# Patient Record
Sex: Female | Born: 1985 | Race: Black or African American | Hispanic: No | Marital: Single
Health system: Midwestern US, Community
[De-identification: ages and names within clinical notes are randomized; demographics above are authoritative.]

## PROBLEM LIST (undated history)

## (undated) ENCOUNTER — Ambulatory Visit: Payer: Self-pay | Source: Home / Self Care

## (undated) DIAGNOSIS — K219 Gastro-esophageal reflux disease without esophagitis: Secondary | ICD-10-CM

## (undated) DIAGNOSIS — D649 Anemia, unspecified: Secondary | ICD-10-CM

## (undated) DIAGNOSIS — O26849 Uterine size-date discrepancy, unspecified trimester: Secondary | ICD-10-CM

## (undated) DIAGNOSIS — R079 Chest pain, unspecified: Secondary | ICD-10-CM

## (undated) DIAGNOSIS — J209 Acute bronchitis, unspecified: Secondary | ICD-10-CM

## (undated) HISTORY — PX: CHOLECYSTECTOMY: SHX55

## (undated) HISTORY — PX: OTHER SURGICAL HISTORY: SHX169

---
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
## 2007-01-13 NOTE — Op Note (Signed)
[facility]                                OPERATION REPORT                        SURGEON:  JAVAID A. Birder Robson, M.D.   [facility] [name], [name]:   MR  [id number]                DATE OF SURGERY:                     01/13/2007   #:   Ashley [name]  [id number]             PT. LOCATION:                        [id number]   #   JAVAID A. Birder Robson, M.D.      DOB: December 08, 1985        AGE:21        SEX:  F   cc:    JAVAID A. Birder Robson, M.D.   PREOPERATIVE DIAGNOSIS:   Term pregnancy.  Previous cesarean section.   POSTOPERATIVE DIAGNOSIS:   Term pregnancy.  Previous cesarean section.  Pelvic adhesions.   OPERATION:   Repeat cesarean section and lysis of adhesions.   SURGEON:   Titus Mould, M.D.   ANESTHESIA:   Spinal.   PROCEDURE AND FINDINGS:  With the patient in supine position under   effective regional anesthesia, having inserted a Foley catheter, she was   prepped and draped in a sterile manner for abdominal procedure.  Through a   transverse suprapubic skin incision, the peritoneal cavity was entered and   patient was noticed to have extensive anterior abdominal wall omental   adhesions which were all carefully freed.  Bladder flap was mobilized and   through a lower uterine segment transverse incision, a live infant was   delivered with forceps in good condition.  The placental membranes were   manually removed, and the uterine cavity was cleaned.  The uterine incision   was closed in 2 layers of running interlocking 0 chromic suture securing,   adequate hemostasis.  After that, the bladder flap was reapproximated.  The   pelvis was cleaned and the abdominal wall incision was closed in layers   using running 0 chromic for the peritoneum and running interlocking 0   Vicryl for the fascia, and subcutaneous hemostasis was secured with   cautery.  Skin edges were approximated with staples.  Blood loss was   estimated at less than 300 cc.  No intraoperative complications were   encountered.  All counts were reported  correct.  A sterile dressing was   applied on the incision and patient was transferred to recovery room for   further observation and treatment with a Foley catheter draining clear   adequate urine.   Electronically Signed By:   Lily Lovings. Birder Robson, M.D. 01/26/2007 09:26   ____________________________   Jerre Simon A. Birder Robson, M.D.   Turner Daniels  D:  01/13/2007  T:  01/13/2007  1:31 P   [id number]

## 2007-01-13 NOTE — Op Note (Signed)
[facility]                                OPERATION REPORT                        SURGEON:  JAVAID A. Birder Robson, M.D.   [facility] [name], ANTHONELLA KLAUSNER:   MR  [id number]                DATE OF SURGERY:                     01/13/2007   #:   Lindley Magnus  [id number]             PT. LOCATION:                        [id number]   #   JAVAID A. Birder Robson, M.D.      DOB: 14-Nov-1985        AGE:21        SEX:  F   cc:    JAVAID A. Birder Robson, M.D.   PREOPERATIVE DIAGNOSIS:   Term pregnancy.  Previous cesarean section.   POSTOPERATIVE DIAGNOSIS:   Term pregnancy.  Previous cesarean section.  Pelvic adhesions.   OPERATION:   Repeat cesarean section and lysis of adhesions.   SURGEON:   Titus Mould, M.D.   ANESTHESIA:   Spinal.   PROCEDURE AND FINDINGS:  With the patient in supine position under   effective regional anesthesia, having inserted a Foley catheter, she was   prepped and draped in a sterile manner for abdominal procedure.  Through a   transverse suprapubic skin incision, the peritoneal cavity was entered and   patient was noticed to have extensive anterior abdominal wall omental   adhesions which were all carefully freed.  Bladder flap was mobilized and   through a lower uterine segment transverse incision, a live infant was   delivered with forceps in good condition.  The placental membranes were   manually removed, and the uterine cavity was cleaned.  The uterine incision   was closed in 2 layers of running interlocking 0 chromic suture securing,   adequate hemostasis.  After that, the bladder flap was reapproximated.  The   pelvis was cleaned and the abdominal wall incision was closed in layers   using running 0 chromic for the peritoneum and running interlocking 0   Vicryl for the fascia, and subcutaneous hemostasis was secured with   cautery.  Skin edges were approximated with staples.  Blood loss was   estimated at less than 300 cc.  No intraoperative complications were    encountered.  All counts were reported correct.  A sterile dressing was   applied on the incision and patient was transferred to recovery room for   further observation and treatment with a Foley catheter draining clear   adequate urine.   Electronically Signed By:   Lily Lovings. Birder Robson, M.D. 01/26/2007 09:26   ____________________________   Jerre Simon A. Birder Robson, M.D.   Turner Daniels  D:  01/13/2007  T:  01/13/2007  1:31 P   [id number]

## 2010-11-25 LAB — PAP SMEAR
PAP Smear, External: NEGATIVE
Pap smear, External: NEGATIVE

## 2010-11-25 LAB — N GONORRHOEAE, DNA PROBE: Gonorrhea, External: NEGATIVE

## 2010-11-25 LAB — CHLAMYDIA DNA PROBE: Chlamydia, External: NEGATIVE

## 2010-11-25 LAB — AMB POC URINE PREGNANCY TEST, VISUAL COLOR COMPARISON: HCG urine, Ql. (POC): NEGATIVE

## 2010-11-25 NOTE — Progress Notes (Signed)
Subjective:   25 y.o. female for annual routine Pap and checkup.  Patient's last menstrual period was 10/21/2010.    Social History: single partner, contraception - none.  Pertinent past medical hstory: no history of HTN, DVT, CAD, DM, liver disease, migraines or smoking.    There is no problem list on file for this patient.    Past Surgical History   Procedure Date   ??? Hx cesarean section 2002, 2004, 2008     x 3   ??? 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 No dyspnea or chest pain on exertion.  No abdominal pain, change in bowel habits, black or bloody stools.  No urinary tract symptoms. GYN ROS: normal menses, no abnormal bleeding, pelvic pain or discharge, no breast pain or new or enlarging lumps on self exam. No neurological complaints.    Objective:   BP 108/61   Pulse 81   Resp 18   Ht 5' (1.524 m)   Wt 178 lb (80.74 kg)   BMI 34.76 kg/m2   LMP 10/21/2010  The patient appears well, alert, oriented x 3, in no distress.  ENT normal.  Neck supple. No adenopathy or thyromegaly. PERLA. Lungs are clear, good air entry, no wheezes, rhonchi or rales. S1 and S2 normal, no murmurs, regular rate and rhythm. Abdomen soft without tenderness, guarding, mass or organomegaly. Extremities show no edema, normal peripheral pulses. Neurological is normal, no focal findings.    BREAST EXAM: breasts appear normal, no suspicious masses, no skin or nipple changes or axillary nodes    PELVIC EXAM: normal external genitalia, vulva, vagina, cervix, uterus and adnexa, VULVA: normal appearing vulva with no masses, tenderness or lesions, VAGINA: normal appearing vagina with normal color and discharge, no lesions, CERVIX: normal appearing cervix without discharge or lesions, no discharge noted, cervical motion tenderness absent, UTERUS: uterus is normal size, shape, consistency and nontender, ADNEXA: normal adnexa in size, nontender and no masses    Assessment/Plan:   well woman  pap smear   counseled on breast self exam, STD prevention, HIV risk factors and prevention and family planning choices  return annually or prn  1. Routine gynecological examination  PAP, IG, RFX HPV ASCUS ([id number])   2. Screening examination for venereal disease  CT, NG, TRICH VAG BY NAA   3. Missed menses  AMB POC URINE PREGNANCY TEST, VISUAL COLOR COMPARISON   The patient desires conception.  Discussed fertile days in the cycle.  Recommend confirm ovulation with ovulation kits with timed intercourse.  F/U 1 year or as needed.

## 2010-11-28 LAB — CT/NG/T.VAGINALIS AMPLIFICATION
C. trachomatis by NAA: NEGATIVE
N. gonorrhoeae by NAA: NEGATIVE
T. vaginalis by NAA: NEGATIVE

## 2010-12-03 LAB — PAP, IG, RFX HPV ASCUS (507301)
.: 0
LABCORP 019018: 0

## 2010-12-11 NOTE — ED Provider Notes (Signed)
MEDICATION ADMINISTRATION SUMMARY              Drug Name: Acetaminophen, Dose Ordered: 975 mg, Route: Oral, Status:         Given, Time: 09:21 12/11/2010, Detailed record available in Medication         Service section.       KNOWN ALLERGIES   NSAIDS       TRIAGE (Sat Dec 11, 2010 08:38 MML1)   PATIENT: NAME: Ashley [name], AGE: 25, GENDER: female, DOB:         Thu 12-05-1985, TIME OF GREET: Sat Dec 11, 2010 08:33, SSN:         [id number], KG WEIGHT: 73.9, HEIGHT: 152cm, MEDICAL RECORD NUMBER:         [phone], ACCOUNT NUMBER: [id number], PCP: Pt Denies,. (Sat Dec 11, 2010         08:38 MML1)   ADMISSION: URGENCY: 3, TRANSPORT: Ambulatory, DEPT: Emergency,         BED: 2ED 35. (Sat Dec 11, 2010 08:38 MML1)   VITAL SIGNS: BP 113/75, Pulse 76, Resp 18, Temp 98.8, (Oral),         Pain 6, O2 Sat 1000, on Room air, Time 12/11/2010 08:34. (08:34         MML1)   COMPLAINT:  [redacted] Weeks Pregnant Crampin. (Sat Dec 11, 2010 08:38         MML1)   PRESENTING COMPLAINT:  [redacted] weeks pregnant with vaginal bldg,         started 2 days ago, cramps getting worse. (Sat Dec 11, 2010 08:38         MML1)   PAIN: Patient complains of pain, On a scale 0-10 patient rates         pain as 6, cramping, Pain is constant. (Sat Dec 11, 2010 08:38         MML1)   LMP: Last menstrual period: 09-27-2010, Estimated conception         10/11/2010, Estimated [name] date 07/04/2011, Estimated fetal age 61         weeks, 5 days, G: 4, P: 3. (Sat Dec 11, 2010 08:38 MML1)   TB SCREENING: TB screen negative for this patient. (Sat Dec 11, 2010 08:38 MML1)   ABUSE SCREENING: Patient denies physical abuse or threats. (Sat         Dec 11, 2010 08:38 MML1)   FALL RISK: Fall risk assessment not applicable to this patient.         (Sat Dec 11, 2010 08:38 MML1)   SUICIDAL IDEATION: Not Applicable. (Sat Dec 11, 2010 08:38         MML1)   ADVANCE DIRECTIVES: Patient does not have advance directives.         (Sat Dec 11, 2010 08:38 MML1)    PROVIDERS: TRIAGE NURSE: Lanny Hurst, RN. (Sat Dec 11, 2010         08:38 MML1)       PRESENTING PROBLEM (Sat Dec 11, 2010 08:38 MML1)      Presenting problems: Vaginal Bleed - Pregnant.       CURRENT MEDICATIONS (08:39 MML1)   Prenatal Multivitamin:  1 tab(s) Oral once a day.   Multivitamin:  1 tab(s) Oral once a day.       MEDICATION SERVICE (09:21 BAF)   Acetaminophen:  Order: Acetaminophen - Dose: 975 mg :  Oral         Ordered by: Dixie Dials, MD         Entered by: Dixie Dials, MD Sat Dec 11, 2010 09:16 ,          Acknowledged by: Clent Demark, RN Sat Dec 11, 2010 09:18         Documented as given by: Clent Demark, RN Sat Dec 11, 2010 09:21          Patient, Medication, Dose, Route and Time verified prior to         administration.          Time given: 0920, Amount given: 975mg , Site: Medication administered         P.O., Correct patient, time, route, dose and medication confirmed         prior to administration, Patient advised of actions and side-effects         prior to administration, Allergies confirmed and medications reviewed         prior to administration, Patient tolerated procedure well, Patient in         position of comfort, Side rails up, Cart in lowest position, Family         at bedside, Call light in reach.       ORDERS   Urine HCG:  Ordered for: Carmela Hurt, MD, Ben         Status: Done by Ronnell Guadalajara RN, [facility] Dec 11, 2010 09:21. (08:42         [facility])   Urine dip (send for lab U/A if positive):  Ordered for:         Carmela Hurt, MD, Romeo Apple         Status: Done by Ronnell Guadalajara RN, [facility] Dec 11, 2010 09:21. (08:42         [facility])   ULT UTERUS OB TRANSVAGINAL:  Ordered for: Carmela Hurt, MD, Ben         Status: Active. (09:16 BAF)   PREGNANCY, QUANTITATIVE:  Ordered for: Carmela Hurt, MD, Ben         Status: Done by System Sat Dec 11, 2010 10:36. (09:16 BAF)   Ultrasound has been ordered:  Ordered for: Carmela Hurt, MD, Romeo Apple          Status: Done by Skip Estimable Sat Dec 11, 2010 09:23. (09:16         BAF)       NURSING ASSESSMENT: GENITOURINARY (09:16 MAK1)   CONSTITUTIONAL: History obtained from patient, Patient arrives         ambulatory, Gait steady, Patient appears comfortable, Patient         cooperative, Patient alert, Oriented to person, place and time, Skin         warm, Skin dry, Skin normal in color, Mucous membranes pink, Mucous         membranes moist, Patient is well-groomed.   PAIN FEMALE: cramping pain, to the suprapubic region, constant,         on a scale 0-10 patient rates pain as 6.   GENITOURINARY FEMALE: Female genitourinary assessment findings         include external genitalia normal, Pregnant, per patient, Gestational         age 13-12 weeks by history, Last menstrual period started on         09/27/2010 09:16, Gravida: 4, Para: 3, Elective abortions: 0,         Spontaneous abortions: 0.   ABDOMEN: Abdomen assessment findings include  abdomen symmetrical,         Abdomen soft, Associated with nausea.   SAFETY: Side rails up, Cart/Stretcher in lowest position, Call         light within reach, Hospital ID band on.       NURSING ASSESSMENT: NURSES NOTE (11:00 MAK1)   TIME ASSESSED:  Patient in no apparent distress, Patient resting         quietly, Patient alert and oriented, breathing regular and unlabored,         skin warm and dry. pt denies needs at this time. will continue to         monitor.       NURSING PROCEDURE: BEDSIDE TESTING (09:19 MCT1)   PATIENT IDENTIFIER: Patient's identity verified by patient         stating name, Patient's identity verified by patient stating birth         date, Patient's identity verified by hospital ID bracelet.   PREGNANCY TEST: Pregnancy test indicated to document pregnancy,         Pregnancy test indicated to document pregnancy status prior to         procedures, Urine pregnancy test, positive, Quality control line         positive.        NURSING PROCEDURE: DISCHARGE NOTE (13:01 MAK1)   TIME:  Patient discharged to, home, ambulates without assistance,         Transported via friend/family driving, Accompanied by family member,         patient, Simple/moderate discharge teaching performed, Patient,         Discharge instructions given to, Above Person(s) verbalized         understanding of discharge instructions and follow-up care.       NURSING PROCEDURE: LAB DRAW (09:42 MCT1)   PATIENT IDENTIFIER: Patient's identity verified by patient         stating name, Patient's identity verified by patient stating birth         date, Patient's identity verified by hospital ID bracelet, Patient's         identity verified by family member, Patient actively involved in         identification process.   LAB DRAW: Lab draw indicated for obtaining specimens for         evaluation, Initial lab draw performed, by venipuncture, from right         antecubital, in one attempt, Labs were drawn at 0942, Lab specimens         labeled in the presence of the patient and sent to lab, Tourniquet         removed from patient after procedure.   FOLLOW-UP: After procedure, dressing applied to site, After         procedure, no swelling at site, After procedure, no active bleeding         from site.   NOTES: Patient tolerated procedure well.   SAFETY: Side rails up, Cart/Stretcher in lowest position, Family         at bedside, Call light within reach, Hospital ID band on.       NURSING PROCEDURE: NURSE NOTES (12:14 LEC1)   NURSES NOTES: Patient in no apparent distress, Patient resting         quietly, Notes: Patient denies needs or concerns. Patient sitting in         bedside chair. Patient's children sitting on bed.  NURSING PROCEDURE: TRANSPORT TO TESTS   PATIENT IDENTIFIER: Patient's identity verified by patient         stating name, Patient's identity verified by patient stating birth         date, Patient's identity verified by hospital ID bracelet. (11:14         MAK1)    TRANSPORT TO TESTS: Transport indicated to facilitate diagnosis,         Patient transported to ultrasound, via cart, Accompanied by x-ray         technician. (11:14 MAK1)   FOLLOW-UP: After procedure, patient returned to emergency         department. (11:39 MAK1)   SAFETY: Side rails up, Cart/Stretcher in lowest position, Family         at bedside, Call light within reach, Hospital ID band on. (11:14         MAK1)       DIAGNOSIS (12:34 BAF)   FINAL: PRIMARY: Threatened miscarriage.       DISPOSITION   PATIENT:  Disposition Type: Discharged, Disposition: Discharged,         Condition: Stable. (12:34 BAF)      Patient left the department. (13:03 MAK1)       INSTRUCTION (12:35 BAF)   DISCHARGE:  ECTOPIC PREGNANCY, SUSPECTED - W/ REPEAT QUANT AND         ULTRASOUND (TUBAL PREGNANCY).   FOLLOWUP:  Val Eagle, , MEDICINE.   SPECIAL:  Please call your ob/gyn on Monday morning. They have         agreed to see you on Monday and will repeat your labwork then. Dr.         Andi Hence and I have spoken and he understands the situation. Return for         increased pain, heavy vaginal bleeding, or other concerns.   Key:     BAF=Fickenscher, MD, Romeo Apple  JOHO=Hubbard, PA-C, Amil Amen  LEC1=Cutchins, RN,     Raliegh Scarlet     MAK1=Knice, RN, Marcelino Duster  MCT1=Todd, ACT III, Casimiro Needle  MML1=Lopez, RN,     [facility]

## 2010-12-11 NOTE — ED Provider Notes (Signed)
[facility]   EMERGENCY DEPARTMENT TREATMENT REPORT   NAME:  Ashley [name]   SEX:   F   ADMIT: 12/11/2010   DOB:   12/23/1985   MR#    [id number]   ROOM:     TIME SEEN: 12 43 PM   ACCT#  [id number]               COMPLAINT:   Vaginal bleeding and abdominal cramping.       HISTORY OF PRESENT ILLNESS:   A 25 year old female who is 10 weeks 5 days by dates.  She states that    yesterday she started having spotting coupled with bilateral abdominal    cramping.  Its pain as a 6 out of 10.  It is intermittent cramping sensation.     She has not taken any medicine for it.  She has had no passage of clots or    heavy bleeding.  She states also that she has had a decrease in her symptoms    of pregnancy.  She called her doctor's office, Dr. Doyne Keel who indicated she    should come to the Emergency Department.       REVIEW OF SYSTEMS:   PULMONARY:  No shortness of breath or cough.   CARDIOVASCULAR:  No chest pain.   GENITOURINARY:  No dysuria.       PAST MEDICAL HISTORY:   None.       SOCIAL HISTORY:   No alcohol, tobacco or drug use.       MEDICATIONS:   Please see [facility].       ALLERGIES:   NSAIDs.       PHYSICAL EXAMINATION:   VITAL SIGNS:  Blood pressure 118/67, pulse 71, respiratory rate 18,    temperature 98.8, O2 sat 100% on room air.   GENERAL APPEARANCE:  Patient appears well developed and well nourished.     Appearance and behavior are age and situation appropriate.   RESPIRATORY:  Clear and equal breath sounds.  No respiratory distress,    tachypnea, or accessory muscle use.     CARDIOVASCULAR:   Heart regular, without murmurs, gallops, rubs, or thrills.         GI:  Her abdomen is soft and nontender throughout:     Pelvic exam:  She has no uterine tenderness, no adnexal tenderness.   SKIN:  Warm and dry.       INITIAL ASSESSMENT:   A patient with a bleeding and pain in early pregnancy.  Differential would    include normal pregnancy with uterine irritability versus threatened     miscarriage, ectopic pregnancy.   This is a new problem for this patient.    Old records were reviewed.  No additional relevant information was obtained.   Bedside ultrasound shows no identifiable fetal pole but a gestational sac.     She was sent for formal ultrasound.  Ultrasound formal shows 1.6 cm    hyperechoic left ovarian mass with possible ectopic pregnancy and 6.2 weeks'    gestational sac without intrauterine fetus.  Beta hCG is 10,345.  Urinalysis    is negative for signs of infection.       MEDICAL DECISION MAKING AND HOSPITAL COURSE:   The patient was given oral Tylenol.  Her pain came down to 1.  I have had    multiple discussions with her.  I spoke to Dr. Lysle Dingwall, on call for her OB/GYN.     He feels that  it is safe for her to go home and commits to seeing the patient    on Monday for repeat beta hCG testing and/or repeat imaging.  I have    confirmed that her blood type is A positive.  I have sent her with a copy of    her labs.  She understand to return immediately for heavy bleeding, increased    pain or other concerns.       DISPOSITION:   Home in stable condition.       DIAGNOSES:   1. Acute abdominal pain.   2. Threatened miscarriage   3. Possible ectopic pregnancy.           ___________________   Christiana Pellant MD   Dictated By: Marland Kitchen        My signature above authenticates this document and my orders, the final    diagnosis (es), discharge prescription (s), and instructions in the [facility]    Pulsecheck record.   CL   D:12/11/2010   T: 12/11/2010 17:03:05   [id number]

## 2010-12-11 NOTE — Telephone Encounter (Signed)
paged 12/11/10 at 12:27 PM "Dr thicker at [facility] er re pt jenette Bubel 10 wks"   "Cramping, no bleeding, 6 week IUP (sac, no fetal pole), QBHCG 10,000, Rh+."    Recommended bed/pelvic rest, call office for appointment; go to nearest hospital for bleeding.

## 2010-12-17 DIAGNOSIS — O34219 Maternal care for unspecified type scar from previous cesarean delivery: Secondary | ICD-10-CM | POA: Insufficient documentation

## 2010-12-20 MED ORDER — FOLIC ACID 1 MG TAB
1 mg | ORAL_TABLET | Freq: Every day | ORAL | Status: DC
Start: 2010-12-20 — End: 2016-08-24

## 2010-12-20 MED ORDER — FERROUS SULFATE 325 MG (65 MG ELEMENTAL IRON) TAB
325 mg (65 mg iron) | ORAL_TABLET | Freq: Every day | ORAL | Status: DC
Start: 2010-12-20 — End: 2015-01-07

## 2010-12-20 MED ORDER — ONDANSETRON 4 MG TAB, RAPID DISSOLVE
4 mg | ORAL_TABLET | Freq: Three times a day (TID) | ORAL | Status: DC | PRN
Start: 2010-12-20 — End: 2016-07-26

## 2010-12-20 NOTE — Progress Notes (Signed)
25 y.o. Patient's last menstrual period was 10/21/2010. G4   P3003      Chief Complaint   Patient presents with   ??? Initial Prenatal Visit    C/S x 3    Denies pain/spotting. +Hyperemesis  Seen at [facility] : Pt reports very early IUP with QBHCG 10,000 on 11/10/10 :Request Records    No past medical history on file.   Past Surgical History   Procedure Date   ??? Hx cesarean section 2002, 2004, 2008     x 3   ??? Hx gastric bypass 10-2009     [facility]      Past Surgical History   Procedure Date   ??? Hx cesarean section 2002, 2004, 2008     x 3   ??? Hx gastric bypass 10-2009     [facility]     No past medical history on file.  Sickle Cell trait.    Consort 25 yo healthy & healthy -no Fhx sickle or or inherited illness  OB History     Grav Para Term Preterm Abortions TAB SAB Ect Mult Living    4 3 3       3       C/S x 3 1st for fetal intolerance of labor & 2 repeat.     Allergies   Allergen Reactions   ??? Aspirin Other (comments)     Stomach ulcer   ??? Ibuprofen Unknown (comments)     Gastric Bypass--advised to avoid     .  Current Outpatient Prescriptions on File Prior to Visit   Medication Sig Dispense Refill   ??? multivitamin (ONE A DAY) tablet Take 1 Tab by mouth daily.         ??? ERGOCALCIFEROL, VITAMIN D2, (VITAMIN D2 PO) Take  by mouth.         ??? cyanocobalamin (B-12 DOTS) 500 mcg tablet Take 500 mcg by mouth daily.            Gardasil Positive first 2 of series  indicated that her mother is alive. She indicated that her father is alive. She indicated that her sister is alive. She indicated that her brother is alive. She indicated that her maternal grandmother is alive. She indicated that her maternal grandfather is deceased. She indicated that her paternal grandmother is deceased. She indicated that her paternal grandfather is deceased.    GYN History   Menarche 8 years X  28 days X 5 days        History     Social History   ??? Marital Status: Single     Spouse Name: N/A     Number of Children: N/A    ??? Years of Education: N/A     Occupational History   ??? Not on file.     Social History Main Topics   ??? Smoking status: Former Smoker     Types: Cigarettes     Quit date: 11/12/2009   ??? Smokeless tobacco: Never Used   ??? Alcohol Use: No   ??? Drug Use: No   ??? Sexually Active: Yes -- Female partner(s)     Birth Control/ Protection: None     Other Topics Concern   ??? Not on file     Social History Narrative   ??? No narrative on file     coitarche 15 X 6 single partner, contraception - none.  Monogamous Yes X 1 year    Negative Herpes Negative GC Positive Chlamydia  Tx'd at 25 yo, Negative T. Vaginalis Negative Syphilis    Fertility Treatment(s) Negative    Prior Pap  Approximate date, Result:normal   Previous Biopsy No  Treatment: NA  BP 110/62   Pulse 66   Resp 18   Ht 5' (1.524 m)   Wt 174 lb (78.926 kg)   BMI 33.98 kg/m2   LMP 08/09/2012Recent exam (3weeks ago) Exam deferred.    1. Supervision of other normal pregnancy  AMB POC URINE PREGNANCY TEST, VISUAL COLOR COMPARISON, HIV 1/O/2 AB WITH CONFIRMATION, HEP B SURFACE AG, CBC WITH AUTOMATED DIFF, RH+ABO+AB SCR, RUBELLA AB, IGG, RPR, HEMOGLOBIN FRACTIONATION, VITAMIN D, 25 HYDROXY, CULTURE, URINE   2. Positive pregnancy test  TOTAL HCG, QT.       Schedule dating OB US  Seen at [facility] : Pt reports very early IUP with QBHCG 10,000 on 11/10/10 :Request Records  See MD 4 weeks  Orders Placed This Encounter   ??? CULTURE, URINE   ??? N GONORRHOEAE DNA PROBE   ??? POC TA OB US < 14 WEEKS   ??? HIV 1/O/2 AB WITH CONFIRMATION   ??? HEP B SURFACE AG   ??? CBC WITH AUTOMATED DIFF   ??? RH+ABO+AB SCR   ??? RUBELLA AB, IGG   ??? RPR   ??? HEMOGLOBIN FRACTIONATION   ??? VITAMIN D, 25 HYDROXY   ??? TOTAL HCG, QT.   ??? CHLAMYDIA DNA PROBE   ??? AMB POC URINE PREGNANCY TEST, VISUAL COLOR COMPARISON   ??? prenatal vit-iron fumarate-fa 28-0.8 mg Tab   ??? ferrous sulfate (IRON, FERROUS SULFATE,) 325 mg (65 mg iron) tablet   ??? folic acid (FOLVITE) 1 mg tablet    ??? ondansetron (ZOFRAN ODT) 4 mg disintegrating tablet   ??? PAP SMEAR                         Entry Date       12/03/2010          Component Results       Diagnosis:     Comment    Comment:     NEGATIVE FOR INTRAEPITHELIAL LESION AND MALIGNANCY.  THIS SPECIMEN WAS RESCREENED AS PART OF OUR QUALITY CONTROL PROGRAM.     Value Range & Units Status Chlamydia by NAA Negative Negative Final Gonococcus by NAA Negative Negative Final TRICH VAG BY NAA Negative Negative Final Performed At: [facility]

## 2010-12-20 NOTE — Progress Notes (Signed)
C/S # 3 Op Report also to be requested from [facility].

## 2010-12-20 NOTE — Patient Instructions (Signed)
Extreme Nausea and Vomiting in Pregnancy: After Your Visit  Your Care Instructions  Nausea and vomiting (often called morning sickness) are common in pregnancy. They are caused by pregnancy hormones and happen most often in the first 3 months. Some women get very sick and are not able to keep down food and fluids. This extreme morning sickness is called hyperemesis gravidarum. It can lead to a dangerous loss of fluids in the body. It also can keep you from gaining weight and getting proper nutrition during your pregnancy.  Your body fluids are put back in balance with water and minerals called electrolytes. Medicine may help if you have severe nausea and vomiting.  Follow-up care is a key part of your treatment and safety. Be sure to make and go to all appointments, and call your doctor if you are having problems. It???s also a good idea to know your test results and keep a list of the medicines you take.  How can you care for yourself at home?  ?? Take your medicines exactly as prescribed. Call your doctor if you think you are having a problem with your medicine.  ?? Drink plenty of fluids to prevent dehydration. Choose water and other caffeine-free clear liquids until you feel better. Try sipping on sports drinks that have salt and sugar in them.  ?? Eat a small snack, such as crackers, before you get out of bed. Wait a few minutes, then get out of bed slowly.  ?? Keep food in your stomach, but not too much at once. An empty stomach can make nausea worse. Eat several small meals every day instead of three large meals.  ?? Eat more protein and less fat.  ?? Get plenty of vitamin B6 by eating whole grains, nuts, seeds, and legumes. You can take vitamin B6 tablets if your doctor says it is okay.  ?? Try to avoid smells and foods that make you feel sick to your stomach.  ?? Get lots of rest.   ?? You may want to try acupressure bands. They put pressure on an acupressure point in the wrist. Some women feel better using the bands.  ?? Ginger may also help you feel better. You can use it in tea, take it as a pill, or use a ginger syrup that you can buy at a health food store.  When should you call for help?  Call 911 anytime you think you may need emergency care. For example, call if:  ?? You passed out (lost consciousness).  Call your doctor now or seek immediate medical care if:  ?? You vomit more than 3 times in a day, especially if you also have a fever or pain.  ?? You are too sick to your stomach to drink any fluids.  ?? You have signs of needing more fluids. You have sunken eyes and a dry mouth, and you pass only a little dark urine.  ?? Your morning sickness gets worse or does not get better with home care.  ?? You are not able to keep down your medicine.  Watch closely for changes in your health, and be sure to contact your doctor if you have any problems.   Where can you learn more?   Go to [employer]  Enter [phone] in the search box to learn more about "Extreme Nausea and Vomiting in Pregnancy: After Your Visit."   ?? 2006-2012 [employer]. Care instructions adapted under license by [employer] (which disclaims liability or warranty for this information).  This care instruction is for use with your licensed healthcare professional. If you have questions about a medical condition or this instruction, always ask your healthcare professional. [employer], Inc[employer] disclaims any warranty or liability for your use of this information.  Content Version: 9.4.94723; Last Revised: December 25, 2009            Nutrition During Pregnancy: After Your Visit  Your Care Instructions   Healthy eating when you are pregnant is important for you and your baby. It can help you feel well and have a successful pregnancy and delivery. During pregnancy your nutrition needs increase. Even if you have excellent eating habits, your doctor may recommend a multivitamin to make sure you get enough iron and folic acid.  Many pregnant women wonder how much weight they should gain. In general, women who were at a healthy weight before they became pregnant should gain between 25 and 35 pounds. Women who were overweight before pregnancy are usually advised to gain 15 to 25 pounds. Women who were underweight before pregnancy are usually advised to gain 28 to 40 pounds. Your doctor will work with you to set a weight goal that is right for you. Gaining a healthy amount of weight helps you have a healthy baby.  Follow-up care is a key part of your treatment and safety. Be sure to make and go to all appointments, and call your doctor if you are having problems. It???s also a good idea to know your test results and keep a list of the medicines you take.  How can you care for yourself at home?  ?? Eat plenty of fruits and vegetables. Include a variety of orange, yellow, and leafy dark-green vegetables every day.  ?? Choose whole-grain bread, cereal, and pasta. Good choices include whole wheat bread, whole wheat pasta, brown rice, and oatmeal.  ?? Get 4 or more servings of milk and milk products each day. Good choices include nonfat or low-fat milk, yogurt, and cheese. If you cannot eat milk products, you can get calcium from calcium-fortified products such as orange juice, soy milk, and tofu. Other non-milk sources of calcium include leafy green vegetables, such as broccoli, kale, mustard greens, turnip greens, bok choy, and brussels sprouts.  ?? If you eat meat, pick lower-fat types. Good choices include lean cuts of meat and chicken or [place] without the skin.   ?? Do not eat shark, swordfish, king mackerel, tilefish, or albacore tuna. They have high levels of mercury, which is dangerous to your baby. You can eat up to 12 ounces a week of fish or shellfish that have low mercury levels. Good choices include shrimp, canned light tuna, wild salmon, pollack, and catfish.  ?? Heat lunch meats (such as [place], ham, or bologna) to 165??F before you eat them. This reduces your risk of getting sick from a kind of bacteria that can be found in lunch meats.  ?? Do not eat unpasteurized soft cheeses, such as brie, feta, fresh mozzarella, and blue cheese. They have a bacteria that could harm your baby.  ?? Limit caffeine. If you drink coffee or tea, have no more than 1 cup a day. Caffeine is also found in colas.  ?? Do not drink any alcohol. No amount of alcohol has been found to be safe during pregnancy.  ?? Do not diet or try to lose weight. For example, do not follow a low-carbohydrate diet. If you are overweight at the start of your pregnancy, your doctor will work with you  to manage your weight gain.  ?? Tell your doctor about all vitamins and supplements you take.  When should you call for help?  Watch closely for changes in your health, and be sure to contact your doctor if you have any problems.   Where can you learn more?   Go to [employer]  Enter Y785 in the search box to learn more about "Nutrition During Pregnancy: After Your Visit."   ?? 2006-2012 [employer]wise, [employer]. Care instructions adapted under license by [employer] (which disclaims liability or warranty for this information). This care instruction is for use with your licensed healthcare professional. If you have questions about a medical condition or this instruction, always ask your healthcare professional. [employer]wise, Inc[employer] disclaims any warranty or liability for your use of this information.  Content Version: 9.4.94723; Last Revised: June 01, 2010

## 2010-12-21 LAB — HEMOGLOBIN FRACTIONATION
HEMOGLOBIN A2: 2.4 % (ref 0.7–3.1)
HEMOGLOBIN F: 0 % (ref 0.0–2.0)
HEMOGLOBIN S: 0 %
HGB A: 97.6 % (ref 94.0–98.0)
HGB SOLUBILITY: NEGATIVE
Hemoglobin A2: 2.4 % (ref 0.7–3.1)
Hemoglobin A: 97.6 % (ref 94.0–98.0)
Hemoglobin C: 0 %
Hemoglobin C: 0 %
Hemoglobin F: 0 % (ref 0.0–2.0)
Hemoglobin S: 0 %
Hgb Solubility: NEGATIVE

## 2010-12-21 LAB — HIV 1/O/2 AB WITH CONFIRMATION
HIV 1/O/2 Abs, QL: NONREACTIVE
HIV 1/O/2 Abs, Qual: NONREACTIVE
HIV 1/O/2 Abs-Index Value: <1 (ref <1.00)
HIV 1/O/2 Abs: <1 (ref <1.00)

## 2010-12-21 LAB — RH+ABO+AB SCR
Antibody Screen: NEGATIVE
Antibody screen: NEGATIVE
Rh (D): POSITIVE
Rh Type: POSITIVE

## 2010-12-21 LAB — RPR
RPR: NONREACTIVE
RPR: NONREACTIVE

## 2010-12-21 LAB — CULTURE, URINE

## 2010-12-21 LAB — BETA HCG, QT
hCG,Beta Subunit,Qnt,Serum: 52961 m[IU]/mL
hCG,Beta Subunit,Qt.: 52961 m[IU]/mL

## 2010-12-21 LAB — CBC WITH AUTOMATED DIFF
ABS. BASOPHILS: 0 10*3/uL (ref 0.0–0.2)
ABS. EOSINOPHILS: 0 10*3/uL (ref 0.0–0.4)
ABS. IMM. GRANS.: 0 10*3/uL (ref 0.0–0.1)
ABS. MONOCYTES: 0.5 10*3/uL (ref 0.1–1.0)
ABS. NEUTROPHILS: 3.5 10*3/uL (ref 1.8–7.8)
Abs Lymphocytes: 2.8 10*3/uL (ref 0.7–4.5)
BASOPHILS: 0 % (ref 0–3)
EOSINOPHILS: 0 % (ref 0–7)
HCT: 36.1 % (ref 34.0–46.6)
HGB: 12 g/dL (ref 11.1–15.9)
IMMATURE GRANULOCYTES: 0 % (ref 0–2)
Lymphocytes: 41 % (ref 14–46)
MCH: 30.2 pg (ref 26.6–33.0)
MCHC: 33.2 g/dL (ref 31.5–35.7)
MCV: 91 fL (ref 79–97)
MONOCYTES: 7 % (ref 4–13)
NEUTROPHILS: 52 % (ref 40–74)
PLATELET: 272 10*3/uL (ref 140–415)
RBC: 3.98 x10E6/uL (ref 3.77–5.28)
RDW: 12.5 % (ref 12.3–15.4)
WBC: 6.8 10*3/uL (ref 4.0–10.5)

## 2010-12-21 LAB — PLEASE NOTE

## 2010-12-21 LAB — VITAMIN D, 25 HYDROXY: VITAMIN D, 25-HYDROXY: 27.6 ng/mL — ABNORMAL LOW (ref 30.0–100.0)

## 2010-12-21 LAB — HEP B SURFACE AG: Hep B surface Ag screen: NEGATIVE

## 2010-12-21 LAB — RUBELLA AB, IGG: Rubella Ab, IgG: 72 IU/mL

## 2010-12-21 NOTE — Progress Notes (Signed)
Quick Note:    OTC Vit D 2-3x/week in addition to PNV/FE.  IUP demonstrated at [facility]: dating [place] pending through our office.  ______

## 2010-12-29 ENCOUNTER — Encounter

## 2012-12-10 NOTE — ED Provider Notes (Signed)
[facility]  EMERGENCY DEPARTMENT TREATMENT REPORT  NAME:  Ashley [name]  SEX:   F  ADMIT: 12/10/2012  DOB:   03-Oct-1985  MR#    [id number]  ROOM:    TIME DICTATED: 11 25 PM  ACCT#  [id number]        DATE AND TIME OF EVALUATION:  Monday, 12/10/2012, 2158.    CHIEF COMPLAINT:  Cough, fever.    HISTORY OF PRESENT ILLNESS:  This 27 year old female who presents with 2 weeks of cough that has been   getting worse.  She has not taken anything for this, has not been seen by any   doctor for the symptoms. She said that she has had a temperature of 102.4 as   well as a headache.  She has also had some bilateral leg swelling. Said she   does have chest tightness and burning in her chest when she coughs, but she   has no pleuritic chest pain.  She does not have a history of asthma.    REVIEW OF SYSTEMS:  CONSTITUTIONAL:  Fever, but no chills.  ENT: No sore throat, runny nose or other URI symptoms.   HEMATOLOGIC AND LYMPHATIC:  No excessive bruising or lymph node swelling.    RESPIRATORY:  Cough, shortness of breath, but no wheezing.  CARDIOVASCULAR:  Chest tightness and burning, but no palpitations.    GASTROINTESTINAL:  No vomiting, diarrhea, or abdominal pain.    MUSCULOSKELETAL:  No joint pain or swelling.   INTEGUMENTARY:  No rashes.   NEUROLOGIC:  Headaches, now resolved.  No sensory or motor deficits.    PAST MEDICAL HISTORY:  None.    SURGICAL HISTORY:  C-section and gastric bypass.    SOCIAL HISTORY:   The patient quit smoking cigarettes less than 10 years ago.  Denies alcohol or   drug abuse.    MEDICATIONS:  Reviewed in Ibex.     ALLERGIES:     REVIEWED IN IBEX.     PHYSICAL EXAMINATION:  GENERAL APPEARANCE:  Well-developed, well-nourished female lying on exam table   in no acute distress.  VITAL SIGNS:  Blood pressure is 123/71, pulse 78, respirations 20, temperature   98.7, O2 saturation 98% on room air.  HEENT:  Eyes:  Conjunctivae clear, lids normal.  Pupils equal, symmetrical,    and normally reactive. Mouth/Throat:  Surfaces of the pharynx, palate, and   tongue are pink, moist, and without lesions.   NECK:  Supple, nontender, symmetrical, no masses or JVD, trachea midline.    Thyroid not enlarged, nodular or tender.   LYMPHATICS:  No cervical or submandibular lymphadenopathy palpated.   RESPIRATORY: Lungs with significant wheezing and rhonchi throughout.  CARDIOVASCULAR:  Heart regular, without murmurs, gallops, rubs, or thrills.   GASTROINTESTINAL:  Abdomen soft, nontender, without complaint of pain to   palpation.  No hepatomegaly or splenomegaly.    MUSCULOSKELETAL:   Nails:  No clubbing or deformities.  Nail beds pink with   prompt capillary refill.  No peripheral edema noted bilaterally.  SKIN:  Warm and dry without rashes.   NEUROLOGIC:  Alert, oriented.  Sensation intact, motor strength equal and   symmetric.     CONTINUATION BY SARAH GREGORY, PA-C:    ASSESSMENT AND MANAGEMENT PLAN:   This 27 year old female without a history of asthma has been coughing for 2   weeks and had some fevers.  The patient sounds very rhonchorous on   auscultation.  Will do a chest x-ray due to the  fever and the cough.  Will   also give nebulizer treatments in the Emergency Department along with 60 mg   p.o. steroids.      DIAGNOSTIC TEST RESULTS:  Chest x-ray read as a possible infiltrate in the right middle lobe by Dr.   Delton See.  Due to the fact this patient has had this cough with fever and a   suspicious x-ray, will start the patient on Z-Pak.  Will send him home with   steroids and albuterol inhaler.      EMERGENCY DEPARTMENT COURSE:   The patient remained stable throughout her stay in the Emergency Department.    DIAGNOSIS:  Pneumonia, bacterial.    DISPOSITION:  The patient is dispositioned home in stable condition with prescription for   the azithromycin as well as Proventil, albuterol inhaler and steroids.  Follow   up with primary care physician.  Return to the ED for any new or worsening    symptoms.  The patient was personally evaluated by myself and Dr. Delton See who   agrees with the above assessment and plan.      ___________________  Gwenyth Allegra MD  Dictated By: Thea Silversmith, PA-C    My signature above authenticates this document and my orders, the final  diagnosis (es), discharge prescription (s), and instructions in the [facility]   Pul[facility]ck record.  Nursing notes have been reviewed by the physician/mid-level provider.    If you have any questions please contact [phone].    JM  D:12/10/2012 23:25:07  T: 12/10/2012 23:55:56  [id number]  Authenticated by Gwenyth Allegra, M.D. On 12/30/2012 02:17:35 AM

## 2012-12-11 LAB — POC HCG,URINE: HCG urine, QL: NEGATIVE

## 2012-12-16 LAB — POC URINE MACROSCOPIC
Bilirubin: NEGATIVE
Blood: NEGATIVE
Glucose: NEGATIVE mg/dl
Ketone: NEGATIVE mg/dl
Leukocyte Esterase: NEGATIVE
Nitrites: NEGATIVE
Protein: NEGATIVE mg/dl
Specific gravity: 1.02 (ref 1.005–1.030)
Urobilinogen: 0.2 EU/dl (ref 0.0–1.0)
pH (UA): 7 (ref 5–9)

## 2012-12-16 LAB — POC HCG,URINE: HCG urine, QL: NEGATIVE

## 2012-12-16 NOTE — ED Provider Notes (Signed)
[facility]  EMERGENCY DEPARTMENT TREATMENT REPORT  NAME:  Ashley [name]  SEX:   F  ADMIT: 12/16/2012  DOB:   May 17, 1985  MR#    [id number]  ROOM:    TIME DICTATED: 02 11 PM  ACCT#  [id number]        CHIEF COMPLAINT:  Cough, weakness.    HISTORY OF PRESENT ILLNESS:  This is a 27 year old female who presents to the Emergency Department with   complaints of cough and wheezing.  The patient was diagnosed with pneumonia on   09/29, seven days ago.  She was given a Z-Pak.  She states that even with the   Z-Pak she is still having cough that is intermittently productive along with   yellow sputum.  She states that she was seen, given a Z-Pak, and given   prednisone with little relief.  She denies any recordable fevers along with   the symptoms.    REVIEW OF SYSTEMS:  CONSTITUTIONAL:  Denies fever or chills.  EYES:  Denies visual complaints.  ENT:  Denies sore throat.  RESPIRATORY:  Cough.  CARDIOVASCULAR:  Denies chest pain.  GASTROINTESTINAL:  Denies nausea, vomiting.  MUSCULOSKELETAL:  Denies joint pain.  INTEGUMENTARY:  Denies rashes.    PAST MEDICAL HISTORY:  Gastric bypass, cesarean section.    SOCIAL HISTORY:  Prior history of tobacco use.    FAMILY HISTORY:  Noncontributory      MEDICATIONS:   Proventil.     ALLERGIES:  ASPIRIN, MOTRIN.    PHYSICAL EXAMINATION:  VITAL SIGNS:  Blood pressure 130/91, pulse 96, respirations 18, temperature   97.7, pain 8, O2 saturation 100% on room air.  GENERAL APPEARANCE:  This is a well-developed, well-nourished female who   appears to be in no acute respiratory distress, speaking in full sentences.   EYES:  Conjunctivae clear, lids normal.  Pupils equal, symmetrical, and   normally reactive.  EARS/NOSE:  Hearing is grossly intact to voice.  Internal and external   examinations of the ears and nose are unremarkable.   MOUTH/THROAT:  Surfaces of the pharynx, palate, and tongue are pink, moist,   and without lesions.     NECK:  Supple, nontender, symmetrical, no masses or JVD, trachea midline,   thyroid not enlarged, nodular, or tender.   LYMPHATICS:  No cervical or submandibular lymphadenopathy palpated.   RESPIRATORY:  The patient has occasional expiratory wheezing.  No respiratory   distress, tachypnea, or accessory muscle use.   CARDIOVASCULAR:   Heart regular, without murmurs, gallops, rubs, or thrills.   CHEST:  Chest symmetrical without masses or tenderness.   GASTROINTESTINAL:  Abdomen is soft and nontender without complaint of pain to   palpation.  No hepatomegaly or splenomegaly.   SKIN:  Warm and dry without rashes.     INITIAL ASSESSMENT AND MANAGEMENT PLAN:  This is a 27 year old who presents with a little wheeze.  We will order a   chest x-ray to repeat and compare against the initial.      RESULTS OF DIAGNOSTIC STUDIES:   Urine is negative for leukocytes, nitrites, and negative for blood.  HCG is   negative.  Chest x-ray is read negative when compared with the prior x-ray as   read by Dr. Elvia Collum.       FINAL DIAGNOSIS:  Pneumonia, improving.    DISPOSITION:  The patient discharged stable to home.  She was given a prescription for   Cheratussin that she is to take.  Follow up  with primary care.  Returning to   the Emergency Department if symptoms persist or worsen.  The patient   verbalized understanding.      The patient was personally evaluated by myself and Dr. Victorino Dike Himmel-Nicky Milhouse   who agrees with the above assessment and plan.      ___________________  Liberty Handy Himmel-Bellami Farrelly DO  Dictated By: Shireen Quan, PA-C    My signature above authenticates this document and my orders, the final  diagnosis (es), discharge prescription (s), and instructions in the [facility]   Pulsecheck record.  Nursing notes have been reviewed by the physician/mid-level provider.    If you have any questions please contact [phone].    CH  D:12/16/2012 14:11:41  T: 12/16/2012 20:44:45  [id number]   Authenticated by Carolin Guernsey, DO On 01/01/2013 11:35:31 PM

## 2013-02-28 NOTE — ED Provider Notes (Signed)
[facility]  EMERGENCY DEPARTMENT TREATMENT REPORT  NAME:  Ashley [name]  SEX:   F  ADMIT: 02/28/2013  DOB:   [birth year]  MR#    [id number]  ROOM:    TIME DICTATED: 09 54 AM  ACCT#  [id number]    cc: Charlyn Minerva MD    PRIMARY CARE PHYSICIAN:  Dr. Chipper Herb.    DATE AND TIME OF EVALUATION:  Thursday 02/28/2013.    CHIEF COMPLAINT:  Coughing.    HISTORY OF PRESENT ILLNESS:  This 27 year old female presents with cough and chest pain with coughing for   the last week or so.  She says she had gone to [place] and had a cough with   upper respiratory symptoms, sinus congestion and a fever of 103. Most of that   has resolved but she says that she is having this coughing that is very   painful and she is not coughing up any sputum.  She feels like she is very   congested in the chest.  At this point, she says she has been trying Thera-Flu   and Mucinex without any relief of symptoms.  She has no past medical problems   and she has taken no medications for her symptoms today.    REVIEW OF SYSTEMS:  CONSTITUTIONAL:  Fever, now resolved. Positive for night sweats.   EYES:  No visual symptoms.  ENT:  Sinus congestion, upper respiratory symptoms improving.    HEMATOLOGIC/LYMPHATICS:  No excessive bruising or lymph node swelling.  RESPIRATORY:  Cough, shortness of breath, wheezing.  CARDIOVASCULAR:  Chest pain but no palpitations.  GASTROINTESTINAL:  Nausea but no vomiting.  MUSCULOSKELETAL:  No joint pain or swelling.   INTEGUMENTARY:  No rashes.   NEUROLOGICAL:  No headaches, sensory or motor symptoms.    PAST MEDICAL HISTORY:   None.     PAST SURGICAL HISTORY:  Gastric bypass.    SOCIAL HISTORY:  The patient smokes cigarettes occasionally.  Denies alcohol or drug abuse.    FAMILY HISTORY:  Negative for coronary artery disease.  Positive for diabetes.    MEDICATIONS:  Reviewed in Ibex.    ALLERGIES:   REVIEWED IN IBEX.     PHYSICAL EXAMINATION:  GENERAL APPEARANCE:  Well-developed, well-nourished female lying on exam table    in no acute distress.  VITAL SIGNS:  Blood pressure is 132/71, pulse 66, respirations 20, temperature   97.9, O2 saturation 96% on room air.  HEENT:  Eyes:  Conjunctivae clear, lids normal. Pupils equal, symmetrical and   normally reactive.   ENT:  Shows buccal mucosa is somewhat dry without lesion.  NECK:  Supple, nontender to palpation. No cervical or submandibular   lymphadenopathy palpated.  RESPIRATORY:  Lungs with scattered wheezes throughout.  No rhonchi or rales.  CARDIOVASCULAR:  Heart regular without murmurs, gallops, rubs or thrills.  GASTROINTESTINAL:  Abdomen soft, nontender, without complaint of pain to   palpation. No hepatomegaly or splenomegaly.  MUSCULOSKELETAL:  Nails:  No clubbing or deformities.  Nailbeds pink  with   prompt capillary refill.  SKIN: Warm and dry without lesions.  NEUROLOGIC: Alert, oriented. Sensation intact, motor strength equal and   symmetric.     CONTINUED BY Ashley GREGORY, PA-C:     ASSESSMENT AND MANAGEMENT PLAN:     This 27 year old female presents with cough for a week with a fever earlier   approximately a week ago of 103.  She does have a history this past fall of   pneumonia.  We will go ahead and do a chest x-ray.  She is wheezing on   examination so will give her a nebulizer treatment.  She does not have a   history of asthma.  She was given Norco for pain as she has an ALLERGY TO   NSAIDS.       DIAGNOSTIC TEST RESULTS:    Chest x-ray was read as normal.      COURSE IN THE EMERGENCY DEPARTMENT:  The patient remained well appearing throughout her stay in the Emergency   Department.  She continued to have wheezing.  She was also given 4 mg p.o.   Zofran for nausea.   We have informed her of all diagnostic test results.    Will put her on Flonase for nasal congestion and will give her an inhaler,   steroids for the continued wheezing and Tussionex for cough.  She needs to   follow up with her primary and return to the ED for any new or worsening   symptom.        DIAGNOSES:  1. Cough.  2. Acute rhinitis.      DISPOSITION:  The patient dispositioned home in stable condition with Flonase, ProAir   inhaler, prednisone and Tussionex.  Follow up with primary and return to the   ED for any new or worsening symptoms.  The patient was personally evaluated by   myself and Dr. Hervey Ard who agrees with the above assessment and plan.      ___________________  Tana Conch MD  Dictated By: Thea Silversmith, PA-C    My signature above authenticates this document and my orders, the final  diagnosis (es), discharge prescription (s), and instructions in the [facility]   Pulsecheck record.  Nursing notes have been reviewed by the physician/mid-level provider.    If you have any questions please contact [phone].    [facility]  D:02/28/2013 09:54:43  T: 02/28/2013 10:57:41  [id number]  Authenticated by Tana Conch, MD On 03/21/2013 05:01:46 PM

## 2013-09-20 NOTE — ED Provider Notes (Signed)
[facility]  EMERGENCY DEPARTMENT TREATMENT REPORT  NAME:  Ashley [name]  SEX:   F  ADMIT: 09/20/2013  DOB:   12/11/1985  MR#    [id number]  ROOM:    TIME DICTATED: 10 27 AM  ACCT#  [id number]        DATE OF SERVICE:  09/20/2013     PRIMARY CARE PHYSICIAN:  None.    CHIEF COMPLAINT:  Left  foot pain and swelling.    HISTORY OF PRESENT ILLNESS:  This is a 28 year old female presenting with a complaint of swelling and pain  to the left foot.  She noticed development approximately 2 days ago.  Denies  any history of injury, trauma or fall.  She states it began as a small area of  redness over the left foot dorsum.  Since then it is progressively worsened  and now the area on area of redness is increasing as well as his pain and  swelling.  She has been taking over-the-counter NSAIDS without significant  relief.  She states it hurts more with contact and with weightbearing.  Denies  any fevers, chills, is here at this time for further evaluation.    REVIEW OF SYSTEMS:  CONSTITUTIONAL:  No fevers or chills.  RESPIRATORY:  No cough.  GASTROINTESTINAL:  No vomiting.  MUSCULOSKELETAL:  As above.  INTEGUMENTARY:  As above.  NEUROLOGICAL:  No paresthesias.    PAST MEDICAL HISTORY:  Gastric bypass.    SOCIAL HISTORY:  Smoker, occasional alcohol consumption.    MEDICATIONS:  None.    ALLERGIES:  REVIEWED IN IBEX.    PHYSICAL EXAMINATION:  VITAL SIGNS:  Blood pressure 108/68, pulse 85, respirations 16, temperature  98.6, O2 saturation 99% on room air, pain rated 10 out of 10.  GENERAL APPEARANCE:  Patient appears well developed and well nourished.  Appearance and behavior are age and situation appropriate.  She is lying  comfortably on a stretcher, nontoxic appearing.  RESPIRATORY:  Lungs are clear to auscultation bilaterally, no wheezing.  CARDIOVASCULAR:  Heart regular rate and rhythm, no murmurs.  MUSCULOSKELETAL:  There is asymmetric swelling noted over the left foot dorsum   as well as some erythema.  The entire dorsum is tender to palpation.  She is  able to move all toes appropriately.  Pedal pulses are intact and equal.  Capillary refill is brisk and intact to all left toes.  SKIN:  There is some mild erythema noted over the left foot dorsum.  There is  no palpable fluctuance or induration.  NEUROLOGIC:  Sensation is intact to light touch to the left foot and all of  toes.    INITIAL ASSESSMENT AND MANAGEMENT PLAN:  A patient presenting with complaints of swelling and redness to the left foot  dorsum nontraumatic in nature.  The patient also had expressed some concern  about some abnormal bruising to the left upper thigh as well.  Today, we will  rule out an occlusive process such a deep venous thrombosis by obtaining a  PVL.  If this is negative, I suspect this is likely some cellulitis.    DIAGNOSTIC STUDIES:  PVL of the left leg is negative for acute DVT.    COURSE IN THE EMERGENCY DEPARTMENT:  The patient remained stable throughout her stay.  Results of diagnostics were  reviewed with the patient.  She had a negative PVL.  Therefore, I suspect this  is likely related to cellulitis for which we will get her started on some  antibiotics and pain medication.  There is no evidence of neurovascular  compromise.  She denies any history of trauma.  I do not believe for any  further imaging is warranted.    CLINICAL IMPRESSION AND DIAGNOSIS:  Cellulitis left foot.    DISPOSITION AND PLAN:  The patient discharged home in stable condition.  Instructed to follow up with  primary care physician and given contact information for Dr. Miles Costain.  Given  prescriptions for Norco and Keflex.  Advised to return at any time for any  worsening or symptoms of concern.      The patient was personally evaluated by myself and Dr. Janett Billow. Henrene Hawking, M.D.  who agrees with the above assessment and plan.      ___________________  Konrad Felix MD  Dictated By: Zachary George. Pernell Dupre, PA-C     My signature above authenticates this document and my orders, the final  diagnosis (es), discharge prescription (s), and instructions in the [facility]  Pulsecheck record.  Nursing notes have been reviewed by the physician/mid-level provider.    If you have any questions please contact [phone].    LB  D:09/20/2013 10:27:39  T: 09/20/2013 22:21:50  [id number]  Electronically Authenticated by:  Janett Billow. Henrene Hawking, M.D. On 09/30/2013 11:55 PM EDT

## 2013-11-11 NOTE — ED Provider Notes (Signed)
[facility]  EMERGENCY DEPARTMENT TREATMENT REPORT  NAME:  Ashley [name]  SEX:   F  ADMIT: 11/10/2013  DOB:   08-02-1985  MR#    [id number]  ROOM:    TIME DICTATED: 02 38 AM  ACCT#  [id number]    cc: Charlyn Minerva MD    CHIEF COMPLAINT:   Bilateral leg pain.      HISTORY OF PRESENT ILLNESS:   This is a 28 year old female who presented to emergency room tonight  complaining of bilateral leg pain extending from both feet up to her hips.  The patient states that the pain was sudden and she described the pain as  aching and constant in both legs, 10 out of 10.  The patient denies any injury  or any trauma to the legs.      REVIEW OF SYSTEMS:     CONSTITUTIONAL: No fever or chills.   ENT:  No sore throat, runny nose or upper respiratory infection symptoms.   RESPIRATORY:  No cough, shortness of breath, or wheezing.   CARDIOVASCULAR:  No chest pain, chest pressure, or palpitations.   GASTROINTESTINAL:  No vomiting, diarrhea, or abdominal pain.   MUSCULOSKELETAL:  Positive for bilateral leg pain.   NEUROLOGIC: No headache. Positive for tingling in the legs but no motor  symptoms.   PSYCHIATRIC:  The patient denies any suicidal or homicidal ideation.     PAST MEDICAL HISTORY:   The patient has history of sickle cell trait.       PAST SURGICAL HISTORY:     Gastric bypass.      SOCIAL HISTORY:   The patient used tobacco and alcohol occasionally.     FAMILY HISTORY:   Positive for sickle cell.      MEDICATIONS:  Current medications Zoloft.        ALLERGIES:   ASPIRIN, MOTRIN.      PHYSICAL EXAMINATION:   VITAL SIGNS:  Blood pressure 112/86, pulse 97, respirations 18, temperature  98.1, pain 10 out of 10.   O2 saturation 100% on room air.   GENERAL APPEARANCE:  This is a 28 year old female  well-nourished,  well-developed in  distress. She is crying, lying on stretcher.    ENT: Conjunctivae are clear.    NECK:  Supple and nontender.      RESPIRATORY:   Clear to auscultation bilaterally, no wheezes or rhonchi.     CARDIOVASCULAR: Regular rate and rhythm, no murmur or gallop.  MUSCULOSKELETAL:  Tender to palpation bilaterally on both lower extremities  from feet to hips.  Full range of motion.   NEUROLOGIC: The patient shows no focal neurologic deficit as she has sensation  in both lower extremities bilaterally.  Posterior tibial pulses are +4 and  also dorsal pedal pulses +4 bilaterally.   The patient is alert and oriented and answers questions appropriately.      INITIAL ASSESSMENT AND MANAGEMENT PLAN:   This is a 28 year old female with a history of sickle cell trait and who has  not had a flare-up since her teen age years.  Presented today with sudden  onset of bilateral leg pain and tingling.  Based on presentation we obtained  some basic labs such as CBC, CMP to rule out acute infection or electrolyte  abnormality.  We also obtained reticulocyte count to rule out any acute anemia  or sickle cell flare-up.  We will medicate the patient for pain.      DIAGNOSTIC INTERPRETATIONS:   A UA was unremarkable.  Urine pregnancy was negative.  CBC and BMP was within  normal limits.  Reticulocyte count was within normal limits.     EMERGENCY DEPARTMENT COURSE:   The patient received 2 vials of 4 mg IV push morphine and some Zofran.  The  patient's pain went down to about a 6 out of 10 and she felt comfortable and  the patient was able to ambulate in the ED with no assist.  At this point we  felt comfortable discharging the patient home.     FINAL DIAGNOSES:   1.  Leg pain.   2.  History of sickle cell trait.      DISPOSITION:   The patient is discharged home in stable condition.  Given a prescription for  Norco for pain.  The patient to follow up with primary care physician for  further evaluation. The patient to return to ER if condition worsens or if new  symptoms develop.  The patient agrees with above plan.  The patient was  personally evaluated by myself and Dr. Wenda Overland who agrees with the above  assessment and plan.       ___________________  Liberty Handy Himmel-Ansar Skoda DO  Dictated By: Marland Kitchen     My signature above authenticates this document and my orders, the final  diagnosis (es), discharge prescription (s), and instructions in the [facility]  Pulsecheck record.  Nursing notes have been reviewed by the physician/mid-level provider.    If you have any questions please contact [phone].    FS  D:11/11/2013 02:38:24  T: 11/11/2013 11:09:52  [id number]  Electronically Authenticated by:  Carolin Guernsey, DO On 11/14/2013 03:12 PM EDT

## 2014-07-07 ENCOUNTER — Inpatient Hospital Stay
Admit: 2014-07-07 | Discharge: 2014-07-08 | Disposition: A | Discharge status: Home / Self Care | Payer: MEDICAID | Attending: Emergency Medicine

## 2014-07-07 ENCOUNTER — Emergency Department: Admit: 2014-07-08 | Payer: MEDICAID | Primary: Family Medicine

## 2014-07-07 DIAGNOSIS — M436 Torticollis: Secondary | ICD-10-CM

## 2014-07-07 NOTE — ED Notes (Signed)
I have reviewed discharge instructions with the patient.  The patient verbalized understanding.Patient armband removed and given to patient to take home.  Patient was informed of the privacy risks if armband lost or stolen

## 2014-07-07 NOTE — ED Notes (Signed)
Pt c/o HA, SOB, right sided CP & nausea x2-3 days.  CP worse with inspiration.  Pt states pain also increases when she turns to the right

## 2014-07-08 LAB — POC URINE MACROSCOPIC
Bilirubin: NEGATIVE
Blood: NEGATIVE
Glucose: NEGATIVE mg/dl
Ketone: NEGATIVE mg/dl
Leukocyte Esterase: NEGATIVE
Nitrites: NEGATIVE
Protein: NEGATIVE mg/dl
Specific gravity: 1.025 (ref 1.005–1.030)
Urobilinogen: 0.2 EU/dl (ref 0.0–1.0)
pH (UA): 6 (ref 5–9)

## 2014-07-08 MED ORDER — DIAZEPAM 5 MG TAB
5 mg | ORAL_TABLET | Freq: Three times a day (TID) | ORAL | Status: DC | PRN
Start: 2014-07-08 — End: 2015-01-07

## 2014-07-08 MED ORDER — DIAZEPAM 5 MG TAB
5 mg | ORAL | Status: AC
Start: 2014-07-08 — End: 2014-07-07
  Administered 2014-07-08: 02:00:00 via ORAL

## 2014-07-08 MED FILL — DIAZEPAM 5 MG TAB: 5 mg | ORAL | Qty: 1

## 2014-07-08 NOTE — ED Provider Notes (Signed)
[facility]  EMERGENCY DEPARTMENT TREATMENT REPORT  NAME:  Ashley [name]  SEX:   F  ADMIT: 07/07/2014  DOB:   1985/07/01  MR#    [id number]  ROOM:  EA54  TIME DICTATED: 09 20 PM  ACCT#  [id number]    cc: Charlyn Minerva MD    TIME OF EVALUATION:  2100    PRIMARY CARE PHYSICIAN:  Dr. Chipper Herb.     CHIEF COMPLAINT:  Headache, neck pain, right sided back pain and chest pain.    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  It has been rather consistent and   got worse this morning.  She has taken some BC Powders for it, but not helping   at this point.  It is an 8 out of 10 in all the areas described.  It hurts   more when she is moving her neck to the right than it is to the left side, and   also when she is twisting her torso to the right side versus the left side.    She tells me she has had similar pain previously on the left side, a number of   years ago, she does not remember how she was treated.  She was seen in the   Emergency Department.  She denies any fever or chills, cough.  No abdominal   pain.  No nausea, vomiting or diarrhea.  No alleviating or exacerbating   factors.  She has not had any trauma.    REVIEW OF SYSTEMS:   CONSTITUTIONAL:  No fever, chills, or weight loss.   CARDIOVASCULAR:  Positive for musculoskeletal pain on the right chest wall.    MUSCULOSKELETAL:  Positive for pain in paracervical, right side trapezius,   right side of her back.  NEUROLOGIC:  No headache.    PAST MEDICAL HISTORY:   The patient has not pertinent past medical history.      SURGICAL HISTORY:  Gastric bypass.    PSYCHIATRIC HISTORY:   No psychiatric history.    FAMILY HISTORY:    No heart disease.    SOCIAL HISTORY:   The patient does not smoke.  Drinks socially.  No illicit drug use.      ALLERGIES:  NSAIDS, due to the gastric bypass.    MEDICATIONS:  No medications.    PHYSICAL EXAMINATION:    VITAL SIGNS:  Her blood pressure 115/64, pulse 57, respirations 16,   temperature 98.5, satting at 100% on room air.  CONSTITUTIONAL:  This is a slightly obese 29 year old female, lying on her   left side.  She does have some difficulty moving up onto her right side due to   the pain that she is having in the above mentioned areas.  She is otherwise   cooperative and pleasant.  HEENT:  Eyes:  Conjunctivae clear, lids normal.  Pupils equal, symmetrical,   and normally reactive.  Mouth/Throat:  Surfaces of the pharynx, palate, and   tongue are pink, moist, and without lesions.   RESPIRATORY:  Clear and equal breath sounds.  No respiratory distress,   tachypnea, or accessory muscle use.   CARDIOVASCULAR:  Heart regular, without murmurs, gallops, rubs, or thrills.    DP pulses 2+ and equal bilaterally.  No peripheral edema or significant   varicosities.   CHEST:  Chest symmetrical without masses or tenderness.   GASTROINTESTINAL:  Abdomen soft, nontender, without complaint of pain to   palpation.  No hepatomegaly or splenomegaly.   MUSCULOSKELETAL:  The patient has some tenderness to palpation in the right   paracervical musculature, as well as the right upper thoracic back.  There is   no organomegaly, mass, ecchymosis, or hematoma.  She has no pain along the   midline of either of those areas.  No crepitus, ecchymosis or any other signs   of trauma.  NEUROLOGIC:  The patient is alert and oriented x3.     CONTINUATION BY JEFFREY PADGETT, PA-C:    IMPRESSION AND PLAN:  This is a 29 year old female who presents for evaluation of paracervical   midthoracic upper back pain radiating around to the chest.  She works as a   Leisure centre managerhome healthcare aide.  I suspect that she overexerted herself.  Pain is   reproducible on palpation and with movement.  We will get an EKG and a chest   x-ray because of her description of chest pain, medicate her with Valium and   reevaluate.    DIAGNOSTIC INTERPRETATIONS:   Dr. Jama FlavorsManolio did not see any acute S-T segment or T-wave abnormalities that are   consistent with acute ischemia or infarction.  Dr. Jama FlavorsManolio also did not see   anything abnormal on her chest x-ray.     EMERGENCY DEPARTMENT COURSE:   The patient was given 5 mg of oral Valium.  Subsequent to that ready for   discharge.    DIAGNOSES:  1. Torticollis.  2. Acute right-sided thoracic back pain.     DISPOSITION:  The patient remained completely stable during her course in the Emergency   Department.  She was discharged home in stable condition.  I wrote her a   prescription for Valium, work note and advised her to follow up with her PCP,   return to the Emergency Department if condition worsens or if any new symptoms   develop.  The patient was agreeable to that plan.  The patient was seen and   evaluated by myself and Dr. Jama FlavorsManolio who agrees with the above assessment and   plan.      ___________________  Imogene Burnichard Tajae Maiolo M.D.  Dictated By: Fransisca KaufmannJeffrey Padgett, PA-C    My signature above authenticates this document and my orders, the final   diagnosis (es), discharge prescription (s), and instructions in the Epic   record.  If you have any questions please contact [phone].    Nursing notes have been reviewed by the physician/ advanced practice   clinician.    ST  D:07/07/2014 21:20:30  T: 07/08/2014 10:55:02  [id number]

## 2014-07-09 LAB — EKG, 12 LEAD, INITIAL
Atrial Rate: 54 {beats}/min
Calculated P Axis: 47 degrees
Calculated R Axis: 58 degrees
Calculated T Axis: 55 degrees
P-R Interval: 156 ms
Q-T Interval: 416 ms
QRS Duration: 88 ms
QTC Calculation (Bezet): 394 ms
Ventricular Rate: 54 {beats}/min

## 2015-01-07 ENCOUNTER — Emergency Department: Admit: 2015-01-07 | Payer: Self-pay | Primary: Family Medicine

## 2015-01-07 ENCOUNTER — Inpatient Hospital Stay
Admit: 2015-01-07 | Discharge: 2015-01-07 | Disposition: A | Discharge status: Home / Self Care | Payer: Self-pay | Attending: Emergency Medicine

## 2015-01-07 DIAGNOSIS — J209 Acute bronchitis, unspecified: Secondary | ICD-10-CM

## 2015-01-07 MED ORDER — ALBUTEROL SULFATE HFA 90 MCG/ACTUATION AEROSOL INHALER
90 mcg/actuation | RESPIRATORY_TRACT | 0 refills | Status: DC | PRN
Start: 2015-01-07 — End: 2016-07-26

## 2015-01-07 MED ORDER — ACETAMINOPHEN 325 MG TABLET
325 mg | ORAL | Status: AC
Start: 2015-01-07 — End: 2015-01-07
  Administered 2015-01-07: 14:00:00 via ORAL

## 2015-01-07 MED ORDER — PREDNISONE 20 MG TAB
20 mg | ORAL_TABLET | Freq: Every day | ORAL | 0 refills | Status: DC
Start: 2015-01-07 — End: 2016-07-26

## 2015-01-07 MED ORDER — PREDNISONE 20 MG TAB
20 mg | ORAL | Status: AC
Start: 2015-01-07 — End: 2015-01-07
  Administered 2015-01-07: 14:00:00 via ORAL

## 2015-01-07 MED ORDER — IPRATROPIUM-ALBUTEROL 2.5 MG-0.5 MG/3 ML NEB SOLUTION
2.5 mg-0.5 mg/3 ml | RESPIRATORY_TRACT | Status: AC
Start: 2015-01-07 — End: 2015-01-07
  Administered 2015-01-07: 14:00:00 via RESPIRATORY_TRACT

## 2015-01-07 MED FILL — PREDNISONE 20 MG TAB: 20 mg | ORAL | Qty: 3

## 2015-01-07 MED FILL — ACETAMINOPHEN 325 MG TABLET: 325 mg | ORAL | Qty: 3

## 2015-01-07 NOTE — ED Provider Notes (Signed)
[facility]  Emergency Department Treatment Report    Patient: Ashley [name] Age: 29 y.o. Sex: female    Date of Birth: 11-Feb-1986 Admit Date: 01/07/2015 PCP: Charlyn Minerva, MD   MRN: [id number]  CSN: [id number]     Room: ER41/ER41 Time Dictated: 11:44 AM      Chief Complaint   Cough  History of Present Illness   29 y.o. female presents complaining of cough for 1 week, initially dry, and now productive in nature with difficulty sleeping due to the cough. Patient states improved 100 she used her boyfriend's nebulizer treatment at home. She is complaining of chest burning bilateral with cough, nonradiating, rates pain as an 8 on a scale of 1-10. She complains of associated body aches, fever reaching 102.4 headache. Patient states some relief with over-the-counter Motrin. Yesterday states she vomited 3 times after coughing,,    Review of Systems   Constitutional:  Fever  ENT: Congestion, sore throat  Respiratory: Cough, wheezing  Cardiovascular: Chest pain  Gastrointestinal: No abdominal pain, vomiting  Genitourinary: No GU symptoms  Musculoskeletal: Positive body aches  Integumentary: No rash  Hematologic: No hemoptysis  Neurological: Positive headache  Denies complaints in all other systems  Past Medical/Surgical History     Past Medical History   Diagnosis Date   ??? Pneumonia      Past Surgical History   Procedure Laterality Date   ??? Hx cesarean section  2002, 2004, 2008     x 3   ??? 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 Hx gastric bypass     ??? Hx gastric bypass     ??? Hx cesarean section       Social History     Social History     Social History   ??? Marital status: LEGALLY SEPARATED     Spouse name: N/A   ??? Number of children: N/A   ??? Years of education: N/A     Social History Main Topics   ??? Smoking status: Former Smoker     Types: Cigarettes     Quit date: 11/12/2009   ??? Smokeless tobacco: None   ??? Alcohol use No   ??? Drug use: No   ??? Sexual activity: Yes     Partners: Male      Birth control/ protection: None     Other Topics Concern   ??? None     Social History Narrative    ** Merged History Encounter **         smokes cigars , a few times a month   Family History     Family History   Problem Relation Age of Onset   ??? Diabetes Mother    ??? Hypertension Father    ??? Heart Disease Maternal Grandmother    ??? Heart Attack Maternal Grandmother    ??? High Cholesterol Maternal Grandmother    ??? Arthritis-osteo Maternal Grandmother    ??? Stroke Paternal Aunt    ??? Kidney Disease Maternal Aunt      Home Medications     Prior to Admission medications    Medication Sig Start Date End Date Taking? Authorizing Provider   predniSONE (DELTASONE) 20 mg tablet Take 2 Tabs by mouth daily (with breakfast). 01/07/15  Yes Dorise Hiss, PA   albuterol (PROVENTIL HFA, VENTOLIN HFA, PROAIR HFA) 90 mcg/actuation inhaler Take 2 Puffs by inhalation every four (4) hours as needed for Wheezing. 01/07/15  Yes Dorise Hiss, PA   prenatal vit-iron fumarate-fa 28-0.8 mg Tab Take  by mouth.      Historical Provider   folic acid (FOLVITE) 1 mg tablet Take 1 Tab by mouth daily. 12/20/10   Adelfa Koh, MD   ondansetron (ZOFRAN ODT) 4 mg disintegrating tablet Take 1 Tab by mouth every eight (8) hours as needed for Nausea. 12/20/10   Adelfa Koh, MD   multivitamin (ONE A DAY) tablet Take 1 Tab by mouth daily.      Historical Provider   ERGOCALCIFEROL, VITAMIN D2, (VITAMIN D2 PO) Take  by mouth.      Historical Provider   cyanocobalamin (B-12 DOTS) 500 mcg tablet Take 500 mcg by mouth daily.      Historical Provider     Allergies     Allergies   Allergen Reactions   ??? Aspirin Other (comments)     Stomach ulcer   ??? Ibuprofen Unknown (comments)     Gastric Bypass--advised to avoid   ??? Nsaids (Non-Steroidal Anti-Inflammatory Drug) Other (comments)     Hx gastric bypass     Physical Exam   ED Triage Vitals   Enc Vitals Group      BP 01/07/15 0924 124/81      Pulse --       Resp Rate 01/07/15 0924 22       Temp 01/07/15 0924 99.2 ??F (37.3 ??C)      Temp src --       O2 Sat (%) 01/07/15 0924 99 %      Weight 01/07/15 0924 174 lb      Height 01/07/15 0924 5'     Constitutional: Patient appears well developed and well nourished. Marland Kitchen Appearance and behavior are age and situation appropriate.  HEENT: Conjunctiva clear.  PERRLA. Mucous membranes moist, non-erythematous. Surface of the pharynx, palate, and tongue are pink, moist and without lesions.  Neck: supple, non tender, symmetrical, no masses or JVD.   Respiratory: Wheezing bilateral, heard greatest anteriorly, no retractions, no tachypnea  Cardiovascular: heart regular rate and rhythm without murmur rubs or gallops.   Calves soft and non-tender. Distal pulses 2+ and equal bilaterally.  No peripheral edema or significant variscosities.    Gastrointestinal:  Abdomen soft, nontender without complaint of pain to palpation  Musculoskeletal: Nail beds pink with prompt capillary refill  Integumentary: warm and dry without rashes or lesions  Neurologic: alert and oriented.  No facial asymmetry or dysarthria. Moving extremities well. No nuchal rigidity        Impression and Management Plan   Patient presents with chest pain history of fever and cough we'll evaluate for pneumonia   Diagnostic Studies   Lab:   No results found for this or any previous visit (from the past 12 hour(s)).    Imaging:    Xr Chest Pa Lat    Result Date: 01/07/2015  History: Cough and fever with shortness of breath PA lateral chest: Compared to 07/07/2014 the heart is normal in size. No airspace disease or effusion. Staples are present left upper quadrant.     IMPRESSION: No acute abnormality.     ED Course   Albuterol Atrovent nebulizer treatment given with improvement of bronchospasm , by mouth Tylenol given   Medical Decision Making   No pneumonia on chest x-ray , do not see any need for any antibody aches   Final Diagnosis       ICD-10-CM ICD-9-CM    1. Acute  bronchitis, unspecified organism J20.9 466.0       Disposition   Discharge with prescription for albuterol inhaler and prednisone , may take Delsym for cough , Motrin for body aches. work note given .    The patient was personally evaluated by myself and Dr. Sherlon Handingomash who agrees with the above assessment and plan      Dorise Hissiana A Louana Fontenot, PA  January 07, 2015    My signature above authenticates this document and my orders, the final ??  diagnosis (es), discharge prescription (s), and instructions in the Epic ??  record.  If you have any questions please contact [phone].  ??  Nursing notes have been reviewed by the physician/ advanced practice ??  Clinician.

## 2015-01-07 NOTE — ED Notes (Signed)
I have reviewed discharge instructions with the patient.  The patient verbalized understanding.

## 2015-01-07 NOTE — ED Notes (Signed)
Jerolyn ShinLeroy, RT notified of this pt

## 2015-01-07 NOTE — ED Notes (Signed)
Very minor ins wheeze after br tx

## 2016-07-26 ENCOUNTER — Inpatient Hospital Stay
Admit: 2016-07-26 | Discharge: 2016-07-26 | Disposition: A | Discharge status: Home / Self Care | Payer: PRIVATE HEALTH INSURANCE | Attending: Emergency Medicine

## 2016-07-26 DIAGNOSIS — S91342A Puncture wound with foreign body, left foot, initial encounter: Secondary | ICD-10-CM

## 2016-07-26 MED ORDER — DIPHTH,PERTUS(ACEL)TETANUS VAC(PF) 2 LF-(5-3-5MCG)-5 LF/0.5 ML IM SUSP
2 Lf-(.5-5-3-5 mcg)-5Lf/0.5 mL | Freq: Once | INTRAMUSCULAR | Status: AC
Start: 2016-07-26 — End: 2016-07-26
  Administered 2016-07-26: 15:00:00 via INTRAMUSCULAR

## 2016-07-26 MED ORDER — CIPROFLOXACIN 500 MG TAB
500 mg | ORAL_TABLET | Freq: Two times a day (BID) | ORAL | 0 refills | Status: AC
Start: 2016-07-26 — End: 2016-07-29

## 2016-07-26 MED ORDER — CIPROFLOXACIN 500 MG TAB
500 mg | ORAL | Status: AC
Start: 2016-07-26 — End: 2016-07-26
  Administered 2016-07-26: 16:00:00 via ORAL

## 2016-07-26 MED ORDER — HYDROCODONE-ACETAMINOPHEN 5 MG-325 MG TAB
5-325 mg | ORAL | Status: AC
Start: 2016-07-26 — End: 2016-07-26
  Administered 2016-07-26: 15:00:00 via ORAL

## 2016-07-26 MED FILL — ADACEL (TDAP ADOLESN/ADULT)(PF)2LF-(2.5-5-3-5MCG)-5 LF/0.5 ML IM SUSP: 2 Lf-(.5-5-3-5 mcg)-5Lf/0.5 mL | INTRAMUSCULAR | Qty: 0.5

## 2016-07-26 MED FILL — HYDROCODONE-ACETAMINOPHEN 5 MG-325 MG TAB: 5-325 mg | ORAL | Qty: 1

## 2016-07-26 MED FILL — CIPROFLOXACIN 500 MG TAB: 500 mg | ORAL | Qty: 1

## 2016-07-26 NOTE — ED Triage Notes (Signed)
Stepped on toothpick

## 2016-07-26 NOTE — ED Provider Notes (Signed)
[facility]  Emergency Department Treatment Report    Patient: Ashley [name] Age: 31 y.o. Sex: female    Date of Birth: [birth year] Admit Date: 07/26/2016 PCP: Da Chipper Herb, MD   MRN: [id number]  CSN: [id number]  Attending: Smitty Cords, MD   Room: 104/EO04 Time Dictated: 10:21 AM APP:  Terressa Koyanagi       Chief Complaint    Foreign body in left foot  History of Present Illness   31 y.o. female was stepping over a toy and her daughter's bedroom this morning and accidentally stepped on a toothpick which punctured her left foot and remained inside the soft tissue.  She tried to pull it out but it was too painful.  She was wearing socks but no shoes.  She has sharp constant pain with any movement of the toes or foot.  Her tetanus is not up-to-date.  Denies any radiating pain.  She has not taken any medications to treat her pain    Review of Systems   Review of Systems   Constitutional: Negative for fever.   Skin: Negative for rash.   Neurological: Positive for tingling. Negative for sensory change and focal weakness.       Past Medical/Surgical History     Past Medical History:   Diagnosis Date   ??? Pneumonia      Past Surgical History:   Procedure Laterality Date   ??? HX CESAREAN SECTION  2002, 2004, 2008    x 3   ??? HX CESAREAN SECTION     ??? HX GASTRIC BYPASS  10-2009    [facility]   ??? HX GASTRIC BYPASS     ??? HX GASTRIC BYPASS         Social History     Social History     Social History   ??? Marital status: SINGLE     Spouse name: N/A   ??? Number of children: N/A   ??? Years of education: N/A     Social History Main Topics   ??? Smoking status: Former Smoker     Types: Cigarettes     Quit date: 11/12/2009   ??? Smokeless tobacco: Never Used   ??? Alcohol use 0.0 - 3.6 oz/week     0 - 3 Glasses of wine, 0 - 3 Shots of liquor per week   ??? Drug use: No   ??? Sexual activity: Yes     Partners: Male     Birth control/ protection: None     Other Topics Concern   ??? None     Social History Narrative     ** Merged History Encounter **            Family History     Family History   Problem Relation Age of Onset   ??? Diabetes Mother    ??? Hypertension Father    ??? Heart Disease Maternal Grandmother    ??? Heart Attack Maternal Grandmother    ??? High Cholesterol Maternal Grandmother    ??? Arthritis-osteo Maternal Grandmother    ??? Stroke Paternal Aunt    ??? Kidney Disease Maternal Aunt        Home Medications     No current facility-administered medications for this encounter.      Current Outpatient Prescriptions   Medication Sig   ??? clonazePAM (KLONOPIN) 1 mg tablet Take 1 mg by mouth daily.   ??? ciprofloxacin HCl (CIPRO) 500 mg tablet Take 1 Tab by mouth  two (2) times a day for 3 days.   ??? folic acid (FOLVITE) 1 mg tablet Take 1 Tab by mouth daily.   ??? multivitamin (ONE A DAY) tablet Take 1 Tab by mouth daily.     ??? ERGOCALCIFEROL, VITAMIN D2, (VITAMIN D2 PO) Take  by mouth.     ??? cyanocobalamin (B-12 DOTS) 500 mcg tablet Take 500 mcg by mouth daily.         Allergies     Allergies   Allergen Reactions   ??? Aspirin Other (comments)     Stomach ulcer   ??? Ibuprofen Unknown (comments)     Gastric Bypass--advised to avoid   ??? Nsaids (Non-Steroidal Anti-Inflammatory Drug) Other (comments)     Hx gastric bypass       Physical Exam   ED Triage Vitals   ED Encounter Vitals Group      BP 07/26/16 0920 115/64      Pulse (Heart Rate) 07/26/16 0920 73      Resp Rate 07/26/16 0920 20      Temp 07/26/16 0920 99 ??F (37.2 ??C)      Temp src --       O2 Sat (%) 07/26/16 0920 100 %      Weight 07/26/16 0918 196 lb      Height 07/26/16 0918 5'     Physical Exam   Constitutional:   Well-developed, well-nourished African-American female who is distressed due to her foot pain   Cardiovascular: Normal rate, regular rhythm and normal heart sounds.    Pulmonary/Chest: Effort normal and breath sounds normal. No respiratory distress.   Skin:   Left foot: Toothpick paled in patient's sole of foot over the medial  portion along the third metatarsal.  It is easily moves but doesn't come out with pressure.  It is quite painful to the patient.  No surrounding erythema, edema or bleeding.  Sensations intact over the toes which patient can wiggle.       Impression and Management Plan   31 year old female presenting with a toothpick in her foot.  It went through her sock and not her shoe.    Foreign Body Removal  Date/Time: 07/26/2016 11:53 AM  Performed by: Adela Lank  Authorized by: Terressa Koyanagi F     Consent:     Consent obtained:  Verbal    Consent given by:  Patient    Risks discussed:  Bleeding, infection, pain and incomplete removal  Location:     Location:  Foot    Foot location:  L sole    Depth:  Subcutaneous  Pre-procedure details:     Imaging:  None    Neurovascular status: intact    Anesthesia (see MAR for exact dosages):     Anesthesia method:  Local infiltration    Local anesthetic:  Lidocaine 1% w/o epi  Procedure type:     Procedure complexity:  Simple  Procedure details:     Dissection of underlying tissues: no      Bloodless field: yes      Removal mechanism:  Hemostat    Foreign bodies recovered:  1    Intact foreign body removal: yes    Post-procedure details:     Neurovascular status: intact      Confirmation:  No additional foreign bodies on visualization    Skin closure:  None    Patient tolerance of procedure:  Tolerated well, no immediate complications        Diagnostic Studies  Lab:   No results found for this or any previous visit (from the past 12 hour(s)).    Imaging:    No results found.        ED Course     ED Course     Medications   diph,Pertuss(Acell),Tet Vac-PF (ADACEL) susp 0.5 mL (0.5 mL IntraMUSCular Given 07/26/16 1031)   HYDROcodone-acetaminophen (NORCO) 5-325 mg per tablet 1 Tab (1 Tab Oral Given 07/26/16 1031)   ciprofloxacin HCl (CIPRO) tablet 500 mg (500 mg Oral Given 07/26/16 1141)         Medical Decision Making    Discharged home in stable condition to follow-up with podiatrist as needed.    Final Diagnosis       ICD-10-CM ICD-9-CM   1. Puncture wound of left foot, initial encounter S91.332A 892.0   2. Foreign body (FB) in soft tissue M79.5 729.6       Disposition   The patient is discharged with verbal and written instructions and referral for ongoing care.  Puncture wound care reviewed with patient.  Prescription for Cipro ??3 days provided for prophylaxis  The patient is aware that they may return at any time for new or worsening symptoms.    Discharge Medication List as of 07/26/2016 11:15 AM      CIPRO 500 mg BID x 3 days    The patient was personally evaluated by myself and reviewed with Dr. Vinnie LangtonErik Kisa who agrees with the above assessment and plan.         Terressa KoyanagiCatherine Rivers Hamrick PA-C  Jul 26, 2016    My signature above authenticates this document and my orders, the final ??  diagnosis (es), discharge prescription (s), and instructions in the Epic ??  record.  If you have any questions please contact [phone].  ??  Nursing notes have been reviewed by the physician/ advanced practice ??  Clinician.

## 2016-07-26 NOTE — ED Notes (Signed)
11:47 AM  07/26/16     Discharge instructions given to Ashley [name] (name) with verbalization of understanding. Patient accompanied by female.  Patient discharged with the following prescription of Cipro. Patient discharged to home (destination).      Roxan Dieselegina D Kaldahl

## 2016-08-24 ENCOUNTER — Emergency Department: Admit: 2016-08-24 | Payer: PRIVATE HEALTH INSURANCE | Primary: Family Medicine

## 2016-08-24 ENCOUNTER — Inpatient Hospital Stay
Admit: 2016-08-24 | Discharge: 2016-08-24 | Disposition: A | Discharge status: Home / Self Care | Payer: PRIVATE HEALTH INSURANCE | Attending: Emergency Medicine

## 2016-08-24 DIAGNOSIS — B349 Viral infection, unspecified: Secondary | ICD-10-CM

## 2016-08-24 LAB — POC URINE MACROSCOPIC
Bilirubin: NEGATIVE
Blood: NEGATIVE
Glucose: NEGATIVE mg/dl
Ketone: NEGATIVE mg/dl
Nitrites: NEGATIVE
Protein: NEGATIVE mg/dl
Specific gravity: 1.01 (ref 1.005–1.030)
Urobilinogen: 0.2 EU/dl (ref 0.0–1.0)
pH (UA): 5.5 (ref 5–9)

## 2016-08-24 LAB — STREP AG SCREEN, GROUP A: STREP A SCREEN: NEGATIVE

## 2016-08-24 LAB — POC HCG,URINE: HCG urine, QL: NEGATIVE

## 2016-08-24 MED ORDER — BENZONATATE 200 MG CAP
200 mg | ORAL_CAPSULE | Freq: Three times a day (TID) | ORAL | 0 refills | Status: DC | PRN
Start: 2016-08-24 — End: 2016-08-31

## 2016-08-24 NOTE — ED Notes (Signed)
2:22 PM  08/24/16     Discharge instructions given to Ashley [name] (name) with verbalization of understanding. Patient was alone.  Patient discharged with the following prescription of Tessalon. Patient discharged to home (destination).      Roxan Dieselegina D Kaldahl

## 2016-08-24 NOTE — ED Provider Notes (Signed)
[facility]  Emergency Department Treatment Report    Patient: Ashley [name] Age: 31 y.o. Sex: female    Date of Birth: [birth year] Admit Date: 08/24/2016 PCP: Da Chipper Herb, MD   MRN: [id number]  CSN: [id number]  Attending: Erling Conte, MD   Room: 112/EO12 Time Dictated: 1:22 PM APP:  Lavone Nian, PA-C     Chief Complaint   Chief Complaint   Patient presents with   ??? Chest Congestion   ??? Back Pain   ??? Sore Throat         History of Present Illness   31 y.o. female who presents emergency Department with complaints of sore throat, congestion, postnasal drip, cough, ear pain, congestion or chest, shortness of breath with coughing, and low back pain that has been ongoing for the past 3 days. She states that she had a fever of 102.6 last night. She has been using Tylenol and Motrin with her last dose of Tylenol around 8:30 this morning. She denies lower extremity edema, hemoptysis, prolonged travel, birth control, hormone present therapy, surgeries, fractures, personal history of blood clots. 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    Review of Systems   Constitutional: +fever, no chills, or weight loss. No weakness.   Eyes: No blurred vision, double vision, or loss of vision.  ENT: + sore throat, +congestion +ear pain.  Respiratory: +cough, +shortness of breath with coughing. No  wheezing.  Cardiovascular: +congestion in chest. No chest pain/pressure or palpitations. No syncope.  Gastrointestinal: No abdominal pain. No nausea, vomiting, diarrhea.   Genitourinary: +urinary frequency. No painful urination, hematuria or urgency. Denies pregnancy.   Musculoskeletal: No joint pain or swelling.  Integumentary: No rashes.  Neurological: + headache, no dizziness. No extremity weakness or paresthesias.     Past Medical/Surgical History     Past Medical History:   Diagnosis Date   ??? Ill-defined condition     bronchitis   ??? Pneumonia      Past Surgical History:    Procedure Laterality Date   ??? HX CESAREAN SECTION  2002, 2004, 2008    x 3   ??? HX CESAREAN SECTION     ??? HX GASTRIC BYPASS  10-2009    [facility]   ??? HX GASTRIC BYPASS     ??? HX GASTRIC BYPASS       Social History     Social History     Social History   ??? Marital status: SINGLE     Spouse name: N/A   ??? Number of children: N/A   ??? Years of education: N/A     Social History Main Topics   ??? Smoking status: Former Smoker     Types: Cigarettes     Quit date: 11/12/2009   ??? Smokeless tobacco: Never Used   ??? Alcohol use 0.0 - 2.4 oz/week     0 - 3 Glasses of wine, 0 - 1 Shots of liquor per week   ??? Drug use: No   ??? Sexual activity: Yes     Partners: Male     Birth control/ protection: None     Other Topics Concern   ??? None     Social History Narrative    ** Merged History Encounter **          Family History     Family History   Problem Relation Age of Onset   ??? Diabetes Mother    ???  Hypertension Father    ??? Heart Disease Maternal Grandmother    ??? Heart Attack Maternal Grandmother    ??? High Cholesterol Maternal Grandmother    ??? Arthritis-osteo Maternal Grandmother    ??? Stroke Paternal Aunt    ??? Kidney Disease Maternal Aunt        Home Medications     Prior to Admission Medications   Prescriptions Last Dose Informant Patient Reported? Taking?   clonazePAM (KLONOPIN) 1 mg tablet   Yes No   Sig: Take 1 mg by mouth daily.   cyanocobalamin (B-12 DOTS) 500 mcg tablet   Yes No   Sig: Take 500 mcg by mouth daily.        Facility-Administered Medications: None     Allergies     Allergies   Allergen Reactions   ??? Aspirin Other (comments)     Stomach ulcer   ??? Ibuprofen Unknown (comments)     Gastric Bypass--advised to avoid   ??? Nsaids (Non-Steroidal Anti-Inflammatory Drug) Other (comments)     Hx gastric bypass       Physical Exam   ED Triage Vitals   Enc Vitals Group      BP 08/24/16 1031 118/66      Pulse (Heart Rate) 08/24/16 1031 100      Resp Rate 08/24/16 1031 18      Temp 08/24/16 1031 99.4 ??F (37.4 ??C)      Temp src --        O2 Sat (%) 08/24/16 1031 100 %      Weight 08/24/16 1028 195 lb      Height 08/24/16 1028 5'      Head Cir --       Peak Flow --       Pain Score --       Pain Loc --       Pain Edu? --       Excl. in GC? --      General: Patient appears well developed and well nourished.   HEENT: Conjunctiva clear. PERRL. EOMs intact. TMs clear without injection. Posterior pharynx mild erythema and edema of the tonsils without exudate. No trismus. No asymmetric edema. No uvular deviation. Mucous membranes moist, non-erythematous.   Neck: Supple, symmetrical. No masses noted. No nuchal rigidity.  Respiratory: Lungs clear to auscultation, nonlabored respirations. No wheezes, rhonchi, or rales.  Cardiovascular: Heart regular rate and rhythm without murmur, rubs or gallops.   Gastrointestinal:  Normoactive bowel sounds. Abdomen soft nondistended. No abdominal tenderness to palpation.  Musculoskeletal: Mild diffuse L4-L5 discomfort to palpation without specific spinal midline tenderness to palpation, no erythema edema or ecchymosis. No step-off deformity. Extremities upper extremity strength and sensation intact bilaterally. No peripheral edema.  Integumentary: Warm and dry without rashes or lesions.  Neurologic: Alert and oriented, moving all extremities.  No facial asymmetry or dysarthria noted.  Impression and Management Plan   Patient appears nontoxic, stable vital signs are present at this time. We will obtain rapid strep to evaluate for possible tonsillitis with strep pharyngitis. We will obtain urine and urine pregnancy test is for urinary frequency and possible UTI. Chest x-ray to evaluate for possible pneumonia.  Diagnostic Studies   Lab:   Recent Results (from the past 12 hour(s))   STREP AG SCREEN, GROUP A    Collection Time: 08/24/16 12:10 PM   Result Value Ref Range    STREP A SCREEN  Negative - No Streptococcus Group A Antigen Was Detected.  Negative - No Streptococcus Group A Antigen Was Detected.    POC URINE MACROSCOPIC    Collection Time: 08/24/16 12:13 PM   Result Value Ref Range    Glucose Negative NEGATIVE,Negative mg/dl    Bilirubin Negative NEGATIVE,Negative      Ketone Negative NEGATIVE,Negative mg/dl    Specific gravity 1.610 1.005 - 1.030      Blood Negative NEGATIVE,Negative      pH (UA) 5.5 5 - 9      Protein Negative NEGATIVE,Negative mg/dl    Urobilinogen 0.2 0.0 - 1.0 EU/dl    Nitrites Negative NEGATIVE,Negative      Leukocyte Esterase Trace (A) NEGATIVE,Negative      Color Yellow      Appearance Clear     POC HCG,URINE    Collection Time: 08/24/16 12:13 PM   Result Value Ref Range    HCG urine, QL negative NEGATIVE,Negative,negative         Imaging:    Xr Chest Pa Lat    Result Date: 08/24/2016  CHEST X-RAY, (frontal and lateral view): INDICATION: chest pain, cough, fever   COMPARISON: 01/07/2015     IMPRESSION: No acute cardiopulmonary finding. Nipple ring noted. Surgical clips present in upper abdomen on lateral view.     ED Course/ Medical Decision Making   Chest x-ray without acute findings and no evidence of pneumonia. Urinalysis without evidence of urinary tract infection. Rapid strep is negative and we'll go to culture. I have recommended the patient continue to rest and plenty of fluids use Tylenol and Tessalon Perles which I prescribed for cough if needed. She is to call her primary care doctor follow-up appointment, she will need to return to the emergency department if trouble breathing, new or worsening symptoms.    Medications - No data to display    Current Discharge Medication List      START taking these medications    Details   benzonatate (TESSALON) 200 mg capsule Take 1 Cap by mouth three (3) times daily as needed for Cough for up to 7 days.  Qty: 30 Cap, Refills: 0             Final Diagnosis       ICD-10-CM ICD-9-CM   1. Viral illness B34.9 079.99       Disposition   Discharge      Discussed with Erling Conte, MD who agrees with the above assessment and plan     Chad, PA-C  August 24, 2016    My signature above authenticates this document and my orders, the final ??  diagnosis (es), discharge prescription (s), and instructions in the Epic ??  record.  If you have any questions please contact [phone].  ??  Nursing notes have been reviewed by the physician/ advanced practice ??  Clinician.

## 2016-08-24 NOTE — ED Triage Notes (Signed)
Pt c/o of sore throat/chest congestions and back pain for 3 days

## 2016-08-26 LAB — THROAT CULTURE

## 2016-08-30 ENCOUNTER — Emergency Department: Admit: 2016-08-31 | Payer: MEDICAID | Primary: Family Medicine

## 2016-08-30 ENCOUNTER — Inpatient Hospital Stay
Admit: 2016-08-30 | Discharge: 2016-08-31 | Disposition: A | Discharge status: Home / Self Care | Payer: MEDICAID | Attending: Emergency Medicine

## 2016-08-30 ENCOUNTER — Observation Stay

## 2016-08-30 DIAGNOSIS — R079 Chest pain, unspecified: Secondary | ICD-10-CM

## 2016-08-30 NOTE — ED Provider Notes (Signed)
[facility]  Emergency Department Treatment Report    Patient: Ashley [name] Age: 31 y.o. Sex: female    Date of Birth: 1985/03/23 Admit Date: 08/30/2016 PCP: Da Roosevelt Locks, MD   MRN: [id number]  CSN: [id number]  Attn: Maylon Cos, MD   Room: ER05/ER05 Time Dictated: 8:20 PM APP: Fuller Song, PA-C       Chief Complaint   Chest pain  History of Present Illness   31 y.o. female presents with a complaint of chest pain that started left-sided today last night it started in her back is worse with movement and position change coughing sneezing.  Describes the pain as aching sharp pain.  She's been coughing for about 2 weeks she feels like she has a lot of chest congestion unable to cough anything up.  She's had no fever that she's been aware of but she has been taking analgesics.  She's had no leg pain or swelling she's not on any hormones.  She states it is sore to press on the left side of her chest.    Review of Systems   Review of Systems   Constitutional:        Question fever no chills   HENT:        No URI symptoms   Eyes: Negative for discharge and redness.   Respiratory: Positive for cough, shortness of breath and wheezing. Negative for hemoptysis and sputum production.         Wheezing over the last few days   Cardiovascular: Positive for chest pain. Negative for palpitations and leg swelling.        Left-sided chest pain that she describes as kind of an aching sharp pain that is worse with movement and position change sneezing and coughing   Gastrointestinal: Negative for abdominal pain, diarrhea, nausea and vomiting.   Musculoskeletal: Negative for neck pain.        Left upper back pain is worse with movement and position change and breathing; no leg pain or swelling   Skin: Negative for rash.   Neurological: Negative for dizziness and headaches.        No paresthesias or weakness   All other systems reviewed and are negative.      Past Medical/Surgical History    LMP was the end of May denies any chance of pregnancy history of irregular menses  Past Medical History:   Diagnosis Date   ??? Ill-defined condition     bronchitis   ??? Pneumonia      Past Surgical History:   Procedure Laterality Date   ??? HX CESAREAN SECTION  2002, 2004, 2008    x 3   ??? HX CESAREAN SECTION     ??? 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 HX GASTRIC BYPASS     ??? HX GASTRIC BYPASS       Social History     Social History     Social History   ??? Marital status: SINGLE     Spouse name: N/A   ??? Number of children: N/A   ??? Years of education: N/A     Social History Main Topics   ??? Smoking status: Former Smoker     Types: Cigarettes     Quit date: 11/12/2009   ??? Smokeless tobacco: Never Used   ??? Alcohol use 0.0 - 2.4 oz/week     0 - 3 Glasses of wine, 0 - 1 Shots of  liquor per week      Comment: occassional   ??? Drug use: No   ??? Sexual activity: Yes     Partners: Male     Birth control/ protection: None     Other Topics Concern   ??? None     Social History Narrative    ** Merged History Encounter **        She quit smoking about 3 years ago no travel    Family History     Family History   Problem Relation Age of Onset   ??? Diabetes Mother    ??? Hypertension Father    ??? Heart Disease Maternal Grandmother    ??? Heart Attack Maternal Grandmother    ??? High Cholesterol Maternal Grandmother    ??? Arthritis-osteo Maternal Grandmother    ??? Stroke Paternal Aunt    ??? Kidney Disease Maternal Aunt      History of blood clots on maternal side of the family  Current Medications     Current Facility-Administered Medications   Medication Dose Route Frequency Provider Last Rate Last Dose   ??? sodium chloride (NS) flush 5-10 mL  5-10 mL IntraVENous Q8H Fuller Song, PA-C       ??? sodium chloride (NS) flush 5-10 mL  5-10 mL IntraVENous PRN Fuller Song, PA-C       ??? aspirin chewable tablet 162 mg  162 mg Oral NOW Fuller Song, PA-C       ??? albuterol-ipratropium (DUO-NEB) 2.5 MG-0.5 MG/3 ML  3 mL Nebulization  NOW Fuller Song, PA-C         Current Outpatient Prescriptions   Medication Sig Dispense Refill   ??? benzonatate (TESSALON) 200 mg capsule Take 1 Cap by mouth three (3) times daily as needed for Cough for up to 7 days. 30 Cap 0   ??? clonazePAM (KLONOPIN) 1 mg tablet Take 1 mg by mouth daily.     ??? cyanocobalamin (B-12 DOTS) 500 mcg tablet Take 500 mcg by mouth daily.           Allergies     Allergies   Allergen Reactions   ??? Aspirin Other (comments)     Stomach ulcer   ??? Ibuprofen Unknown (comments)     Gastric Bypass--advised to avoid   ??? Nsaids (Non-Steroidal Anti-Inflammatory Drug) Other (comments)     Hx gastric bypass       Physical Exam     Visit Vitals   ??? BP 112/70   ??? Pulse 66   ??? Temp 98.6 ??F (37 ??C)   ??? Resp 20   ??? Ht 5' (1.524 m)   ??? Wt 85.7 kg (189 lb)   ??? SpO2 100%   ??? BMI 36.91 kg/m2     Physical Exam   Constitutional: She is oriented to person, place, and time.   Very pleasant female sitting up on the stretcher   HENT:   Mouth/Throat: Oropharynx is clear and moist.   Eyes: Conjunctivae are normal. Pupils are equal, round, and reactive to light. No scleral icterus.   Neck: Normal range of motion. Neck supple.   Cardiovascular: Normal rate, regular rhythm and intact distal pulses.    Pulmonary/Chest: Effort normal and breath sounds normal. She exhibits no tenderness.   Abdominal: Soft. There is no tenderness.   Musculoskeletal:   Extremities warm dry well perfused and nontender   Neurological: She is alert and oriented to person, place, and time.   Skin:  Skin is warm and dry. No rash noted. No erythema.   Psychiatric: Memory, affect and judgment normal.   Vitals reviewed.      Impression and Management Plan   70-year-old female presents for evaluation of chest pain that started today with some back pain as started yesterday pleuritic worse with movement and position change does sound more musculoskeletal.  She has been coughing.  At this time she's been placed on blood pressure, pulse oximetry cardiac  monitors.  Baseline lab work will be obtained including a CBC CMP and lipase given her history of gastric bypass and also complaining of some pain "under her diaphragm" troponin will be obtained chest x-ray and EKG and a d-dimer with the pleuritic component and history of clots in the family.  She will be medicated with some morphine Zofran intravenously    Diagnostic Studies   Lab:   Recent Results (from the past 12 hour(s))   CBC WITH AUTOMATED DIFF    Collection Time: 08/30/16  7:30 PM   Result Value Ref Range    WBC 5.8 4.0 - 11.0 1000/mm3    RBC 3.46 (L) 3.60 - 5.20 M/uL    HGB 9.6 (L) 13.0 - 17.2 gm/dl    HCT 30.3 (L) 37.0 - 50.0 %    MCV 87.6 80.0 - 98.0 fL    MCH 27.7 25.4 - 34.6 pg    MCHC 31.7 30.0 - 36.0 gm/dl    PLATELET 285 140 - 450 1000/mm3    MPV 11.7 (H) 6.0 - 10.0 fL    RDW-SD 56.0 (H) 36.4 - 46.3      NRBC 0 0 - 0      IMMATURE GRANULOCYTES 0.3 0.0 - 3.0 %    NEUTROPHILS 50.9 34 - 64 %    LYMPHOCYTES 38.4 28 - 48 %    MONOCYTES 9.4 1 - 13 %    EOSINOPHILS 0.7 0 - 5 %    BASOPHILS 0.3 0 - 3 %   D DIMER    Collection Time: 08/30/16  7:30 PM   Result Value Ref Range    D DIMER 0.71 (H) 0.01 - 0.50 ug/mL (FEU)   METABOLIC PANEL, COMPREHENSIVE    Collection Time: 08/30/16  7:30 PM   Result Value Ref Range    Sodium 140 136 - 145 mEq/L    Potassium 3.7 3.5 - 5.1 mEq/L    Chloride 111 (H) 98 - 107 mEq/L    CO2 20 (L) 21 - 32 mEq/L    Glucose 78 74 - 106 mg/dl    BUN 6 (L) 7 - 25 mg/dl    Creatinine 0.7 0.6 - 1.3 mg/dl    GFR est AA >60.0      GFR est non-AA >60      Calcium 8.1 (L) 8.5 - 10.1 mg/dl    AST (SGOT) 25 15 - 37 U/L    ALT (SGPT) 18 12 - 78 U/L    Alk. phosphatase 125 (H) 45 - 117 U/L    Bilirubin, total 0.2 0.2 - 1.0 mg/dl    Protein, total 7.1 6.4 - 8.2 gm/dl    Albumin 2.9 (L) 3.4 - 5.0 gm/dl    Anion gap 10 5 - 15 mmol/L   LIPASE    Collection Time: 08/30/16  7:30 PM   Result Value Ref Range    Lipase 69 (L) 73 - 393 U/L   TROPONIN I    Collection Time: 08/30/16  8:45 PM  Result Value Ref Range    Troponin-I <0.015 0.000 - 0.045 ng/ml   POC HCG,URINE    Collection Time: 08/30/16  8:59 PM   Result Value Ref Range    HCG urine, QL negative NEGATIVE,Negative,negative     EKG shows a sinus rhythm with a ventricular rate of 78 a PR 142 and QRS 82 at QT of 382 and QTc of 435 with no acute change to suggest ischemia as read by ED attending    Imaging:    Xr Chest Pa Lat    Result Date: 08/30/2016  Clinical history: Left-sided chest pain EXAMINATION: PA and lateral views of the chest 08/30/2016 Correlation: 08/24/2016 FINDINGS: Trachea and cardiomediastinal silhouette are within normal limits. Lungs are clear.     IMPRESSION: No acute pulmonary process.     Cta Chest W Or W Wo Cont    Result Date: 08/30/2016  Clinical history: Chest pain, elevated d-dimer EXAMINATION: CTA chest with contrast. 3 mm spiral scanning is performed from the lung apices to the upper poles of the kidneys. Coronal, sagittal and 3-D MIP sequences have been obtained. Correlation: Chest radiograph 08/30/2016 FINDINGS: Trachea, right and left mainstem bronchi are patent. Mosaic attenuation of the lungs likely related to air trapping. Wedge-shaped and nodular opacities right lung base likely represent atelectasis, short-term follow-up suggested. Visualized portions of the thyroid gland is unremarkable. Patient is status post gastric bypass surgery. No aneurysm or dissection thoracic aorta. No lymph node enlargement in the axilla, mediastinum or hila. No pulmonary embolism. Visualized portions of the liver, gallbladder, spleen, adrenal glands, pancreas and upper poles of both kidneys are unremarkable.     IMPRESSION: 1. No pulmonary embolism. 2. Wedge-shaped and nodular opacities right lung base likely related to atelectasis, short-term follow-up suggested to confirm resolution. 3. Gastric bypass surgery.     Procedures    Medical Decision Making/ED Course    Findings were discussed.  I have discussed that she will need to follow-up of this area seen on her CT scan.  We will be assigning her to the ED observation unit for trending of her troponins and stress echo in the morning.  She is comfortable with this plan.  We will give her dose of aspirin now.  If her workup is negative from a cardiac standpoint as suspect this is more musculoskeletal.  She is comfortable with this plan.  She has been ordered and DuoNeb treatments for wheezing.  Medications   sodium chloride (NS) flush 5-10 mL (not administered)   sodium chloride (NS) flush 5-10 mL (not administered)   aspirin chewable tablet 162 mg (not administered)   albuterol-ipratropium (DUO-NEB) 2.5 MG-0.5 MG/3 ML (not administered)   sodium chloride 0.9 % bolus infusion 1,000 mL (1,000 mL IntraVENous New Bag 08/30/16 2028)   morphine injection 4 mg (4 mg IntraVENous Given 08/30/16 2029)   ondansetron (ZOFRAN) injection 4 mg (4 mg IntraVENous Given 08/30/16 2030)   sodium chloride (NS) flush 5-10 mL (10 mL IntraVENous Given 08/30/16 2029)   iopamidol (ISOVUE-370) 76 % injection 80 mL (80 mL IntraVENous Given 08/30/16 2123)       Final Diagnosis       ICD-10-CM ICD-9-CM   1. Acute chest pain R07.9 786.50   2. Acute bronchospasm J98.01 519.11       Disposition   Patient assigned to the ED observation unit in stable condition.  Patient was examined and evaluated by myself and Dr. Loni Muse who agrees with assessment and plan  Leatrice Jewels, PA-C  August 30, 2016    My signature above authenticates this document and my orders, the final ??  diagnosis (es), discharge prescription (s), and instructions in the Epic ??  record.  If you have any questions please contact [phone].  Dragon medical dictation software was used for portions of this report. Unintended voice recognition errors may occur.

## 2016-08-30 NOTE — Other (Signed)
TRANSFER - OUT REPORT:    Verbal report given to Corralie RN (name) on Ashley [name]  being transferred to ED OBS (unit) for routine progression of care       Report consisted of patient???s Situation, Background, Assessment and   Recommendations(SBAR).     Information from the following report(s) SBAR was reviewed with the receiving nurse.    Lines:   Peripheral IV 08/30/16 Right Antecubital (Active)   Site Assessment Clean, dry, & intact 08/30/2016  7:38 PM   Phlebitis Assessment 0 08/30/2016  7:38 PM   Infiltration Assessment 0 08/30/2016  7:38 PM   Dressing Status Clean, dry, & intact 08/30/2016  7:38 PM   Dressing Type Transparent 08/30/2016  7:38 PM   Alcohol Cap Used Yes 08/30/2016  7:38 PM        Opportunity for questions and clarification was provided.      Patient transported with:   [employer]

## 2016-08-30 NOTE — ED Triage Notes (Signed)
Patient was just here about a week ago to be evaluated for cough.  States back pain started then now has left sided chest pain constant dull that increases with movement and cough.  Patient states family history of blood clots from unknown reasons.

## 2016-08-30 NOTE — ED Progress Note (Signed)
Cpp; msk if neg; follow-up on CTA findings (wedge area RLL)

## 2016-08-30 NOTE — ED Notes (Signed)
Medicated per MAR.    breathing tx completed

## 2016-08-30 NOTE — ED Notes (Signed)
Repeat Trop being completed at bedside

## 2016-08-30 NOTE — ED Notes (Addendum)
A&O x4 and follows commands appropriately. RR even and nonlabored. Skin warm dry and no abnormal skin color noted.     c/o chest pain when coughing and movement.    States that cough has gotten worse.    Call bell within reach.    NSR on cardiac monitor.    GN:[id number]:bronchitis and PNA

## 2016-08-31 LAB — EKG 12-LEAD
Atrial Rate: 65 {beats}/min
Atrial Rate: 78 {beats}/min
Diagnosis: NORMAL
Diagnosis: NORMAL
P Axis: 46 degrees
P Axis: 52 degrees
P-R Interval: 142 ms
P-R Interval: 156 ms
Q-T Interval: 382 ms
Q-T Interval: 412 ms
QRS Duration: 78 ms
QRS Duration: 82 ms
QTc Calculation (Bazett): 428 ms
QTc Calculation (Bazett): 435 ms
R Axis: 34 degrees
R Axis: 40 degrees
T Axis: 34 degrees
T Axis: 41 degrees
Ventricular Rate: 65 {beats}/min
Ventricular Rate: 78 {beats}/min

## 2016-08-31 LAB — ECHO STRESS
Duke treadmill score: 0
Duke treadmill score: 0
ECG Interp During Ex: NORMAL
ECG Interp. During Exercise: NORMAL
Functional Capacity: NORMAL
Functional capacity: NORMAL
Max Diastolic BP: 74 mmHg
Max Heart Rate: 176 {beats}/min
Max Systolic BP: 135 mmHg
Max. Diastolic BP: 74 mmHg
Max. Heart rate: 176 {beats}/min
Max. Systolic BP: 135 mmHg
Overall BP Response To Exercise: NORMAL
Overall BP response to exercise: NORMAL
Overall HR Response To Exercise: NORMAL
Overall HR response to exercise: NORMAL
Peak EX METS: 1.3 METS
Peak Ex METs: 1.3 METS

## 2016-08-31 LAB — D-DIMER, QUANTITATIVE: D-Dimer, Quant: 0.71 ug/mL (FEU) — ABNORMAL HIGH (ref 0.01–0.50)

## 2016-08-31 LAB — METABOLIC PANEL, COMPREHENSIVE
ALT (SGPT): 18 U/L (ref 12–78)
AST (SGOT): 25 U/L (ref 15–37)
Albumin: 2.9 gm/dl — ABNORMAL LOW (ref 3.4–5.0)
Alk. phosphatase: 125 U/L — ABNORMAL HIGH (ref 45–117)
Anion gap: 10 mmol/L (ref 5–15)
BUN: 6 mg/dl — ABNORMAL LOW (ref 7–25)
Bilirubin, total: 0.2 mg/dl (ref 0.2–1.0)
CO2: 20 mEq/L — ABNORMAL LOW (ref 21–32)
Calcium: 8.1 mg/dl — ABNORMAL LOW (ref 8.5–10.1)
Chloride: 111 mEq/L — ABNORMAL HIGH (ref 98–107)
Creatinine: 0.7 mg/dl (ref 0.6–1.3)
GFR est AA: >60
GFR est non-AA: >60
Glucose: 78 mg/dl (ref 74–106)
Potassium: 3.7 mEq/L (ref 3.5–5.1)
Protein, total: 7.1 gm/dl (ref 6.4–8.2)
Sodium: 140 mEq/L (ref 136–145)

## 2016-08-31 LAB — EKG, 12 LEAD, INITIAL
Atrial Rate: 65 {beats}/min
Atrial Rate: 78 {beats}/min
Calculated P Axis: 46 degrees
Calculated P Axis: 52 degrees
Calculated R Axis: 34 degrees
Calculated R Axis: 40 degrees
Calculated T Axis: 34 degrees
Calculated T Axis: 41 degrees
Diagnosis: NORMAL
Diagnosis: NORMAL
P-R Interval: 142 ms
P-R Interval: 156 ms
Q-T Interval: 382 ms
Q-T Interval: 412 ms
QRS Duration: 78 ms
QRS Duration: 82 ms
QTC Calculation (Bezet): 428 ms
QTC Calculation (Bezet): 435 ms
Ventricular Rate: 65 {beats}/min
Ventricular Rate: 78 {beats}/min

## 2016-08-31 LAB — CBC WITH AUTOMATED DIFF
BASOPHILS: 0.3 % (ref 0–3)
EOSINOPHILS: 0.7 % (ref 0–5)
HCT: 30.3 % — ABNORMAL LOW (ref 37.0–50.0)
HGB: 9.6 gm/dl — ABNORMAL LOW (ref 13.0–17.2)
IMMATURE GRANULOCYTES: 0.3 % (ref 0.0–3.0)
LYMPHOCYTES: 38.4 % (ref 28–48)
MCH: 27.7 pg (ref 25.4–34.6)
MCHC: 31.7 gm/dl (ref 30.0–36.0)
MCV: 87.6 fL (ref 80.0–98.0)
MONOCYTES: 9.4 % (ref 1–13)
MPV: 11.7 fL — ABNORMAL HIGH (ref 6.0–10.0)
NEUTROPHILS: 50.9 % (ref 34–64)
NRBC: 0 (ref 0–0)
PLATELET: 285 10*3/uL (ref 140–450)
RBC: 3.46 M/uL — ABNORMAL LOW (ref 3.60–5.20)
RDW-SD: 56 — ABNORMAL HIGH (ref 36.4–46.3)
WBC: 5.8 10*3/uL (ref 4.0–11.0)

## 2016-08-31 LAB — TROPONIN I
Troponin-I: <0.015 ng/ml (ref 0.000–0.045)
Troponin-I: <0.015 ng/ml (ref 0.000–0.045)

## 2016-08-31 LAB — LIPASE: Lipase: 69 U/L — ABNORMAL LOW (ref 73–393)

## 2016-08-31 LAB — POC TROPONIN: Troponin-I: 0 ng/ml (ref 0.00–0.07)

## 2016-08-31 LAB — POC HCG,URINE: HCG urine, QL: NEGATIVE

## 2016-08-31 LAB — D DIMER: D DIMER: 0.71 ug/mL (FEU) — ABNORMAL HIGH (ref 0.01–0.50)

## 2016-08-31 MED ORDER — IPRATROPIUM-ALBUTEROL 2.5 MG-0.5 MG/3 ML NEB SOLUTION
2.5 mg-0.5 mg/3 ml | RESPIRATORY_TRACT | Status: AC
Start: 2016-08-31 — End: 2016-08-31

## 2016-08-31 MED ORDER — METAXALONE 800 MG TAB
800 mg | ORAL_TABLET | Freq: Four times a day (QID) | ORAL | 0 refills | Status: AC | PRN
Start: 2016-08-31 — End: 2016-09-05

## 2016-08-31 MED ORDER — IPRATROPIUM-ALBUTEROL 2.5 MG-0.5 MG/3 ML NEB SOLUTION
2.5 mg-0.5 mg/3 ml | RESPIRATORY_TRACT | Status: AC
Start: 2016-08-31 — End: 2016-08-31
  Administered 2016-08-31: 13:00:00 via RESPIRATORY_TRACT

## 2016-08-31 MED ORDER — ALBUTEROL SULFATE HFA 90 MCG/ACTUATION AEROSOL INHALER
90 mcg/actuation | RESPIRATORY_TRACT | 0 refills | Status: DC
Start: 2016-08-31 — End: 2017-03-19

## 2016-08-31 MED ORDER — ALBUTEROL SULFATE 0.083 % (0.83 MG/ML) SOLN FOR INHALATION
2.5 mg /3 mL (0.083 %) | RESPIRATORY_TRACT | Status: DC | PRN
Start: 2016-08-31 — End: 2016-08-31
  Administered 2016-08-31: 20:00:00 via RESPIRATORY_TRACT

## 2016-08-31 MED ORDER — SODIUM CHLORIDE 0.9 % IJ SYRG
INTRAMUSCULAR | Status: DC | PRN
Start: 2016-08-31 — End: 2016-08-31

## 2016-08-31 MED ORDER — ACETAMINOPHEN 325 MG TABLET
325 mg | ORAL | Status: AC
Start: 2016-08-31 — End: 2016-08-31
  Administered 2016-08-31: 10:00:00 via ORAL

## 2016-08-31 MED ORDER — IPRATROPIUM-ALBUTEROL 2.5 MG-0.5 MG/3 ML NEB SOLUTION
2.5 mg-0.5 mg/3 ml | RESPIRATORY_TRACT | Status: AC
Start: 2016-08-31 — End: 2016-08-30
  Administered 2016-08-31: 03:00:00 via RESPIRATORY_TRACT

## 2016-08-31 MED ORDER — ONDANSETRON (PF) 4 MG/2 ML INJECTION
4 mg/2 mL | Freq: Once | INTRAMUSCULAR | Status: AC
Start: 2016-08-31 — End: 2016-08-30
  Administered 2016-08-31: 01:00:00 via INTRAVENOUS

## 2016-08-31 MED ORDER — MORPHINE 4 MG/ML SYRINGE
4 mg/mL | INTRAMUSCULAR | Status: AC
Start: 2016-08-31 — End: 2016-08-30

## 2016-08-31 MED ORDER — MORPHINE 4 MG/ML SYRINGE
4 mg/mL | INTRAMUSCULAR | Status: AC
Start: 2016-08-31 — End: 2016-08-30
  Administered 2016-08-31: via INTRAVENOUS

## 2016-08-31 MED ORDER — ACETAMINOPHEN 325 MG TABLET
325 mg | ORAL | Status: AC
Start: 2016-08-31 — End: 2016-08-31
  Administered 2016-08-31: 03:00:00

## 2016-08-31 MED ORDER — HYDROCODONE 10 MG-CHLORPHENIRAMINE 8 MG/5 ML ORAL SUSP EXTEND.REL 12HR
10-8 mg/5 mL | Freq: Two times a day (BID) | ORAL | 0 refills | Status: DC | PRN
Start: 2016-08-31 — End: 2018-01-20

## 2016-08-31 MED ORDER — ASPIRIN 81 MG CHEWABLE TAB
81 mg | ORAL | Status: AC
Start: 2016-08-31 — End: 2016-08-30

## 2016-08-31 MED ORDER — HYDROCODONE 10 MG-CHLORPHENIRAMINE 8 MG/5 ML ORAL SUSP EXTEND.REL 12HR
10-8 mg/5 mL | ORAL | Status: AC
Start: 2016-08-31 — End: 2016-08-31
  Administered 2016-08-31: 05:00:00 via ORAL

## 2016-08-31 MED ORDER — BENZONATATE 100 MG CAP
100 mg | Freq: Three times a day (TID) | ORAL | Status: DC | PRN
Start: 2016-08-31 — End: 2016-08-31

## 2016-08-31 MED ORDER — IPRATROPIUM-ALBUTEROL 2.5 MG-0.5 MG/3 ML NEB SOLUTION
2.5 mg-0.5 mg/3 ml | RESPIRATORY_TRACT | Status: AC
Start: 2016-08-31 — End: 2016-08-31
  Administered 2016-08-31: 10:00:00 via RESPIRATORY_TRACT

## 2016-08-31 MED ORDER — SODIUM CHLORIDE 0.9% BOLUS IV
0.9 % | INTRAVENOUS | Status: AC
Start: 2016-08-31 — End: 2016-08-31
  Administered 2016-08-31: 13:00:00 via INTRAVENOUS

## 2016-08-31 MED ORDER — IOPAMIDOL 76 % IV SOLN
370 mg iodine /mL (76 %) | Freq: Once | INTRAVENOUS | Status: AC
Start: 2016-08-31 — End: 2016-08-30
  Administered 2016-08-31: 01:00:00 via INTRAVENOUS

## 2016-08-31 MED ORDER — SODIUM CHLORIDE 0.9 % IJ SYRG
Freq: Three times a day (TID) | INTRAMUSCULAR | Status: DC
Start: 2016-08-31 — End: 2016-08-31
  Administered 2016-08-31: 03:00:00 via INTRAVENOUS

## 2016-08-31 MED ORDER — CLONIDINE 0.1 MG TAB
0.1 mg | Freq: Once | ORAL | Status: DC | PRN
Start: 2016-08-31 — End: 2016-08-31

## 2016-08-31 MED ORDER — ACETAMINOPHEN 325 MG TABLET
325 mg | Freq: Four times a day (QID) | ORAL | Status: DC | PRN
Start: 2016-08-31 — End: 2016-08-31
  Administered 2016-08-31 (×2): via ORAL

## 2016-08-31 MED ORDER — SODIUM CHLORIDE 0.9% BOLUS IV
0.9 % | INTRAVENOUS | Status: AC
Start: 2016-08-31 — End: 2016-08-31
  Administered 2016-08-31: via INTRAVENOUS

## 2016-08-31 MED ORDER — HYDROCODONE 10 MG-CHLORPHENIRAMINE 8 MG/5 ML ORAL SUSP EXTEND.REL 12HR
10-8 mg/5 mL | ORAL | Status: AC
Start: 2016-08-31 — End: 2016-08-31
  Administered 2016-08-31: 18:00:00 via ORAL

## 2016-08-31 MED ORDER — SODIUM CHLORIDE 0.9 % IJ SYRG
Freq: Once | INTRAMUSCULAR | Status: AC
Start: 2016-08-31 — End: 2016-08-30
  Administered 2016-08-31: via INTRAVENOUS

## 2016-08-31 MED ORDER — ASPIRIN 81 MG CHEWABLE TAB
81 mg | ORAL | Status: AC
Start: 2016-08-31 — End: 2016-08-30
  Administered 2016-08-31: 03:00:00 via ORAL

## 2016-08-31 MED FILL — ASPIRIN 81 MG CHEWABLE TAB: 81 mg | ORAL | Qty: 2

## 2016-08-31 MED FILL — MORPHINE 4 MG/ML SYRINGE: 4 mg/mL | INTRAMUSCULAR | Qty: 1

## 2016-08-31 MED FILL — IPRATROPIUM-ALBUTEROL 2.5 MG-0.5 MG/3 ML NEB SOLUTION: 2.5 mg-0.5 mg/3 ml | RESPIRATORY_TRACT | Qty: 3

## 2016-08-31 MED FILL — ACETAMINOPHEN 325 MG TABLET: 325 mg | ORAL | Qty: 2

## 2016-08-31 MED FILL — SODIUM CHLORIDE 0.9 % IV: INTRAVENOUS | Qty: 1000

## 2016-08-31 MED FILL — HYDROCODONE 10 MG-CHLORPHENIRAMINE 8 MG/5 ML ORAL SUSP EXTEND.REL 12HR: 10-8 mg/5 mL | ORAL | Qty: 5

## 2016-08-31 MED FILL — ALBUTEROL SULFATE 0.083 % (0.83 MG/ML) SOLN FOR INHALATION: 2.5 mg /3 mL (0.083 %) | RESPIRATORY_TRACT | Qty: 1

## 2016-08-31 MED FILL — ONDANSETRON (PF) 4 MG/2 ML INJECTION: 4 mg/2 mL | INTRAMUSCULAR | Qty: 2

## 2016-08-31 MED FILL — ISOVUE-370  76 % INTRAVENOUS SOLUTION: 370 mg iodine /mL (76 %) | INTRAVENOUS | Qty: 80

## 2016-08-31 MED FILL — ACETAMINOPHEN 325 MG TABLET: 325 mg | ORAL | Qty: 3

## 2016-08-31 NOTE — Discharge Summary (Signed)
[facility]  Emergency ObservationDepartment   Chest Pain Discharge Summary    Patient: Ashley [name] Age: 31 y.o. Sex: female    Date of Birth: 04-Jun-1985 Admit Date: 08/30/2016 PCP: Da Chipper Herb, MD   MRN: [id number]  CSN: [id number]     Room: 115/EO15       Discharge Physician:   Dr. Benn Moulder  Date/Time of Observation Admission:  August 30, 2016 10:39 PM  Date/Time of Observation Discharge:  August 31, 2016 3:20 PM    History of Present Illness   31 y.o. female was seen in the ED for evaulation of chest pain.  The initial evaluation was unremarkable and subsequently the patient was assigned to ED Observation for further risk stratification to include repeat cardiac enzymes, and if negative, cardiac stress testing.     ED Observation Course   The patient remained stable.  They had intermittent cough/bronchospasm treated with neb treatments/tussionex PO.  Left sided chest pain was constant, pleuritic.    Discussed atelectasis seen on chest CTA last night- follow-up with PCP.  Discussed nature of chest wall inflammation/bronchitis and pleurisy.  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  Cautioned about drowsiness, possible constipation with Tussionex.    All Cardiac Markers in the last 24 hours:   Lab Results   Component Value Date/Time    TROPT <0.015 08/31/2016 02:45 AM    TROPT 0.00 08/30/2016 11:53 PM    TROPT <0.015 08/30/2016 08:45 PM       They underwent exercise stress echocardiography.  Results of testing were negative for ischemia interpreted by Dr. Constance Haw.  The test was an adequate study with target heart rate achieved.    Echo Results  (Last 48 hours)               08/31/16 1318  ECHO STRESS Final result    Narrative:  ==================== XCELERA REPORT ========================================                                                                         Study ID: [id number]                                                     [facility]                                                     [address] [place], [place] [address]  Stress Echocardiogram Report       Name: Ashley, [name] Date:       08/31/2016 01:18 PM   MRN: [id number]                 Patient Location: ER^ER05^ER05   DOB: 1985-04-17             Age: 87 yrs   Height: 60 in               Weight: 189 lb                        BSA: 1.8 m2   BP: 118/65 mmHg             HR: 67   Gender: Female              Account #: [id number]   Reason For Study: Chest Pain   History: gastric bypass,former smoker   Ordering Physician: Mikal Plane   Performed By: Nancie Neas., RDCS       Interpretation Summary   The Electrocardiographic Interpretation: negative by ECG criteria.   There is normal wall motion at rest. There is appropriate augmentation of   myocardial contractility following exercise.   The OverAll Impression : negative for ischemia .       _____________________________________________________________________________   _       Interpretation Summary       Stress Results              Protocol:  Bruce              Target HR: 160 bpm        Maximum Predicted HR: 188 bpm                           Stress Duration:  7:55 mm:ss *                      Maximum Stress HR:  176 bpm *           Stress Comments   A treadmill exercise test according to Bruce protocol was performed. The   baseline ECG displays normal sinus rhythm. There no ST segment changes during   stress. No arrhythmias were noted during stress. Test was terminated due to   target heart rate was achieved. Patient developed no symptoms. Normal blood   pressure response.       I      WMSI = 1.00     % Normal = 100                                                                   rest                                                                        II      WMSI = 1.00     %  Normal = 100                                                                   peak                                                                                                                               Segments     Size   X - Cannot               2 -                       4 -            1-2       small   Interpret    1 - Normal  Hypokinetic  3 - Akinetic Dyskinetic     3-5   moder   ate   5 -                                                               6-14      large   Aneurysmal                                                       15-16  diffu   se               Left Ventricle   The left ventricular chamber size at rest is normal. The left ventricular   chamber size at peak stress is smaller.       MEASUREMENTS/CALCULATIONS:           Electronically signed byDr Burgess Estelleobert D McCray, MD   08/31/2016 02:51 PM                 Physical Exam     Visit Vitals   ??? BP 111/69 (BP 1 Location: Left arm, BP Patient Position: Supine)   ??? Pulse 80   ??? Temp 98.1 ??F (36.7 ??C)   ??? Resp 18   ??? Ht 5' (1.524 m)   ??? Wt 85.7 kg (189 lb)   ??? SpO2 100%   ??? BMI 36.91 kg/m2       Respiratory: Clear and equal breath sounds.  No respiratory distress.  Cardiovascular: Heart regular without  murmer.     GI:  Abdomen soft non-tender without complaints of pain to palpation.    Clinical Impression/Diagnosis       ICD-10-CM ICD-9-CM   1. Acute chest pain R07.9 786.50   2. Acute bronchospasm J98.01 519.11   3. Pleurisy R09.1 511.0       Disposition and Plan   Patient is discharged home in stable condition. They were advised to follow-up with primary care in one week to have a recheck. Copies of all ED testing and stress tests results were provided to the patient to take with them to their appointment.      Rx Albuterol, Skelaxin, Tussionex  Knows to see PCP for follow-up and follow-up of CT results.    It was discussed with the patient that their cardiac evaluation was unremarkable and does not  indicate that immediate intervention is necessary. The patient was counseled that the testing is not 100% accurate and may generate false negatives.     Patient advised to return to the ED for any new or worsening symptoms.     The patient was personally evaluated by myself and discussed with Dr. Oneida Alarheung who agrees with the above assessment and plan.    Julien GirtErin E Maigan Bittinger, PA-C  August 31, 2016    [facility] CenterDragon medical dictation software was used for portions of this report. Unintended voice recognition errors may occur.

## 2016-08-31 NOTE — ED Notes (Signed)
Warm compress applied to both chest and back for comfort.

## 2016-08-31 NOTE — ED Notes (Addendum)
TRANSFER - IN REPORT:    Verbal report received from [place] (name) on Ashley [name]  being received from ED(unit) for routine progression of care      Report consisted of patient???s Situation, Background, Assessment and   Recommendations(SBAR).     Information from the following report(s) SBAR, Kardex, ED Summary, MAR, Recent Results, Cardiac Rhythm SR and Alarm Parameters  was reviewed with the receiving nurse.    Opportunity for questions and clarification was provided.      Assessment completed upon patient???s arrival to unit and care assumed.

## 2016-08-31 NOTE — ED Notes (Signed)
4:12 PM  08/31/16     Discharge instructions given to Ashley [name] (name) with verbalization of understanding. Patient accompanied by female.  Patient discharged with the following prescriptions of Albuterol inhaler, Metaxalone, Tussionex. Patient discharged to home (destination).      Roxan Dieselegina D Kaldahl

## 2016-08-31 NOTE — Discharge Summary (Signed)
[facility]  Emergency ObservationDepartment   Chest Pain Discharge Summary    Patient: Ashley [name] Age: 31 y.o. Sex: female    Date of Birth: 1985-11-26 Admit Date: 08/30/2016 PCP: Da Chipper Herb, MD   MRN: [id number]  CSN: [id number]     Room: 115/EO15       Discharge Physician:   Dr. Benn Moulder  Date/Time of Observation Admission:  August 30, 2016 10:39 PM  Date/Time of Observation Discharge:  August 31, 2016 3:20 PM    History of Present Illness   31 y.o. female was seen in the ED for evaulation of chest pain.  The initial evaluation was unremarkable and subsequently the patient was assigned to ED Observation for further risk stratification to include repeat cardiac enzymes, and if negative, cardiac stress testing.     ED Observation Course   The patient remained stable.  They had intermittent cough/bronchospasm treated with neb treatments/tussionex PO.  Left sided chest pain was constant, pleuritic.    Discussed atelectasis seen on chest CTA last night- follow-up with PCP.  Discussed nature of chest wall inflammation/bronchitis and pleurisy.  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  Cautioned about drowsiness, possible constipation with Tussionex.    All Cardiac Markers in the last 24 hours:   Lab Results   Component Value Date/Time    TROPT <0.015 08/31/2016 02:45 AM    TROPT 0.00 08/30/2016 11:53 PM    TROPT <0.015 08/30/2016 08:45 PM       They underwent exercise stress echocardiography.  Results of testing were negative for ischemia interpreted by Dr. Constance Haw.  The test was an adequate study with target heart rate achieved.    Echo Results  (Last 48 hours)               08/31/16 1318  ECHO STRESS Final result    Narrative:  ==================== XCELERA REPORT ========================================                                                                         Study ID: [id number]                                                     [facility]                                                     [address] [place], [place] [address]  Stress Echocardiogram Report       Name: Ashley [name] Date:       08/31/2016 01:18 PM   MRN: [id number]                 Patient Location: ER^ER05^ER05   DOB: 27-Oct-1985             Age: 5 yrs   Height: 60 in               Weight: 189 lb                        BSA: 1.8 m2   BP: 118/65 mmHg             HR: 67   Gender: Female              Account #: [id number]   Reason For Study: Chest Pain   History: gastric bypass,former smoker   Ordering Physician: Mikal Plane   Performed By: Nancie Neas., RDCS       Interpretation Summary   The Electrocardiographic Interpretation: negative by ECG criteria.   There is normal wall motion at rest. There is appropriate augmentation of   myocardial contractility following exercise.   The OverAll Impression : negative for ischemia .       _____________________________________________________________________________   _       Interpretation Summary       Stress Results              Protocol:  Bruce              Target HR: 160 bpm        Maximum Predicted HR: 188 bpm                           Stress Duration:  7:55 mm:ss *                      Maximum Stress HR:  176 bpm *           Stress Comments   A treadmill exercise test according to Bruce protocol was performed. The   baseline ECG displays normal sinus rhythm. There no ST segment changes during   stress. No arrhythmias were noted during stress. Test was terminated due to   target heart rate was achieved. Patient developed no symptoms. Normal blood   pressure response.       I      WMSI = 1.00     % Normal = 100                                                                    rest                                                                       II      WMSI = 1.00     %  Normal = 100                                                                   peak                                                                                                                               Segments     Size   X - Cannot               2 -                       4 -            1-2       small   Interpret    1 - Normal  Hypokinetic  3 - Akinetic Dyskinetic     3-5   moder   ate   5 -                                                               6-14      large   Aneurysmal                                                       15-16  diffu   se               Left Ventricle   The left ventricular chamber size at rest is normal. The left ventricular   chamber size at peak stress is smaller.       MEASUREMENTS/CALCULATIONS:           Electronically signed byDr Burgess Estelleobert D McCray, MD   08/31/2016 02:51 PM                 Physical Exam     Visit Vitals   ??? BP 111/69 (BP 1 Location: Left arm, BP Patient Position: Supine)   ??? Pulse 80   ??? Temp 98.1 ??F (36.7 ??C)   ??? Resp 18   ??? Ht 5' (1.524 m)   ??? Wt 85.7 kg (189 lb)   ??? SpO2 100%   ??? BMI 36.91 kg/m2       Respiratory: Clear and equal breath sounds.  No respiratory distress.  Cardiovascular: Heart regular without  murmer.     GI:  Abdomen soft non-tender without complaints of pain to palpation.    Clinical Impression/Diagnosis       ICD-10-CM ICD-9-CM   1. Acute chest pain R07.9 786.50   2. Acute bronchospasm J98.01 519.11   3. Pleurisy R09.1 511.0       Disposition and Plan   Patient is discharged home in stable condition. They were advised to follow-up with primary care in one week to have a recheck. Copies of all ED testing and stress tests results were provided to the patient to take with them to their appointment.      Rx Albuterol, Skelaxin, Tussionex   Knows to see PCP for follow-up and follow-up of CT results.    It was discussed with the patient that their cardiac evaluation was unremarkable and does not indicate that immediate intervention is necessary. The patient was counseled that the testing is not 100% accurate and may generate false negatives.     Patient advised to return to the ED for any new or worsening symptoms.     The patient was personally evaluated by myself and discussed with Dr. Oneida Alar who agrees with the above assessment and plan.    Julien Girt, PA-C  August 31, 2016    [facility] medical dictation software was used for portions of this report. Unintended voice recognition errors may occur.

## 2016-08-31 NOTE — ED Progress Note (Signed)
[facility]  Emergency Department Observation Unit       Observation Chest Pain Progress Note   Patient without issues overnight per nursing staff.  For ESE today.  Cont CPP.  Has some persistent cough- will try neb/tessalon.    Visit Vitals   ??? BP 121/62   ??? Pulse 71   ??? Temp 97.8 ??F (36.6 ??C)   ??? Resp 18   ??? Ht 5' (1.524 m)   ??? Wt 85.7 kg (189 lb)   ??? SpO2 100%   ??? BMI 36.91 kg/m2       Diagnostic Results:   All Cardiac Markers in the last 24 hours:   Lab Results   Component Value Date/Time    TROPT <0.015 08/31/2016 02:45 AM    TROPT 0.00 08/30/2016 11:53 PM    TROPT <0.015 08/30/2016 08:45 PM         Julien GirtErin E Kellyn Mccary, PA-C  August 31, 2016

## 2016-08-31 NOTE — ED Notes (Signed)
Pt is back from stress echo test.  Juice and snacks provided.  Tele back on.  Awaiting test result.

## 2016-08-31 NOTE — ED Notes (Signed)
Ches tightness improving and chest pain is decreasing. Toiletries placed at the bedside.

## 2016-08-31 NOTE — ED Notes (Signed)
Patient c/o of pleuritic chest pain now increasing to a 9/10 and c/o of chest tightness.

## 2016-08-31 NOTE — ED Notes (Signed)
TRANSFER - IN REPORT:    Verbal report received from Coralie(name) on Ashley [name]  being received from ED OBS(unit) for routine progression of care      Report consisted of patient???s Situation, Background, Assessment and   Recommendations(SBAR).     Information from the following report(s) SBAR, Kardex, [facility], Recent Results and Cardiac Rhythm S. Rhythm was reviewed with the receiving nurse.    Opportunity for questions and clarification was provided.      Assessment completed upon patient???s care assumed.

## 2016-08-31 NOTE — ED Notes (Signed)
Verbal shift change report given to Regina (oncoming nurse) by Jeana Kersting (offgoing nurse). Report included the following information SBAR, Kardex, ED Summary, MAR, Recent Results, Cardiac Rhythm SR and Alarm Parameters .

## 2016-08-31 NOTE — ED Notes (Signed)
Pt resting with eyes closed.  No distress noted at this time

## 2017-03-19 ENCOUNTER — Inpatient Hospital Stay
Admit: 2017-03-19 | Discharge: 2017-03-19 | Disposition: A | Discharge status: Home / Self Care | Payer: MEDICAID | Attending: Emergency Medicine

## 2017-03-19 ENCOUNTER — Emergency Department: Admit: 2017-03-19 | Payer: MEDICAID | Primary: Family Medicine

## 2017-03-19 ENCOUNTER — Emergency Department

## 2017-03-19 DIAGNOSIS — J209 Acute bronchitis, unspecified: Secondary | ICD-10-CM

## 2017-03-19 LAB — CBC WITH AUTOMATED DIFF
BASOPHILS: 0.3 % (ref 0–3)
EOSINOPHILS: 0.7 % (ref 0–5)
HCT: 30.5 % — ABNORMAL LOW (ref 37.0–50.0)
HGB: 9.5 gm/dl — ABNORMAL LOW (ref 13.0–17.2)
IMMATURE GRANULOCYTES: 0.2 % (ref 0.0–3.0)
LYMPHOCYTES: 33.6 % (ref 28–48)
MCH: 27.9 pg (ref 25.4–34.6)
MCHC: 31.1 gm/dl (ref 30.0–36.0)
MCV: 89.4 fL (ref 80.0–98.0)
MONOCYTES: 6.7 % (ref 1–13)
MPV: 11.1 fL — ABNORMAL HIGH (ref 6.0–10.0)
NEUTROPHILS: 58.5 % (ref 34–64)
NRBC: 0 (ref 0–0)
PLATELET: 315 10*3/uL (ref 140–450)
RBC: 3.41 M/uL — ABNORMAL LOW (ref 3.60–5.20)
RDW-SD: 60.3 — ABNORMAL HIGH (ref 36.4–46.3)
WBC: 5.8 10*3/uL (ref 4.0–11.0)

## 2017-03-19 LAB — POC URINE MACROSCOPIC
Bilirubin: NEGATIVE
Blood: NEGATIVE
Glucose: NEGATIVE mg/dl
Ketone: NEGATIVE mg/dl
Leukocyte Esterase: NEGATIVE
Nitrites: NEGATIVE
Protein: NEGATIVE mg/dl
Specific gravity: 1.01 (ref 1.005–1.030)
Urobilinogen: 0.2 EU/dl (ref 0.0–1.0)
pH (UA): 7 (ref 5–9)

## 2017-03-19 LAB — METABOLIC PANEL, BASIC
Anion gap: 9 mmol/L (ref 5–15)
BUN: 8 mg/dl (ref 7–25)
CO2: 21 mEq/L (ref 21–32)
Calcium: 8.8 mg/dl (ref 8.5–10.1)
Chloride: 112 mEq/L — ABNORMAL HIGH (ref 98–107)
Creatinine: 0.6 mg/dl (ref 0.6–1.3)
GFR est AA: >60
GFR est non-AA: >60
Glucose: 77 mg/dl (ref 74–106)
Potassium: 3.8 mEq/L (ref 3.5–5.1)
Sodium: 141 mEq/L (ref 136–145)

## 2017-03-19 LAB — POC HCG,URINE: HCG urine, QL: NEGATIVE

## 2017-03-19 LAB — POC TROPONIN: Troponin-I: 0 ng/ml (ref 0.00–0.07)

## 2017-03-19 MED ORDER — SODIUM CHLORIDE 0.9% BOLUS IV
0.9 % | INTRAVENOUS | Status: AC
Start: 2017-03-19 — End: 2017-03-19
  Administered 2017-03-19: 16:00:00 via INTRAVENOUS

## 2017-03-19 MED ORDER — SODIUM CHLORIDE 0.9 % IJ SYRG
Freq: Once | INTRAMUSCULAR | Status: AC
Start: 2017-03-19 — End: 2017-03-19
  Administered 2017-03-19: 16:00:00 via INTRAVENOUS

## 2017-03-19 MED ORDER — PREDNISONE 20 MG TAB
20 mg | ORAL_TABLET | Freq: Every day | ORAL | 0 refills | Status: AC
Start: 2017-03-19 — End: 2017-03-24

## 2017-03-19 MED ORDER — ACETAMINOPHEN 325 MG TABLET
325 mg | ORAL | Status: AC
Start: 2017-03-19 — End: 2017-03-19
  Administered 2017-03-19: 16:00:00 via ORAL

## 2017-03-19 MED ORDER — ALBUTEROL SULFATE 90 MCG/ACTUATION BREATH ACTIVATED POWDER INHALER
90 mcg/actuation | Freq: Four times a day (QID) | RESPIRATORY_TRACT | 0 refills | Status: AC | PRN
Start: 2017-03-19 — End: 2017-03-24

## 2017-03-19 MED ORDER — BENZONATATE 100 MG CAP
100 mg | ORAL_CAPSULE | Freq: Three times a day (TID) | ORAL | 0 refills | Status: AC | PRN
Start: 2017-03-19 — End: 2017-03-26

## 2017-03-19 MED FILL — ACETAMINOPHEN 325 MG TABLET: 325 mg | ORAL | Qty: 3

## 2017-03-19 NOTE — ED Notes (Signed)
1:40 PM  03/19/17     Discharge instructions given to patient (name) with verbalization of understanding. Patient accompanied by friend.  Patient discharged with the following prescriptions tessalon, albuterol, prednisone. Patient discharged to home (destination).      Matt HolmesHaley Kreider

## 2017-03-19 NOTE — ED Provider Notes (Signed)
[facility]  Emergency Department Treatment Report    Patient: Ashley [name] Age: 32 y.o. Sex: female    Date of Birth: 01-22-1986 Admit Date: 03/19/2017 PCP: Chipper Herb Da, MD   MRN: [id number]  CSN: [id number]  Attending: Loa Socks, MD   Room: ER02/ER02 Time Dictated: 1:31 PM APP: Clayborne Dana       Chief Complaint   Chief Complaint   Patient presents with   ??? Chest Pain       History of Present Illness   32 y.o. female with a history of bronchitis, costochondritis who presents to the ED today complaining of a dry cough for 3 weeks, chest pain and tightness for 3 days.  States the chest pain is located in the central and left chest. Also reports feeling dizzy but no loss of consciousness.  No headache.  States she has used Robitussin but tends to give her palpitations.  The chest pain is worse with exertion.  Patient denies any long distance travels, discharges, hemoptysis, unilateral leg swelling, hormones.  Of note, patient states she came in last year with the exact same symptoms.  Per record review had a negative echo stress test and negative CTA.      Review of Systems   Review of Systems   Constitutional: Negative for chills, fever and weight loss.   HENT: Negative for congestion, ear discharge, ear pain, nosebleeds and sore throat.    Eyes: Negative for blurred vision and redness.   Respiratory: Positive for cough. Negative for shortness of breath and wheezing.    Cardiovascular: Positive for chest pain. Negative for orthopnea and leg swelling.        Chest Pain due to cough for 3 weeks.    Gastrointestinal: Negative for abdominal pain, constipation, diarrhea, nausea and vomiting.   Genitourinary: Negative for dysuria, frequency, hematuria and urgency.   Musculoskeletal: Negative for back pain, joint pain and neck pain.   Skin: Negative for rash.   Neurological: Negative for loss of consciousness.   Psychiatric/Behavioral: Negative for substance abuse.        Past Medical/Surgical History     Past Medical History:   Diagnosis Date   ??? Ill-defined condition     bronchitis   ??? Pneumonia      Past Surgical History:   Procedure Laterality Date   ??? HX CESAREAN SECTION  2002, 2004, 2008    x 3   ??? HX CESAREAN SECTION     ??? HX GASTRIC BYPASS  10-2009    [facility]   ??? HX GASTRIC BYPASS     ??? HX GASTRIC BYPASS         Social History     Social History     Socioeconomic History   ??? Marital status: SINGLE     Spouse name: Not on file   ??? Number of children: Not on file   ??? Years of education: Not on file   ??? Highest education level: Not on file   Tobacco Use   ??? Smoking status: Former Smoker     Types: Cigarettes     Last attempt to quit: 11/12/2009     Years since quitting: 7.3   ??? Smokeless tobacco: Never Used   Substance and Sexual Activity   ??? Alcohol use: Yes     Alcohol/week: 0.0 - 2.4 oz     Comment: occassional   ??? Drug use: No   ??? Sexual activity: Yes     Partners:  Male     Birth control/protection: None   Social History Narrative    ** Merged History Encounter **            Family History     Family History   Problem Relation Age of Onset   ??? Diabetes Mother    ??? Hypertension Father    ??? Heart Disease Maternal Grandmother    ??? Heart Attack Maternal Grandmother    ??? High Cholesterol Maternal Grandmother    ??? Arthritis-osteo Maternal Grandmother    ??? Stroke Paternal Aunt    ??? Kidney Disease Maternal Aunt        Current Medications     Prior to Admission Medications   Prescriptions Last Dose Informant Patient Reported? Taking?   albuterol (PROVENTIL HFA, VENTOLIN HFA, PROAIR HFA) 90 mcg/actuation inhaler Not Taking at Unknown time  No No   Sig: Use 1-2 puffs q 6 hours prn cough/wheeze.   chlorpheniramine-HYDROcodone (TUSSIONEX PENNKINETIC ER) 10-8 mg/5 mL suspension Not Taking at Unknown time  No No   Sig: Take 5 mL by mouth every twelve (12) hours as needed for Cough. Max Daily Amount: 10 mL.   clonazePAM (KLONOPIN) 1 mg tablet Not Taking at Unknown time  Yes No    Sig: Take 1 mg by mouth daily.   cyanocobalamin (B-12 DOTS) 500 mcg tablet   Yes No   Sig: Take 500 mcg by mouth daily.        Facility-Administered Medications: None       Allergies     Allergies   Allergen Reactions   ??? Aspirin Other (comments)     Stomach ulcer   ??? Ibuprofen Unknown (comments)     Gastric Bypass--advised to avoid   ??? Nsaids (Non-Steroidal Anti-Inflammatory Drug) Other (comments)     Hx gastric bypass       Physical Exam     ED Triage Vitals [03/19/17 1047]   Enc Vitals Group      BP 125/85      Pulse (Heart Rate) 63      Resp Rate 18      Temp 98.4 ??F (36.9 ??C)      Temp src       O2 Sat (%) 100 %      Weight 215 lb      Height 5'      Head Circumference       Peak Flow       Pain Score       Pain Loc       Pain Edu?       Excl. in GC?      Physical Exam   Constitutional: She is oriented to person, place, and time and well-developed, well-nourished, and in no distress. No distress.   HENT:   Head: Normocephalic and atraumatic.   Eyes: Conjunctivae are normal.   Neck: Normal range of motion. Neck supple.   Cardiovascular: Normal rate, regular rhythm and normal heart sounds. Exam reveals no gallop.   No murmur heard.  Pulmonary/Chest: Effort normal and breath sounds normal. No respiratory distress. She has no wheezes. She has no rales. She exhibits tenderness.   Chest wall is tender to palpation.   Abdominal: Soft. Bowel sounds are normal. She exhibits no distension. There is no tenderness. There is no rebound and no guarding.   Musculoskeletal: Normal range of motion. She exhibits no edema or tenderness.   Neurological: She is alert and oriented to person, place, and  time.   Skin: Skin is warm and dry. She is not diaphoretic.   Psychiatric: Affect and judgment normal.   Nursing note and vitals reviewed.      Impression and Management Plan   Patient is a 32 year old female with a history of acute bronchitis, presenting with cough for 3 weeks and chest pain and tightness that  started 3 days ago. Of note, patient states she came in last year with the exact same symptoms.  Per record review had a negative echo stress test and negative CTA.   Consider: Bronchitis, pleurisy, pneumonia, pneumothorax, PE, ACS, costochondritis.  Do not suspect PE or ACS.  She is PERC negative.    Diagnostic Studies     Results for orders placed or performed during the hospital encounter of 03/19/17   XR CHEST PA LAT    Narrative    Indication: Chest pain, smoking  PA and lateral chest      The heart is normal in size. Lungs are clear except for small opacity in the  posterior base on lateral view not seen on frontal projection. Staples overlie  the upper abdomen. And the bony thorax is intact. No effusions or  other  abnormalities are seen.      Impression    Impression: Question posterior basilar density on lateral view. Follow-up  scheduled non-emergency CT of the chest recommended based on report from prior  study 08/30/2016.     CBC WITH AUTOMATED DIFF   Result Value Ref Range    WBC 5.8 4.0 - 11.0 1000/mm3    RBC 3.41 (L) 3.60 - 5.20 M/uL    HGB 9.5 (L) 13.0 - 17.2 gm/dl    HCT 29.530.5 (L) 62.137.0 - 50.0 %    MCV 89.4 80.0 - 98.0 fL    MCH 27.9 25.4 - 34.6 pg    MCHC 31.1 30.0 - 36.0 gm/dl    PLATELET 308315 657140 - 846450 1000/mm3    MPV 11.1 (H) 6.0 - 10.0 fL    RDW-SD 60.3 (H) 36.4 - 46.3      NRBC 0 0 - 0      IMMATURE GRANULOCYTES 0.2 0.0 - 3.0 %    NEUTROPHILS 58.5 34 - 64 %    LYMPHOCYTES 33.6 28 - 48 %    MONOCYTES 6.7 1 - 13 %    EOSINOPHILS 0.7 0 - 5 %    BASOPHILS 0.3 0 - 3 %   METABOLIC PANEL, BASIC   Result Value Ref Range    Sodium 141 136 - 145 mEq/L    Potassium 3.8 3.5 - 5.1 mEq/L    Chloride 112 (H) 98 - 107 mEq/L    CO2 21 21 - 32 mEq/L    Glucose 77 74 - 106 mg/dl    BUN 8 7 - 25 mg/dl    Creatinine 0.6 0.6 - 1.3 mg/dl    GFR est AA >96.2>60.0      GFR est non-AA >60      Calcium 8.8 8.5 - 10.1 mg/dl    Anion gap 9 5 - 15 mmol/L   POC URINE MACROSCOPIC   Result Value Ref Range     Glucose Negative NEGATIVE,Negative mg/dl    Bilirubin Negative NEGATIVE,Negative      Ketone Negative NEGATIVE,Negative mg/dl    Specific gravity 9.5281.010 1.005 - 1.030      Blood Negative NEGATIVE,Negative      pH (UA) 7.0 5 - 9      Protein Negative  NEGATIVE,Negative mg/dl    Urobilinogen 0.2 0.0 - 1.0 EU/dl    Nitrites Negative NEGATIVE,Negative      Leukocyte Esterase Negative NEGATIVE,Negative      Color Yellow      Appearance Clear     POC HCG,URINE   Result Value Ref Range    HCG urine, QL negative NEGATIVE,Negative,negative     POC TROPONIN-I   Result Value Ref Range    Troponin-I 0.00 0.00 - 0.07 ng/ml         ED Course          ER Medications Given:  Medications   sodium chloride 0.9 % bolus infusion 1,000 mL (0 mL IntraVENous IV Completed 03/19/17 1340)   acetaminophen (TYLENOL) tablet 975 mg (975 mg Oral Given 03/19/17 1129)   sodium chloride (NS) flush 5-10 mL (10 mL IntraVENous Given 03/19/17 1129)       Medical Decision Making   Troponin was negative, Urine pregnancy is negative.  Chest x-ray showed with possible basilar density and recommend nonemergent CT chest.  Patient was informed of findings and she will follow-up with her primary care provider.  Patient was given for prednisone, albuterol, Tessalon Perles at discharge.        Final Diagnosis        ICD-10-CM ICD-9-CM   1. Acute bronchitis, unspecified organism J20.9 466.0       Disposition     Disposition and plan  Patient was discharged home in stable condition with discharge instructions on the same.     Return to the ER if condition worsens or new symptoms develop.   Follow up with primary care as discussed.     The patient was personally evaluated by myself and Loa Socks, MD who agrees with the above assessment and plan.        Dragon medical dictation software was used for portions of this report. Unintended errors may occur.       Lynelle Doctor Virgle Arth, PA-C  March 19, 2017       My signature above authenticates this document and my orders, the final ??  diagnosis (es), discharge prescription (s), and instructions in the Epic ??  record.  If you have any questions please contact [phone].  ??  Nursing notes have been reviewed by the physician/ advanced practice Clinician.

## 2017-03-20 LAB — EKG 12-LEAD
Atrial Rate: 67 {beats}/min
Diagnosis: NORMAL
P Axis: 51 degrees
P-R Interval: 152 ms
Q-T Interval: 400 ms
QRS Duration: 80 ms
QTc Calculation (Bazett): 422 ms
R Axis: 36 degrees
T Axis: 2 degrees
Ventricular Rate: 67 {beats}/min

## 2017-03-20 LAB — EKG, 12 LEAD, INITIAL
Atrial Rate: 67 {beats}/min
Calculated P Axis: 51 degrees
Calculated R Axis: 36 degrees
Calculated T Axis: 2 degrees
Diagnosis: NORMAL
P-R Interval: 152 ms
Q-T Interval: 400 ms
QRS Duration: 80 ms
QTC Calculation (Bezet): 422 ms
Ventricular Rate: 67 {beats}/min

## 2018-01-20 ENCOUNTER — Emergency Department: Admit: 2018-01-20 | Payer: MEDICAID | Primary: Family Medicine

## 2018-01-20 ENCOUNTER — Inpatient Hospital Stay
Admit: 2018-01-20 | Discharge: 2018-01-21 | Disposition: A | Discharge status: Home / Self Care | Payer: MEDICAID | Attending: Emergency Medicine

## 2018-01-20 DIAGNOSIS — S86911A Strain of unspecified muscle(s) and tendon(s) at lower leg level, right leg, initial encounter: Secondary | ICD-10-CM

## 2018-01-20 LAB — POC HCG,URINE
HCG urine, QL: NEGATIVE
Pregnancy Test(Urn): NEGATIVE

## 2018-01-20 MED ORDER — ACETAMINOPHEN 325 MG TABLET
325 mg | ORAL | Status: AC
Start: 2018-01-20 — End: 2018-01-20
  Administered 2018-01-21: via ORAL

## 2018-01-20 NOTE — Progress Notes (Signed)
Patient resting comfortably with eyes closed. Arouses to voice.

## 2018-01-20 NOTE — Progress Notes (Signed)
PVL completed

## 2018-01-20 NOTE — ED Notes (Signed)
9:32 PM  01/20/18     Discharge instructions given to patient (name) with verbalization of understanding. Patient accompanied by friend.  Patient discharged with the following prescriptions see AVS. Patient discharged to home (destination).      Wyvonne Lenz

## 2018-01-20 NOTE — Progress Notes (Signed)
Peripheral Vascular Lab Preliminary : Right Lower Extremity Venous Duplex    1. No evidence of superficial or deep vein thrombosis noted in the right lower extremity and the left common femoral vein.     Final report to follow   Francee Piccolo, RVS

## 2018-01-20 NOTE — ED Provider Notes (Signed)
[facility]  Emergency Department Treatment Report    Patient: Ashley [name] Age: 32 y.o. Sex: female    Date of Birth: 1986/01/23 Admit Date: 01/20/2018 PCP: Chipper Herb Da, MD   MRN: [id number]  CSN: [id number]  Attending:  Konrad Felix, MD   Room: 111/EO11 Time Dictated: 6:19 PM APP:  Thea Silversmith       Chief Complaint   Chief Complaint   Patient presents with   ??? Knee Pain   ??? Hip Pain     History of Present Illness   32 y.o. female presents with 10 out of 10 right knee pain that radiates up the right thigh and the back of the right knee to the hip.  Denies falls or injuries.  [place] Spaniel it started gradually yesterday and she was able to ambulate without difficulty.  Says now the pain is excruciating.  She feels sharp pain shoot across her groin.  Denies fever chills, chest pain or trouble breathing.  Has taken ibuprofen without relief of symptoms.  Says she cannot feel her knee.  Denies back pain, bowel or bladder dysfunction.  Last menstrual period was in September and she suspects that she could be pregnant.    Review of Systems   Constitutional:  No fever or chills  Eyes: No eye redness or drainage.  ENT: No sore throat, runny nose.  Respiratory: No cough, dyspnea or wheezing.  Cardiovascular: No chest pain, pressure, palpitations, tightness or heaviness.  Gastrointestinal: No vomiting, diarrhea or abdominal pain  Genitourinary: No dysuria, frequency, or urgency.  Musculoskeletal: See HPI  Integumentary: No rashes   Neurological: No headaches, focal motor symptoms.  Denies complaints in all other systems    Past Medical/Surgical History     Past Medical History:   Diagnosis Date   ??? Ill-defined condition     bronchitis   ??? Pneumonia      Past Surgical History:   Procedure Laterality Date   ??? HX CESAREAN SECTION  2002, 2004, 2008    x 3   ??? HX CESAREAN SECTION     ??? 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 HX GASTRIC BYPASS     ??? HX GASTRIC BYPASS         Social History     Social History      Socioeconomic History   ??? Marital status: SINGLE     Spouse name: Not on file   ??? Number of children: Not on file   ??? Years of education: Not on file   ??? Highest education level: Not on file   Tobacco Use   ??? Smoking status: Former Smoker     Types: Cigarettes     Last attempt to quit: 11/12/2009     Years since quitting: 8.1   ??? Smokeless tobacco: Never Used   Substance and Sexual Activity   ??? Alcohol use: Yes     Alcohol/week: 0.0 - 4.0 standard drinks     Comment: occassional   ??? Drug use: No   ??? Sexual activity: Yes     Partners: Male     Birth control/protection: None   Social History Narrative    ** Merged History Encounter **            Family History     Family History   Problem Relation Age of Onset   ??? Diabetes Mother    ??? Hypertension Father    ??? Heart Disease  Maternal Grandmother    ??? Heart Attack Maternal Grandmother    ??? High Cholesterol Maternal Grandmother    ??? Arthritis-osteo Maternal Grandmother    ??? Stroke Paternal Aunt    ??? Kidney Disease Maternal Aunt        Home Medications     Prior to Admission Medications   Prescriptions Last Dose Informant Patient Reported? Taking?   cyanocobalamin (B-12 DOTS) 500 mcg tablet   Yes No   Sig: Take 500 mcg by mouth daily.        Facility-Administered Medications: None       Allergies     Allergies   Allergen Reactions   ??? Aspirin Other (comments)     Stomach ulcer   ??? Ibuprofen Unknown (comments)     Gastric Bypass--advised to avoid   ??? Nsaids (Non-Steroidal Anti-Inflammatory Drug) Other (comments)     Hx gastric bypass       Physical Exam     ED Triage Vitals   ED Encounter Vitals Group      BP 01/20/18 1752 132/84      Pulse (Heart Rate) 01/20/18 1752 (!) 111      Resp Rate 01/20/18 1752 16      Temp 01/20/18 1752 98.3 ??F (36.8 ??C)      Temp src --       O2 Sat (%) 01/20/18 1752 98 %      Weight 01/20/18 1751 200 lb      Height 01/20/18 1751 5'     Constitutional: Patient appears well developed and well nourished. Appearance and behavior are age and  situation appropriate.  HEENT: Conjunctiva clear.  PERRLA. Mucous membranes moist, non-erythematous. Surface of the pharynx, palate, and tongue are pink, moist and without lesions.  Neck: Symmetrical, no masses. Normal range of motion.  Respiratory: Lungs clear to auscultation, nonlabored respirations. No tachypnea or accessory muscle use.  Cardiovascular: Heart regular rate and rhythm without murmur rubs or gallops. Calves soft and left calf is nontender to touch but right calf is tender to touch.  No peripheral edema or significant variscosities.  DP pulses are 2+ bilaterally.  Gastrointestinal:  Abdomen soft, nontender without complaint of pain to palpation.  Musculoskeletal: No deformities of the limbs.  Normal range of motion of both shoulders, elbows, hips and left knee.  Patient can bend and extend the right knee that is painful.  There is no effusion over the right knee, no warmth and only tenderness to the patient in the posterior right popliteal fossa and down the right calf.   Integumentary: Warm and dry without rashes or lesions  Neurologic: Alert and moving all limbs spontaneously.  No foot drop bilaterally.  Normal sensation to both feet.    Impression and Management Plan   32 year old female with right knee pain that she says is primarily in the back of the knee and down the calf.  Patient says she has numbness to the right knee though she has normal sensation on my exam.  No back pain.  Do not suspect cauda equina syndrome.  Will x-ray the right knee and get PVL the right lower extremity.  Patient denies chest pain or trouble breathing.  Will check urine pregnancy test.  Patient is not on exogenous hormones and does not have a history of blood clots.  Diagnostic Studies       Lab:   Recent Results (from the past 24 hour(s))   POC HCG,URINE    Collection Time: 01/20/18  6:39 PM   Result Value Ref Range    HCG urine, QL negative NEGATIVE,Negative,negative         My preliminary x-ray interpretation:  Right knee x-ray shows no acute bony abnormality  Imaging:    Xr Knee Rt Min 4 V    Result Date: 01/20/2018  Indication: 80 without injury 4 views right knee show no bony or soft tissue abnormality.     IMPRESSION: Normal study.     ED Course          Patient received the following medications during stay:  Medications   acetaminophen (TYLENOL) tablet 975 mg (975 mg Oral Given 01/20/18 1905)   morphine injection 6 mg (6 mg IntraMUSCular Given 01/20/18 1926)         Most recent vital signs:  Visit Vitals  BP 132/84 (BP 1 Location: Left arm, BP Patient Position: Sitting)   Pulse (!) 111   Temp 98.3 ??F (36.8 ??C)   Resp 16   Ht 5' (1.524 m)   Wt 90.7 kg (200 lb)   SpO2 98%   BMI 39.06 kg/m??       Patient's urine pregnancy test is negative.  Nurse can tells me patient ambulated to and from the bathroom but she was having difficulty as she would not bend her right knee.  He denies that patient had a noticeable foot drop.    1. No evidence of superficial or deep vein thrombosis noted in the right lower extremity and the left common femoral vein.   ??  Final report to follow   Francee Piccolo, RVS    Continuation by Haze Justin, MD:  I, Dr. Haze Justin, have personally seen and examined this patient; I have fully participated in the care of this patient with the advanced practice provider.  I have reviewed and agree with all pertinent clinical information including history, physical exam, labs, radiographic studies and the plan.  I have also reviewed and agree with the medications, allergies and past medical history sections for this patient.  Patient presents with complaints of right leg pain posterior calf tenderness and knee discomfort, x-ray negative acute abnormality PVL negative for DVT, patient placed in a knee immobilizer with crutches no palpable abnormalities on exam, distal neurovascular intact.    Konrad Felix, MD      Medical Decision Making     Patient will be discharged with the following medication  prescriptions:  Current Discharge Medication List      START taking these medications    Details   lidocaine (LIDODERM) 5 % Apply patch to the affected area for 12 hours a day and remove for 12 hours a day.  Qty: 30 Patch, Refills: 0      acetaminophen (TYLENOL) 325 mg tablet Take 2 Tabs by mouth every six (6) hours as needed for Pain.  Qty: 20 Tab, Refills: 0      methocarbamol (ROBAXIN-750) 750 mg tablet Take 1 Tab by mouth four (4) times daily.  Qty: 20 Tab, Refills: 0             X-ray of the right knee shows no acute abnormality.  Do not suspect septic joint.  Do not suspect cauda equina syndrome or sciatica given the lack of back pain and pain she says radiates from the knee upward toward the hip.  Patient moving the right hip without difficulty.  PVL pending.  Care handed over to Dr. Henrene Hawking.    I have discussed with patient  that if PVL is negative that we would place her in a knee immobilizer and have her use crutches.  We will have her follow-up with orthopedics.  Have given patient x-ray results.    Final Diagnosis     1. Acute pain of right knee    2. Strain of calf muscle, right, initial encounter        Disposition   Pending ED course and results    The patient was personally evaluated by myself and discussed with Holiday Mcmenamin, Janett Billow, MD who agrees with the above assessment and plan.      Elray Buba, PA-C  January 20, 2018    My signature above authenticates this document and my orders, the final ??  diagnosis (es), discharge prescription (s), and instructions in the Epic ??  record.  If you have any questions please contact [phone].  ??  Nursing notes have been reviewed by the physician/ advanced practice ??  Clinician.

## 2018-01-20 NOTE — ED Notes (Signed)
Pt states yesterday she started having some mild right knee pain, today pain has been increasing and now goes from knee up into right thigh and hip, difficulty walking and pain with any movement of leg, no known injuries

## 2018-01-20 NOTE — Progress Notes (Signed)
PVL at bedside

## 2018-01-20 NOTE — ED Provider Notes (Addendum)
[facility]  Emergency Department Treatment Report    Patient: KECIA SWOBODA Age: 32 y.o. Sex: female    Date of Birth: Jan 13, 1986 Admit Date: 01/20/2018 PCP: Chipper Herb Da, MD   MRN: [id number]  CSN: [id number]  Attending:  Konrad Felix, MD   Room: 111/EO11 Time Dictated: 6:19 PM APP:  Thea Silversmith       Chief Complaint   Chief Complaint   Patient presents with   ??? Knee Pain   ??? Hip Pain     History of Present Illness   32 y.o. female presents with 10 out of 10 right knee pain that radiates up the right thigh and the back of the right knee to the hip.  Denies falls or injuries.  [place] Spaniel it started gradually yesterday and she was able to ambulate without difficulty.  Says now the pain is excruciating.  She feels sharp pain shoot across her groin.  Denies fever chills, chest pain or trouble breathing.  Has taken ibuprofen without relief of symptoms.  Says she cannot feel her knee.  Denies back pain, bowel or bladder dysfunction.  Last menstrual period was in September and she suspects that she could be pregnant.    Review of Systems   Constitutional:  No fever or chills  Eyes: No eye redness or drainage.  ENT: No sore throat, runny nose.  Respiratory: No cough, dyspnea or wheezing.  Cardiovascular: No chest pain, pressure, palpitations, tightness or heaviness.  Gastrointestinal: No vomiting, diarrhea or abdominal pain  Genitourinary: No dysuria, frequency, or urgency.  Musculoskeletal: See HPI  Integumentary: No rashes   Neurological: No headaches, focal motor symptoms.  Denies complaints in all other systems    Past Medical/Surgical History     Past Medical History:   Diagnosis Date   ??? Ill-defined condition     bronchitis   ??? Pneumonia      Past Surgical History:   Procedure Laterality Date   ??? HX CESAREAN SECTION  2002, 2004, 2008    x 3   ??? HX CESAREAN SECTION     ??? 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 HX GASTRIC BYPASS     ??? HX GASTRIC BYPASS         Social History     Social History      Socioeconomic History   ??? Marital status: SINGLE     Spouse name: Not on file   ??? Number of children: Not on file   ??? Years of education: Not on file   ??? Highest education level: Not on file   Tobacco Use   ??? Smoking status: Former Smoker     Types: Cigarettes     Last attempt to quit: 11/12/2009     Years since quitting: 8.1   ??? Smokeless tobacco: Never Used   Substance and Sexual Activity   ??? Alcohol use: Yes     Alcohol/week: 0.0 - 4.0 standard drinks     Comment: occassional   ??? Drug use: No   ??? Sexual activity: Yes     Partners: Male     Birth control/protection: None   Social History Narrative    ** Merged History Encounter **            Family History     Family History   Problem Relation Age of Onset   ??? Diabetes Mother    ??? Hypertension Father    ??? Heart Disease  Maternal Grandmother    ??? Heart Attack Maternal Grandmother    ??? High Cholesterol Maternal Grandmother    ??? Arthritis-osteo Maternal Grandmother    ??? Stroke Paternal Aunt    ??? Kidney Disease Maternal Aunt        Home Medications     Prior to Admission Medications   Prescriptions Last Dose Informant Patient Reported? Taking?   cyanocobalamin (B-12 DOTS) 500 mcg tablet   Yes No   Sig: Take 500 mcg by mouth daily.        Facility-Administered Medications: None       Allergies     Allergies   Allergen Reactions   ??? Aspirin Other (comments)     Stomach ulcer   ??? Ibuprofen Unknown (comments)     Gastric Bypass--advised to avoid   ??? Nsaids (Non-Steroidal Anti-Inflammatory Drug) Other (comments)     Hx gastric bypass       Physical Exam     ED Triage Vitals   ED Encounter Vitals Group      BP 01/20/18 1752 132/84      Pulse (Heart Rate) 01/20/18 1752 (!) 111      Resp Rate 01/20/18 1752 16      Temp 01/20/18 1752 98.3 ??F (36.8 ??C)      Temp src --       O2 Sat (%) 01/20/18 1752 98 %      Weight 01/20/18 1751 200 lb      Height 01/20/18 1751 5'     Constitutional: Patient appears well developed and well nourished.  Appearance and behavior are age and situation appropriate.  HEENT: Conjunctiva clear.  PERRLA. Mucous membranes moist, non-erythematous. Surface of the pharynx, palate, and tongue are pink, moist and without lesions.  Neck: Symmetrical, no masses. Normal range of motion.  Respiratory: Lungs clear to auscultation, nonlabored respirations. No tachypnea or accessory muscle use.  Cardiovascular: Heart regular rate and rhythm without murmur rubs or gallops. Calves soft and left calf is nontender to touch but right calf is tender to touch.  No peripheral edema or significant variscosities.  DP pulses are 2+ bilaterally.  Gastrointestinal:  Abdomen soft, nontender without complaint of pain to palpation.  Musculoskeletal: No deformities of the limbs.  Normal range of motion of both shoulders, elbows, hips and left knee.  Patient can bend and extend the right knee that is painful.  There is no effusion over the right knee, no warmth and only tenderness to the patient in the posterior right popliteal fossa and down the right calf.   Integumentary: Warm and dry without rashes or lesions  Neurologic: Alert and moving all limbs spontaneously.  No foot drop bilaterally.  Normal sensation to both feet.    Impression and Management Plan   32 year old female with right knee pain that she says is primarily in the back of the knee and down the calf.  Patient says she has numbness to the right knee though she has normal sensation on my exam.  No back pain.  Do not suspect cauda equina syndrome.  Will x-ray the right knee and get PVL the right lower extremity.  Patient denies chest pain or trouble breathing.  Will check urine pregnancy test.  Patient is not on exogenous hormones and does not have a history of blood clots.  Diagnostic Studies       Lab:   Recent Results (from the past 24 hour(s))   POC HCG,URINE    Collection Time: 01/20/18  6:39 PM   Result Value Ref Range    HCG urine, QL negative NEGATIVE,Negative,negative          My preliminary x-ray interpretation: Right knee x-ray shows no acute bony abnormality  Imaging:    Xr Knee Rt Min 4 V    Result Date: 01/20/2018  Indication: 80 without injury 4 views right knee show no bony or soft tissue abnormality.     IMPRESSION: Normal study.     ED Course          Patient received the following medications during stay:  Medications   acetaminophen (TYLENOL) tablet 975 mg (975 mg Oral Given 01/20/18 1905)   morphine injection 6 mg (6 mg IntraMUSCular Given 01/20/18 1926)         Most recent vital signs:  Visit Vitals  BP 132/84 (BP 1 Location: Left arm, BP Patient Position: Sitting)   Pulse (!) 111   Temp 98.3 ??F (36.8 ??C)   Resp 16   Ht 5' (1.524 m)   Wt 90.7 kg (200 lb)   SpO2 98%   BMI 39.06 kg/m??       Patient's urine pregnancy test is negative.  Nurse can tells me patient ambulated to and from the bathroom but she was having difficulty as she would not bend her right knee.  He denies that patient had a noticeable foot drop.    1. No evidence of superficial or deep vein thrombosis noted in the right lower extremity and the left common femoral vein.   ??  Final report to follow   Francee Piccolo, RVS    Continuation by Haze Justin, MD:  I, Dr. Haze Justin, have personally seen and examined this patient; I have fully participated in the care of this patient with the advanced practice provider.  I have reviewed and agree with all pertinent clinical information including history, physical exam, labs, radiographic studies and the plan.  I have also reviewed and agree with the medications, allergies and past medical history sections for this patient.  Patient presents with complaints of right leg pain posterior calf tenderness and knee discomfort, x-ray negative acute abnormality PVL negative for DVT, patient placed in a knee immobilizer with crutches no palpable abnormalities on exam, distal neurovascular intact.    Konrad Felix, MD      Medical Decision Making      Patient will be discharged with the following medication prescriptions:  Current Discharge Medication List      START taking these medications    Details   lidocaine (LIDODERM) 5 % Apply patch to the affected area for 12 hours a day and remove for 12 hours a day.  Qty: 30 Patch, Refills: 0      acetaminophen (TYLENOL) 325 mg tablet Take 2 Tabs by mouth every six (6) hours as needed for Pain.  Qty: 20 Tab, Refills: 0      methocarbamol (ROBAXIN-750) 750 mg tablet Take 1 Tab by mouth four (4) times daily.  Qty: 20 Tab, Refills: 0             X-ray of the right knee shows no acute abnormality.  Do not suspect septic joint.  Do not suspect cauda equina syndrome or sciatica given the lack of back pain and pain she says radiates from the knee upward toward the hip.  Patient moving the right hip without difficulty.  PVL pending.  Care handed over to Dr. Henrene Hawking.    I have discussed with patient  that if PVL is negative that we would place her in a knee immobilizer and have her use crutches.  We will have her follow-up with orthopedics.  Have given patient x-ray results.    Final Diagnosis     1. Acute pain of right knee    2. Strain of calf muscle, right, initial encounter        Disposition   Pending ED course and results    The patient was personally evaluated by myself and discussed with Dakiya Puopolo, Janett Billow, MD who agrees with the above assessment and plan.      Elray Buba, PA-C  January 20, 2018    My signature above authenticates this document and my orders, the final ??  diagnosis (es), discharge prescription (s), and instructions in the Epic ??  record.  If you have any questions please contact [phone].  ??  Nursing notes have been reviewed by the physician/ advanced practice ??  Clinician.

## 2018-01-20 NOTE — Progress Notes (Signed)
Peripheral Vascular Lab Preliminary : Right Lower Extremity Venous Duplex    1. No evidence of superficial or deep vein thrombosis noted in the right lower extremity and the left common femoral vein.     Final report to follow   Shatana Saxton, RVS

## 2018-01-20 NOTE — Progress Notes (Signed)
PVL at bedside

## 2018-01-20 NOTE — ED Triage Notes (Signed)
Pt states yesterday she started having some mild right knee pain, today pain has been increasing and now goes from knee up into right thigh and hip, difficulty walking and pain with any movement of leg, no known injuries

## 2018-01-20 NOTE — ED Notes (Signed)
9:32 PM  01/20/18     Discharge instructions given to patient (name) with verbalization of understanding. Patient accompanied by friend.  Patient discharged with the following prescriptions see AVS. Patient discharged to home (destination).      Evan R Mack

## 2018-01-21 MED ORDER — METHOCARBAMOL 750 MG TAB
750 mg | ORAL_TABLET | Freq: Four times a day (QID) | ORAL | 0 refills | Status: DC
Start: 2018-01-21 — End: 2018-04-04

## 2018-01-21 MED ORDER — MORPHINE 4 MG/ML SYRINGE
4 mg/mL | INTRAMUSCULAR | Status: AC
Start: 2018-01-21 — End: 2018-01-20
  Administered 2018-01-21: via INTRAMUSCULAR

## 2018-01-21 MED ORDER — ACETAMINOPHEN 325 MG TABLET
325 mg | ORAL_TABLET | Freq: Four times a day (QID) | ORAL | 0 refills | Status: AC | PRN
Start: 2018-01-21 — End: ?

## 2018-01-21 MED ORDER — LIDOCAINE 5 % (700 MG/PATCH) ADHESIVE PATCH
5 % | MEDICATED_PATCH | CUTANEOUS | 0 refills | Status: DC
Start: 2018-01-21 — End: 2018-08-31

## 2018-01-21 MED FILL — TYLENOL 325 MG TABLET: 325 mg | ORAL | Qty: 3

## 2018-01-21 MED FILL — MORPHINE 4 MG/ML SYRINGE: 4 mg/mL | INTRAMUSCULAR | Qty: 2

## 2018-03-11 ENCOUNTER — Inpatient Hospital Stay
Admit: 2018-03-11 | Discharge: 2018-03-11 | Disposition: A | Discharge status: Home / Self Care | Payer: MEDICAID | Attending: Emergency Medicine

## 2018-03-11 DIAGNOSIS — A09 Infectious gastroenteritis and colitis, unspecified: Secondary | ICD-10-CM

## 2018-03-11 LAB — CBC WITH AUTO DIFFERENTIAL
Basophils %: 0.1 % (ref 0–3)
Eosinophils %: 0.5 % (ref 0–5)
Hematocrit: 38.6 % (ref 37.0–50.0)
Hemoglobin: 12.4 gm/dl — ABNORMAL LOW (ref 13.0–17.2)
Immature Granulocytes: 0.1 % (ref 0.0–3.0)
Lymphocytes %: 19.9 % — ABNORMAL LOW (ref 28–48)
MCH: 30.6 pg (ref 25.4–34.6)
MCHC: 32.1 gm/dl (ref 30.0–36.0)
MCV: 95.3 fL (ref 80.0–98.0)
MPV: 11 fL — ABNORMAL HIGH (ref 6.0–10.0)
Monocytes %: 7.6 % (ref 1–13)
Neutrophils %: 71.8 % — ABNORMAL HIGH (ref 34–64)
Nucleated RBCs: 0 (ref 0–0)
Platelets: 257 10*3/uL (ref 140–450)
RBC: 4.05 M/uL (ref 3.60–5.20)
RDW-SD: 58.7 — ABNORMAL HIGH (ref 36.4–46.3)
WBC: 7.9 10*3/uL (ref 4.0–11.0)

## 2018-03-11 LAB — POC URINE MACROSCOPIC
Glucose, Ur: NEGATIVE mg/dl
Glucose: NEGATIVE mg/dl
Nitrite, Urine: POSITIVE — AB
Nitrites: POSITIVE — AB
Protein, UA: >=300 mg/dl — AB
Protein: >=300 mg/dl — AB
Specific Gravity, UA: >=1.03 (ref 1.005–1.030)
Specific gravity: >=1.03 (ref 1.005–1.030)
Urobilinogen, UA, POCT: 0.2 EU/dl (ref 0.0–1.0)
Urobilinogen: 0.2 EU/dl (ref 0.0–1.0)
pH (UA): 5.5 (ref 5–9)
pH, UA: 5.5 (ref 5–9)

## 2018-03-11 LAB — POC URINE MICROSCOPIC

## 2018-03-11 LAB — LIPASE
Lipase: 128 U/L (ref 73–393)
Lipase: 128 U/L (ref 73–393)

## 2018-03-11 LAB — COMPREHENSIVE METABOLIC PANEL
ALT: 19 U/L (ref 12–78)
AST: 19 U/L (ref 15–37)
Albumin: 3.4 gm/dl (ref 3.4–5.0)
Alkaline Phosphatase: 108 U/L (ref 45–117)
Anion Gap: 7 mmol/L (ref 5–15)
BUN: 10 mg/dl (ref 7–25)
CO2: 24 mEq/L (ref 21–32)
Calcium: 8.6 mg/dl (ref 8.5–10.1)
Chloride: 109 mEq/L — ABNORMAL HIGH (ref 98–107)
Creatinine: 0.9 mg/dl (ref 0.6–1.3)
EGFR IF NonAfrican American: >60
GFR African American: >60
Glucose: 91 mg/dl (ref 74–106)
Potassium: 3.7 mEq/L (ref 3.5–5.1)
Sodium: 139 mEq/L (ref 136–145)
Total Bilirubin: 0.6 mg/dl (ref 0.2–1.0)
Total Protein: 7.3 gm/dl (ref 6.4–8.2)

## 2018-03-11 LAB — C. DIFFICILE/EPI PCR
C. DIFF TOXIN BY PCR: NEGATIVE
C. diff toxin by PCR: NEGATIVE

## 2018-03-11 LAB — POC HCG,URINE
HCG urine, QL: NEGATIVE
Pregnancy Test(Urn): NEGATIVE

## 2018-03-11 LAB — CBC WITH AUTOMATED DIFF
BASOPHILS: 0.1 % (ref 0–3)
EOSINOPHILS: 0.5 % (ref 0–5)
HCT: 38.6 % (ref 37.0–50.0)
HGB: 12.4 gm/dl — ABNORMAL LOW (ref 13.0–17.2)
IMMATURE GRANULOCYTES: 0.1 % (ref 0.0–3.0)
LYMPHOCYTES: 19.9 % — ABNORMAL LOW (ref 28–48)
MCH: 30.6 pg (ref 25.4–34.6)
MCHC: 32.1 gm/dl (ref 30.0–36.0)
MCV: 95.3 fL (ref 80.0–98.0)
MONOCYTES: 7.6 % (ref 1–13)
MPV: 11 fL — ABNORMAL HIGH (ref 6.0–10.0)
NEUTROPHILS: 71.8 % — ABNORMAL HIGH (ref 34–64)
NRBC: 0 (ref 0–0)
PLATELET: 257 10*3/uL (ref 140–450)
RBC: 4.05 M/uL (ref 3.60–5.20)
RDW-SD: 58.7 — ABNORMAL HIGH (ref 36.4–46.3)
WBC: 7.9 10*3/uL (ref 4.0–11.0)

## 2018-03-11 LAB — METABOLIC PANEL, COMPREHENSIVE
ALT (SGPT): 19 U/L (ref 12–78)
AST (SGOT): 19 U/L (ref 15–37)
Albumin: 3.4 gm/dl (ref 3.4–5.0)
Alk. phosphatase: 108 U/L (ref 45–117)
Anion gap: 7 mmol/L (ref 5–15)
BUN: 10 mg/dl (ref 7–25)
Bilirubin, total: 0.6 mg/dl (ref 0.2–1.0)
CO2: 24 mEq/L (ref 21–32)
Calcium: 8.6 mg/dl (ref 8.5–10.1)
Chloride: 109 mEq/L — ABNORMAL HIGH (ref 98–107)
Creatinine: 0.9 mg/dl (ref 0.6–1.3)
GFR est AA: >60
GFR est non-AA: >60
Glucose: 91 mg/dl (ref 74–106)
Potassium: 3.7 mEq/L (ref 3.5–5.1)
Protein, total: 7.3 gm/dl (ref 6.4–8.2)
Sodium: 139 mEq/L (ref 136–145)

## 2018-03-11 MED ORDER — CIPROFLOXACIN 500 MG TAB
500 mg | ORAL | Status: AC
Start: 2018-03-11 — End: 2018-03-11
  Administered 2018-03-11: 13:00:00 via ORAL

## 2018-03-11 MED ORDER — MORPHINE 4 MG/ML SYRINGE
4 mg/mL | INTRAMUSCULAR | Status: AC
Start: 2018-03-11 — End: 2018-03-11
  Administered 2018-03-11: 13:00:00 via INTRAVENOUS

## 2018-03-11 MED ORDER — LOPERAMIDE 2 MG CAP
2 mg | ORAL | Status: AC
Start: 2018-03-11 — End: 2018-03-11
  Administered 2018-03-11: 11:00:00 via ORAL

## 2018-03-11 MED ORDER — ONDANSETRON 4 MG TAB, RAPID DISSOLVE
4 mg | ORAL_TABLET | Freq: Three times a day (TID) | ORAL | 0 refills | Status: DC | PRN
Start: 2018-03-11 — End: 2018-04-01

## 2018-03-11 MED ORDER — DICYCLOMINE 20 MG TAB
20 mg | ORAL_TABLET | Freq: Four times a day (QID) | ORAL | 0 refills | Status: DC | PRN
Start: 2018-03-11 — End: 2018-04-01

## 2018-03-11 MED ORDER — CIPROFLOXACIN 500 MG TAB
500 mg | ORAL_TABLET | Freq: Two times a day (BID) | ORAL | 0 refills | Status: AC
Start: 2018-03-11 — End: 2018-03-14

## 2018-03-11 MED ORDER — ONDANSETRON (PF) 4 MG/2 ML INJECTION
4 mg/2 mL | Freq: Once | INTRAMUSCULAR | Status: AC
Start: 2018-03-11 — End: 2018-03-11
  Administered 2018-03-11: 11:00:00 via INTRAVENOUS

## 2018-03-11 MED ORDER — SODIUM CHLORIDE 0.9% BOLUS IV
0.9 % | INTRAVENOUS | Status: AC
Start: 2018-03-11 — End: 2018-03-11
  Administered 2018-03-11: 11:00:00 via INTRAVENOUS

## 2018-03-11 MED ORDER — DICYCLOMINE 10 MG CAP
10 mg | ORAL | Status: AC
Start: 2018-03-11 — End: 2018-03-11
  Administered 2018-03-11: 11:00:00 via ORAL

## 2018-03-11 MED ORDER — HYDROCODONE-ACETAMINOPHEN 5 MG-325 MG TAB
5-325 mg | ORAL_TABLET | Freq: Four times a day (QID) | ORAL | 0 refills | Status: AC | PRN
Start: 2018-03-11 — End: 2018-03-14

## 2018-03-11 MED FILL — ONDANSETRON (PF) 4 MG/2 ML INJECTION: 4 mg/2 mL | INTRAMUSCULAR | Qty: 2

## 2018-03-11 MED FILL — LOPERAMIDE 2 MG CAP: 2 mg | ORAL | Qty: 2

## 2018-03-11 MED FILL — CIPROFLOXACIN 500 MG TAB: 500 mg | ORAL | Qty: 1

## 2018-03-11 MED FILL — MORPHINE 4 MG/ML SYRINGE: 4 mg/mL | INTRAMUSCULAR | Qty: 1

## 2018-03-11 MED FILL — DICYCLOMINE 10 MG CAP: 10 mg | ORAL | Qty: 2

## 2018-03-11 NOTE — ED Notes (Signed)
PT STATES LOWER ABDOMINAL PAIN AND DIARRHEA THAT BEGAN THREE DAYS AGO, STATES TODAY INCREASED PAIN AND DIARRHEA.

## 2018-03-11 NOTE — ED Provider Notes (Signed)
[place]  Emergency Department Treatment Report        Patient: Ashley [name] Age: 32 y.o. Sex: female    Date of Birth: [birth year] Admit Date: 03/11/2018 PCP: Roosevelt Locks Da, MD   MRN: [id number]  CSN: [id number]     Room: ER23/ER23 Time Dictated: 6:40 AM            Chief Complaint   Chief Complaint   Patient presents with   ??? Abdominal Pain   ??? Diarrhea       History of Present Illness   This is a 32 y.o. female who presents complaining of diarrhea.  She states it has been ongoing for a week.  Over the last 3 days it has become profuse and watery.  No blood.  No pus.  She has not had fevers or chills.  She has had nausea and vomiting associated with it.  She has crampy abdominal pain that comes and goes.  It is worst just before defecating.  She has had no long travel.  No sick contacts.  No recent antibiotics.  No new pets.  She started to feel weak and lightheaded and dehydrated and finally came in.  It has been unresponsive to Pepto-Bismol    Review of Systems   Review of Systems   Constitutional: Positive for malaise/fatigue. Negative for chills and fever.   HENT: Negative for sore throat.    Eyes: Negative for blurred vision.   Respiratory: Negative for shortness of breath.    Cardiovascular: Negative for chest pain.   Gastrointestinal: Positive for abdominal pain, diarrhea, nausea and vomiting. Negative for blood in stool and melena.   Genitourinary: Positive for frequency. Negative for dysuria and flank pain.   Musculoskeletal: Negative for myalgias.   Skin: Negative for rash.   Neurological: Negative for focal weakness.   Psychiatric/Behavioral: Negative for substance abuse.       Past Medical/Surgical History     Past Medical History:   Diagnosis Date   ??? Ill-defined condition     bronchitis   ??? Pneumonia      Past Surgical History:   Procedure Laterality Date   ??? HX CESAREAN SECTION  2002, 2004, 2008    x 3   ??? HX CESAREAN SECTION     ??? 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 HX  GASTRIC BYPASS     ??? HX GASTRIC BYPASS           Social History     Social History     Socioeconomic History   ??? Marital status: SINGLE     Spouse name: Not on file   ??? Number of children: Not on file   ??? Years of education: Not on file   ??? Highest education level: Not on file   Occupational History   ??? Not on file   Social Needs   ??? Financial resource strain: Not on file   ??? Food insecurity:     Worry: Not on file     Inability: Not on file   ??? Transportation needs:     Medical: Not on file     Non-medical: Not on file   Tobacco Use   ??? Smoking status: Former Smoker     Types: Cigarettes     Last attempt to quit: 11/12/2009     Years since quitting: 8.3   ??? Smokeless tobacco: Never Used   Substance and Sexual Activity   ???  Alcohol use: Yes     Alcohol/week: 0.0 - 4.0 standard drinks     Comment: occassional   ??? Drug use: No   ??? Sexual activity: Yes     Partners: Male     Birth control/protection: None   Lifestyle   ??? Physical activity:     Days per week: Not on file     Minutes per session: Not on file   ??? Stress: Not on file   Relationships   ??? Social connections:     Talks on phone: Not on file     Gets together: Not on file     Attends religious service: Not on file     Active member of club or organization: Not on file     Attends meetings of clubs or organizations: Not on file     Relationship status: Not on file   ??? Intimate partner violence:     Fear of current or ex partner: Not on file     Emotionally abused: Not on file     Physically abused: Not on file     Forced sexual activity: Not on file   Other Topics Concern   ??? Not on file   Social History Narrative    ** Merged History Encounter **              Family History     Family History   Problem Relation Age of Onset   ??? Diabetes Mother    ??? Hypertension Father    ??? Heart Disease Maternal Grandmother    ??? Heart Attack Maternal Grandmother    ??? High Cholesterol Maternal Grandmother    ??? Arthritis-osteo Maternal Grandmother    ??? Stroke Paternal Aunt    ???  Kidney Disease Maternal Aunt          Current Medications     Current Facility-Administered Medications   Medication Dose Route Frequency Provider Last Rate Last Dose   ??? morphine injection 4 mg  4 mg IntraVENous NOW Suzzette Gasparro, Annice Needy, MD       ??? ciprofloxacin HCl (CIPRO) tablet 500 mg  500 mg Oral NOW Lennart Pall, MD         Current Outpatient Medications   Medication Sig Dispense Refill   ??? dicyclomine (BENTYL) 20 mg tablet Take 1 Tab by mouth every six (6) hours as needed (abdominal cramps) for up to 20 doses. 20 Tab 0   ??? ondansetron (ZOFRAN ODT) 4 mg disintegrating tablet Take 1 Tab by mouth every eight (8) hours as needed for Nausea. 20 Tab 0   ??? ciprofloxacin HCl (CIPRO) 500 mg tablet Take 1 Tab by mouth two (2) times a day for 3 days. 5 Tab 0   ??? HYDROcodone-acetaminophen (NORCO) 5-325 mg per tablet Take 1 Tab by mouth every six (6) hours as needed for Pain for up to 3 days. Max Daily Amount: 4 Tabs. 8 Tab 0   ??? lidocaine (LIDODERM) 5 % Apply patch to the affected area for 12 hours a day and remove for 12 hours a day. 30 Patch 0   ??? acetaminophen (TYLENOL) 325 mg tablet Take 2 Tabs by mouth every six (6) hours as needed for Pain. 20 Tab 0   ??? methocarbamol (ROBAXIN-750) 750 mg tablet Take 1 Tab by mouth four (4) times daily. 20 Tab 0   ??? cyanocobalamin (B-12 DOTS) 500 mcg tablet Take 500 mcg by mouth daily.  Allergies     Allergies   Allergen Reactions   ??? Aspirin Other (comments)     Stomach ulcer   ??? Ibuprofen Unknown (comments)     Gastric Bypass--advised to avoid   ??? Nsaids (Non-Steroidal Anti-Inflammatory Drug) Other (comments)     Hx gastric bypass       Physical Exam     Patient Vitals for the past 24 hrs:   Temp Pulse Resp BP SpO2   03/11/18 0439 98.1 ??F (36.7 ??C) (!) 109 22 128/89 100 %     Physical Exam  Vitals signs reviewed.   Constitutional:       General: She is not in acute distress.  HENT:      Head: Normocephalic and atraumatic.      Mouth/Throat:      Comments: Dry  Eyes:       Pupils: Pupils are equal, round, and reactive to light.   Neck:      Musculoskeletal: Normal range of motion and neck supple.   Cardiovascular:      Rate and Rhythm: Normal rate and regular rhythm.      Heart sounds: Normal heart sounds.   Pulmonary:      Effort: Pulmonary effort is normal. No respiratory distress.      Breath sounds: Normal breath sounds.   Abdominal:      General: Bowel sounds are normal.      Palpations: Abdomen is soft.      Tenderness: There is no tenderness.      Comments: Normal active bowel sounds.  No abdominal tenderness throughout the abdomen   Musculoskeletal:         General: No tenderness.   Skin:     General: Skin is warm and dry.      Findings: No rash.   Neurological:      Mental Status: She is alert and oriented to person, place, and time.      Cranial Nerves: No cranial nerve deficit.   Psychiatric:         Mood and Affect: Affect normal.         Judgment: Judgment normal.          Impression and Management Plan   Patient with diarrhea of a week's duration.  With no abdominal tenderness I do not believe she requires imaging.  She does not seem to have risk factors for C. difficile.  She will need hydration and symptom control and reevaluation    Diagnostic Studies   Lab:   Results for orders placed or performed during the hospital encounter of 03/11/18   C. DIFFICILE/EPI PCR   Result Value Ref Range    C. diff toxin by PCR Toxigenic C. difficile NEGATIVE Toxigenic C. difficile NEGATIVE     CBC WITH AUTOMATED DIFF   Result Value Ref Range    WBC 7.9 4.0 - 11.0 1000/mm3    RBC 4.05 3.60 - 5.20 M/uL    HGB 12.4 (L) 13.0 - 17.2 gm/dl    HCT 38.6 37.0 - 50.0 %    MCV 95.3 80.0 - 98.0 fL    MCH 30.6 25.4 - 34.6 pg    MCHC 32.1 30.0 - 36.0 gm/dl    PLATELET 257 140 - 450 1000/mm3    MPV 11.0 (H) 6.0 - 10.0 fL    RDW-SD 58.7 (H) 36.4 - 46.3      NRBC 0 0 - 0      IMMATURE GRANULOCYTES 0.1 0.0 -  3.0 %    NEUTROPHILS 71.8 (H) 34 - 64 %    LYMPHOCYTES 19.9 (L) 28 - 48 %    MONOCYTES 7.6  1 - 13 %    EOSINOPHILS 0.5 0 - 5 %    BASOPHILS 0.1 0 - 3 %   LIPASE   Result Value Ref Range    Lipase 128 73 - 829 U/L   METABOLIC PANEL, COMPREHENSIVE   Result Value Ref Range    Sodium 139 136 - 145 mEq/L    Potassium 3.7 3.5 - 5.1 mEq/L    Chloride 109 (H) 98 - 107 mEq/L    CO2 24 21 - 32 mEq/L    Glucose 91 74 - 106 mg/dl    BUN 10 7 - 25 mg/dl    Creatinine 0.9 0.6 - 1.3 mg/dl    GFR est AA >60.0      GFR est non-AA >60      Calcium 8.6 8.5 - 10.1 mg/dl    AST (SGOT) 19 15 - 37 U/L    ALT (SGPT) 19 12 - 78 U/L    Alk. phosphatase 108 45 - 117 U/L    Bilirubin, total 0.6 0.2 - 1.0 mg/dl    Protein, total 7.3 6.4 - 8.2 gm/dl    Albumin 3.4 3.4 - 5.0 gm/dl    Anion gap 7 5 - 15 mmol/L   POC URINE MACROSCOPIC   Result Value Ref Range    Glucose Negative NEGATIVE,Negative mg/dl    Bilirubin Small (A) NEGATIVE,Negative      Ketone Trace (A) NEGATIVE,Negative mg/dl    Specific gravity >=1.030 1.005 - 1.030      Blood Large (A) NEGATIVE,Negative      pH (UA) 5.5 5 - 9      Protein >=300 (A) NEGATIVE,Negative mg/dl    Urobilinogen 0.2 0.0 - 1.0 EU/dl    Nitrites Positive (A) NEGATIVE,Negative      Leukocyte Esterase Small (A) NEGATIVE,Negative      Color Yellow      Appearance Clear     POC URINE MICROSCOPIC   Result Value Ref Range    Epithelial cells, squamous 10-14 /LPF    WBC 5-9 /HPF    RBC TNTC /HPF    Bacteria 1+ /HPF   POC HCG,URINE   Result Value Ref Range    HCG urine, QL negative NEGATIVE,Negative,negative       Imaging:    No results found.    EKG:         ED Course       She remained stable.  She was hydrated.  Her tachycardia has largely resolved.  Her abdominal pain is resolved.  She had no time had abdominal tenderness.  Given that she has had a week's worth of copious watery diarrhea I believe 3 days of Cipro is reasonable    C. difficile is negative    Prescription for Cipro and Bentyl and Norco and Zofran    Medications   morphine injection 4 mg (has no administration in time range)   ciprofloxacin  HCl (CIPRO) tablet 500 mg (has no administration in time range)   ondansetron (ZOFRAN) injection 4 mg (4 mg IntraVENous Given 03/11/18 0607)   sodium chloride 0.9 % bolus infusion 1,000 mL (1,000 mL IntraVENous New Bag 03/11/18 0607)   dicyclomine (BENTYL) capsule 20 mg (20 mg Oral Given 03/11/18 0607)   loperamide (IMODIUM) capsule 4 mg (4 mg Oral Given 03/11/18 0607)  Medical Decision Making   I have answered all questions.  I have discussed my recommendations for follow-up and reasons to return.  I have discussed limitations and appropriate home care.   Final Diagnosis       ICD-10-CM ICD-9-CM   1. Infectious diarrhea A09 009.2   2. Abdominal cramping R10.9 789.00       Disposition   home    Srihan Brutus A. Dennison Bulla, MD F-ACEP  March 11, 2018    My signature above authenticates this document and my orders, the final ??  diagnosis (es), discharge prescription (s), and instructions in the Epic ??  record.  If you have any questions please contact [phone].  ??  Nursing notes have been reviewed by the physician/ advanced practice ??  Clinician.

## 2018-03-11 NOTE — Other (Cosign Needed)
OTHER    Called patient to follow up. Stool culture came back positive for aeromonas veronii (bacteria) LVM for patient to call back. If patient is still having symptoms (diarrhea) then we may want to change antibiotic. Per PA Thea SilversmithSarah Gregory. We should make sure that the patient has not traveled abroad in the past 30 days.

## 2018-03-11 NOTE — ED Provider Notes (Signed)
[place]  Emergency Department Treatment Report        Patient: Ashley [name] Age: 32 y.o. Sex: female    Date of Birth: 12-02-1985 Admit Date: 03/11/2018 PCP: Roosevelt Locks Da, MD   MRN: [id number]  CSN: [id number]     Room: ER23/ER23 Time Dictated: 6:40 AM            Chief Complaint   Chief Complaint   Patient presents with   ??? Abdominal Pain   ??? Diarrhea       History of Present Illness   This is a 32 y.o. female who presents complaining of diarrhea.  She states it has been ongoing for a week.  Over the last 3 days it has become profuse and watery.  No blood.  No pus.  She has not had fevers or chills.  She has had nausea and vomiting associated with it.  She has crampy abdominal pain that comes and goes.  It is worst just before defecating.  She has had no long travel.  No sick contacts.  No recent antibiotics.  No new pets.  She started to feel weak and lightheaded and dehydrated and finally came in.  It has been unresponsive to Pepto-Bismol    Review of Systems   Review of Systems   Constitutional: Positive for malaise/fatigue. Negative for chills and fever.   HENT: Negative for sore throat.    Eyes: Negative for blurred vision.   Respiratory: Negative for shortness of breath.    Cardiovascular: Negative for chest pain.   Gastrointestinal: Positive for abdominal pain, diarrhea, nausea and vomiting. Negative for blood in stool and melena.   Genitourinary: Positive for frequency. Negative for dysuria and flank pain.   Musculoskeletal: Negative for myalgias.   Skin: Negative for rash.   Neurological: Negative for focal weakness.   Psychiatric/Behavioral: Negative for substance abuse.       Past Medical/Surgical History     Past Medical History:   Diagnosis Date   ??? Ill-defined condition     bronchitis   ??? Pneumonia      Past Surgical History:   Procedure Laterality Date   ??? HX CESAREAN SECTION  2002, 2004, 2008    x 3   ??? HX CESAREAN SECTION     ??? 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 HX GASTRIC BYPASS     ??? HX GASTRIC BYPASS           Social History     Social History     Socioeconomic History   ??? Marital status: SINGLE     Spouse name: Not on file   ??? Number of children: Not on file   ??? Years of education: Not on file   ??? Highest education level: Not on file   Occupational History   ??? Not on file   Social Needs   ??? Financial resource strain: Not on file   ??? Food insecurity:     Worry: Not on file     Inability: Not on file   ??? Transportation needs:     Medical: Not on file     Non-medical: Not on file   Tobacco Use   ??? Smoking status: Former Smoker     Types: Cigarettes     Last attempt to quit: 11/12/2009     Years since quitting: 8.3   ??? Smokeless tobacco: Never Used   Substance and Sexual Activity   ???  Alcohol use: Yes     Alcohol/week: 0.0 - 4.0 standard drinks     Comment: occassional   ??? Drug use: No   ??? Sexual activity: Yes     Partners: Male     Birth control/protection: None   Lifestyle   ??? Physical activity:     Days per week: Not on file     Minutes per session: Not on file   ??? Stress: Not on file   Relationships   ??? Social connections:     Talks on phone: Not on file     Gets together: Not on file     Attends religious service: Not on file     Active member of club or organization: Not on file     Attends meetings of clubs or organizations: Not on file     Relationship status: Not on file   ??? Intimate partner violence:     Fear of current or ex partner: Not on file     Emotionally abused: Not on file     Physically abused: Not on file     Forced sexual activity: Not on file   Other Topics Concern   ??? Not on file   Social History Narrative    ** Merged History Encounter **              Family History     Family History   Problem Relation Age of Onset   ??? Diabetes Mother    ??? Hypertension Father    ??? Heart Disease Maternal Grandmother    ??? Heart Attack Maternal Grandmother    ??? High Cholesterol Maternal Grandmother    ??? Arthritis-osteo Maternal Grandmother    ??? Stroke Paternal Aunt     ??? Kidney Disease Maternal Aunt          Current Medications     Current Facility-Administered Medications   Medication Dose Route Frequency Provider Last Rate Last Dose   ??? morphine injection 4 mg  4 mg IntraVENous NOW Zelma Mazariego, Annice Needy, MD       ??? ciprofloxacin HCl (CIPRO) tablet 500 mg  500 mg Oral NOW Lennart Pall, MD         Current Outpatient Medications   Medication Sig Dispense Refill   ??? dicyclomine (BENTYL) 20 mg tablet Take 1 Tab by mouth every six (6) hours as needed (abdominal cramps) for up to 20 doses. 20 Tab 0   ??? ondansetron (ZOFRAN ODT) 4 mg disintegrating tablet Take 1 Tab by mouth every eight (8) hours as needed for Nausea. 20 Tab 0   ??? ciprofloxacin HCl (CIPRO) 500 mg tablet Take 1 Tab by mouth two (2) times a day for 3 days. 5 Tab 0   ??? HYDROcodone-acetaminophen (NORCO) 5-325 mg per tablet Take 1 Tab by mouth every six (6) hours as needed for Pain for up to 3 days. Max Daily Amount: 4 Tabs. 8 Tab 0   ??? lidocaine (LIDODERM) 5 % Apply patch to the affected area for 12 hours a day and remove for 12 hours a day. 30 Patch 0   ??? acetaminophen (TYLENOL) 325 mg tablet Take 2 Tabs by mouth every six (6) hours as needed for Pain. 20 Tab 0   ??? methocarbamol (ROBAXIN-750) 750 mg tablet Take 1 Tab by mouth four (4) times daily. 20 Tab 0   ??? cyanocobalamin (B-12 DOTS) 500 mcg tablet Take 500 mcg by mouth daily.  Allergies     Allergies   Allergen Reactions   ??? Aspirin Other (comments)     Stomach ulcer   ??? Ibuprofen Unknown (comments)     Gastric Bypass--advised to avoid   ??? Nsaids (Non-Steroidal Anti-Inflammatory Drug) Other (comments)     Hx gastric bypass       Physical Exam     Patient Vitals for the past 24 hrs:   Temp Pulse Resp BP SpO2   03/11/18 0439 98.1 ??F (36.7 ??C) (!) 109 22 128/89 100 %     Physical Exam  Vitals signs reviewed.   Constitutional:       General: She is not in acute distress.  HENT:      Head: Normocephalic and atraumatic.      Mouth/Throat:      Comments: Dry   Eyes:      Pupils: Pupils are equal, round, and reactive to light.   Neck:      Musculoskeletal: Normal range of motion and neck supple.   Cardiovascular:      Rate and Rhythm: Normal rate and regular rhythm.      Heart sounds: Normal heart sounds.   Pulmonary:      Effort: Pulmonary effort is normal. No respiratory distress.      Breath sounds: Normal breath sounds.   Abdominal:      General: Bowel sounds are normal.      Palpations: Abdomen is soft.      Tenderness: There is no tenderness.      Comments: Normal active bowel sounds.  No abdominal tenderness throughout the abdomen   Musculoskeletal:         General: No tenderness.   Skin:     General: Skin is warm and dry.      Findings: No rash.   Neurological:      Mental Status: She is alert and oriented to person, place, and time.      Cranial Nerves: No cranial nerve deficit.   Psychiatric:         Mood and Affect: Affect normal.         Judgment: Judgment normal.          Impression and Management Plan   Patient with diarrhea of a week's duration.  With no abdominal tenderness I do not believe she requires imaging.  She does not seem to have risk factors for C. difficile.  She will need hydration and symptom control and reevaluation    Diagnostic Studies   Lab:   Results for orders placed or performed during the hospital encounter of 03/11/18   C. DIFFICILE/EPI PCR   Result Value Ref Range    C. diff toxin by PCR Toxigenic C. difficile NEGATIVE Toxigenic C. difficile NEGATIVE     CBC WITH AUTOMATED DIFF   Result Value Ref Range    WBC 7.9 4.0 - 11.0 1000/mm3    RBC 4.05 3.60 - 5.20 M/uL    HGB 12.4 (L) 13.0 - 17.2 gm/dl    HCT 38.6 37.0 - 50.0 %    MCV 95.3 80.0 - 98.0 fL    MCH 30.6 25.4 - 34.6 pg    MCHC 32.1 30.0 - 36.0 gm/dl    PLATELET 257 140 - 450 1000/mm3    MPV 11.0 (H) 6.0 - 10.0 fL    RDW-SD 58.7 (H) 36.4 - 46.3      NRBC 0 0 - 0      IMMATURE GRANULOCYTES 0.1 0.0 -  3.0 %    NEUTROPHILS 71.8 (H) 34 - 64 %    LYMPHOCYTES 19.9 (L) 28 - 48 %     MONOCYTES 7.6 1 - 13 %    EOSINOPHILS 0.5 0 - 5 %    BASOPHILS 0.1 0 - 3 %   LIPASE   Result Value Ref Range    Lipase 128 73 - 542 U/L   METABOLIC PANEL, COMPREHENSIVE   Result Value Ref Range    Sodium 139 136 - 145 mEq/L    Potassium 3.7 3.5 - 5.1 mEq/L    Chloride 109 (H) 98 - 107 mEq/L    CO2 24 21 - 32 mEq/L    Glucose 91 74 - 106 mg/dl    BUN 10 7 - 25 mg/dl    Creatinine 0.9 0.6 - 1.3 mg/dl    GFR est AA >60.0      GFR est non-AA >60      Calcium 8.6 8.5 - 10.1 mg/dl    AST (SGOT) 19 15 - 37 U/L    ALT (SGPT) 19 12 - 78 U/L    Alk. phosphatase 108 45 - 117 U/L    Bilirubin, total 0.6 0.2 - 1.0 mg/dl    Protein, total 7.3 6.4 - 8.2 gm/dl    Albumin 3.4 3.4 - 5.0 gm/dl    Anion gap 7 5 - 15 mmol/L   POC URINE MACROSCOPIC   Result Value Ref Range    Glucose Negative NEGATIVE,Negative mg/dl    Bilirubin Small (A) NEGATIVE,Negative      Ketone Trace (A) NEGATIVE,Negative mg/dl    Specific gravity >=1.030 1.005 - 1.030      Blood Large (A) NEGATIVE,Negative      pH (UA) 5.5 5 - 9      Protein >=300 (A) NEGATIVE,Negative mg/dl    Urobilinogen 0.2 0.0 - 1.0 EU/dl    Nitrites Positive (A) NEGATIVE,Negative      Leukocyte Esterase Small (A) NEGATIVE,Negative      Color Yellow      Appearance Clear     POC URINE MICROSCOPIC   Result Value Ref Range    Epithelial cells, squamous 10-14 /LPF    WBC 5-9 /HPF    RBC TNTC /HPF    Bacteria 1+ /HPF   POC HCG,URINE   Result Value Ref Range    HCG urine, QL negative NEGATIVE,Negative,negative       Imaging:    No results found.    EKG:         ED Course       She remained stable.  She was hydrated.  Her tachycardia has largely resolved.  Her abdominal pain is resolved.  She had no time had abdominal tenderness.  Given that she has had a week's worth of copious watery diarrhea I believe 3 days of Cipro is reasonable    C. difficile is negative    Prescription for Cipro and Bentyl and Norco and Zofran    Medications    morphine injection 4 mg (has no administration in time range)   ciprofloxacin HCl (CIPRO) tablet 500 mg (has no administration in time range)   ondansetron (ZOFRAN) injection 4 mg (4 mg IntraVENous Given 03/11/18 0607)   sodium chloride 0.9 % bolus infusion 1,000 mL (1,000 mL IntraVENous New Bag 03/11/18 0607)   dicyclomine (BENTYL) capsule 20 mg (20 mg Oral Given 03/11/18 0607)   loperamide (IMODIUM) capsule 4 mg (4 mg Oral Given 03/11/18 0607)  Medical Decision Making   I have answered all questions.  I have discussed my recommendations for follow-up and reasons to return.  I have discussed limitations and appropriate home care.   Final Diagnosis       ICD-10-CM ICD-9-CM   1. Infectious diarrhea A09 009.2   2. Abdominal cramping R10.9 789.00       Disposition   home    Monroe Toure A. Dennison Bulla, MD F-ACEP  March 11, 2018    My signature above authenticates this document and my orders, the final ??  diagnosis (es), discharge prescription (s), and instructions in the Epic ??  record.  If you have any questions please contact [phone].  ??  Nursing notes have been reviewed by the physician/ advanced practice ??  Clinician.

## 2018-03-11 NOTE — Other (Signed)
9:08 AM  03/11/18     Discharge instructions given to patient (name) with verbalization of understanding. Patient accompanied by self.  Patient discharged with the following prescriptions zofran, cipro, bentyl, norco. Patient discharged to home (destination).      Clover Mealyanya Farinella, RN

## 2018-03-11 NOTE — Other (Cosign Needed)
LAB  We have sent [id number] [phone].  See final stool culture

## 2018-03-11 NOTE — ED Triage Notes (Signed)
PT STATES LOWER ABDOMINAL PAIN AND DIARRHEA THAT BEGAN THREE DAYS AGO, STATES TODAY INCREASED PAIN AND DIARRHEA.

## 2018-03-15 LAB — CULTURE, STOOL

## 2018-03-20 LAB — OVA AND PARASITE SCREEN

## 2018-03-20 LAB — OVA & PARASITES, STOOL

## 2018-03-31 DIAGNOSIS — K858 Other acute pancreatitis without necrosis or infection: Secondary | ICD-10-CM

## 2018-03-31 NOTE — ED Provider Notes (Signed)
EMERGENCY DEPARTMENT HISTORY AND PHYSICAL EXAM    Date: 03/31/2018  Patient Name: Ashley [name]    History of Presenting Illness     Chief Complaint   Patient presents with   ??? Abdominal Pain   ??? Nausea         History Provided By: Patient    Ashley [name] is a 33 y.o. female who presents to the emergency department C/O abdominal pain. Associated sxs include nausea vomiting.  Patient reports 2 days of intermittent upper abdominal pain with nausea.  Patient states the pain has been intensifying and becoming more constant.  Patient states that today it worsened right upper quadrant seem to wrap around to her back with nausea and vomiting.  She states she also has some pain in her right lower quadrant.  Patient history of gastric bypass 9 years ago has never had pain like this before.  Pt denies diarrhea, bloody stools, black stools, and any other sxs or complaints.     PCP: Chipper Herb Da, MD    Current Outpatient Medications   Medication Sig Dispense Refill   ??? ondansetron (ZOFRAN ODT) 4 mg disintegrating tablet Take 1 Tab by mouth every eight (8) hours as needed for Nausea. 20 Tab 0   ??? dicyclomine (BENTYL) 20 mg tablet Take 1 Tab by mouth every six (6) hours as needed (abdominal cramps) for up to 20 doses. 20 Tab 0   ??? oxyCODONE-acetaminophen (PERCOCET) 5-325 mg per tablet Take 1 Tab by mouth every four (4) hours as needed for Pain for up to 7 days. Max Daily Amount: 6 Tabs. 20 Tab 0   ??? cephALEXin (KEFLEX) 500 mg capsule Take 1 Cap by mouth four (4) times daily for 10 days. 40 Cap 0   ??? lidocaine (LIDODERM) 5 % Apply patch to the affected area for 12 hours a day and remove for 12 hours a day. 30 Patch 0   ??? acetaminophen (TYLENOL) 325 mg tablet Take 2 Tabs by mouth every six (6) hours as needed for Pain. 20 Tab 0   ??? methocarbamol (ROBAXIN-750) 750 mg tablet Take 1 Tab by mouth four (4) times daily. 20 Tab 0   ??? cyanocobalamin (B-12 DOTS) 500 mcg tablet Take 500 mcg by mouth daily.           Past History     Past  Medical History:  Past Medical History:   Diagnosis Date   ??? Ill-defined condition     bronchitis   ??? Pneumonia        Past Surgical History:  Past Surgical History:   Procedure Laterality Date   ??? HX CESAREAN SECTION  2002, 2004, 2008    x 3   ??? HX CESAREAN SECTION     ??? HX GASTRIC BYPASS  10-2009    [facility]   ??? HX GASTRIC BYPASS     ??? HX GASTRIC BYPASS         Family History:  Family History   Problem Relation Age of Onset   ??? Diabetes Mother    ??? Hypertension Father    ??? Heart Disease Maternal Grandmother    ??? Heart Attack Maternal Grandmother    ??? High Cholesterol Maternal Grandmother    ??? Arthritis-osteo Maternal Grandmother    ??? Stroke Paternal Aunt    ??? Kidney Disease Maternal Aunt        Social History:  Social History     Tobacco Use   ??? Smoking status:  Former Smoker     Types: Cigarettes     Last attempt to quit: 11/12/2009     Years since quitting: 8.3   ??? Smokeless tobacco: Never Used   Substance Use Topics   ??? Alcohol use: Not Currently     Alcohol/week: 0.0 - 4.0 standard drinks     Comment: occassional   ??? Drug use: No       Allergies:  Allergies   Allergen Reactions   ??? Aspirin Other (comments)     Stomach ulcer   ??? Ibuprofen Unknown (comments)     Gastric Bypass--advised to avoid   ??? Nsaids (Non-Steroidal Anti-Inflammatory Drug) Other (comments)     Hx gastric bypass         Review of Systems   Review of Systems   Gastrointestinal: Positive for abdominal pain, nausea and vomiting. Negative for blood in stool, constipation and diarrhea.   Genitourinary: Negative for dysuria and hematuria.   All other systems reviewed and are negative.      Physical Exam     Vitals:    04/01/18 0300 04/01/18 0430 04/01/18 0500 04/01/18 0548   BP: 116/71 116/64 97/55 112/71   Pulse:       Resp:    18   Temp:       SpO2: 100% 99% 100% 100%   Weight:       Height:         Physical Exam  Vitals signs and nursing note reviewed.   Constitutional:       Appearance: She is well-developed. She is obese. She is not  ill-appearing.   HENT:      Head: Normocephalic and atraumatic.      Mouth/Throat:      Mouth: Mucous membranes are moist.   Eyes:      General: No scleral icterus.     Extraocular Movements: Extraocular movements intact.      Pupils: Pupils are equal, round, and reactive to light.   Cardiovascular:      Rate and Rhythm: Normal rate and regular rhythm.   Pulmonary:      Effort: Pulmonary effort is normal.      Breath sounds: Normal breath sounds.   Abdominal:      General: Bowel sounds are normal.      Palpations: Abdomen is soft.      Tenderness: There is tenderness in the right upper quadrant, right lower quadrant and epigastric area. There is no right CVA tenderness or left CVA tenderness.   Skin:     General: Skin is warm and dry.      Capillary Refill: Capillary refill takes less than 2 seconds.   Neurological:      General: No focal deficit present.      Mental Status: She is alert and oriented to person, place, and time.   Psychiatric:         Mood and Affect: Mood normal.         Behavior: Behavior normal.         Diagnostic Study Results     Labs -     No results found for this or any previous visit (from the past 12 hour(s)).    Radiologic Studies -   CT ABD PELV W CONT   Final Result   IMPRESSION:      Infiltrative change along the pancreatic head-duodenum extending inferiorly.   Suspect pancreatitis. Duodenitis is also a consideration.      Fatty infiltration  of the liver.      There appears to be urinary bladder wall thickening which needs correlation with   urinalysis.              CT Results  (Last 48 hours)               04/01/18 0050  CT ABD PELV W CONT Final result    Impression:  IMPRESSION:       Infiltrative change along the pancreatic head-duodenum extending inferiorly.   Suspect pancreatitis. Duodenitis is also a consideration.       Fatty infiltration of the liver.       There appears to be urinary bladder wall thickening which needs correlation with   urinalysis.               Narrative:   EXAM: CT of the abdomen and pelvis       INDICATION: Pain.       COMPARISON: None.       TECHNIQUE: Axial CT imaging of the abdomen and pelvis was performed with   intravenous contrast. Multiplanar reformats were generated. Dose reduction   techniques used: automated exposure control, adjustment of the mAs and/or kVp   according to patient size, and iterative reconstruction techniques. Digital   imaging and communications in Medicine (DICOM) format image data are available   to nonaffiliated external healthcare facilities or entities on a secure, media   free, reciprocally searchable basis with patient authorization for at least 12   month after this study.   _______________       FINDINGS:       LOWER CHEST: Unremarkable.       LIVER, BILIARY: The liver is of diminished attenuation. No biliary dilation.   Gallbladder is unremarkable.       PANCREAS: There is infiltrative change/fluid along the pancreatic head/duodenum   extending inferiorly.       SPLEEN: Normal.       ADRENALS: Normal.       KIDNEYS: Kidneys are normal in appearance. There does appear to be urinary   bladder wall thickening.       LYMPH NODES: No enlarged lymph nodes.       GASTROINTESTINAL TRACT: There is been a prior gastric bypass procedure. The   appendix is visualized and is within normal limits.        PELVIC ORGANS: Unremarkable.       VASCULATURE: Unremarkable.       BONES: No acute or aggressive osseous abnormalities identified.       OTHER: None.       _______________               CXR Results  (Last 48 hours)    None          Medications given in the ED-  Medications   sodium chloride 0.9 % bolus infusion 1,000 mL (0 mL IntraVENous IV Completed 04/01/18 0457)   ondansetron (ZOFRAN) injection 4 mg (4 mg IntraVENous Given 04/01/18 0054)   pantoprazole (PROTONIX) injection 40 mg (40 mg IntraVENous Given 04/01/18 0053)   iopamidoL (ISOVUE 300) 61 % contrast injection 100 mL (100 mL IntraVENous Given 04/01/18 0042)   cefTRIAXone (ROCEPHIN) 1 g  in sterile water (preservative free) 10 mL IV syringe (1 g IntraVENous Given 04/01/18 0053)   dicyclomine (BENTYL) capsule 20 mg (20 mg Oral Given 04/01/18 0149)   morphine injection 4 mg (4 mg IntraVENous Given 04/01/18 0410)   ondansetron (ZOFRAN)  injection 4 mg (4 mg IntraVENous Given 04/01/18 0409)   sodium chloride 0.9 % bolus infusion 1,000 mL (0 mL IntraVENous IV Completed 04/01/18 0548)   oxyCODONE-acetaminophen (PERCOCET) 5-325 mg per tablet 1 Tab (1 Tab Oral Given 04/01/18 1610)         Medical Decision Making   I am the first provider for this patient.    I reviewed the vital signs, available nursing notes, past medical history, past surgical history, family history and social history.    Vital Signs-Reviewed the patient's vital signs.    Pulse Oximetry Analysis - 100% on RA     Records Reviewed: Nursing Notes    Procedures:  Procedures    ED Course:   11:56 PM   Initial assessment performed. The patients presenting problems have been discussed, and they are in agreement with the care plan formulated and outlined with them.  I have encouraged them to ask questions as they arise throughout their visit.    BEDSIDE SIGN OUT:  12:51 AM  Discussed pt's hx, disposition, and available diagnostic and imaging results with Duane Lope. Edilia Bo, MD at bedside with the patient. Reviewed care plans. Both providers and patient are in agreement with care plan. Synthia Innocent, PA-C is transferring care of the pt to Post D. Edilia Bo, MD at this time.   Written by Hughes Better, ED Scribe, as dictated by Duane Lope Edilia Bo, MD.      PROGRESS NOTE:  5:29 AM  Pt and/or family have been updated on their results.  She understands the diagnosis of acute pancreatitis and that its cause is likely secondary to her bariatric surgery as opposed to alcohol ingestion biliary disease or hyperlipidemia.  We discussed inpatient versus outpatient treatment, she clearly desires to go home however has concerns about pain control.  We were able to  control her pain relatively easily within the emergency department transitioned her to oral meds suggested clear fluids with slow progression to full liquids and bland diet followed by regular diet as tolerated she has prescriptions for both light intensity and stronger intensity pain medications as well as antiemetics and would asked that she return should she suffer any complications after her discharge.     Pt and/or pt's family are aware of the plan of care and are in agreement.  Written by Lina Sar Ala Dach, ED Scribe, as dictated by Duane Lope Edilia Bo, MD.        Diagnosis and Disposition       DISCHARGE NOTE:  5:29 AM  Larrie L Settle's  results have been reviewed with her.  She has been counseled regarding her diagnosis, treatment, and plan.  She verbally conveys understanding and agreement of the signs, symptoms, diagnosis, treatment and prognosis and additionally agrees to follow up as discussed.  She also agrees with the care-plan and conveys that all of her questions have been answered.  I have also provided discharge instructions for her that include: educational information regarding their diagnosis and treatment, and list of reasons why they would want to return to the ED prior to their follow-up appointment, should her condition change. She has been provided with education for proper emergency department utilization.     CLINICAL IMPRESSION:    1. Other acute pancreatitis, unspecified complication status    2. S/P bariatric surgery    3. Acute UTI        PLAN:  1. D/C Home  2.   Discharge Medication List as of 04/01/2018  5:28 AM  START taking these medications    Details   oxyCODONE-acetaminophen (PERCOCET) 5-325 mg per tablet Take 1 Tab by mouth every four (4) hours as needed for Pain for up to 7 days. Max Daily Amount: 6 Tabs., Print, Disp-20 Tab, R-0      cephALEXin (KEFLEX) 500 mg capsule Take 1 Cap by mouth four (4) times daily for 10 days., Print, Disp-40 Cap, R-0         CONTINUE these  medications which have CHANGED    Details   ondansetron (ZOFRAN ODT) 4 mg disintegrating tablet Take 1 Tab by mouth every eight (8) hours as needed for Nausea., Print, Disp-20 Tab, R-0      dicyclomine (BENTYL) 20 mg tablet Take 1 Tab by mouth every six (6) hours as needed (abdominal cramps) for up to 20 doses., Print, Disp-20 Tab, R-0         CONTINUE these medications which have NOT CHANGED    Details   lidocaine (LIDODERM) 5 % Apply patch to the affected area for 12 hours a day and remove for 12 hours a day., Print, Disp-30 Patch, R-0      acetaminophen (TYLENOL) 325 mg tablet Take 2 Tabs by mouth every six (6) hours as needed for Pain., Print, Disp-20 Tab, R-0      methocarbamol (ROBAXIN-750) 750 mg tablet Take 1 Tab by mouth four (4) times daily., Print, Disp-20 Tab, R-0      cyanocobalamin (B-12 DOTS) 500 mcg tablet Take 500 mcg by mouth daily.  Historical Med, 500 mcg           3.   Follow-up Information     Follow up With Specialties Details Why Contact Info    Chipper HerbZhang, Da, MD [facility] Schedule an appointment as soon as possible for a visit for primary care follow up [address]  [place] [id number]  [phone]      [facility] Emergency Medicine  As needed, If symptoms worsen [address]  [place] [id number]  [phone]            Please note that this dictation was completed with Dragon, the computer voice recognition software.  Quite often unanticipated grammatical, syntax, homophones, and other interpretive errors are inadvertently transcribed by the computer software.  Please disregard these errors.  Please excuse any errors that have escaped final proofreading.    Attestation:  This note is prepared in-part by Nehemiah SettleBrooke A. Ford, acting as Neurosurgeoncribe for Pepco HoldingsJeffrey D. Edilia Boickson, MD, after sign out note (bedside transfer of care) above.  ??  Duane LopeJeffrey D. Edilia Boickson, MD: The scribe's documentation has been prepared under my direction and personally reviewed by me in its entirety.  I confirm that the note above accurately reflects all work, treatment, procedures, and medical decision making performed by me.

## 2018-03-31 NOTE — ED Notes (Signed)
Pt arrives c/o right sided abd pain that radiates to back and is tender to tough. This has been going on for x2 days.

## 2018-03-31 NOTE — ED Triage Notes (Signed)
Pt arrives c/o right sided abd pain that radiates to back and is tender to tough. This has been going on for x2 days.

## 2018-03-31 NOTE — ED Provider Notes (Addendum)
EMERGENCY DEPARTMENT HISTORY AND PHYSICAL EXAM    Date: 03/31/2018  Patient Name: Ashley [name]    History of Presenting Illness     Chief Complaint   Patient presents with   ??? Abdominal Pain   ??? Nausea         History Provided By: Patient    Ashley [name] is a 33 y.o. female who presents to the emergency department C/O abdominal pain. Associated sxs include nausea vomiting.  Patient reports 2 days of intermittent upper abdominal pain with nausea.  Patient states the pain has been intensifying and becoming more constant.  Patient states that today it worsened right upper quadrant seem to wrap around to her back with nausea and vomiting.  She states she also has some pain in her right lower quadrant.  Patient history of gastric bypass 9 years ago has never had pain like this before.  Pt denies diarrhea, bloody stools, black stools, and any other sxs or complaints.     PCP: Chipper Herb Da, MD    Current Outpatient Medications   Medication Sig Dispense Refill   ??? ondansetron (ZOFRAN ODT) 4 mg disintegrating tablet Take 1 Tab by mouth every eight (8) hours as needed for Nausea. 20 Tab 0   ??? dicyclomine (BENTYL) 20 mg tablet Take 1 Tab by mouth every six (6) hours as needed (abdominal cramps) for up to 20 doses. 20 Tab 0   ??? oxyCODONE-acetaminophen (PERCOCET) 5-325 mg per tablet Take 1 Tab by mouth every four (4) hours as needed for Pain for up to 7 days. Max Daily Amount: 6 Tabs. 20 Tab 0   ??? cephALEXin (KEFLEX) 500 mg capsule Take 1 Cap by mouth four (4) times daily for 10 days. 40 Cap 0   ??? lidocaine (LIDODERM) 5 % Apply patch to the affected area for 12 hours a day and remove for 12 hours a day. 30 Patch 0   ??? acetaminophen (TYLENOL) 325 mg tablet Take 2 Tabs by mouth every six (6) hours as needed for Pain. 20 Tab 0   ??? methocarbamol (ROBAXIN-750) 750 mg tablet Take 1 Tab by mouth four (4) times daily. 20 Tab 0   ??? cyanocobalamin (B-12 DOTS) 500 mcg tablet Take 500 mcg by mouth daily.           Past History      Past Medical History:  Past Medical History:   Diagnosis Date   ??? Ill-defined condition     bronchitis   ??? Pneumonia        Past Surgical History:  Past Surgical History:   Procedure Laterality Date   ??? HX CESAREAN SECTION  2002, 2004, 2008    x 3   ??? HX CESAREAN SECTION     ??? HX GASTRIC BYPASS  10-2009    [facility]   ??? HX GASTRIC BYPASS     ??? HX GASTRIC BYPASS         Family History:  Family History   Problem Relation Age of Onset   ??? Diabetes Mother    ??? Hypertension Father    ??? Heart Disease Maternal Grandmother    ??? Heart Attack Maternal Grandmother    ??? High Cholesterol Maternal Grandmother    ??? Arthritis-osteo Maternal Grandmother    ??? Stroke Paternal Aunt    ??? Kidney Disease Maternal Aunt        Social History:  Social History     Tobacco Use   ??? Smoking status:  Former Smoker     Types: Cigarettes     Last attempt to quit: 11/12/2009     Years since quitting: 8.3   ??? Smokeless tobacco: Never Used   Substance Use Topics   ??? Alcohol use: Not Currently     Alcohol/week: 0.0 - 4.0 standard drinks     Comment: occassional   ??? Drug use: No       Allergies:  Allergies   Allergen Reactions   ??? Aspirin Other (comments)     Stomach ulcer   ??? Ibuprofen Unknown (comments)     Gastric Bypass--advised to avoid   ??? Nsaids (Non-Steroidal Anti-Inflammatory Drug) Other (comments)     Hx gastric bypass         Review of Systems   Review of Systems   Gastrointestinal: Positive for abdominal pain, nausea and vomiting. Negative for blood in stool, constipation and diarrhea.   Genitourinary: Negative for dysuria and hematuria.   All other systems reviewed and are negative.      Physical Exam     Vitals:    04/01/18 0300 04/01/18 0430 04/01/18 0500 04/01/18 0548   BP: 116/71 116/64 97/55 112/71   Pulse:       Resp:    18   Temp:       SpO2: 100% 99% 100% 100%   Weight:       Height:         Physical Exam  Vitals signs and nursing note reviewed.   Constitutional:       Appearance: She is well-developed. She is obese. She is not  ill-appearing.   HENT:      Head: Normocephalic and atraumatic.      Mouth/Throat:      Mouth: Mucous membranes are moist.   Eyes:      General: No scleral icterus.     Extraocular Movements: Extraocular movements intact.      Pupils: Pupils are equal, round, and reactive to light.   Cardiovascular:      Rate and Rhythm: Normal rate and regular rhythm.   Pulmonary:      Effort: Pulmonary effort is normal.      Breath sounds: Normal breath sounds.   Abdominal:      General: Bowel sounds are normal.      Palpations: Abdomen is soft.      Tenderness: There is tenderness in the right upper quadrant, right lower quadrant and epigastric area. There is no right CVA tenderness or left CVA tenderness.   Skin:     General: Skin is warm and dry.      Capillary Refill: Capillary refill takes less than 2 seconds.   Neurological:      General: No focal deficit present.      Mental Status: She is alert and oriented to person, place, and time.   Psychiatric:         Mood and Affect: Mood normal.         Behavior: Behavior normal.         Diagnostic Study Results     Labs -     No results found for this or any previous visit (from the past 12 hour(s)).    Radiologic Studies -   CT ABD PELV W CONT   Final Result   IMPRESSION:      Infiltrative change along the pancreatic head-duodenum extending inferiorly.   Suspect pancreatitis. Duodenitis is also a consideration.      Fatty infiltration  of the liver.      There appears to be urinary bladder wall thickening which needs correlation with   urinalysis.              CT Results  (Last 48 hours)               04/01/18 0050  CT ABD PELV W CONT Final result    Impression:  IMPRESSION:       Infiltrative change along the pancreatic head-duodenum extending inferiorly.   Suspect pancreatitis. Duodenitis is also a consideration.       Fatty infiltration of the liver.       There appears to be urinary bladder wall thickening which needs correlation with   urinalysis.                Narrative:  EXAM: CT of the abdomen and pelvis       INDICATION: Pain.       COMPARISON: None.       TECHNIQUE: Axial CT imaging of the abdomen and pelvis was performed with   intravenous contrast. Multiplanar reformats were generated. Dose reduction   techniques used: automated exposure control, adjustment of the mAs and/or kVp   according to patient size, and iterative reconstruction techniques. Digital   imaging and communications in Medicine (DICOM) format image data are available   to nonaffiliated external healthcare facilities or entities on a secure, media   free, reciprocally searchable basis with patient authorization for at least 12   month after this study.   _______________       FINDINGS:       LOWER CHEST: Unremarkable.       LIVER, BILIARY: The liver is of diminished attenuation. No biliary dilation.   Gallbladder is unremarkable.       PANCREAS: There is infiltrative change/fluid along the pancreatic head/duodenum   extending inferiorly.       SPLEEN: Normal.       ADRENALS: Normal.       KIDNEYS: Kidneys are normal in appearance. There does appear to be urinary   bladder wall thickening.       LYMPH NODES: No enlarged lymph nodes.       GASTROINTESTINAL TRACT: There is been a prior gastric bypass procedure. The   appendix is visualized and is within normal limits.        PELVIC ORGANS: Unremarkable.       VASCULATURE: Unremarkable.       BONES: No acute or aggressive osseous abnormalities identified.       OTHER: None.       _______________               CXR Results  (Last 48 hours)    None          Medications given in the ED-  Medications   sodium chloride 0.9 % bolus infusion 1,000 mL (0 mL IntraVENous IV Completed 04/01/18 0457)   ondansetron (ZOFRAN) injection 4 mg (4 mg IntraVENous Given 04/01/18 0054)   pantoprazole (PROTONIX) injection 40 mg (40 mg IntraVENous Given 04/01/18 0053)   iopamidoL (ISOVUE 300) 61 % contrast injection 100 mL (100 mL IntraVENous Given 04/01/18 0042)    cefTRIAXone (ROCEPHIN) 1 g in sterile water (preservative free) 10 mL IV syringe (1 g IntraVENous Given 04/01/18 0053)   dicyclomine (BENTYL) capsule 20 mg (20 mg Oral Given 04/01/18 0149)   morphine injection 4 mg (4 mg IntraVENous Given 04/01/18 0410)   ondansetron (ZOFRAN)  injection 4 mg (4 mg IntraVENous Given 04/01/18 0409)   sodium chloride 0.9 % bolus infusion 1,000 mL (0 mL IntraVENous IV Completed 04/01/18 0548)   oxyCODONE-acetaminophen (PERCOCET) 5-325 mg per tablet 1 Tab (1 Tab Oral Given 04/01/18 [id number])         Medical Decision Making   I am the first provider for this patient.    I reviewed the vital signs, available nursing notes, past medical history, past surgical history, family history and social history.    Vital Signs-Reviewed the patient's vital signs.    Pulse Oximetry Analysis - 100% on RA     Records Reviewed: Nursing Notes    Procedures:  Procedures    ED Course:   11:56 PM   Initial assessment performed. The patients presenting problems have been discussed, and they are in agreement with the care plan formulated and outlined with them.  I have encouraged them to ask questions as they arise throughout their visit.    BEDSIDE SIGN OUT:  12:51 AM  Discussed pt's hx, disposition, and available diagnostic and imaging results with Duane LopeJeffrey D. Edilia Boickson, MD at bedside with the patient. Reviewed care plans. Both providers and patient are in agreement with care plan. Synthia InnocentBrannon Cater, PA-C is transferring care of the pt to Pine RidgeJeffrey D. Edilia Boickson, MD at this time.   Written by Hughes BetterBrooke A Ford, ED Scribe, as dictated by Duane LopeJeffrey D. Edilia Boickson, MD.      PROGRESS NOTE:  5:29 AM  Pt and/or family have been updated on their results.  She understands the diagnosis of acute pancreatitis and that its cause is likely secondary to her bariatric surgery as opposed to alcohol ingestion biliary disease or hyperlipidemia.  We discussed inpatient versus outpatient treatment, she  clearly desires to go home however has concerns about pain control.  We were able to control her pain relatively easily within the emergency department transitioned her to oral meds suggested clear fluids with slow progression to full liquids and bland diet followed by regular diet as tolerated she has prescriptions for both light intensity and stronger intensity pain medications as well as antiemetics and would asked that she return should she suffer any complications after her discharge.     Pt and/or pt's family are aware of the plan of care and are in agreement.  Written by Lina SarBrooke A. Ala DachFord, ED Scribe, as dictated by Duane LopeJeffrey D. Edilia Boickson, MD.        Diagnosis and Disposition       DISCHARGE NOTE:  5:29 AM  Ashley [name]'s  results have been reviewed with her.  She has been counseled regarding her diagnosis, treatment, and plan.  She verbally conveys understanding and agreement of the signs, symptoms, diagnosis, treatment and prognosis and additionally agrees to follow up as discussed.  She also agrees with the care-plan and conveys that all of her questions have been answered.  I have also provided discharge instructions for her that include: educational information regarding their diagnosis and treatment, and list of reasons why they would want to return to the ED prior to their follow-up appointment, should her condition change. She has been provided with education for proper emergency department utilization.     CLINICAL IMPRESSION:    1. Other acute pancreatitis, unspecified complication status    2. S/P bariatric surgery    3. Acute UTI        PLAN:  1. D/C Home  2.   Discharge Medication List as of 04/01/2018  5:28 AM  START taking these medications    Details   oxyCODONE-acetaminophen (PERCOCET) 5-325 mg per tablet Take 1 Tab by mouth every four (4) hours as needed for Pain for up to 7 days. Max Daily Amount: 6 Tabs., Print, Disp-20 Tab, R-0       cephALEXin (KEFLEX) 500 mg capsule Take 1 Cap by mouth four (4) times daily for 10 days., Print, Disp-40 Cap, R-0         CONTINUE these medications which have CHANGED    Details   ondansetron (ZOFRAN ODT) 4 mg disintegrating tablet Take 1 Tab by mouth every eight (8) hours as needed for Nausea., Print, Disp-20 Tab, R-0      dicyclomine (BENTYL) 20 mg tablet Take 1 Tab by mouth every six (6) hours as needed (abdominal cramps) for up to 20 doses., Print, Disp-20 Tab, R-0         CONTINUE these medications which have NOT CHANGED    Details   lidocaine (LIDODERM) 5 % Apply patch to the affected area for 12 hours a day and remove for 12 hours a day., Print, Disp-30 Patch, R-0      acetaminophen (TYLENOL) 325 mg tablet Take 2 Tabs by mouth every six (6) hours as needed for Pain., Print, Disp-20 Tab, R-0      methocarbamol (ROBAXIN-750) 750 mg tablet Take 1 Tab by mouth four (4) times daily., Print, Disp-20 Tab, R-0      cyanocobalamin (B-12 DOTS) 500 mcg tablet Take 500 mcg by mouth daily.  Historical Med, 500 mcg           3.   Follow-up Information     Follow up With Specialties Details Why Contact Info    Chipper HerbZhang, Da, MD [facility] Schedule an appointment as soon as possible for a visit for primary care follow up [address]  [place] [id number]  [phone]      [facility] EM[facility] Emergency Medicine  As needed, If symptoms worsen [address]  [place] [id number]  [phone]            Please note that this dictation was completed with Dragon, the computer voice recognition software.  Quite often unanticipated grammatical, syntax, homophones, and other interpretive errors are inadvertently transcribed by the computer software.  Please disregard these errors.  Please excuse any errors that have escaped final proofreading.    Attestation:  This note is prepared in-part by Nehemiah SettleBrooke A. Ford, acting as Neurosurgeoncribe for Pepco HoldingsJeffrey D. Edilia Boickson, MD, after sign out note (bedside transfer of care) above.   ??  Duane LopeJeffrey D. Edilia Boickson, MD: The scribe's documentation has been prepared under my direction and personally reviewed by me in its entirety. I confirm that the note above accurately reflects all work, treatment, procedures, and medical decision making performed by me.

## 2018-04-01 ENCOUNTER — Emergency Department: Admit: 2018-04-01 | Payer: MEDICAID | Primary: Family Medicine

## 2018-04-01 ENCOUNTER — Inpatient Hospital Stay
Admit: 2018-04-01 | Discharge: 2018-04-01 | Disposition: A | Discharge status: Home / Self Care | Payer: MEDICAID | Attending: Emergency Medicine

## 2018-04-01 LAB — URINALYSIS W/ RFLX MICROSCOPIC
Bilirubin, Urine: NEGATIVE
Bilirubin: NEGATIVE
Glucose, Ur: NEGATIVE mg/dL
Glucose: NEGATIVE mg/dL
Ketone: NEGATIVE mg/dL
Ketones, Urine: NEGATIVE mg/dL
Nitrite, Urine: NEGATIVE
Nitrites: NEGATIVE
Protein, UA: 30 mg/dL — AB
Protein: 30 mg/dL — AB
Specific Gravity, UA: 1.026 (ref 1.005–1.030)
Specific gravity: 1.026 (ref 1.005–1.030)
Urobilinogen, UA, POCT: 1 EU/dL (ref 0.2–1.0)
Urobilinogen: 1 EU/dL (ref 0.2–1.0)
pH (UA): 5.5 (ref 5.0–8.0)
pH, UA: 5.5 (ref 5.0–8.0)

## 2018-04-01 LAB — CBC WITH AUTO DIFFERENTIAL
Basophils %: 0 % (ref 0–2)
Basophils Absolute: 0 10*3/uL (ref 0.0–0.1)
Eosinophils %: 1 % (ref 0–5)
Eosinophils Absolute: 0 10*3/uL (ref 0.0–0.4)
Hematocrit: 37.1 % (ref 35.0–45.0)
Hemoglobin: 12.1 g/dL (ref 12.0–16.0)
Lymphocytes %: 20 % — ABNORMAL LOW (ref 21–52)
Lymphocytes Absolute: 1.8 10*3/uL (ref 0.9–3.6)
MCH: 30.8 PG (ref 24.0–34.0)
MCHC: 32.6 g/dL (ref 31.0–37.0)
MCV: 94.4 FL (ref 74.0–97.0)
MPV: 11.1 FL (ref 9.2–11.8)
Monocytes %: 6 % (ref 3–10)
Monocytes Absolute: 0.5 10*3/uL (ref 0.05–1.2)
Neutrophils %: 73 % (ref 40–73)
Neutrophils Absolute: 6.5 10*3/uL (ref 1.8–8.0)
Platelets: 307 10*3/uL (ref 135–420)
RBC: 3.93 M/uL — ABNORMAL LOW (ref 4.20–5.30)
RDW: 17.4 % — ABNORMAL HIGH (ref 11.6–14.5)
WBC: 8.9 10*3/uL (ref 4.6–13.2)

## 2018-04-01 LAB — COMPREHENSIVE METABOLIC PANEL
ALT: 24 U/L (ref 13–56)
AST: 34 U/L (ref 10–38)
Albumin/Globulin Ratio: 0.9 (ref 0.8–1.7)
Albumin: 3.5 g/dL (ref 3.4–5.0)
Alkaline Phosphatase: 118 U/L — ABNORMAL HIGH (ref 45–117)
Anion Gap: 8 mmol/L (ref 3.0–18)
BUN: 9 MG/DL (ref 7.0–18)
Bun/Cre Ratio: 12 (ref 12–20)
CO2: 23 mmol/L (ref 21–32)
Calcium: 8.6 MG/DL (ref 8.5–10.1)
Chloride: 109 mmol/L (ref 100–111)
Creatinine: 0.73 MG/DL (ref 0.6–1.3)
EGFR IF NonAfrican American: >60 mL/min/{1.73_m2} (ref >60)
GFR African American: >60 mL/min/{1.73_m2} (ref >60)
Globulin: 3.8 g/dL (ref 2.0–4.0)
Glucose: 90 mg/dL (ref 74–99)
Potassium: 3.9 mmol/L (ref 3.5–5.5)
Sodium: 140 mmol/L (ref 136–145)
Total Bilirubin: 0.7 MG/DL (ref 0.2–1.0)
Total Protein: 7.3 g/dL (ref 6.4–8.2)

## 2018-04-01 LAB — LIPASE
Lipase: 446 U/L — ABNORMAL HIGH (ref 73–393)
Lipase: 446 U/L — ABNORMAL HIGH (ref 73–393)

## 2018-04-01 LAB — URINE MICROSCOPIC ONLY
RBC, UA: 0 /hpf (ref 0–5)
RBC: 0 /hpf (ref 0–5)

## 2018-04-01 LAB — HCG URINE, QL. - POC
HCG, Pregnancy, Urine, POC: NEGATIVE
Pregnancy test,urine (POC): NEGATIVE

## 2018-04-01 LAB — CBC WITH AUTOMATED DIFF
ABS. BASOPHILS: 0 10*3/uL (ref 0.0–0.1)
ABS. EOSINOPHILS: 0 10*3/uL (ref 0.0–0.4)
ABS. LYMPHOCYTES: 1.8 10*3/uL (ref 0.9–3.6)
ABS. MONOCYTES: 0.5 10*3/uL (ref 0.05–1.2)
ABS. NEUTROPHILS: 6.5 10*3/uL (ref 1.8–8.0)
BASOPHILS: 0 % (ref 0–2)
EOSINOPHILS: 1 % (ref 0–5)
HCT: 37.1 % (ref 35.0–45.0)
HGB: 12.1 g/dL (ref 12.0–16.0)
LYMPHOCYTES: 20 % — ABNORMAL LOW (ref 21–52)
MCH: 30.8 PG (ref 24.0–34.0)
MCHC: 32.6 g/dL (ref 31.0–37.0)
MCV: 94.4 FL (ref 74.0–97.0)
MONOCYTES: 6 % (ref 3–10)
MPV: 11.1 FL (ref 9.2–11.8)
NEUTROPHILS: 73 % (ref 40–73)
PLATELET: 307 10*3/uL (ref 135–420)
RBC: 3.93 M/uL — ABNORMAL LOW (ref 4.20–5.30)
RDW: 17.4 % — ABNORMAL HIGH (ref 11.6–14.5)
WBC: 8.9 10*3/uL (ref 4.6–13.2)

## 2018-04-01 LAB — METABOLIC PANEL, COMPREHENSIVE
A-G Ratio: 0.9 (ref 0.8–1.7)
ALT (SGPT): 24 U/L (ref 13–56)
AST (SGOT): 34 U/L (ref 10–38)
Albumin: 3.5 g/dL (ref 3.4–5.0)
Alk. phosphatase: 118 U/L — ABNORMAL HIGH (ref 45–117)
Anion gap: 8 mmol/L (ref 3.0–18)
BUN/Creatinine ratio: 12 (ref 12–20)
BUN: 9 MG/DL (ref 7.0–18)
Bilirubin, total: 0.7 MG/DL (ref 0.2–1.0)
CO2: 23 mmol/L (ref 21–32)
Calcium: 8.6 MG/DL (ref 8.5–10.1)
Chloride: 109 mmol/L (ref 100–111)
Creatinine: 0.73 MG/DL (ref 0.6–1.3)
GFR est AA: >60 mL/min/{1.73_m2} (ref >60)
GFR est non-AA: >60 mL/min/{1.73_m2} (ref >60)
Globulin: 3.8 g/dL (ref 2.0–4.0)
Glucose: 90 mg/dL (ref 74–99)
Potassium: 3.9 mmol/L (ref 3.5–5.5)
Protein, total: 7.3 g/dL (ref 6.4–8.2)
Sodium: 140 mmol/L (ref 136–145)

## 2018-04-01 MED ORDER — MORPHINE 2 MG/ML INJECTION
2 mg/mL | INTRAMUSCULAR | Status: AC
Start: 2018-04-01 — End: 2018-04-01
  Administered 2018-04-01: 09:00:00 via INTRAVENOUS

## 2018-04-01 MED ORDER — CEFTRIAXONE 1 GRAM SOLUTION FOR INJECTION
1 gram | INTRAMUSCULAR | Status: AC
Start: 2018-04-01 — End: 2018-04-01
  Administered 2018-04-01: 06:00:00 via INTRAVENOUS

## 2018-04-01 MED ORDER — DICYCLOMINE 10 MG CAP
10 mg | ORAL | Status: AC
Start: 2018-04-01 — End: 2018-04-01
  Administered 2018-04-01: 07:00:00 via ORAL

## 2018-04-01 MED ORDER — OXYCODONE-ACETAMINOPHEN 5 MG-325 MG TAB
5-325 mg | ORAL_TABLET | ORAL | 0 refills | Status: DC | PRN
Start: 2018-04-01 — End: 2018-04-12

## 2018-04-01 MED ORDER — SODIUM CHLORIDE 0.9% BOLUS IV
0.9 % | Freq: Once | INTRAVENOUS | Status: AC
Start: 2018-04-01 — End: 2018-04-01
  Administered 2018-04-01: 06:00:00 via INTRAVENOUS

## 2018-04-01 MED ORDER — IOPAMIDOL 61 % IV SOLN
61 % | Freq: Once | INTRAVENOUS | Status: AC
Start: 2018-04-01 — End: 2018-04-01
  Administered 2018-04-01: 06:00:00 via INTRAVENOUS

## 2018-04-01 MED ORDER — OXYCODONE-ACETAMINOPHEN 5 MG-325 MG TAB
5-325 mg | ORAL | Status: AC
Start: 2018-04-01 — End: 2018-04-01
  Administered 2018-04-01: 10:00:00 via ORAL

## 2018-04-01 MED ORDER — ONDANSETRON (PF) 4 MG/2 ML INJECTION
4 mg/2 mL | INTRAMUSCULAR | Status: AC
Start: 2018-04-01 — End: 2018-04-01
  Administered 2018-04-01: 06:00:00 via INTRAVENOUS

## 2018-04-01 MED ORDER — DICYCLOMINE 20 MG TAB
20 mg | ORAL_TABLET | Freq: Four times a day (QID) | ORAL | 0 refills | Status: DC | PRN
Start: 2018-04-01 — End: 2018-08-31

## 2018-04-01 MED ORDER — PANTOPRAZOLE 40 MG IV SOLR
40 mg | INTRAVENOUS | Status: AC
Start: 2018-04-01 — End: 2018-04-01
  Administered 2018-04-01: 06:00:00 via INTRAVENOUS

## 2018-04-01 MED ORDER — ONDANSETRON (PF) 4 MG/2 ML INJECTION
4 mg/2 mL | INTRAMUSCULAR | Status: AC
Start: 2018-04-01 — End: 2018-04-01
  Administered 2018-04-01: 09:00:00 via INTRAVENOUS

## 2018-04-01 MED ORDER — SODIUM CHLORIDE 0.9% BOLUS IV
0.9 % | Freq: Once | INTRAVENOUS | Status: AC
Start: 2018-04-01 — End: 2018-04-01
  Administered 2018-04-01: 10:00:00 via INTRAVENOUS

## 2018-04-01 MED ORDER — ONDANSETRON 4 MG TAB, RAPID DISSOLVE
4 mg | ORAL_TABLET | Freq: Three times a day (TID) | ORAL | 0 refills | Status: DC | PRN
Start: 2018-04-01 — End: 2018-08-31

## 2018-04-01 MED ORDER — CEPHALEXIN 500 MG CAP
500 mg | ORAL_CAPSULE | Freq: Four times a day (QID) | ORAL | 0 refills | Status: DC
Start: 2018-04-01 — End: 2018-04-12

## 2018-04-01 MED FILL — ONDANSETRON (PF) 4 MG/2 ML INJECTION: 4 mg/2 mL | INTRAMUSCULAR | Qty: 2

## 2018-04-01 MED FILL — ISOVUE-300  61 % INTRAVENOUS SOLUTION: 300 mg iodine /mL (61 %) | INTRAVENOUS | Qty: 100

## 2018-04-01 MED FILL — PROTONIX 40 MG INTRAVENOUS SOLUTION: 40 mg | INTRAVENOUS | Qty: 40

## 2018-04-01 MED FILL — SODIUM CHLORIDE 0.9 % IV: INTRAVENOUS | Qty: 1000

## 2018-04-01 MED FILL — DICYCLOMINE 10 MG CAP: 10 mg | ORAL | Qty: 2

## 2018-04-01 MED FILL — OXYCODONE-ACETAMINOPHEN 5 MG-325 MG TAB: 5-325 mg | ORAL | Qty: 1

## 2018-04-01 MED FILL — CEFTRIAXONE 1 GRAM SOLUTION FOR INJECTION: 1 gram | INTRAMUSCULAR | Qty: 1

## 2018-04-01 MED FILL — MORPHINE 2 MG/ML INJECTION: 2 mg/mL | INTRAMUSCULAR | Qty: 2

## 2018-04-01 NOTE — ED Notes (Signed)
Pt tearful, states no relief of pain. Explained there are some limitations in medications we can give her as she is here on her on. Pt states she is walking not driving. Explained that with some medications walking is still potentially hazardous. Dr Edilia Bo came to room to talk with pt.

## 2018-04-01 NOTE — ED Notes (Signed)
Pt given prescriptions with verbal medication information prescribed  Pt given verbal and written d/c instructions. pt ambulated from ED in NAD drank 3 oz of water and tolerated well. States pain 6/10 at present

## 2018-04-01 NOTE — ED Notes (Signed)
Pt tearful, states no relief of pain. Explained there are some limitations in medications we can give her as she is here on her on. Pt states she is walking not driving. Explained that with some medications walking is still potentially hazardous. Dr Dickson came to room to talk with pt.

## 2018-04-01 NOTE — ED Notes (Signed)
Pt given prescriptions with verbal medication information prescribed  Pt given verbal and written d/c instructions. pt ambulated from ED in NAD drank 3 oz of water and tolerated well. States pain 6/10 at present

## 2018-04-01 NOTE — Other (Cosign Needed)
2:47 PM  04/03/2018    On keflex for UTI. Sensitive per C&S. On appropriate tx.     Cassandria Anger, PA-C

## 2018-04-03 ENCOUNTER — Emergency Department: Admit: 2018-04-04 | Payer: MEDICAID | Primary: Family Medicine

## 2018-04-03 ENCOUNTER — Inpatient Hospital Stay
Admit: 2018-04-03 | Discharge: 2018-04-12 | Disposition: A | Discharge status: Home / Self Care | Payer: MEDICAID | Attending: Internal Medicine | Admitting: Internal Medicine

## 2018-04-03 DIAGNOSIS — K265 Chronic or unspecified duodenal ulcer with perforation: Secondary | ICD-10-CM

## 2018-04-03 LAB — CULTURE, URINE
Culture result:: 20000
Culture result:: 30000 — AB
Culture: 20000

## 2018-04-03 NOTE — ED Provider Notes (Signed)
ED Provider Notes by Domingo Dimes, PA at 04/03/18 1941                Author: Domingo Dimes, PA  Service: EMERGENCY  Author Type: Physician Assistant       Filed: 04/04/18 0407  Date of Service: 04/03/18 1941  Status: Attested           Editor: Amethyst Gainer, Kerry Kass, PA (Physician Assistant)  Cosigner: Sharlotte Alamo, MD at 04/05/18 1003          Attestation signed by Sharlotte Alamo, MD at 04/05/18 1003          I, Sharlotte Alamo, MD , have personally seen and examined this patient; I have fully participated in the care of this patient with the advanced practice provider.   I have reviewed and agree with all pertinent clinical information including history, physical exam, studies and the plan.  I have also reviewed and agree with the medications, allergies and past medical history sections for this patient.         Sharlotte Alamo, MD   April 05, 2018                                  [place]   Emergency Department Treatment Report          Patient: Ashley [name]  Age: 33 y.o.  Sex: female          Date of Birth: 21-Mar-1985  Admit Date: 04/03/2018  PCP: Roosevelt Locks Da, MD     MRN: [id number]   CSN: [id number]   Attending: Sharlotte Alamo, MD         Room: ER07/ER07  Time Dictated: 7:41 PM  APP: Domingo Dimes, PA        I hereby certify this patient for admission based upon medical necessity as ??   noted below:      Chief Complaint    Abdominal Pain        History of Present Illness     33 y.o. female  with recent dx of acute pancreatitis presenting with abdominal pain that started 3 days ago.  The pain is described as achy, squeezing, with radiation to her back.  Patient states she went to a different ER where she was found to have pancreatitis 3  days ago.  Patient states she was offered to stay inpatient, however she desired to go home at that time and manage her pain at home.  Patient states she has been taking Keflex as prescribed.  She endorses associated  mucus-like diarrhea with no blood,  and subjective fever.  She also reports she has not been able to keep anything down, without feeling nauseous. She has not been vomiting, however she has had dry heaves. She reports she has taken motrin with no relief of her symptoms. She denies chest  pain, shortness of breath, urinary symptoms, lightheadedness, or any other symptoms.        Review of Systems     Constitutional: Subjective fever. No chills   ENT: No sore throat or runny nose.   Respiratory: No cough or shortness of breath    C[employer]: No chest pain or palpitations   Gastrointestinal: + abdominal pain, nausea   Genitourinary: No dysuria or frequency.   Musculoskeletal: No joint redness or swelling   Integumentary: No rash   Hematologic: No bleeding/bruising complaints   Neurological:  No headache or dizziness.   Denies complaints in all other systems.     Past Medical/Surgical History          Past Medical History:        Diagnosis  Date         ?  Ill-defined condition            bronchitis         ?  Pneumonia            Past Surgical History:         Procedure  Laterality  Date          ?  HX CESAREAN SECTION    2002, 2004, 2008          x 3          ?  HX CESAREAN SECTION         ?  HX GASTRIC BYPASS    [place]          ?  HX GASTRIC BYPASS              ?  HX GASTRIC BYPASS                 Social History          Social History          Socioeconomic History         ?  Marital status:  SINGLE              Spouse name:  Not on file         ?  Number of children:  Not on file     ?  Years of education:  Not on file     ?  Highest education level:  Not on file       Tobacco Use         ?  Smoking status:  Former Smoker              Types:  Cigarettes         Last attempt to quit:  11/12/2009         Years since quitting:  8.3         ?  Smokeless tobacco:  Never Used       Substance and Sexual Activity         ?  Alcohol use:  Not Currently              Alcohol/week:  0.0 - 4.0 standard  drinks             Comment: occassional         ?  Drug use:  No     ?  Sexual activity:  Yes              Partners:  Male         Birth control/protection:  None       Social History Narrative          ** Merged History Encounter **                        Family History          Family History         Problem  Relation  Age of Onset          ?  Diabetes  Mother       ?  Hypertension  Father       ?  Heart Disease  Maternal Grandmother       ?  Heart Attack  Maternal Grandmother       ?  High Cholesterol  Maternal Grandmother       ?  Arthritis-osteo  Maternal Grandmother       ?  Stroke  Paternal Aunt            ?  Kidney Disease  Maternal Aunt               Current Medications          Prior to Admission Medications     Prescriptions  Last Dose  Informant  Patient Reported?  Taking?      acetaminophen (TYLENOL) 325 mg tablet      No  No      Sig: Take 2 Tabs by mouth every six (6) hours as needed for Pain.      cephALEXin (KEFLEX) 500 mg capsule      No  No      Sig: Take 1 Cap by mouth four (4) times daily for 10 days.      cyanocobalamin (B-12 DOTS) 500 mcg tablet      Yes  No      Sig: Take 500 mcg by mouth daily.        dicyclomine (BENTYL) 20 mg tablet      No  No      Sig: Take 1 Tab by mouth every six (6) hours as needed (abdominal cramps) for up to 20 doses.      lidocaine (LIDODERM) 5 %      No  No      Sig: Apply patch to the affected area for 12 hours a day and remove for 12 hours a day.      methocarbamol (ROBAXIN-750) 750 mg tablet      No  No      Sig: Take 1 Tab by mouth four (4) times daily.      ondansetron (ZOFRAN ODT) 4 mg disintegrating tablet      No  No      Sig: Take 1 Tab by mouth every eight (8) hours as needed for Nausea.      oxyCODONE-acetaminophen (PERCOCET) 5-325 mg per tablet      No  No      Sig: Take 1 Tab by mouth every four (4) hours as needed for Pain for up to 7 days. Max Daily Amount: 6 Tabs.               Facility-Administered Medications: None             Allergies           Allergies        Allergen  Reactions         ?  Aspirin  Other (comments)             Stomach ulcer         ?  Ibuprofen  Unknown (comments)             Gastric Bypass--advised to avoid         ?  Nsaids (Non-Steroidal Anti-Inflammatory Drug)  Other (comments)             Hx gastric bypass  Physical Exam        Visit Vitals      BP  109/64 (BP 1 Location: Right arm, BP Patient Position: Supine)     Pulse  91     Temp  98.6 ??F (37 ??C)     Resp  18     Ht  5' (1.524 m)     Wt  88.9 kg (196 lb)     LMP  04/01/2018 (Exact Date)     SpO2  100%        BMI  38.28 kg/m??        Constitutional: Patient appears well developed and well nourished.  Patient sitting in exam bed, tearful in pain. Appearance and behavior are  age and situation appropriate.   HEENT: Conjunctiva clear.  PERRLA. Mucous membranes moist, non-erythematous. Surface of the pharynx, palate, and tongue are pink, moist and  without lesions.   Neck: supple, non tender, symmetrical, no masses or JVD.    Respiratory: lungs clear to auscultation, nonlabored respirations. No tachypnea or accessory muscle use.   Cardiovascular: heart regular rate and rhythm without murmur rubs or gallops.      Gastrointestinal: Abdomen is soft, non-distended. RUQ tenderness. No rebound, guarding, or peritonitis. No hepatomegaly or splenomegaly. No  abdominal masses appreciated by inspection or palpation. Bilaterally no CVA tenderness.   Musculoskeletal: Calves soft and non-tender. No peripheral edema or significant variscosities.   Pulses:  Distal pulses 2+ and equal bilaterally.    Integumentary: warm and dry without rash   Neurologic: alert and oriented, no focal weakness .         Impression and Management Plan     33 y.o. female  presenting with right upper quadrant abdominal pain.  I have reviewed her records, she was recently diagnosed with pancreatitis. Patient is still moderately tender or exam. Will obtain belly labs, provide pain control, reassess.  Will  consider repeating  CT abdomen, to r/o worsening  The patient presents with abdominal pain.  Will assess for comorbid complications that may be contributing to symptoms.  Appropriate diagnostic studies will be obtained and symptomatic treatment given.         Diagnostic Studies     Lab:      Recent Results (from the past 12 hour(s))     CBC WITH AUTOMATED DIFF          Collection Time: 04/03/18  6:50 PM         Result  Value  Ref Range            WBC  10.3  4.0 - 11.0 1000/mm3       RBC  3.41 (L)  3.60 - 5.20 M/uL       HGB  10.8 (L)  13.0 - 17.2 gm/dl       HCT  32.9 (L)  37.0 - 50.0 %       MCV  96.5  80.0 - 98.0 fL       MCH  31.7  25.4 - 34.6 pg       MCHC  32.8  30.0 - 36.0 gm/dl       PLATELET  275  140 - 450 1000/mm3       MPV  11.7 (H)  6.0 - 10.0 fL       RDW-SD  58.4 (H)  36.4 - 46.3         NRBC  0  0 - 0  IMMATURE GRANULOCYTES  0.5  0.0 - 3.0 %       NEUTROPHILS  79.3 (H)  34 - 64 %       LYMPHOCYTES  12.4 (L)  28 - 48 %       MONOCYTES  6.9  1 - 13 %       EOSINOPHILS  0.7  0 - 5 %       BASOPHILS  0.2  0 - 3 %       LIPASE          Collection Time: 04/03/18  6:50 PM         Result  Value  Ref Range            Lipase  43 (L)  73 - 393 U/L       METABOLIC PANEL, COMPREHENSIVE          Collection Time: 04/03/18  6:50 PM         Result  Value  Ref Range            Sodium  138  136 - 145 mEq/L       Potassium  3.6  3.5 - 5.1 mEq/L       Chloride  107  98 - 107 mEq/L       CO2  26  21 - 32 mEq/L       Glucose  88  74 - 106 mg/dl       BUN  4 (L)  7 - 25 mg/dl       Creatinine  0.6  0.6 - 1.3 mg/dl       GFR est AA  >60.0          GFR est non-AA  >60          Calcium  8.9  8.5 - 10.1 mg/dl       AST (SGOT)  18  15 - 37 U/L       ALT (SGPT)  15  12 - 78 U/L       Alk. phosphatase  129 (H)  45 - 117 U/L       Bilirubin, total  0.5  0.2 - 1.0 mg/dl       Protein, total  7.4  6.4 - 8.2 gm/dl       Albumin  2.8 (L)  3.4 - 5.0 gm/dl       Anion gap  5  5 - 15 mmol/L       POC URINE MACROSCOPIC           Collection Time: 04/03/18  6:59 PM         Result  Value  Ref Range            Glucose  Negative  NEGATIVE,Negative mg/dl       Bilirubin  Negative  NEGATIVE,Negative         Ketone  Trace (A)  NEGATIVE,Negative mg/dl       Specific gravity  1.020  1.005 - 1.030         Blood  Trace-lysed (A)  NEGATIVE,Negative         pH (UA)  6.0  5 - 9         Protein  Negative  NEGATIVE,Negative mg/dl       Urobilinogen  0.2  0.0 - 1.0 EU/dl       Nitrites  Negative  NEGATIVE,Negative  Leukocyte Esterase  Negative  NEGATIVE,Negative         Color  Yellow          Appearance  Clear          POC HCG,URINE          Collection Time: 04/03/18  7:02 PM         Result  Value  Ref Range            HCG urine, QL  negative  NEGATIVE,Negative,negative          Imaging:     Ct Abd Pelv W Cont      Result Date: 04/03/2018   CT of the abdomen and pelvis INDICATION:  RUQ pain.    COMPARISON: No relevant studies. TECHNIQUE: CT of the abdomen and pelvis was performed with nonionic intravenous contrast with coronal and sagittal reformatted images. DICOM format image data is available  to non-affiliated external healthcare facilities or entities on a secure, media free, reciprocally searchable basis with patient authorization for 12 months following the date of the study. FINDINGS: LOWER THORAX: There are bilateral lower lobe infiltrates,  left greater than right. Additional areas of subsegmental atelectasis. ABDOMEN: There is no free air or free fluid. There is a slightly ill-defined 2.8 cm hypoenhancing lesion within the left medial hepatic lobe adjacent to the falciform ligament for  which MRI of the abdomen with and without contrast is suggested. There is fatty infiltration of the liver. There are no radiopaque gallstones. The adrenal glands are unremarkable. There is no hydronephrosis or nephrolithiasis. GI TRACT: Postsurgical changes  related to gastric bypass. There is thickening of the horizontal portion of the duodenum with  surrounding free fluid and inflammatory change. This is inferior to the uncinate process of the pancreas and inflammation likely secondary to duodenal inflammation.  Lipase normal on chart review. No definite free air. No dilated loops of bowel.     PELVIS: There is no focal bladder wall thickening. The uterus is present. Negative for aortic dissection. OSSEOUS STRUCTURES: Acutely intact.       IMPRESSION: 1.  Thickening of the horizontal portion of duodenum with surrounding inflammatory changes. Findings likely reflect duodenitis. Superimposed ulcer not excluded. While inflammation is near the pancreas, lipase is normal in chart review. 2.   Fatty infiltration of the liver. 2.8 cm indeterminate lesion within the left medial hepatic lobe. MRI with and without contrast recommended. 3.  Bibasilar infiltrates/atelectasis.            ED Course/MDM     Patient remained clinically stable throughout the Emergency Department visit.  Vitals were unremarkable and the patient's condition required no further ED intervention.  Patient  labs generally unremarkable.  Her lipase is actually decreased from before at 43.  She has no evidence of UTI.  Her white count is not increased, she does have stable anemia.   Patient CT abdomen are remarkable for thickening of horizontal portion of  duodenum with inflammatory changes.  Patient also has fatty infiltration of liver 2.8 cm and bibasilar atelectasis, per radiologist reading.   Her pain has been moderately controlled during her stay in the ER. I have discussed with patient her lab results, and turned that she would be admitted to the hospital.  Patient is agreeable with assessment and plan and has no further questions.        Medications       cyanocobalamin (VITAMIN B12) tablet 500 mcg (has no administration in time range)     .  PHARMACY TO SUBSTITUTE PER PROTOCOL (Reordered from: dicyclomine (BENTYL) 20 mg tablet) (has no administration in time range)     acetaminophen (TYLENOL)  tablet 650 mg (has no administration in time range)     sodium chloride (NS) flush 5-10 mL ( IntraVENous Canceled Entry 04/04/18 0600)     sodium chloride (NS) flush 5-10 mL (has no administration in time range)     naloxone [facility]) injection 0.1 mg (has no administration in time range)       morphine injection 2 mg (2 mg IntraVENous Given 04/04/18 0151)       0.9% sodium chloride infusion (100 mL/hr IntraVENous New Bag 04/04/18 0151)     ondansetron (ZOFRAN) injection 4 mg (has no administration in time range)     pantoprazole (PROTONIX) 40 mg in 0.9% sodium chloride 10 mL injection (40 mg IntraVENous Given 04/04/18 0309)     morphine injection 4 mg (4 mg IntraVENous Given 04/03/18 2002)     sodium chloride 0.9 % bolus infusion 1,000 mL (0 mL IntraVENous IV Completed 04/03/18 2335)     ondansetron (ZOFRAN) injection 4 mg (4 mg IntraVENous Given 04/03/18 2000)     iopamidoL (ISOVUE 300) 61 % contrast injection 85 mL (85 mL IntraVENous Given 04/03/18 2206)       pantoprazole (PROTONIX) 40 mg in 0.9% sodium chloride 10 mL injection (40 mg IntraVENous Given 04/03/18 2324)             ED Course as of Apr 04 398       Tue Apr 03, 2018        2320  Patient CT abdomen remarkable for suspected duodenitis, but superimposed ulcer not ruled out, per radiologist reading     [DB]       Wed Apr 04, 2018        0109  Consult placed to Dr. Charisse March who has kindly agreed to admit the patient to his service.      [DB]              ED Course User Index   [DB] Keyanni Whittinghill N, PA                Final Diagnosis                 ICD-10-CM  ICD-9-CM          1.  RUQ abdominal pain  R10.11  789.01          2.  Gastritis and duodenitis  K29.90  535.50             Disposition     Admission.      Christorpher Hisaw, PA-C   April 04, 2018         The patient was personally evaluated by myself and Dr. Sharlotte Alamo, MD  who agrees with the above assessment and plan.      My signature above authenticates this document and my orders, the final      diagnosis (es), discharge prescription (s), and instructions in the Epic     record.   If you have any questions please contact [phone].       Nursing notes have been reviewed by the physician/ advanced practice     Clinician.

## 2018-04-03 NOTE — ED Notes (Signed)
Dx with pancreatitis a few days ago at another hosp. Sent home on antibiotics and pain medications but pain is becoming worse

## 2018-04-03 NOTE — ED Triage Notes (Signed)
Dx with pancreatitis a few days ago at another hosp. Sent home on antibiotics and pain medications but pain is becoming worse

## 2018-04-03 NOTE — ED Provider Notes (Signed)
[facility]  Emergency Department Treatment Report    Patient: Ashley [name] Age: 33 y.o. Sex: female    Date of Birth: 07/22/1985 Admit Date: 04/03/2018 PCP: Roosevelt Locks Da, MD   MRN: [id number]  CSN: [id number]  Attending: Sharlotte Alamo, MD   Room: ER07/ER07 Time Dictated: 7:41 PM APP: Domingo Dimes, PA     I hereby certify this patient for admission based upon medical necessity as ??  noted below:    Chief Complaint   Abdominal Pain    History of Present Illness   33 y.o. female with recent dx of acute pancreatitis presenting with abdominal pain that started 3 days ago.  The pain is described as achy, squeezing, with radiation to her back.  Patient states she went to a different ER where she was found to have pancreatitis 3 days ago.  Patient states she was offered to stay inpatient, however she desired to go home at that time and manage her pain at home.  Patient states she has been taking Keflex as prescribed.  She endorses associated mucus-like diarrhea with no blood, and subjective fever.  She also reports she has not been able to keep anything down, without feeling nauseous. She has not been vomiting, however she has had dry heaves. She reports she has taken motrin with no relief of her symptoms. She denies chest pain, shortness of breath, urinary symptoms, lightheadedness, or any other symptoms.    Review of Systems   Constitutional: Subjective fever. No chills  ENT: No sore throat or runny nose.  Respiratory: No cough or shortness of breath   Cardoivascular: No chest pain or palpitations  Gastrointestinal: + abdominal pain, nausea  Genitourinary: No dysuria or frequency.  Musculoskeletal: No joint redness or swelling  Integumentary: No rash  Hematologic: No bleeding/bruising complaints  Neurological: No headache or dizziness.  Denies complaints in all other systems.  Past Medical/Surgical History     Past Medical History:   Diagnosis Date   ??? Ill-defined condition     bronchitis    ??? Pneumonia      Past Surgical History:   Procedure Laterality Date   ??? HX CESAREAN SECTION  2002, 2004, 2008    x 3   ??? HX CESAREAN SECTION     ??? 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 HX GASTRIC BYPASS     ??? HX GASTRIC BYPASS         Social History     Social History     Socioeconomic History   ??? Marital status: SINGLE     Spouse name: Not on file   ??? Number of children: Not on file   ??? Years of education: Not on file   ??? Highest education level: Not on file   Tobacco Use   ??? Smoking status: Former Smoker     Types: Cigarettes     Last attempt to quit: 11/12/2009     Years since quitting: 8.3   ??? Smokeless tobacco: Never Used   Substance and Sexual Activity   ??? Alcohol use: Not Currently     Alcohol/week: 0.0 - 4.0 standard drinks     Comment: occassional   ??? Drug use: No   ??? Sexual activity: Yes     Partners: Male     Birth control/protection: None   Social History Narrative    ** Merged History Encounter **  Family History     Family History   Problem Relation Age of Onset   ??? Diabetes Mother    ??? Hypertension Father    ??? Heart Disease Maternal Grandmother    ??? Heart Attack Maternal Grandmother    ??? High Cholesterol Maternal Grandmother    ??? Arthritis-osteo Maternal Grandmother    ??? Stroke Paternal Aunt    ??? Kidney Disease Maternal Aunt        Current Medications     Prior to Admission Medications   Prescriptions Last Dose Informant Patient Reported? Taking?   acetaminophen (TYLENOL) 325 mg tablet   No No   Sig: Take 2 Tabs by mouth every six (6) hours as needed for Pain.   cephALEXin (KEFLEX) 500 mg capsule   No No   Sig: Take 1 Cap by mouth four (4) times daily for 10 days.   cyanocobalamin (B-12 DOTS) 500 mcg tablet   Yes No   Sig: Take 500 mcg by mouth daily.     dicyclomine (BENTYL) 20 mg tablet   No No   Sig: Take 1 Tab by mouth every six (6) hours as needed (abdominal cramps) for up to 20 doses.   lidocaine (LIDODERM) 5 %   No No    Sig: Apply patch to the affected area for 12 hours a day and remove for 12 hours a day.   methocarbamol (ROBAXIN-750) 750 mg tablet   No No   Sig: Take 1 Tab by mouth four (4) times daily.   ondansetron (ZOFRAN ODT) 4 mg disintegrating tablet   No No   Sig: Take 1 Tab by mouth every eight (8) hours as needed for Nausea.   oxyCODONE-acetaminophen (PERCOCET) 5-325 mg per tablet   No No   Sig: Take 1 Tab by mouth every four (4) hours as needed for Pain for up to 7 days. Max Daily Amount: 6 Tabs.      Facility-Administered Medications: None       Allergies     Allergies   Allergen Reactions   ??? Aspirin Other (comments)     Stomach ulcer   ??? Ibuprofen Unknown (comments)     Gastric Bypass--advised to avoid   ??? Nsaids (Non-Steroidal Anti-Inflammatory Drug) Other (comments)     Hx gastric bypass       Physical Exam     Visit Vitals  BP 109/64 (BP 1 Location: Right arm, BP Patient Position: Supine)   Pulse 91   Temp 98.6 ??F (37 ??C)   Resp 18   Ht 5' (1.524 m)   Wt 88.9 kg (196 lb)   LMP 04/01/2018 (Exact Date)   SpO2 100%   BMI 38.28 kg/m??     Constitutional: Patient appears well developed and well nourished.  Patient sitting in exam bed, tearful in pain. Appearance and behavior are age and situation appropriate.  HEENT: Conjunctiva clear.  PERRLA. Mucous membranes moist, non-erythematous. Surface of the pharynx, palate, and tongue are pink, moist and without lesions.  Neck: supple, non tender, symmetrical, no masses or JVD.   Respiratory: lungs clear to auscultation, nonlabored respirations. No tachypnea or accessory muscle use.  Cardiovascular: heart regular rate and rhythm without murmur rubs or gallops.     Gastrointestinal: Abdomen is soft, non-distended. RUQ tenderness. No rebound, guarding, or peritonitis. No hepatomegaly or splenomegaly. No abdominal masses appreciated by inspection or palpation. Bilaterally no CVA tenderness.  Musculoskeletal: Calves soft and non-tender. No peripheral edema or  significant variscosities.  Pulses:  Distal pulses 2+ and equal bilaterally.   Integumentary: warm and dry without rash  Neurologic: alert and oriented, no focal weakness .     Impression and Management Plan   33 y.o. female presenting with right upper quadrant abdominal pain.  I have reviewed her records, she was recently diagnosed with pancreatitis. Patient is still moderately tender or exam. Will obtain belly labs, provide pain control, reassess.  Will consider repeating CT abdomen, to r/o worsening  The patient presents with abdominal pain.  Will assess for comorbid complications that may be contributing to symptoms.  Appropriate diagnostic studies will be obtained and symptomatic treatment given.     Diagnostic Studies   Lab:   Recent Results (from the past 12 hour(s))   CBC WITH AUTOMATED DIFF    Collection Time: 04/03/18  6:50 PM   Result Value Ref Range    WBC 10.3 4.0 - 11.0 1000/mm3    RBC 3.41 (L) 3.60 - 5.20 M/uL    HGB 10.8 (L) 13.0 - 17.2 gm/dl    HCT 32.9 (L) 37.0 - 50.0 %    MCV 96.5 80.0 - 98.0 fL    MCH 31.7 25.4 - 34.6 pg    MCHC 32.8 30.0 - 36.0 gm/dl    PLATELET 275 140 - 450 1000/mm3    MPV 11.7 (H) 6.0 - 10.0 fL    RDW-SD 58.4 (H) 36.4 - 46.3      NRBC 0 0 - 0      IMMATURE GRANULOCYTES 0.5 0.0 - 3.0 %    NEUTROPHILS 79.3 (H) 34 - 64 %    LYMPHOCYTES 12.4 (L) 28 - 48 %    MONOCYTES 6.9 1 - 13 %    EOSINOPHILS 0.7 0 - 5 %    BASOPHILS 0.2 0 - 3 %   LIPASE    Collection Time: 04/03/18  6:50 PM   Result Value Ref Range    Lipase 43 (L) 73 - 474 U/L   METABOLIC PANEL, COMPREHENSIVE    Collection Time: 04/03/18  6:50 PM   Result Value Ref Range    Sodium 138 136 - 145 mEq/L    Potassium 3.6 3.5 - 5.1 mEq/L    Chloride 107 98 - 107 mEq/L    CO2 26 21 - 32 mEq/L    Glucose 88 74 - 106 mg/dl    BUN 4 (L) 7 - 25 mg/dl    Creatinine 0.6 0.6 - 1.3 mg/dl    GFR est AA >60.0      GFR est non-AA >60      Calcium 8.9 8.5 - 10.1 mg/dl    AST (SGOT) 18 15 - 37 U/L    ALT (SGPT) 15 12 - 78 U/L     Alk. phosphatase 129 (H) 45 - 117 U/L    Bilirubin, total 0.5 0.2 - 1.0 mg/dl    Protein, total 7.4 6.4 - 8.2 gm/dl    Albumin 2.8 (L) 3.4 - 5.0 gm/dl    Anion gap 5 5 - 15 mmol/L   POC URINE MACROSCOPIC    Collection Time: 04/03/18  6:59 PM   Result Value Ref Range    Glucose Negative NEGATIVE,Negative mg/dl    Bilirubin Negative NEGATIVE,Negative      Ketone Trace (A) NEGATIVE,Negative mg/dl    Specific gravity 1.020 1.005 - 1.030      Blood Trace-lysed (A) NEGATIVE,Negative      pH (UA) 6.0 5 - 9      Protein  Negative NEGATIVE,Negative mg/dl    Urobilinogen 0.2 0.0 - 1.0 EU/dl    Nitrites Negative NEGATIVE,Negative      Leukocyte Esterase Negative NEGATIVE,Negative      Color Yellow      Appearance Clear     POC HCG,URINE    Collection Time: 04/03/18  7:02 PM   Result Value Ref Range    HCG urine, QL negative NEGATIVE,Negative,negative       Imaging:    Ct Abd Pelv W Cont    Result Date: 04/03/2018  CT of the abdomen and pelvis INDICATION:  RUQ pain.    COMPARISON: No relevant studies. TECHNIQUE: CT of the abdomen and pelvis was performed with nonionic intravenous contrast with coronal and sagittal reformatted images. DICOM format image data is available to non-affiliated external healthcare facilities or entities on a secure, media free, reciprocally searchable basis with patient authorization for 12 months following the date of the study. FINDINGS: LOWER THORAX: There are bilateral lower lobe infiltrates, left greater than right. Additional areas of subsegmental atelectasis. ABDOMEN: There is no free air or free fluid. There is a slightly ill-defined 2.8 cm hypoenhancing lesion within the left medial hepatic lobe adjacent to the falciform ligament for which MRI of the abdomen with and without contrast is suggested. There is fatty infiltration of the liver. There are no radiopaque gallstones. The adrenal glands are unremarkable. There is no hydronephrosis or nephrolithiasis. GI  TRACT: Postsurgical changes related to gastric bypass. There is thickening of the horizontal portion of the duodenum with surrounding free fluid and inflammatory change. This is inferior to the uncinate process of the pancreas and inflammation likely secondary to duodenal inflammation. Lipase normal on chart review. No definite free air. No dilated loops of bowel.     PELVIS: There is no focal bladder wall thickening. The uterus is present. Negative for aortic dissection. OSSEOUS STRUCTURES: Acutely intact.     IMPRESSION: 1.  Thickening of the horizontal portion of duodenum with surrounding inflammatory changes. Findings likely reflect duodenitis. Superimposed ulcer not excluded. While inflammation is near the pancreas, lipase is normal in chart review. 2.  Fatty infiltration of the liver. 2.8 cm indeterminate lesion within the left medial hepatic lobe. MRI with and without contrast recommended. 3.  Bibasilar infiltrates/atelectasis.       ED Course/MDM   Patient remained clinically stable throughout the Emergency Department visit.  Vitals were unremarkable and the patient's condition required no further ED intervention.  Patient labs generally unremarkable.  Her lipase is actually decreased from before at 43.  She has no evidence of UTI.  Her white count is not increased, she does have stable anemia.   Patient CT abdomen are remarkable for thickening of horizontal portion of duodenum with inflammatory changes.  Patient also has fatty infiltration of liver 2.8 cm and bibasilar atelectasis, per radiologist reading.  Her pain has been moderately controlled during her stay in the ER. I have discussed with patient her lab results, and turned that she would be admitted to the hospital.  Patient is agreeable with assessment and plan and has no further questions.    Medications   cyanocobalamin (VITAMIN B12) tablet 500 mcg (has no administration in time range)    .PHARMACY TO SUBSTITUTE PER PROTOCOL (Reordered from: dicyclomine (BENTYL) 20 mg tablet) (has no administration in time range)   acetaminophen (TYLENOL) tablet 650 mg (has no administration in time range)   sodium chloride (NS) flush 5-10 mL ( IntraVENous Canceled Entry 04/04/18 0600)  sodium chloride (NS) flush 5-10 mL (has no administration in time range)   naloxone [facility]) injection 0.1 mg (has no administration in time range)   morphine injection 2 mg (2 mg IntraVENous Given 04/04/18 0151)   0.9% sodium chloride infusion (100 mL/hr IntraVENous New Bag 04/04/18 0151)   ondansetron (ZOFRAN) injection 4 mg (has no administration in time range)   pantoprazole (PROTONIX) 40 mg in 0.9% sodium chloride 10 mL injection (40 mg IntraVENous Given 04/04/18 0309)   morphine injection 4 mg (4 mg IntraVENous Given 04/03/18 2002)   sodium chloride 0.9 % bolus infusion 1,000 mL (0 mL IntraVENous IV Completed 04/03/18 2335)   ondansetron (ZOFRAN) injection 4 mg (4 mg IntraVENous Given 04/03/18 2000)   iopamidoL (ISOVUE 300) 61 % contrast injection 85 mL (85 mL IntraVENous Given 04/03/18 2206)   pantoprazole (PROTONIX) 40 mg in 0.9% sodium chloride 10 mL injection (40 mg IntraVENous Given 04/03/18 2324)       ED Course as of Apr 04 398   Tue Apr 03, 2018   2320 Patient CT abdomen remarkable for suspected duodenitis, but superimposed ulcer not ruled out, per radiologist reading    [DB]   Wed Apr 04, 2018   0109 Consult placed to Dr. Charisse March who has kindly agreed to admit the patient to his service.     [DB]      ED Course User Index  [DB] Menashe Kafer N, PA         Final Diagnosis       ICD-10-CM ICD-9-CM   1. RUQ abdominal pain R10.11 789.01   2. Gastritis and duodenitis K29.90 535.50       Disposition   Admission.    Fabion Gatson, PA-C  April 04, 2018      The patient was personally evaluated by myself and Dr. Sharlotte Alamo, MD  who agrees with the above assessment and plan.     My signature above authenticates this document and my orders, the final    diagnosis (es), discharge prescription (s), and instructions in the Epic    record.  If you have any questions please contact [phone].     Nursing notes have been reviewed by the physician/ advanced practice    Clinician.

## 2018-04-04 LAB — CBC WITH AUTO DIFFERENTIAL
Basophils %: 0.2 % (ref 0–3)
Eosinophils %: 0.7 % (ref 0–5)
Hematocrit: 32.9 % — ABNORMAL LOW (ref 37.0–50.0)
Hemoglobin: 10.8 gm/dl — ABNORMAL LOW (ref 13.0–17.2)
Immature Granulocytes: 0.5 % (ref 0.0–3.0)
Lymphocytes %: 12.4 % — ABNORMAL LOW (ref 28–48)
MCH: 31.7 pg (ref 25.4–34.6)
MCHC: 32.8 gm/dl (ref 30.0–36.0)
MCV: 96.5 fL (ref 80.0–98.0)
MPV: 11.7 fL — ABNORMAL HIGH (ref 6.0–10.0)
Monocytes %: 6.9 % (ref 1–13)
Neutrophils %: 79.3 % — ABNORMAL HIGH (ref 34–64)
Nucleated RBCs: 0 (ref 0–0)
Platelets: 275 10*3/uL (ref 140–450)
RBC: 3.41 M/uL — ABNORMAL LOW (ref 3.60–5.20)
RDW-SD: 58.4 — ABNORMAL HIGH (ref 36.4–46.3)
WBC: 10.3 10*3/uL (ref 4.0–11.0)

## 2018-04-04 LAB — COMPREHENSIVE METABOLIC PANEL
ALT: 15 U/L (ref 12–78)
AST: 18 U/L (ref 15–37)
Albumin: 2.8 gm/dl — ABNORMAL LOW (ref 3.4–5.0)
Alkaline Phosphatase: 129 U/L — ABNORMAL HIGH (ref 45–117)
Anion Gap: 5 mmol/L (ref 5–15)
BUN: 4 mg/dl — ABNORMAL LOW (ref 7–25)
CO2: 26 mEq/L (ref 21–32)
Calcium: 8.9 mg/dl (ref 8.5–10.1)
Chloride: 107 mEq/L (ref 98–107)
Creatinine: 0.6 mg/dl (ref 0.6–1.3)
EGFR IF NonAfrican American: >60
GFR African American: >60
Glucose: 88 mg/dl (ref 74–106)
Potassium: 3.6 mEq/L (ref 3.5–5.1)
Sodium: 138 mEq/L (ref 136–145)
Total Bilirubin: 0.5 mg/dl (ref 0.2–1.0)
Total Protein: 7.4 gm/dl (ref 6.4–8.2)

## 2018-04-04 LAB — POC URINE MACROSCOPIC
Bilirubin, Urine: NEGATIVE
Bilirubin: NEGATIVE
Glucose, Ur: NEGATIVE mg/dl
Glucose: NEGATIVE mg/dl
Leukocyte Esterase, Urine: NEGATIVE
Leukocyte Esterase: NEGATIVE
Nitrite, Urine: NEGATIVE
Nitrites: NEGATIVE
Protein, UA: NEGATIVE mg/dl
Protein: NEGATIVE mg/dl
Specific Gravity, UA: 1.02 (ref 1.005–1.030)
Specific gravity: 1.02 (ref 1.005–1.030)
Urobilinogen, UA, POCT: 0.2 EU/dl (ref 0.0–1.0)
Urobilinogen: 0.2 EU/dl (ref 0.0–1.0)
pH (UA): 6 (ref 5–9)
pH, UA: 6 (ref 5–9)

## 2018-04-04 LAB — LIPASE
Lipase: 43 U/L — ABNORMAL LOW (ref 73–393)
Lipase: 43 U/L — ABNORMAL LOW (ref 73–393)

## 2018-04-04 LAB — POC HCG,URINE
HCG urine, QL: NEGATIVE
Pregnancy Test(Urn): NEGATIVE

## 2018-04-04 LAB — CBC WITH AUTOMATED DIFF
BASOPHILS: 0.2 % (ref 0–3)
EOSINOPHILS: 0.7 % (ref 0–5)
HCT: 32.9 % — ABNORMAL LOW (ref 37.0–50.0)
HGB: 10.8 gm/dl — ABNORMAL LOW (ref 13.0–17.2)
IMMATURE GRANULOCYTES: 0.5 % (ref 0.0–3.0)
LYMPHOCYTES: 12.4 % — ABNORMAL LOW (ref 28–48)
MCH: 31.7 pg (ref 25.4–34.6)
MCHC: 32.8 gm/dl (ref 30.0–36.0)
MCV: 96.5 fL (ref 80.0–98.0)
MONOCYTES: 6.9 % (ref 1–13)
MPV: 11.7 fL — ABNORMAL HIGH (ref 6.0–10.0)
NEUTROPHILS: 79.3 % — ABNORMAL HIGH (ref 34–64)
NRBC: 0 (ref 0–0)
PLATELET: 275 10*3/uL (ref 140–450)
RBC: 3.41 M/uL — ABNORMAL LOW (ref 3.60–5.20)
RDW-SD: 58.4 — ABNORMAL HIGH (ref 36.4–46.3)
WBC: 10.3 10*3/uL (ref 4.0–11.0)

## 2018-04-04 LAB — METABOLIC PANEL, COMPREHENSIVE
ALT (SGPT): 15 U/L (ref 12–78)
AST (SGOT): 18 U/L (ref 15–37)
Albumin: 2.8 gm/dl — ABNORMAL LOW (ref 3.4–5.0)
Alk. phosphatase: 129 U/L — ABNORMAL HIGH (ref 45–117)
Anion gap: 5 mmol/L (ref 5–15)
BUN: 4 mg/dl — ABNORMAL LOW (ref 7–25)
Bilirubin, total: 0.5 mg/dl (ref 0.2–1.0)
CO2: 26 mEq/L (ref 21–32)
Calcium: 8.9 mg/dl (ref 8.5–10.1)
Chloride: 107 mEq/L (ref 98–107)
Creatinine: 0.6 mg/dl (ref 0.6–1.3)
GFR est AA: >60
GFR est non-AA: >60
Glucose: 88 mg/dl (ref 74–106)
Potassium: 3.6 mEq/L (ref 3.5–5.1)
Protein, total: 7.4 gm/dl (ref 6.4–8.2)
Sodium: 138 mEq/L (ref 136–145)

## 2018-04-04 MED ORDER — DICYCLOMINE 10 MG CAP
10 mg | Freq: Four times a day (QID) | ORAL | Status: DC | PRN
Start: 2018-04-04 — End: 2018-04-12
  Administered 2018-04-07 (×2): via ORAL

## 2018-04-04 MED ORDER — SODIUM CHLORIDE 0.9 % INJECTION
40 mg | INTRAMUSCULAR | Status: AC
Start: 2018-04-04 — End: 2018-04-03
  Administered 2018-04-04: 04:00:00 via INTRAVENOUS

## 2018-04-04 MED ORDER — NALOXONE 0.4 MG/ML INJECTION
0.4 mg/mL | INTRAMUSCULAR | Status: DC | PRN
Start: 2018-04-04 — End: 2018-04-12

## 2018-04-04 MED ORDER — SODIUM CHLORIDE 0.9 % IJ SYRG
Freq: Three times a day (TID) | INTRAMUSCULAR | Status: DC
Start: 2018-04-04 — End: 2018-04-12
  Administered 2018-04-04 – 2018-04-12 (×32): via INTRAVENOUS

## 2018-04-04 MED ORDER — CIPROFLOXACIN 500 MG TAB
500 mg | ORAL_TABLET | Freq: Two times a day (BID) | ORAL | 0 refills | Status: DC
Start: 2018-04-04 — End: 2018-04-11

## 2018-04-04 MED ORDER — ONDANSETRON (PF) 4 MG/2 ML INJECTION
4 mg/2 mL | INTRAMUSCULAR | Status: AC
Start: 2018-04-04 — End: 2018-04-03
  Administered 2018-04-04: 01:00:00 via INTRAVENOUS

## 2018-04-04 MED ORDER — MORPHINE 4 MG/ML SYRINGE
4 mg/mL | INTRAMUSCULAR | Status: AC
Start: 2018-04-04 — End: 2018-04-03
  Administered 2018-04-04: 01:00:00 via INTRAVENOUS

## 2018-04-04 MED ORDER — SODIUM CHLORIDE 0.9 % IJ SYRG
INTRAMUSCULAR | Status: DC | PRN
Start: 2018-04-04 — End: 2018-04-12
  Administered 2018-04-09: 16:00:00 via INTRAVENOUS

## 2018-04-04 MED ORDER — OMEPRAZOLE 40 MG CAP, DELAYED RELEASE
40 mg | ORAL_CAPSULE | Freq: Every day | ORAL | 0 refills | Status: DC
Start: 2018-04-04 — End: 2018-04-11

## 2018-04-04 MED ORDER — SODIUM CHLORIDE 0.9% BOLUS IV
0.9 % | INTRAVENOUS | Status: AC
Start: 2018-04-04 — End: 2018-04-03
  Administered 2018-04-04: 01:00:00 via INTRAVENOUS

## 2018-04-04 MED ORDER — MORPHINE 2 MG/ML INJECTION
2 mg/mL | INTRAMUSCULAR | Status: DC | PRN
Start: 2018-04-04 — End: 2018-04-04
  Administered 2018-04-04 – 2018-04-05 (×4): via INTRAVENOUS

## 2018-04-04 MED ORDER — ONDANSETRON (PF) 4 MG/2 ML INJECTION
4 mg/2 mL | Freq: Four times a day (QID) | INTRAMUSCULAR | Status: DC | PRN
Start: 2018-04-04 — End: 2018-04-12
  Administered 2018-04-04 – 2018-04-08 (×10): via INTRAVENOUS

## 2018-04-04 MED ORDER — SODIUM CHLORIDE 0.9 % IV
INTRAVENOUS | Status: AC
Start: 2018-04-04 — End: 2018-04-04
  Administered 2018-04-04: 07:00:00 via INTRAVENOUS

## 2018-04-04 MED ORDER — PANTOPRAZOLE 40 MG IV SOLR
40 mg | Freq: Two times a day (BID) | INTRAVENOUS | Status: DC
Start: 2018-04-04 — End: 2018-04-06
  Administered 2018-04-04 – 2018-04-06 (×6): via INTRAVENOUS

## 2018-04-04 MED ORDER — IOPAMIDOL 61 % IV SOLN
300 mg iodine /mL (61 %) | Freq: Once | INTRAVENOUS | Status: AC
Start: 2018-04-04 — End: 2018-04-03
  Administered 2018-04-04: 03:00:00 via INTRAVENOUS

## 2018-04-04 MED ORDER — ACETAMINOPHEN 325 MG TABLET
325 mg | Freq: Four times a day (QID) | ORAL | Status: DC | PRN
Start: 2018-04-04 — End: 2018-04-12

## 2018-04-04 MED ORDER — METRONIDAZOLE 500 MG TAB
500 mg | ORAL_TABLET | Freq: Three times a day (TID) | ORAL | 0 refills | Status: DC
Start: 2018-04-04 — End: 2018-04-11

## 2018-04-04 MED ORDER — CYANOCOBALAMIN 500 MCG TAB
500 mcg | Freq: Every day | ORAL | Status: DC
Start: 2018-04-04 — End: 2018-04-06
  Administered 2018-04-04 – 2018-04-05 (×2): via ORAL

## 2018-04-04 MED FILL — ISOVUE-300  61 % INTRAVENOUS SOLUTION: 300 mg iodine /mL (61 %) | INTRAVENOUS | Qty: 85

## 2018-04-04 MED FILL — VITAMIN B-12 500 MCG TABLET: 500 mcg | ORAL | Qty: 1

## 2018-04-04 MED FILL — MORPHINE 4 MG/ML SYRINGE: 4 mg/mL | INTRAMUSCULAR | Qty: 1

## 2018-04-04 MED FILL — MORPHINE 2 MG/ML INJECTION: 2 mg/mL | INTRAMUSCULAR | Qty: 1

## 2018-04-04 MED FILL — ONDANSETRON (PF) 4 MG/2 ML INJECTION: 4 mg/2 mL | INTRAMUSCULAR | Qty: 2

## 2018-04-04 MED FILL — PANTOPRAZOLE 40 MG IV SOLR: 40 mg | INTRAVENOUS | Qty: 40

## 2018-04-04 NOTE — Progress Notes (Signed)
INTERNAL MEDICINE PROGRESS NOTE  Patient: Ashley [name]   Date of Birth: [birth year]   MRN: [id number]        Assessment / The Hospital course:     Principle Problems:  RUQ , Epigastric pain radiating to the back, intermittent   Gastritis and duodenitis   2.8 cm indeterminate lesion within the left medial hepatic lobe  - presented with RUQ , Epigastric pain radiating to the back, intermittent , associated with N/V , lipase normal  - she diagnosed with pancreatitis few days ago, Lipase normal, pancrease normal on CT   - on admission afebrile, vitals stable, blood workup is unremarkable, CT abd showed Duodenitis   - pain control, PPI , GI consult    Code Status: Full code   DVT prophylaxis: pharmacologic and mechanical  Disposition : home     Today Plan:   Pain improved with IV pain medication that given in the ED   GI consult   PPI       Subjective:   Patient currently denies fever or chills , pain is better     Medical Decision Making   Chart, Images and Lab data reviewed, necessary medical Orders placed   Discussed with nursing staff and Case Manager. .     Vitals:    04/03/18 2325 04/04/18 0602 04/04/18 0801 04/04/18 0858   BP: 109/64 104/56 105/63    Pulse: 91      Resp: 18      Temp: 98.6 ??F (37 ??C)   97.8 ??F (36.6 ??C)   SpO2: 100% 99% 98%    Weight:       Height:         Temp (24hrs), Avg:98.5 ??F (36.9 ??C), Min:97.8 ??F (36.6 ??C), Max:99 ??F (37.2 ??C)    No intake or output data in the 24 hours ending 04/04/18 1152    Physical Exam:   General Appearance:   Appears in no acute distress.,   Skin:   Skin warm & dry, No rash, No jaundice,   Lymph:  There is no lymphadenopathy,   HEENT:   PERRLA, EOMI, Moist oral mucous membranes, conjunctiva clear,   Neck:   Supple, Without masses,   Lungs:   Clear, No wheezes., No rales., Normal respiratory effort,   Heart:   Regular rate and rhythm, No gallop,   Abdomen:   Soft , Non-distended, Normal bowel sounds and Non-tender,   Extremities:   No edema of legs, Normal pedal  and radial pulses,   Neuro:   alert, oriented, affect appropriate, speech fluent, cranial nerves intact, no focal neurological deficits and moves all extremities well    Current medications:     Current Facility-Administered Medications   Medication Dose Route Frequency   ??? cyanocobalamin (VITAMIN B12) tablet 500 mcg  500 mcg Oral DAILY   ??? dicyclomine (BENTYL) capsule 10 mg  10 mg Oral Q6H PRN   ??? acetaminophen (TYLENOL) tablet 650 mg  650 mg Oral Q6H PRN   ??? sodium chloride (NS) flush 5-10 mL  5-10 mL IntraVENous Q8H   ??? sodium chloride (NS) flush 5-10 mL  5-10 mL IntraVENous PRN   ??? naloxone (NARCAN) injection 0.1 mg  0.1 mg IntraVENous PRN   ??? morphine injection 2 mg  2 mg IntraVENous Q4H PRN   ??? 0.9% sodium chloride infusion  100 mL/hr IntraVENous CONTINUOUS   ??? ondansetron (ZOFRAN) injection 4 mg  4 mg IntraVENous Q6H PRN   ??? pantoprazole (PROTONIX) 40 mg  in 0.9% sodium chloride 10 mL injection  40 mg IntraVENous Q12H     Current Outpatient Medications   Medication Sig   ??? metroNIDAZOLE (FLAGYL) 500 mg tablet Take 1 Tab by mouth three (3) times daily for 10 days.   ??? ciprofloxacin HCl (CIPRO) 500 mg tablet Take 1 Tab by mouth two (2) times a day for 10 days.   ??? omeprazole (PRILOSEC) 40 mg capsule Take 1 Cap by mouth daily for 21 days.   ??? ondansetron (ZOFRAN ODT) 4 mg disintegrating tablet Take 1 Tab by mouth every eight (8) hours as needed for Nausea.   ??? dicyclomine (BENTYL) 20 mg tablet Take 1 Tab by mouth every six (6) hours as needed (abdominal cramps) for up to 20 doses.   ??? oxyCODONE-acetaminophen (PERCOCET) 5-325 mg per tablet Take 1 Tab by mouth every four (4) hours as needed for Pain for up to 7 days. Max Daily Amount: 6 Tabs.   ??? cephALEXin (KEFLEX) 500 mg capsule Take 1 Cap by mouth four (4) times daily for 10 days.   ??? lidocaine (LIDODERM) 5 % Apply patch to the affected area for 12 hours a day and remove for 12 hours a day.   ??? acetaminophen (TYLENOL) 325 mg tablet Take 2 Tabs by mouth every six  (6) hours as needed for Pain.          Laboratory and Radiology Data :     Recent Results (from the past 24 hour(s))   CBC WITH AUTOMATED DIFF    Collection Time: 04/03/18  6:50 PM   Result Value Ref Range    WBC 10.3 4.0 - 11.0 1000/mm3    RBC 3.41 (L) 3.60 - 5.20 M/uL    HGB 10.8 (L) 13.0 - 17.2 gm/dl    HCT 32.9 (L) 37.0 - 50.0 %    MCV 96.5 80.0 - 98.0 fL    MCH 31.7 25.4 - 34.6 pg    MCHC 32.8 30.0 - 36.0 gm/dl    PLATELET 275 140 - 450 1000/mm3    MPV 11.7 (H) 6.0 - 10.0 fL    RDW-SD 58.4 (H) 36.4 - 46.3      NRBC 0 0 - 0      IMMATURE GRANULOCYTES 0.5 0.0 - 3.0 %    NEUTROPHILS 79.3 (H) 34 - 64 %    LYMPHOCYTES 12.4 (L) 28 - 48 %    MONOCYTES 6.9 1 - 13 %    EOSINOPHILS 0.7 0 - 5 %    BASOPHILS 0.2 0 - 3 %   LIPASE    Collection Time: 04/03/18  6:50 PM   Result Value Ref Range    Lipase 43 (L) 73 - 324 U/L   METABOLIC PANEL, COMPREHENSIVE    Collection Time: 04/03/18  6:50 PM   Result Value Ref Range    Sodium 138 136 - 145 mEq/L    Potassium 3.6 3.5 - 5.1 mEq/L    Chloride 107 98 - 107 mEq/L    CO2 26 21 - 32 mEq/L    Glucose 88 74 - 106 mg/dl    BUN 4 (L) 7 - 25 mg/dl    Creatinine 0.6 0.6 - 1.3 mg/dl    GFR est AA >60.0      GFR est non-AA >60      Calcium 8.9 8.5 - 10.1 mg/dl    AST (SGOT) 18 15 - 37 U/L    ALT (SGPT) 15 12 - 78 U/L  Alk. phosphatase 129 (H) 45 - 117 U/L    Bilirubin, total 0.5 0.2 - 1.0 mg/dl    Protein, total 7.4 6.4 - 8.2 gm/dl    Albumin 2.8 (L) 3.4 - 5.0 gm/dl    Anion gap 5 5 - 15 mmol/L   POC URINE MACROSCOPIC    Collection Time: 04/03/18  6:59 PM   Result Value Ref Range    Glucose Negative NEGATIVE,Negative mg/dl    Bilirubin Negative NEGATIVE,Negative      Ketone Trace (A) NEGATIVE,Negative mg/dl    Specific gravity 1.020 1.005 - 1.030      Blood Trace-lysed (A) NEGATIVE,Negative      pH (UA) 6.0 5 - 9      Protein Negative NEGATIVE,Negative mg/dl    Urobilinogen 0.2 0.0 - 1.0 EU/dl    Nitrites Negative NEGATIVE,Negative      Leukocyte Esterase Negative NEGATIVE,Negative       Color Yellow      Appearance Clear     POC HCG,URINE    Collection Time: 04/03/18  7:02 PM   Result Value Ref Range    HCG urine, QL negative NEGATIVE,Negative,negative         XR Results:  Results from Hospital Encounter encounter on 01/20/18   XR KNEE RT MIN 4 V    Narrative Indication: 79 without injury    4 views right knee show no bony or soft tissue abnormality.      Impression IMPRESSION: Normal study.         CT Results:  Results from [facility] encounter on 04/03/18   CT ABD PELV W CONT    Narrative CT of the abdomen and pelvis     INDICATION:  RUQ pain.      COMPARISON: No relevant studies.    TECHNIQUE: CT of the abdomen and pelvis was performed with nonionic intravenous  contrast with coronal and sagittal reformatted images.    DICOM format image data is available to non-affiliated external healthcare  facilities or entities on a secure, media free, reciprocally searchable basis  with patient authorization for 12 months following the date of the study.    FINDINGS:    LOWER THORAX: There are bilateral lower lobe infiltrates, left greater than  right. Additional areas of subsegmental atelectasis.    ABDOMEN: There is no free air or free fluid. There is a slightly ill-defined 2.8  cm hypoenhancing lesion within the left medial hepatic lobe adjacent to the  falciform ligament for which MRI of the abdomen with and without contrast is  suggested. There is fatty infiltration of the liver. There are no radiopaque  gallstones. The adrenal glands are unremarkable. There is no hydronephrosis or  nephrolithiasis.    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 There is thickening of  the horizontal portion of the duodenum with surrounding free fluid and  inflammatory change. This is inferior to the uncinate process of the pancreas  and inflammation likely secondary to duodenal inflammation. Lipase normal on  chart review. No definite free air. No dilated loops of bowel.         PELVIS: There is  no focal bladder wall thickening. The uterus is present.  Negative for aortic dissection.    OSSEOUS STRUCTURES: Acutely intact.      Impression IMPRESSION:    1.  Thickening of the horizontal portion of duodenum with surrounding  inflammatory changes. Findings likely reflect duodenitis. Superimposed ulcer not  excluded. While inflammation is near the pancreas, lipase  is normal in chart  review.  2.  Fatty infiltration of the liver. 2.8 cm indeterminate lesion within the left  medial hepatic lobe. MRI with and without contrast recommended.  3.  Bibasilar infiltrates/atelectasis.         MRI Results:  No results found for this or any previous visit.    Nuclear Medicine Results:  No results found for this or any previous visit.    [place] Results:  No results found for this or any previous visit.    IR Results:  No results found for this or any previous visit.    VAS/US Results:  Results from Hospital Encounter encounter on 01/20/18   DUPLEX LOWER EXT VENOUS RIGHT    Narrative                                                               Study ID:   [id number]                                                 [facility]                                            [address] [place],                                          [place]  Lower Extremity Venous Report    Name: JACKELYNN, HOSIE Date: 01/20/2018 08:00 PM  MRN: [id number]                      Patient Location: [id number]  DOB: 1985/05/28                  Age: 1 yrs  Gender: Female                   Account #: [id number]  Reason For Study: Right leg pain  Ordering Physician: Johnella Moloney    Performed By: Lowella Dell,  RVS    Interpretation Summary  No evidence of superficial or deep vein thrombosis noted in the right lower  extremity.  No evidence of deep venous thrombosis in the contralateral/left common   femoral  vein.  _____________________________________________________________________________  __      QUALITY/PROCEDURE  Limited unilateral venous duplex performed of the right leg. Quality of the  study is good. M79.604.    HISTORY/SYMPTOMS  Right leg pain.    RIGHT LEG  The common femoral, femoral, popliteal, posterior tibial and peroneal veins  were examined with duplex ultrasound. The deep veins were patent and  compressible with no evidence of intraluminal thrombus. Spontaneous, phasic  venous flow with normal augmentation noted throughout the right leg.    RIGHT SAPHENOUS VEINS  Spectral Doppler exam demonstrates normal venous flow in the right great  saphenous vein. Compression of the right great saphenous vein is complete.  Filling defects in the right great saphenous vein are absent.      LEFT LEG  Spectral Doppler exam demonstrates normal venous flow in the left common  femoral vein. Compression of the left common femoral vein is complete.   Filling  defects in the left common femoral vein are absent.      Electronically signed byDR Wenda Low, MD   01/21/2018 01:10 AM       Admission HPI:           Christy Sartorius M.D.  [facility]  Page [phone]

## 2018-04-04 NOTE — Progress Notes (Signed)
Received care of patient, alert and oriented x 4. Vitals stable. Complaining of abdominal pain/back pain 10/10 rate, given med per prn order. Will continue to monitor.

## 2018-04-04 NOTE — Progress Notes (Signed)
Verbalizes pain 10/10, abdomen and back, unrelieved with morphine. Dr. Shela Commons called, orders received.

## 2018-04-04 NOTE — Progress Notes (Signed)
Pt c/o nausea prn zofran given will continue to monitor

## 2018-04-04 NOTE — Progress Notes (Signed)
 [facility]: Medication History    Medication History Completed Prior to Order Reconciliation?  YES  If no and discrepancies were noted please contact attending physician or pharmacist to follow-up: N/A - Medication history was obtained prior to reconciliation.    Information obtained from (list all that apply, 2 sources preferred): Patient and Other: [employer]   If a history was not reviewed directly with patient/caregiver please comment with the reason why: N/A     Antibiotic use in the last 90 days (3 months): YES      Missing Medication Identified  NO  Number of medications: -  Indicate action taken: Updated Medication List N/A       Wrong Medication Identified YES   Number of medications: 2  Indicate action taken: Updated Medication List VITAMIN B12 and ROBAXIN       Wrong Dose/Interval/Route Identified NO              Number of medications: -   Indicate action taken: Updated Medication List N/A         Is patient currently taking warfarin:  No        Medication Compliance Issues and/or Medication Concerns: N/A      Allergies: Aspirin; Ibuprofen; and Nsaids (non-steroidal anti-inflammatory drug)    Prior to Admission Medications:    Prior to Admission Medications   Prescriptions Last Dose Informant Patient Reported? Taking?   acetaminophen (TYLENOL) 325 mg tablet   No Yes   Sig: Take 2 Tabs by mouth every six (6) hours as needed for Pain.   cephALEXin (KEFLEX) 500 mg capsule   No Yes   Sig: Take 1 Cap by mouth four (4) times daily for 10 days.   dicyclomine (BENTYL) 20 mg tablet   No Yes   Sig: Take 1 Tab by mouth every six (6) hours as needed (abdominal cramps) for up to 20 doses.   lidocaine (LIDODERM) 5 %   No Yes   Sig: Apply patch to the affected area for 12 hours a day and remove for 12 hours a day.   ondansetron (ZOFRAN ODT) 4 mg disintegrating tablet   No Yes   Sig: Take 1 Tab by mouth every eight (8) hours as needed for Nausea.   oxyCODONE-acetaminophen (PERCOCET) 5-325 mg per tablet    No Yes   Sig: Take 1 Tab by mouth every four (4) hours as needed for Pain for up to 7 days. Max Daily Amount: 6 Tabs.      Facility-Administered Medications: None         Alexis N. Lang, CPHT   Contact: [phone]

## 2018-04-04 NOTE — Progress Notes (Signed)
Bedside report received from [facility]

## 2018-04-04 NOTE — Progress Notes (Signed)
Pt AOx4 c/o pain. Morphine given. Pt resting comfotbaly. No complaints at this time.

## 2018-04-04 NOTE — H&P (Signed)
Medicine History and Physical    Patient: Ashley [name] Age: 33 y.o. Sex: female    Date of Birth: 03-12-1986 Admit Date: 04/03/2018 PCP: Roosevelt Locks Da, MD   MRN: [id number]  CSN: [id number]         Assessment   RUQ abdominal pain [R10.11]  Gastritis and duodenitis [K29.90  Liver lesion, 2.8 cm indeterminate lesion within the left  medial hepatic lobe    Plan   IVF   Pain management   Zofran   Protonix   Consider GI evaluation if no improvement   Diet Cardiac   DVT PPX CLD  ACP: CODE STATUS FULL CODE       Chief Complaint:  Chief Complaint   Patient presents with   ??? Abdominal Pain       HPI:   Ashley [name] is a 33 y.o. year old female who presents with abdominal pain, RUQ, nausea and vomiting.   Patient has hx of gastric bypass. She took motrin earlier without relive. Denies blood in stool, no diarrhea. CT shows duodenitis and cannot r/o ulcer. Lipase normal today, she was dx with pancreatitis about two days ago. She continues to have significant abdominal pain.       CT ABD PELV W CONT (Final result)   Result time 04/03/18 22:37:27   Final result by Iline Oven, MD (04/03/18 22:37:27)                Impression:    IMPRESSION:    1. ??Thickening of the horizontal portion of duodenum with surrounding  inflammatory changes. Findings likely reflect duodenitis. Superimposed ulcer not  excluded. While inflammation is near the pancreas, lipase is normal in chart  review.  2. ??Fatty infiltration of the liver. 2.8 cm indeterminate lesion within the left  medial hepatic lobe. MRI with and without contrast recommended.  3. ??Bibasilar infiltrates/atelectasis.                  Review of Systems - 12 Point ROS -ve except what is noted in the HPI.     Past Medical History:  Past Medical History:   Diagnosis Date   ??? Ill-defined condition     bronchitis   ??? Pneumonia        Past Surgical History:  Past Surgical History:   Procedure Laterality Date   ??? HX CESAREAN SECTION  2002, 2004, 2008    x 3   ??? HX CESAREAN SECTION     ??? HX  GASTRIC BYPASS  10-2009    [facility]   ??? HX GASTRIC BYPASS     ??? HX GASTRIC BYPASS         Family History:  Family History   Problem Relation Age of Onset   ??? Diabetes Mother    ??? Hypertension Father    ??? Heart Disease Maternal Grandmother    ??? Heart Attack Maternal Grandmother    ??? High Cholesterol Maternal Grandmother    ??? Arthritis-osteo Maternal Grandmother    ??? Stroke Paternal Aunt    ??? Kidney Disease Maternal Aunt        Social History:  Social History     Socioeconomic History   ??? Marital status: SINGLE     Spouse name: Not on file   ??? Number of children: Not on file   ??? Years of education: Not on file   ??? Highest education level: Not on file   Tobacco Use   ??? Smoking status: Former  Smoker     Types: Cigarettes     Last attempt to quit: 11/12/2009     Years since quitting: 8.3   ??? Smokeless tobacco: Never Used   Substance and Sexual Activity   ??? Alcohol use: Not Currently     Alcohol/week: 0.0 - 4.0 standard drinks     Comment: occassional   ??? Drug use: No   ??? Sexual activity: Yes     Partners: Male     Birth control/protection: None   Social History Narrative    ** Merged History Encounter **            Home Medications:  Prior to Admission medications    Medication Sig Start Date End Date Taking? Authorizing Provider   metroNIDAZOLE (FLAGYL) 500 mg tablet Take 1 Tab by mouth three (3) times daily for 10 days. 04/03/18 04/13/18 Yes Batichon, Deniece N, PA   ciprofloxacin HCl (CIPRO) 500 mg tablet Take 1 Tab by mouth two (2) times a day for 10 days. 04/03/18 04/13/18 Yes Batichon, Deniece N, PA   omeprazole (PRILOSEC) 40 mg capsule Take 1 Cap by mouth daily for 21 days. 04/03/18 04/24/18 Yes Batichon, Deniece N, PA   ondansetron (ZOFRAN ODT) 4 mg disintegrating tablet Take 1 Tab by mouth every eight (8) hours as needed for Nausea. 04/01/18   Luvenia Redden, MD   dicyclomine (BENTYL) 20 mg tablet Take 1 Tab by mouth every six (6) hours as needed (abdominal cramps) for up to 20 doses. 04/01/18   Luvenia Redden,  MD   oxyCODONE-acetaminophen (PERCOCET) 5-325 mg per tablet Take 1 Tab by mouth every four (4) hours as needed for Pain for up to 7 days. Max Daily Amount: 6 Tabs. 04/01/18 04/08/18  Luvenia Redden, MD   cephALEXin (KEFLEX) 500 mg capsule Take 1 Cap by mouth four (4) times daily for 10 days. 04/01/18 04/11/18  Luvenia Redden, MD   lidocaine (LIDODERM) 5 % Apply patch to the affected area for 12 hours a day and remove for 12 hours a day. 01/20/18   Demorest Loffler, PA-C   acetaminophen (TYLENOL) 325 mg tablet Take 2 Tabs by mouth every six (6) hours as needed for Pain. 01/20/18   Helbert Loffler, PA-C   methocarbamol (ROBAXIN-750) 750 mg tablet Take 1 Tab by mouth four (4) times daily. 01/20/18   Seabrook Loffler, PA-C   cyanocobalamin (B-12 DOTS) 500 mcg tablet Take 500 mcg by mouth daily.      Provider, Historical       Allergies:  Allergies   Allergen Reactions   ??? Aspirin Other (comments)     Stomach ulcer   ??? Ibuprofen Unknown (comments)     Gastric Bypass--advised to avoid   ??? Nsaids (Non-Steroidal Anti-Inflammatory Drug) Other (comments)     Hx gastric bypass         Physical Exam:     Visit Vitals  BP 109/64 (BP 1 Location: Right arm, BP Patient Position: Supine)   Pulse 91   Temp 98.6 ??F (37 ??C)   Resp 18   Ht 5' (1.524 m)   Wt 88.9 kg (196 lb)   SpO2 100%   BMI 38.28 kg/m??       Physical Exam:  General appearance: alert, cooperative, no distress, appears stated age  Head: Normocephalic, without obvious abnormality, atraumatic  Neck: supple, trachea midline  Lungs: clear to auscultation bilaterally  Heart: regular rate and rhythm, S1, S2 normal, no murmur,  click, rub or gallop  Abdomen: soft, mild epigastric RUQ tenderness to moderate palpation. Bowel sounds normal. No masses,  no organomegaly  Extremities: extremities normal, atraumatic, no cyanosis or edema  Skin: dry skin, dry MM  Neurologic: Grossly normal    Intake and Output:  Current Shift:  No intake/output data recorded.  Last three shifts:  No  intake/output data recorded.    Lab/Data Reviewed:  Lab:   Recent Results (from the past 12 hour(s))   CBC WITH AUTOMATED DIFF    Collection Time: 04/03/18  6:50 PM   Result Value Ref Range    WBC 10.3 4.0 - 11.0 1000/mm3    RBC 3.41 (L) 3.60 - 5.20 M/uL    HGB 10.8 (L) 13.0 - 17.2 gm/dl    HCT 32.9 (L) 37.0 - 50.0 %    MCV 96.5 80.0 - 98.0 fL    MCH 31.7 25.4 - 34.6 pg    MCHC 32.8 30.0 - 36.0 gm/dl    PLATELET 275 140 - 450 1000/mm3    MPV 11.7 (H) 6.0 - 10.0 fL    RDW-SD 58.4 (H) 36.4 - 46.3      NRBC 0 0 - 0      IMMATURE GRANULOCYTES 0.5 0.0 - 3.0 %    NEUTROPHILS 79.3 (H) 34 - 64 %    LYMPHOCYTES 12.4 (L) 28 - 48 %    MONOCYTES 6.9 1 - 13 %    EOSINOPHILS 0.7 0 - 5 %    BASOPHILS 0.2 0 - 3 %   LIPASE    Collection Time: 04/03/18  6:50 PM   Result Value Ref Range    Lipase 43 (L) 73 - 563 U/L   METABOLIC PANEL, COMPREHENSIVE    Collection Time: 04/03/18  6:50 PM   Result Value Ref Range    Sodium 138 136 - 145 mEq/L    Potassium 3.6 3.5 - 5.1 mEq/L    Chloride 107 98 - 107 mEq/L    CO2 26 21 - 32 mEq/L    Glucose 88 74 - 106 mg/dl    BUN 4 (L) 7 - 25 mg/dl    Creatinine 0.6 0.6 - 1.3 mg/dl    GFR est AA >60.0      GFR est non-AA >60      Calcium 8.9 8.5 - 10.1 mg/dl    AST (SGOT) 18 15 - 37 U/L    ALT (SGPT) 15 12 - 78 U/L    Alk. phosphatase 129 (H) 45 - 117 U/L    Bilirubin, total 0.5 0.2 - 1.0 mg/dl    Protein, total 7.4 6.4 - 8.2 gm/dl    Albumin 2.8 (L) 3.4 - 5.0 gm/dl    Anion gap 5 5 - 15 mmol/L   POC URINE MACROSCOPIC    Collection Time: 04/03/18  6:59 PM   Result Value Ref Range    Glucose Negative NEGATIVE,Negative mg/dl    Bilirubin Negative NEGATIVE,Negative      Ketone Trace (A) NEGATIVE,Negative mg/dl    Specific gravity 1.020 1.005 - 1.030      Blood Trace-lysed (A) NEGATIVE,Negative      pH (UA) 6.0 5 - 9      Protein Negative NEGATIVE,Negative mg/dl    Urobilinogen 0.2 0.0 - 1.0 EU/dl    Nitrites Negative NEGATIVE,Negative      Leukocyte Esterase Negative NEGATIVE,Negative      Color Yellow       Appearance Clear     POC  HCG,URINE    Collection Time: 04/03/18  7:02 PM   Result Value Ref Range    HCG urine, QL negative NEGATIVE,Negative,negative         Imaging:    Ct Abd Pelv W Cont    Result Date: 04/03/2018  CT of the abdomen and pelvis INDICATION:  RUQ pain.    COMPARISON: No relevant studies. TECHNIQUE: CT of the abdomen and pelvis was performed with nonionic intravenous contrast with coronal and sagittal reformatted images. DICOM format image data is available to non-affiliated external healthcare facilities or entities on a secure, media free, reciprocally searchable basis with patient authorization for 12 months following the date of the study. FINDINGS: LOWER THORAX: There are bilateral lower lobe infiltrates, left greater than right. Additional areas of subsegmental atelectasis. ABDOMEN: There is no free air or free fluid. There is a slightly ill-defined 2.8 cm hypoenhancing lesion within the left medial hepatic lobe adjacent to the falciform ligament for which MRI of the abdomen with and without contrast is suggested. There is fatty infiltration of the liver. There are no radiopaque gallstones. The adrenal glands are unremarkable. There is no hydronephrosis or nephrolithiasis. 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 There is thickening of the horizontal portion of the duodenum with surrounding free fluid and inflammatory change. This is inferior to the uncinate process of the pancreas and inflammation likely secondary to duodenal inflammation. Lipase normal on chart review. No definite free air. No dilated loops of bowel.     PELVIS: There is no focal bladder wall thickening. The uterus is present. Negative for aortic dissection. OSSEOUS STRUCTURES: Acutely intact.     IMPRESSION: 1.  Thickening of the horizontal portion of duodenum with surrounding inflammatory changes. Findings likely reflect duodenitis. Superimposed ulcer not excluded. While inflammation is near the pancreas,  lipase is normal in chart review. 2.  Fatty infiltration of the liver. 2.8 cm indeterminate lesion within the left medial hepatic lobe. MRI with and without contrast recommended. 3.  Bibasilar infiltrates/atelectasis.         Rosalene Billings, MD  April 04, 2018

## 2018-04-04 NOTE — H&P (Signed)
Medicine History and Physical    Patient: Ashley [name] Age: 33 y.o. Sex: female    Date of Birth: 04-Mar-1986 Admit Date: 04/03/2018 PCP: Roosevelt Locks Da, MD   MRN: [id number]  CSN: [id number]         Assessment   RUQ abdominal pain [R10.11]  Gastritis and duodenitis [K29.90  Liver lesion, 2.8 cm indeterminate lesion within the left  medial hepatic lobe    Plan   IVF   Pain management   Zofran   Protonix   Consider GI evaluation if no improvement   Diet Cardiac   DVT PPX CLD  ACP: CODE STATUS FULL CODE       Chief Complaint:  Chief Complaint   Patient presents with   ??? Abdominal Pain       HPI:   Ashley [name] is a 33 y.o. year old female who presents with abdominal pain, RUQ, nausea and vomiting.   Patient has hx of gastric bypass. She took motrin earlier without relive. Denies blood in stool, no diarrhea. CT shows duodenitis and cannot r/o ulcer. Lipase normal today, she was dx with pancreatitis about two days ago. She continues to have significant abdominal pain.       CT ABD PELV W CONT (Final result)   Result time 04/03/18 22:37:27   Final result by Iline Oven, MD (04/03/18 22:37:27)                Impression:    IMPRESSION:    1. ??Thickening of the horizontal portion of duodenum with surrounding  inflammatory changes. Findings likely reflect duodenitis. Superimposed ulcer not  excluded. While inflammation is near the pancreas, lipase is normal in chart  review.  2. ??Fatty infiltration of the liver. 2.8 cm indeterminate lesion within the left  medial hepatic lobe. MRI with and without contrast recommended.  3. ??Bibasilar infiltrates/atelectasis.                  Review of Systems - 12 Point ROS -ve except what is noted in the HPI.     Past Medical History:  Past Medical History:   Diagnosis Date   ??? Ill-defined condition     bronchitis   ??? Pneumonia        Past Surgical History:  Past Surgical History:   Procedure Laterality Date   ??? HX CESAREAN SECTION  2002, 2004, 2008    x 3   ??? HX CESAREAN SECTION      ??? HX GASTRIC BYPASS  10-2009    [facility]   ??? HX GASTRIC BYPASS     ??? HX GASTRIC BYPASS         Family History:  Family History   Problem Relation Age of Onset   ??? Diabetes Mother    ??? Hypertension Father    ??? Heart Disease Maternal Grandmother    ??? Heart Attack Maternal Grandmother    ??? High Cholesterol Maternal Grandmother    ??? Arthritis-osteo Maternal Grandmother    ??? Stroke Paternal Aunt    ??? Kidney Disease Maternal Aunt        Social History:  Social History     Socioeconomic History   ??? Marital status: SINGLE     Spouse name: Not on file   ??? Number of children: Not on file   ??? Years of education: Not on file   ??? Highest education level: Not on file   Tobacco Use   ??? Smoking status: Former  Smoker     Types: Cigarettes     Last attempt to quit: 11/12/2009     Years since quitting: 8.3   ??? Smokeless tobacco: Never Used   Substance and Sexual Activity   ??? Alcohol use: Not Currently     Alcohol/week: 0.0 - 4.0 standard drinks     Comment: occassional   ??? Drug use: No   ??? Sexual activity: Yes     Partners: Male     Birth control/protection: None   Social History Narrative    ** Merged History Encounter **            Home Medications:  Prior to Admission medications    Medication Sig Start Date End Date Taking? Authorizing Provider   metroNIDAZOLE (FLAGYL) 500 mg tablet Take 1 Tab by mouth three (3) times daily for 10 days. 04/03/18 04/13/18 Yes Batichon, Deniece N, PA   ciprofloxacin HCl (CIPRO) 500 mg tablet Take 1 Tab by mouth two (2) times a day for 10 days. 04/03/18 04/13/18 Yes Batichon, Deniece N, PA   omeprazole (PRILOSEC) 40 mg capsule Take 1 Cap by mouth daily for 21 days. 04/03/18 04/24/18 Yes Batichon, Deniece N, PA   ondansetron (ZOFRAN ODT) 4 mg disintegrating tablet Take 1 Tab by mouth every eight (8) hours as needed for Nausea. 04/01/18   Luvenia Redden, MD   dicyclomine (BENTYL) 20 mg tablet Take 1 Tab by mouth every six (6) hours as needed (abdominal cramps) for up to 20 doses. 04/01/18   Luvenia Redden, MD   oxyCODONE-acetaminophen (PERCOCET) 5-325 mg per tablet Take 1 Tab by mouth every four (4) hours as needed for Pain for up to 7 days. Max Daily Amount: 6 Tabs. 04/01/18 04/08/18  Luvenia Redden, MD   cephALEXin (KEFLEX) 500 mg capsule Take 1 Cap by mouth four (4) times daily for 10 days. 04/01/18 04/11/18  Luvenia Redden, MD   lidocaine (LIDODERM) 5 % Apply patch to the affected area for 12 hours a day and remove for 12 hours a day. 01/20/18   Effinger Loffler, PA-C   acetaminophen (TYLENOL) 325 mg tablet Take 2 Tabs by mouth every six (6) hours as needed for Pain. 01/20/18   Lieder Loffler, PA-C   methocarbamol (ROBAXIN-750) 750 mg tablet Take 1 Tab by mouth four (4) times daily. 01/20/18   Pizzini Loffler, PA-C   cyanocobalamin (B-12 DOTS) 500 mcg tablet Take 500 mcg by mouth daily.      Provider, Historical       Allergies:  Allergies   Allergen Reactions   ??? Aspirin Other (comments)     Stomach ulcer   ??? Ibuprofen Unknown (comments)     Gastric Bypass--advised to avoid   ??? Nsaids (Non-Steroidal Anti-Inflammatory Drug) Other (comments)     Hx gastric bypass         Physical Exam:     Visit Vitals  BP 109/64 (BP 1 Location: Right arm, BP Patient Position: Supine)   Pulse 91   Temp 98.6 ??F (37 ??C)   Resp 18   Ht 5' (1.524 m)   Wt 88.9 kg (196 lb)   SpO2 100%   BMI 38.28 kg/m??       Physical Exam:  General appearance: alert, cooperative, no distress, appears stated age  Head: Normocephalic, without obvious abnormality, atraumatic  Neck: supple, trachea midline  Lungs: clear to auscultation bilaterally  Heart: regular rate and rhythm, S1, S2 normal, no murmur,  click, rub or gallop  Abdomen: soft, mild epigastric RUQ tenderness to moderate palpation. Bowel sounds normal. No masses,  no organomegaly  Extremities: extremities normal, atraumatic, no cyanosis or edema  Skin: dry skin, dry MM  Neurologic: Grossly normal    Intake and Output:  Current Shift:  No intake/output data recorded.   Last three shifts:  No intake/output data recorded.    Lab/Data Reviewed:  Lab:   Recent Results (from the past 12 hour(s))   CBC WITH AUTOMATED DIFF    Collection Time: 04/03/18  6:50 PM   Result Value Ref Range    WBC 10.3 4.0 - 11.0 1000/mm3    RBC 3.41 (L) 3.60 - 5.20 M/uL    HGB 10.8 (L) 13.0 - 17.2 gm/dl    HCT 32.9 (L) 37.0 - 50.0 %    MCV 96.5 80.0 - 98.0 fL    MCH 31.7 25.4 - 34.6 pg    MCHC 32.8 30.0 - 36.0 gm/dl    PLATELET 275 140 - 450 1000/mm3    MPV 11.7 (H) 6.0 - 10.0 fL    RDW-SD 58.4 (H) 36.4 - 46.3      NRBC 0 0 - 0      IMMATURE GRANULOCYTES 0.5 0.0 - 3.0 %    NEUTROPHILS 79.3 (H) 34 - 64 %    LYMPHOCYTES 12.4 (L) 28 - 48 %    MONOCYTES 6.9 1 - 13 %    EOSINOPHILS 0.7 0 - 5 %    BASOPHILS 0.2 0 - 3 %   LIPASE    Collection Time: 04/03/18  6:50 PM   Result Value Ref Range    Lipase 43 (L) 73 - 967 U/L   METABOLIC PANEL, COMPREHENSIVE    Collection Time: 04/03/18  6:50 PM   Result Value Ref Range    Sodium 138 136 - 145 mEq/L    Potassium 3.6 3.5 - 5.1 mEq/L    Chloride 107 98 - 107 mEq/L    CO2 26 21 - 32 mEq/L    Glucose 88 74 - 106 mg/dl    BUN 4 (L) 7 - 25 mg/dl    Creatinine 0.6 0.6 - 1.3 mg/dl    GFR est AA >60.0      GFR est non-AA >60      Calcium 8.9 8.5 - 10.1 mg/dl    AST (SGOT) 18 15 - 37 U/L    ALT (SGPT) 15 12 - 78 U/L    Alk. phosphatase 129 (H) 45 - 117 U/L    Bilirubin, total 0.5 0.2 - 1.0 mg/dl    Protein, total 7.4 6.4 - 8.2 gm/dl    Albumin 2.8 (L) 3.4 - 5.0 gm/dl    Anion gap 5 5 - 15 mmol/L   POC URINE MACROSCOPIC    Collection Time: 04/03/18  6:59 PM   Result Value Ref Range    Glucose Negative NEGATIVE,Negative mg/dl    Bilirubin Negative NEGATIVE,Negative      Ketone Trace (A) NEGATIVE,Negative mg/dl    Specific gravity 1.020 1.005 - 1.030      Blood Trace-lysed (A) NEGATIVE,Negative      pH (UA) 6.0 5 - 9      Protein Negative NEGATIVE,Negative mg/dl    Urobilinogen 0.2 0.0 - 1.0 EU/dl    Nitrites Negative NEGATIVE,Negative       Leukocyte Esterase Negative NEGATIVE,Negative      Color Yellow      Appearance Clear     POC  HCG,URINE    Collection Time: 04/03/18  7:02 PM   Result Value Ref Range    HCG urine, QL negative NEGATIVE,Negative,negative         Imaging:    Ct Abd Pelv W Cont    Result Date: 04/03/2018  CT of the abdomen and pelvis INDICATION:  RUQ pain.    COMPARISON: No relevant studies. TECHNIQUE: CT of the abdomen and pelvis was performed with nonionic intravenous contrast with coronal and sagittal reformatted images. DICOM format image data is available to non-affiliated external healthcare facilities or entities on a secure, media free, reciprocally searchable basis with patient authorization for 12 months following the date of the study. FINDINGS: LOWER THORAX: There are bilateral lower lobe infiltrates, left greater than right. Additional areas of subsegmental atelectasis. ABDOMEN: There is no free air or free fluid. There is a slightly ill-defined 2.8 cm hypoenhancing lesion within the left medial hepatic lobe adjacent to the falciform ligament for which MRI of the abdomen with and without contrast is suggested. There is fatty infiltration of the liver. There are no radiopaque gallstones. The adrenal glands are unremarkable. There is no hydronephrosis or nephrolithiasis. 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 There is thickening of the horizontal portion of the duodenum with surrounding free fluid and inflammatory change. This is inferior to the uncinate process of the pancreas and inflammation likely secondary to duodenal inflammation. Lipase normal on chart review. No definite free air. No dilated loops of bowel.     PELVIS: There is no focal bladder wall thickening. The uterus is present. Negative for aortic dissection. OSSEOUS STRUCTURES: Acutely intact.     IMPRESSION: 1.  Thickening of the horizontal portion of duodenum with surrounding inflammatory changes. Findings likely reflect duodenitis.  Superimposed ulcer not excluded. While inflammation is near the pancreas, lipase is normal in chart review. 2.  Fatty infiltration of the liver. 2.8 cm indeterminate lesion within the left medial hepatic lobe. MRI with and without contrast recommended. 3.  Bibasilar infiltrates/atelectasis.         Rosalene Billings, MD  April 04, 2018

## 2018-04-04 NOTE — Progress Notes (Signed)
INTERNAL MEDICINE PROGRESS NOTE  Patient: Ashley [name]   Date of Birth: 1985-06-04   MRN: [id number]        Assessment / The Hospital course:     Principle Problems:  RUQ , Epigastric pain radiating to the back, intermittent   Gastritis and duodenitis   2.8 cm indeterminate lesion within the left medial hepatic lobe  - presented with RUQ , Epigastric pain radiating to the back, intermittent , associated with N/V , lipase normal  - she diagnosed with pancreatitis few days ago, Lipase normal, pancrease normal on CT   - on admission afebrile, vitals stable, blood workup is unremarkable, CT abd showed Duodenitis   - pain control, PPI , GI consult    Code Status: Full code   DVT prophylaxis: pharmacologic and mechanical  Disposition : home     Today Plan:   Pain improved with IV pain medication that given in the ED   GI consult   PPI       Subjective:   Patient currently denies fever or chills , pain is better     Medical Decision Making   Chart, Images and Lab data reviewed, necessary medical Orders placed   Discussed with nursing staff and Case Manager. .     Vitals:    04/03/18 2325 04/04/18 0602 04/04/18 0801 04/04/18 0858   BP: 109/64 104/56 105/63    Pulse: 91      Resp: 18      Temp: 98.6 ??F (37 ??C)   97.8 ??F (36.6 ??C)   SpO2: 100% 99% 98%    Weight:       Height:         Temp (24hrs), Avg:98.5 ??F (36.9 ??C), Min:97.8 ??F (36.6 ??C), Max:99 ??F (37.2 ??C)    No intake or output data in the 24 hours ending 04/04/18 1152    Physical Exam:   General Appearance:   Appears in no acute distress.,   Skin:   Skin warm & dry, No rash, No jaundice,   Lymph:  There is no lymphadenopathy,   HEENT:   PERRLA, EOMI, Moist oral mucous membranes, conjunctiva clear,   Neck:   Supple, Without masses,   Lungs:   Clear, No wheezes., No rales., Normal respiratory effort,   Heart:   Regular rate and rhythm, No gallop,   Abdomen:   Soft , Non-distended, Normal bowel sounds and Non-tender,    Extremities:   No edema of legs, Normal pedal and radial pulses,   Neuro:   alert, oriented, affect appropriate, speech fluent, cranial nerves intact, no focal neurological deficits and moves all extremities well    Current medications:     Current Facility-Administered Medications   Medication Dose Route Frequency   ??? cyanocobalamin (VITAMIN B12) tablet 500 mcg  500 mcg Oral DAILY   ??? dicyclomine (BENTYL) capsule 10 mg  10 mg Oral Q6H PRN   ??? acetaminophen (TYLENOL) tablet 650 mg  650 mg Oral Q6H PRN   ??? sodium chloride (NS) flush 5-10 mL  5-10 mL IntraVENous Q8H   ??? sodium chloride (NS) flush 5-10 mL  5-10 mL IntraVENous PRN   ??? naloxone (NARCAN) injection 0.1 mg  0.1 mg IntraVENous PRN   ??? morphine injection 2 mg  2 mg IntraVENous Q4H PRN   ??? 0.9% sodium chloride infusion  100 mL/hr IntraVENous CONTINUOUS   ??? ondansetron (ZOFRAN) injection 4 mg  4 mg IntraVENous Q6H PRN   ??? pantoprazole (PROTONIX) 40 mg  in 0.9% sodium chloride 10 mL injection  40 mg IntraVENous Q12H     Current Outpatient Medications   Medication Sig   ??? metroNIDAZOLE (FLAGYL) 500 mg tablet Take 1 Tab by mouth three (3) times daily for 10 days.   ??? ciprofloxacin HCl (CIPRO) 500 mg tablet Take 1 Tab by mouth two (2) times a day for 10 days.   ??? omeprazole (PRILOSEC) 40 mg capsule Take 1 Cap by mouth daily for 21 days.   ??? ondansetron (ZOFRAN ODT) 4 mg disintegrating tablet Take 1 Tab by mouth every eight (8) hours as needed for Nausea.   ??? dicyclomine (BENTYL) 20 mg tablet Take 1 Tab by mouth every six (6) hours as needed (abdominal cramps) for up to 20 doses.   ??? oxyCODONE-acetaminophen (PERCOCET) 5-325 mg per tablet Take 1 Tab by mouth every four (4) hours as needed for Pain for up to 7 days. Max Daily Amount: 6 Tabs.   ??? cephALEXin (KEFLEX) 500 mg capsule Take 1 Cap by mouth four (4) times daily for 10 days.   ??? lidocaine (LIDODERM) 5 % Apply patch to the affected area for 12 hours a day and remove for 12 hours a day.    ??? acetaminophen (TYLENOL) 325 mg tablet Take 2 Tabs by mouth every six (6) hours as needed for Pain.          Laboratory and Radiology Data :     Recent Results (from the past 24 hour(s))   CBC WITH AUTOMATED DIFF    Collection Time: 04/03/18  6:50 PM   Result Value Ref Range    WBC 10.3 4.0 - 11.0 1000/mm3    RBC 3.41 (L) 3.60 - 5.20 M/uL    HGB 10.8 (L) 13.0 - 17.2 gm/dl    HCT 32.9 (L) 37.0 - 50.0 %    MCV 96.5 80.0 - 98.0 fL    MCH 31.7 25.4 - 34.6 pg    MCHC 32.8 30.0 - 36.0 gm/dl    PLATELET 275 140 - 450 1000/mm3    MPV 11.7 (H) 6.0 - 10.0 fL    RDW-SD 58.4 (H) 36.4 - 46.3      NRBC 0 0 - 0      IMMATURE GRANULOCYTES 0.5 0.0 - 3.0 %    NEUTROPHILS 79.3 (H) 34 - 64 %    LYMPHOCYTES 12.4 (L) 28 - 48 %    MONOCYTES 6.9 1 - 13 %    EOSINOPHILS 0.7 0 - 5 %    BASOPHILS 0.2 0 - 3 %   LIPASE    Collection Time: 04/03/18  6:50 PM   Result Value Ref Range    Lipase 43 (L) 73 - 401 U/L   METABOLIC PANEL, COMPREHENSIVE    Collection Time: 04/03/18  6:50 PM   Result Value Ref Range    Sodium 138 136 - 145 mEq/L    Potassium 3.6 3.5 - 5.1 mEq/L    Chloride 107 98 - 107 mEq/L    CO2 26 21 - 32 mEq/L    Glucose 88 74 - 106 mg/dl    BUN 4 (L) 7 - 25 mg/dl    Creatinine 0.6 0.6 - 1.3 mg/dl    GFR est AA >60.0      GFR est non-AA >60      Calcium 8.9 8.5 - 10.1 mg/dl    AST (SGOT) 18 15 - 37 U/L    ALT (SGPT) 15 12 - 78 U/L  Alk. phosphatase 129 (H) 45 - 117 U/L    Bilirubin, total 0.5 0.2 - 1.0 mg/dl    Protein, total 7.4 6.4 - 8.2 gm/dl    Albumin 2.8 (L) 3.4 - 5.0 gm/dl    Anion gap 5 5 - 15 mmol/L   POC URINE MACROSCOPIC    Collection Time: 04/03/18  6:59 PM   Result Value Ref Range    Glucose Negative NEGATIVE,Negative mg/dl    Bilirubin Negative NEGATIVE,Negative      Ketone Trace (A) NEGATIVE,Negative mg/dl    Specific gravity 1.020 1.005 - 1.030      Blood Trace-lysed (A) NEGATIVE,Negative      pH (UA) 6.0 5 - 9      Protein Negative NEGATIVE,Negative mg/dl    Urobilinogen 0.2 0.0 - 1.0 EU/dl     Nitrites Negative NEGATIVE,Negative      Leukocyte Esterase Negative NEGATIVE,Negative      Color Yellow      Appearance Clear     POC HCG,URINE    Collection Time: 04/03/18  7:02 PM   Result Value Ref Range    HCG urine, QL negative NEGATIVE,Negative,negative         XR Results:  Results from Hospital Encounter encounter on 01/20/18   XR KNEE RT MIN 4 V    Narrative Indication: 79 without injury    4 views right knee show no bony or soft tissue abnormality.      Impression IMPRESSION: Normal study.         CT Results:  Results from [place] encounter on 04/03/18   CT ABD PELV W CONT    Narrative CT of the abdomen and pelvis     INDICATION:  RUQ pain.      COMPARISON: No relevant studies.    TECHNIQUE: CT of the abdomen and pelvis was performed with nonionic intravenous  contrast with coronal and sagittal reformatted images.    DICOM format image data is available to non-affiliated external healthcare  facilities or entities on a secure, media free, reciprocally searchable basis  with patient authorization for 12 months following the date of the study.    FINDINGS:    LOWER THORAX: There are bilateral lower lobe infiltrates, left greater than  right. Additional areas of subsegmental atelectasis.    ABDOMEN: There is no free air or free fluid. There is a slightly ill-defined 2.8  cm hypoenhancing lesion within the left medial hepatic lobe adjacent to the  falciform ligament for which MRI of the abdomen with and without contrast is  suggested. There is fatty infiltration of the liver. There are no radiopaque  gallstones. The adrenal glands are unremarkable. There is no hydronephrosis or  nephrolithiasis.    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 There is thickening of  the horizontal portion of the duodenum with surrounding free fluid and  inflammatory change. This is inferior to the uncinate process of the pancreas   and inflammation likely secondary to duodenal inflammation. Lipase normal on  chart review. No definite free air. No dilated loops of bowel.         PELVIS: There is no focal bladder wall thickening. The uterus is present.  Negative for aortic dissection.    OSSEOUS STRUCTURES: Acutely intact.      Impression IMPRESSION:    1.  Thickening of the horizontal portion of duodenum with surrounding  inflammatory changes. Findings likely reflect duodenitis. Superimposed ulcer not  excluded. While inflammation is near the pancreas, lipase  is normal in chart  review.  2.  Fatty infiltration of the liver. 2.8 cm indeterminate lesion within the left  medial hepatic lobe. MRI with and without contrast recommended.  3.  Bibasilar infiltrates/atelectasis.         MRI Results:  No results found for this or any previous visit.    Nuclear Medicine Results:  No results found for this or any previous visit.    [place] Results:  No results found for this or any previous visit.    IR Results:  No results found for this or any previous visit.    VAS/US Results:  Results from Hospital Encounter encounter on 01/20/18   DUPLEX LOWER EXT VENOUS RIGHT    Narrative                                                               Study ID:   [id number]                                                 [facility]                                            [address] [place],                                          [place]  Lower Extremity Venous Report    Name: DELLAMAE, ROSAMILIA Date: 01/20/2018 08:00 PM  MRN: [id number]                      Patient Location: [id number]  DOB: 02-14-1986                  Age: 40 yrs   Gender: Female                   Account #: [id number]  Reason For Study: Right leg pain  Ordering Physician: Johnella Moloney    Performed By: Lowella Dell, RVS    Interpretation Summary  No evidence of superficial or deep vein thrombosis noted in the right lower  extremity.  No evidence of deep venous thrombosis in the contralateral/left common   femoral  vein.  _____________________________________________________________________________  __      QUALITY/PROCEDURE  Limited unilateral venous duplex performed of the right leg. Quality of the  study is good. M79.604.    HISTORY/SYMPTOMS  Right leg pain.    RIGHT LEG  The common femoral, femoral, popliteal, posterior tibial and peroneal veins  were examined with duplex ultrasound. The deep veins were patent and  compressible with no evidence of intraluminal thrombus. Spontaneous, phasic  venous flow with normal augmentation noted throughout the right leg.    RIGHT SAPHENOUS VEINS  Spectral Doppler exam demonstrates normal venous flow in the right great  saphenous vein. Compression of the right great saphenous vein is complete.  Filling defects in the right great saphenous vein are absent.      LEFT LEG  Spectral Doppler exam demonstrates normal venous flow in the left common  femoral vein. Compression of the left common femoral vein is complete.   Filling  defects in the left common femoral vein are absent.      Electronically signed byDR Wenda Low, MD   01/21/2018 01:10 AM       Admission HPI:           Christy Sartorius M.D.  [facility]  Page [phone]

## 2018-04-04 NOTE — Progress Notes (Signed)
Received care of patient, alert and oriented x 4. Vitals stable. Complaining of abdominal pain/back pain 10/10 rate, given med per prn order. Will continue to monitor.

## 2018-04-04 NOTE — Progress Notes (Signed)
Bedside report received from Danai

## 2018-04-04 NOTE — Progress Notes (Signed)
[facility]: Medication History    Medication History Completed Prior to Order Reconciliation?  YES  If "no" and discrepancies were noted please contact attending physician or pharmacist to follow-up: N/A - Medication history was obtained prior to reconciliation.    Information obtained from (list all that apply, 2 sources preferred): Patient and Other: [employer]   If a history was not reviewed directly with patient/caregiver please comment with the reason why: N/A     Antibiotic use in the last 90 days (3 months): YES      Missing Medication Identified  NO  Number of medications: -  Indicate action taken: Updated Medication List N/A       Wrong Medication Identified YES   Number of medications: 2  Indicate action taken: Updated Medication List VITAMIN B12 and ROBAXIN       Wrong Dose/Interval/Route Identified NO              Number of medications: -   Indicate action taken: Updated Medication List N/A         Is patient currently taking warfarin:  No        Medication Compliance Issues and/or Medication Concerns: N/A      Allergies: Aspirin; Ibuprofen; and Nsaids (non-steroidal anti-inflammatory drug)    Prior to Admission Medications:    Prior to Admission Medications   Prescriptions Last Dose Informant Patient Reported? Taking?   acetaminophen (TYLENOL) 325 mg tablet   No Yes   Sig: Take 2 Tabs by mouth every six (6) hours as needed for Pain.   cephALEXin (KEFLEX) 500 mg capsule   No Yes   Sig: Take 1 Cap by mouth four (4) times daily for 10 days.   dicyclomine (BENTYL) 20 mg tablet   No Yes   Sig: Take 1 Tab by mouth every six (6) hours as needed (abdominal cramps) for up to 20 doses.   lidocaine (LIDODERM) 5 %   No Yes   Sig: Apply patch to the affected area for 12 hours a day and remove for 12 hours a day.   ondansetron (ZOFRAN ODT) 4 mg disintegrating tablet   No Yes   Sig: Take 1 Tab by mouth every eight (8) hours as needed for Nausea.    oxyCODONE-acetaminophen (PERCOCET) 5-325 mg per tablet   No Yes   Sig: Take 1 Tab by mouth every four (4) hours as needed for Pain for up to 7 days. Max Daily Amount: 6 Tabs.      Facility-Administered Medications: None         Alexis N. Roxan Hockey, CPHT   Contact: [phone]

## 2018-04-04 NOTE — Progress Notes (Signed)
Pt c/o nausea prn zofran given will continue to monitor

## 2018-04-04 NOTE — Progress Notes (Signed)
Verbalizes pain 10/10, abdomen and back, unrelieved with morphine. Dr. J called, orders received.

## 2018-04-04 NOTE — Other (Signed)
MD is in rm with pt.

## 2018-04-04 NOTE — Progress Notes (Signed)
Pt AOx4 c/o pain. Morphine given. Pt resting comfotbaly. No complaints at this time.

## 2018-04-04 NOTE — Other (Signed)
TRANSFER - OUT REPORT:    Verbal report given to Danai (name) on Rhylan L Palmiter  being transferred to Bed 7 (unit) for routine progression of care       Report consisted of patient???s Situation, Background, Assessment and   Recommendations(SBAR).     Information from the following report(s) SBAR was reviewed with the receiving nurse.    Lines:       Opportunity for questions and clarification was provided.      Patient transported with:   [employer]

## 2018-04-05 LAB — CBC WITH AUTO DIFFERENTIAL
Basophils %: 0.2 % (ref 0–3)
Eosinophils %: 0.7 % (ref 0–5)
Hematocrit: 29 % — ABNORMAL LOW (ref 37.0–50.0)
Hemoglobin: 9.5 gm/dl — ABNORMAL LOW (ref 13.0–17.2)
Immature Granulocytes: 0.5 % (ref 0.0–3.0)
Lymphocytes %: 16.9 % — ABNORMAL LOW (ref 28–48)
MCH: 31.1 pg (ref 25.4–34.6)
MCHC: 32.8 gm/dl (ref 30.0–36.0)
MCV: 95.1 fL (ref 80.0–98.0)
MPV: 11 fL — ABNORMAL HIGH (ref 6.0–10.0)
Monocytes %: 11.3 % (ref 1–13)
Neutrophils %: 70.4 % — ABNORMAL HIGH (ref 34–64)
Nucleated RBCs: 0 (ref 0–0)
Platelets: 274 10*3/uL (ref 140–450)
RBC: 3.05 M/uL — ABNORMAL LOW (ref 3.60–5.20)
RDW-SD: 56.4 — ABNORMAL HIGH (ref 36.4–46.3)
WBC: 8.3 10*3/uL (ref 4.0–11.0)

## 2018-04-05 LAB — COMPREHENSIVE METABOLIC PANEL
ALT: 12 U/L (ref 12–78)
AST: 16 U/L (ref 15–37)
Albumin: 2.3 gm/dl — ABNORMAL LOW (ref 3.4–5.0)
Alkaline Phosphatase: 114 U/L (ref 45–117)
Anion Gap: 6 mmol/L (ref 5–15)
BUN: 3 mg/dl — ABNORMAL LOW (ref 7–25)
CO2: 23 mEq/L (ref 21–32)
Calcium: 8.4 mg/dl — ABNORMAL LOW (ref 8.5–10.1)
Chloride: 107 mEq/L (ref 98–107)
Creatinine: 0.5 mg/dl — ABNORMAL LOW (ref 0.6–1.3)
EGFR IF NonAfrican American: >60
GFR African American: >60
Glucose: 78 mg/dl (ref 74–106)
Potassium: 3.6 mEq/L (ref 3.5–5.1)
Sodium: 136 mEq/L (ref 136–145)
Total Bilirubin: 0.4 mg/dl (ref 0.2–1.0)
Total Protein: 6.5 gm/dl (ref 6.4–8.2)

## 2018-04-05 LAB — POCT GLUCOSE: POC Glucose: 99 mg/dL (ref 65–105)

## 2018-04-05 LAB — FOLATE
Folate: 14.8 ng/ml (ref 3.1–17.5)
Folate: 14.8 ng/ml (ref 3.1–17.5)

## 2018-04-05 LAB — FERRITIN
Ferritin: 209.5 ng/ml (ref 8.0–252.0)
Ferritin: 209.5 ng/ml (ref 8.0–252.0)

## 2018-04-05 LAB — IRON
Iron: 17 ug/dL — ABNORMAL LOW (ref 50–170)
Iron: 17 ug/dL — ABNORMAL LOW (ref 50–170)

## 2018-04-05 LAB — VITAMIN B12
VITAMIN B12: 1725 pg/ml — ABNORMAL HIGH (ref 193–986)
Vitamin B12: 1725 pg/ml — ABNORMAL HIGH (ref 193–986)

## 2018-04-05 LAB — TIBC
TIBC: 184 ug/dL — ABNORMAL LOW (ref 250–450)
TIBC: 184 ug/dL — ABNORMAL LOW (ref 250–450)

## 2018-04-05 LAB — METABOLIC PANEL, COMPREHENSIVE
ALT (SGPT): 12 U/L (ref 12–78)
AST (SGOT): 16 U/L (ref 15–37)
Albumin: 2.3 gm/dl — ABNORMAL LOW (ref 3.4–5.0)
Alk. phosphatase: 114 U/L (ref 45–117)
Anion gap: 6 mmol/L (ref 5–15)
BUN: 3 mg/dl — ABNORMAL LOW (ref 7–25)
Bilirubin, total: 0.4 mg/dl (ref 0.2–1.0)
CO2: 23 mEq/L (ref 21–32)
Calcium: 8.4 mg/dl — ABNORMAL LOW (ref 8.5–10.1)
Chloride: 107 mEq/L (ref 98–107)
Creatinine: 0.5 mg/dl — ABNORMAL LOW (ref 0.6–1.3)
GFR est AA: >60
GFR est non-AA: >60
Glucose: 78 mg/dl (ref 74–106)
Potassium: 3.6 mEq/L (ref 3.5–5.1)
Protein, total: 6.5 gm/dl (ref 6.4–8.2)
Sodium: 136 mEq/L (ref 136–145)

## 2018-04-05 LAB — CBC WITH AUTOMATED DIFF
BASOPHILS: 0.2 % (ref 0–3)
EOSINOPHILS: 0.7 % (ref 0–5)
HCT: 29 % — ABNORMAL LOW (ref 37.0–50.0)
HGB: 9.5 gm/dl — ABNORMAL LOW (ref 13.0–17.2)
IMMATURE GRANULOCYTES: 0.5 % (ref 0.0–3.0)
LYMPHOCYTES: 16.9 % — ABNORMAL LOW (ref 28–48)
MCH: 31.1 pg (ref 25.4–34.6)
MCHC: 32.8 gm/dl (ref 30.0–36.0)
MCV: 95.1 fL (ref 80.0–98.0)
MONOCYTES: 11.3 % (ref 1–13)
MPV: 11 fL — ABNORMAL HIGH (ref 6.0–10.0)
NEUTROPHILS: 70.4 % — ABNORMAL HIGH (ref 34–64)
NRBC: 0 (ref 0–0)
PLATELET: 274 10*3/uL (ref 140–450)
RBC: 3.05 M/uL — ABNORMAL LOW (ref 3.60–5.20)
RDW-SD: 56.4 — ABNORMAL HIGH (ref 36.4–46.3)
WBC: 8.3 10*3/uL (ref 4.0–11.0)

## 2018-04-05 LAB — GLUCOSE, POC: Glucose (POC): 99 mg/dL (ref 65–105)

## 2018-04-05 MED ORDER — LACTATED RINGERS IV
INTRAVENOUS | Status: DC
Start: 2018-04-05 — End: 2018-04-08
  Administered 2018-04-05 – 2018-04-08 (×4): via INTRAVENOUS

## 2018-04-05 MED ORDER — HYDROMORPHONE 1 MG/ML INJECTION SOLUTION
1 mg/mL | INTRAMUSCULAR | Status: DC | PRN
Start: 2018-04-05 — End: 2018-04-12
  Administered 2018-04-05 – 2018-04-12 (×33): via INTRAVENOUS

## 2018-04-05 MED FILL — HYDROMORPHONE 1 MG/ML INJECTION SOLUTION: 1 mg/mL | INTRAMUSCULAR | Qty: 1

## 2018-04-05 MED FILL — LACTATED RINGERS IV: INTRAVENOUS | Qty: 1000

## 2018-04-05 MED FILL — MORPHINE 2 MG/ML INJECTION: 2 mg/mL | INTRAMUSCULAR | Qty: 1

## 2018-04-05 MED FILL — PANTOPRAZOLE 40 MG IV SOLR: 40 mg | INTRAVENOUS | Qty: 40

## 2018-04-05 MED FILL — ONDANSETRON (PF) 4 MG/2 ML INJECTION: 4 mg/2 mL | INTRAMUSCULAR | Qty: 2

## 2018-04-05 MED FILL — VITAMIN B-12 500 MCG TABLET: 500 mcg | ORAL | Qty: 1

## 2018-04-05 NOTE — Progress Notes (Signed)
Problem: Falls - Risk of  Goal: *Absence of Falls  Description  Document Ashley [name] Fall Risk and appropriate interventions in the flowsheet.  Note: Fall Risk Interventions:  Mobility Interventions: Patient to call before getting OOB         Medication Interventions: Patient to call before getting OOB, Teach patient to arise slowly  Tigr video also played for the patient.

## 2018-04-05 NOTE — Progress Notes (Signed)
 Problem: Falls - Risk of  Goal: *Absence of Falls  Description: Document Ashley [name] Fall Risk and appropriate interventions in the flowsheet.  Outcome: Progressing Towards Goal  Note: Fall Risk Interventions:  Mobility Interventions: Assess mobility with egress test         Medication Interventions: Assess postural VS orthostatic hypotension

## 2018-04-05 NOTE — Progress Notes (Signed)
Bedside and Verbal shift change report given to Patrice [place] (RN) (oncoming nurse) by [place] (RN) (offgoing nurse). Report included the following information SBAR, Kardex, Intake/Output and MAR.

## 2018-04-05 NOTE — Consults (Signed)
Consults  by Myrla Halstedixon, Angeni Chaudhuri D, NP at 04/05/18 1458                Author: Jefm Pettyixon, Adelayde Minney D, NP  Service: Gastroenterology  Author Type: Nurse Practitioner       Filed: 04/05/18 1634  Date of Service: 04/05/18 1458  Status: Attested Addendum          Editor: Jefm Pettyixon, Alazia Crocket D, NP (Nurse Practitioner)       Related Notes: Original Note by Jefm Pettyixon, Abenezer Odonell D, NP (Nurse Practitioner) filed at 04/05/18  1619          Cosigner: Robby Sermoniongco, Felix P, MD at 04/05/18 1814          Attestation signed by Robby Sermoniongco, Felix P, MD at 04/05/18 1814          I have seen and examined the patient.  I have reviewed the mid-level documentation, agree with the documentation, medical decision making and treatment plan  as outlined by the mid-level provider.  She had RUQ and epigastric tenderness and guarding on examination but no rebound.  She was no jaundiced.  Suspect acute idiopathic pancreatitis causing reactive duodenal wall thickening or duodenitis versus a penetrating  ulcer.  I will attempt and EGD and enteroscopic evaluation of her JJ anastomosis and duodenal limb tomorrow.  MRI and MRCP to assess the liver lesion seen on CT and to check for CBD stones.                                            Gastroenterology Consult          Patient: Ashley [name]  MRN: [id number]   SSN: [id number]          Date of Birth: 17-Feb-1986   Age: 33 y.o.   Sex: female           Assessment:     --Epigastric abdominal pain DDX anastomotic ulcer/penetrating ulcer/PUD, erosive esophagitis, reflux esophagitis, gastritis, duodenitis, pancreatitis,  cannot fully exclude neoplasm    -CT reveals postsurgical changes related to gastric bypass.  There is thickening of the horizontal portion of the duodenum with surrounding free fluid and inflammatory change.  There is inferior to the use in a process of the pancreas and inflammation.  No definite free air. No dilated loops of bowel. No radiopaque gallstones.   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   --Ill-defined 2.8 cm hypoenhancing lesion within the left medial hepatic lobe adjacent to the falciform ligament   --Fatty infiltration of the liver CT   --Family hx of Ulcerative Colitis-Mother   --Family hx Colon Cancer-Maternal Grandfather   --Bilateral lower lobe infiltrates on CT Defer to primary     Plan:     --Monitor CBC, BMP, periodic LFTs and lipase   --Will order iron studies, B12 and folate   --Continue Protonix 40 mg twice daily   --Will order MRI/MRCP to further evaluate pancreas, liver lesion and biliary ductal system   --On call for tentative EGD attempt enteroscopy of the JJ anastomosis and native duodenum 04/06/2018   --Start LR at 75 mL's per hour for now.  Monitor abdominal exam   --Pending above results patient may require bariatric surgical evaluation   --Avoid tobacco use-smoke cessation     Subjective:         Ashley [name]  is a 33 y.o. female with a history of Roux-en-Y gastric bypass  who is being seen for worsening epigastric abdominal pain.  The patient was admitted 04/04/2018 following recent outside hospital admission for probable pancreatitis and duodenitis.  CT findings as noted  above.  She reports intermittent fevers as high as 102 just prior to hospitalization.  She denies chills.  She denies shortness of breath, chest pain dysphagia or odynophagia.  She reports intermittent dry heaves with vomiting of small volumes of bilious  liquid. She denies hematemesis. She reports a decreased appetite but denies weight loss.  Her normal bowel pattern has changed and alternates between soft to loose mucousy stools with her last BM day before yesterday.  She denies hematochezia or melena.  Patient reports occasional EtOH use with her last alcoholic beverage around the Christmas holidays she states. She reports history of smoking but states that she quit approximately 2 months ago. She denies NSAID or anticoagulant use.  Patient reports  prior history of  EGD and [place] which she states was unremarkable.  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  She states she has had a total of 4 C-sections but otherwise denies other abdominal surgeries.   She denies recent travel or sick contacts.  She reports a family history of ulcerative colitis her mom and her maternal grandfather with colon cancer. she denies any other complaints at this time.         Hospital Problems   Date Reviewed:  12/17/2010                         Codes  Class  Noted  POA              Gastritis  ICD-10-CM: K29.70   ICD-9-CM: 535.50    04/04/2018  Unknown                         Past Medical History:        Diagnosis  Date         ?  Ill-defined condition            bronchitis         ?  Pneumonia            Past Surgical History:         Procedure  Laterality  Date          ?  HX CESAREAN SECTION    2002, 2004, 2008          x 3          ?  HX CESAREAN SECTION         ?  [facility]    [facility]          [facility]          ?  HX GASTRIC BYPASS              ?  HX GASTRIC BYPASS               Family History         Problem  Relation  Age of Onset          ?  Diabetes  Mother       ?  Hypertension  Father       ?  Heart Disease  Maternal Grandmother       ?  Heart Attack  Maternal Grandmother       ?  High Cholesterol  Maternal Grandmother       ?  Arthritis-osteo  Maternal Grandmother       ?  Stroke  Paternal Aunt            ?  Kidney Disease  Maternal Aunt            Social History          Tobacco Use         ?  Smoking status:  Former Smoker              Types:  Cigarettes         Last attempt to quit:  11/12/2009         Years since quitting:  8.4         ?  Smokeless tobacco:  Never Used       Substance Use Topics         ?  Alcohol use:  Not Currently              Alcohol/week:  0.0 - 4.0 standard drinks             Comment: occassional           Current Facility-Administered Medications             Medication  Dose  Route  Frequency   Provider  Last Rate  Last Dose              ?  cyanocobalamin (VITAMIN B12) tablet 500 mcg   500 mcg  Oral  DAILY  Irving Burton, MD     500 mcg at 04/05/18 0841     ?  dicyclomine (BENTYL) capsule 10 mg   10 mg  Oral  Q6H PRN  Irving Burton, MD           ?  acetaminophen (TYLENOL) tablet 650 mg   650 mg  Oral  Q6H PRN  Jasarevic, Muhamed, MD           ?  sodium chloride (NS) flush 5-10 mL   5-10 mL  IntraVENous  Q8H  Jasarevic, Muhamed, MD     10 mL at 04/05/18 1420     ?  sodium chloride (NS) flush 5-10 mL   5-10 mL  IntraVENous  PRN  Irving Burton, MD           ?  naloxone (NARCAN) injection 0.1 mg   0.1 mg  IntraVENous  PRN  Irving Burton, MD           ?  ondansetron (ZOFRAN) injection 4 mg   4 mg  IntraVENous  Q6H PRN  Irving Burton, MD     4 mg at 04/05/18 0841     ?  pantoprazole (PROTONIX) 40 mg in 0.9% sodium chloride 10 mL injection   40 mg  IntraVENous  Q12H  Irving Burton, MD     40 mg at 04/05/18 0841              ?  HYDROmorphone (DILAUDID) injection 0.5 mg   0.5 mg  IntraVENous  Q4H PRN  Irving Burton, MD     0.5 mg at 04/05/18 1152           Allergies        Allergen  Reactions         ?  Aspirin  Other (comments)  Stomach ulcer         ?  Ibuprofen  Unknown (comments)             Gastric Bypass--advised to avoid         ?  Nsaids (Non-Steroidal Anti-Inflammatory Drug)  Other (comments)             Hx gastric bypass        Review of Systems:   A 12 point review of systems were obtained with pertinent positives noted in above HPI, otherwise negative         Objective:        Patient Vitals for the past 24 hrs:            Temp  Pulse  Resp  BP  SpO2            04/05/18 1139  99.2 ??F (37.3 ??C)  91  18  121/67  99 %            04/05/18 0759  99.4 ??F (37.4 ??C)  91  18  116/72  94 %     04/05/18 0659  98.7 ??F (37.1 ??C)  96  18  111/74  97 %     04/05/18 0516  98.8 ??F (37.1 ??C)  93  16  131/75  99 %     04/05/18 0504  --  --  --  124/76  99 %     04/05/18 0403   --  --  --  (!) 116/98  --     04/05/18 0303  --  --  --  121/78  98 %     04/05/18 0103  --  --  --  (!) 118/92  98 %     04/05/18 0018  98.9 ??F (37.2 ??C)  90  16  123/74  100 %     04/05/18 0001  --  --  --  106/64  96 %     04/04/18 2302  --  --  --  99/50  99 %     04/04/18 2048  --  --  --  120/80  99 %     04/04/18 2011  99.6 ??F (37.6 ??C)  91  18  117/68  100 %     04/04/18 1902  --  --  --  103/63  99 %            04/04/18 1834  99.6 ??F (37.6 ??C)  90  18  128/58  99 %        Physical Exam:   GENERAL: alert, pleasant, cooperative, no distress, appears  stated age   EYE:  No scleral icterus noted    LUNG: clear to auscultation bilaterally   HEART: S1, S2    ABDOMEN: soft, gastric and right upper quadrant tenderness appreciated on palpation, no guarding, b owel sounds +, No palpable  masses, hernias or organomegaly appreciated    EXTREMITIES: no edema , Pulses 2+    SKIN: no rash or abnormalities   NEUROLOGIC: AOx3      Diagnostic Imaging:   CT of the abdomen and pelvis 04/03/18   ??   INDICATION:  RUQ pain.       COMPARISON: No relevant studies.   ??   TECHNIQUE: CT of the abdomen and pelvis was performed with nonionic intravenous   contrast with coronal and sagittal reformatted images.   ??   DICOM format image data is available to non-affiliated  external healthcare   facilities or entities on a secure, media free, reciprocally searchable basis   with patient authorization for 12 months following the date of the study.   ??   FINDINGS:   ??   LOWER THORAX: There are bilateral lower lobe infiltrates, left greater than   right. Additional areas of subsegmental atelectasis.   ??   ABDOMEN: There is no free air or free fluid. There is a slightly ill-defined 2.8   cm hypoenhancing lesion within the left medial hepatic lobe adjacent to the   falciform ligament for which MRI of the abdomen with and without contrast is   suggested. There is fatty infiltration of the liver. There are no radiopaque   gallstones. The adrenal  glands are unremarkable. There is no hydronephrosis or   nephrolithiasis.   ??   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 There is thickening of   the horizontal portion of the duodenum with surrounding free fluid and   inflammatory change. This is inferior to the uncinate process of the pancreas   and inflammation likely secondary to duodenal inflammation. Lipase normal on   chart review. No definite free air. No dilated loops of bowel.        ??   PELVIS: There is no focal bladder wall thickening. The uterus is present.   Negative for aortic dissection.   ??   OSSEOUS STRUCTURES: Acutely intact.   ??   IMPRESSION:   1.  Thickening of the horizontal portion of duodenum with surrounding   inflammatory changes. Findings likely reflect duodenitis. Superimposed ulcer not   excluded. While inflammation is near the pancreas, lipase is normal in chart   review.   2.  Fatty infiltration of the liver. 2.8 cm indeterminate lesion within the left   medial hepatic lobe. MRI with and without contrast recommended.   3.  Bibasilar infiltrates/atelectasis.   ??   Laboratory Results:         Labs:  Results:        Chemistry  Recent Labs         04/05/18   0352  04/03/18   1850      GLU  78  88      NA  136  138      K  3.6  3.6      CL  107  107      CO2  23  26      BUN  3*  4*      CREA  0.5*  0.6      CA  8.4*  8.9      AGAP  6  5      AP  114  129*      TP  6.5  7.4      ALB  2.3*  2.8*          Estimated Creatinine Clearance: 113.5 mL/min (A) (by C-G formula based on SCr of 0.5 mg/dL (L)).     CBC w/Diff  Recent Labs         04/05/18   0352  04/03/18   1850      WBC  8.3  10.3      RBC  3.05*  3.41*      HGB  9.5*  10.8*      HCT  29.0*  32.9*      PLT  274  275      GRANS  70.4*  79.3*      LYMPH  16.9*  12.4*      EOS  0.7  0.7              Cardiac Enzymes  No results for input(s): CPK, CKND1, MYO in the last 72 hours.      No lab exists for component: CKRMB, TROIP     Coagulation  No results for input(s): PTP,  INR, APTT, INREXT in the last 72 hours.         Hepatitis Panel  Lab Results      Component  Value  Date/Time        Hep B surface Ag screen  Negative  12/20/2010 12:00 AM            Recent Labs         04/05/18   0352  04/03/18   1850      ALT  12  15      SGOT  16  18      TBILI  0.4  0.5      AP  114  129*      ALB  2.3*  2.8*      TP  6.5  7.4                       Amylase Lipase          Liver Enzymes  Recent Labs         04/05/18   0352      TP  6.5      ALB  2.3*      AP  114      SGOT  16      ALT  12                 Thyroid Studies  No results for input(s): T4, T3U, TSH, TSHEXT in the last 72 hours.      No lab exists for component: T3RU          Pathology  pathology           Signed By:  Myrla Halsted MS, APRN, FNP-C       April 05, 2018           Office: [phone]

## 2018-04-05 NOTE — Progress Notes (Signed)
INTERNAL MEDICINE PROGRESS NOTE  Patient: Ashley [name]   Date of Birth: May 06, 1985   MRN: [id number]        Assessment / The Hospital course:     Principle Problems:  RUQ , Epigastric pain radiating to the back, intermittent   Gastritis and duodenitis   2.8 cm indeterminate lesion within the left medial hepatic lobe  - presented with RUQ , Epigastric pain radiating to the back, intermittent , associated with N/V , lipase normal  - she diagnosed with pancreatitis few days ago, Lipase normal here was 446 on 1/19, pancrease normal on CT   - on admission afebrile, vitals stable, blood workup is unremarkable, CT abd showed Duodenitis   - pain control, PPI , GI consult  ??  Code Status: Full code   DVT prophylaxis: pharmacologic and mechanical  Disposition : home   ??    Today Plan:   GI to see  Continue with PPI  And low fat diet       Subjective:   Patient currently denies new complain, no N/V     Medical Decision Making   Chart, Images and Lab data reviewed, necessary medical Orders placed   Discussed with nursing staff and Case Manager. .     Vitals:    04/05/18 6629 04/05/18 0516 04/05/18 0659 04/05/18 0759   BP: 124/76 131/75 111/74 116/72   Pulse:  93 96 91   Resp:  _0 Temp:  98.8 ??F (37.1 ??C) 98.7 ??F (37.1 ??C) 99.4 ??F (37.4 ??C)   SpO2: 99% 99% 97% 94%   Weight:       Height:         Temp (24hrs), Avg:99.2 ??F (37.3 ??C), Min:98.7 ??F (37.1 ??C), Max:99.6 ??F (37.6 ??C)    No intake or output data in the 24 hours ending 04/05/18 0903    Physical Exam:   General Appearance:   Appears in no acute distress.,   Skin:   Skin warm & dry, No rash, No jaundice,   Lymph:  There is no lymphadenopathy,   HEENT:   PERRLA, EOMI, Moist oral mucous membranes, conjunctiva clear,   Neck:   Supple, Without masses,   Lungs:   Clear, No wheezes., No rales., Normal respiratory effort,   Heart:   Regular rate and rhythm, No gallop,   Abdomen:   Soft , Non-distended, Normal bowel sounds and Non-tender,   Extremities:   No edema of  legs, Normal pedal and radial pulses,   Neuro:   alert, oriented, affect appropriate, speech fluent, cranial nerves intact, no focal neurological deficits and moves all extremities well    Current medications:     Current Facility-Administered Medications   Medication Dose Route Frequency   ??? cyanocobalamin (VITAMIN B12) tablet 500 mcg  500 mcg Oral DAILY   ??? dicyclomine (BENTYL) capsule 10 mg  10 mg Oral Q6H PRN   ??? acetaminophen (TYLENOL) tablet 650 mg  650 mg Oral Q6H PRN   ??? sodium chloride (NS) flush 5-10 mL  5-10 mL IntraVENous Q8H   ??? sodium chloride (NS) flush 5-10 mL  5-10 mL IntraVENous PRN   ??? naloxone (NARCAN) injection 0.1 mg  0.1 mg IntraVENous PRN   ??? ondansetron (ZOFRAN) injection 4 mg  4 mg IntraVENous Q6H PRN   ??? pantoprazole (PROTONIX) 40 mg in 0.9% sodium chloride 10 mL injection  40 mg IntraVENous Q12H   ??? HYDROmorphone (DILAUDID) injection 0.5 mg  0.5 mg IntraVENous  Q4H PRN          Laboratory and Radiology Data :     Recent Results (from the past 24 hour(s))   CBC WITH AUTOMATED DIFF    Collection Time: 04/05/18  3:52 AM   Result Value Ref Range    WBC 8.3 4.0 - 11.0 1000/mm3    RBC 3.05 (L) 3.60 - 5.20 M/uL    HGB 9.5 (L) 13.0 - 17.2 gm/dl    HCT 29.0 (L) 37.0 - 50.0 %    MCV 95.1 80.0 - 98.0 fL    MCH 31.1 25.4 - 34.6 pg    MCHC 32.8 30.0 - 36.0 gm/dl    PLATELET 274 140 - 450 1000/mm3    MPV 11.0 (H) 6.0 - 10.0 fL    RDW-SD 56.4 (H) 36.4 - 46.3      NRBC 0 0 - 0      IMMATURE GRANULOCYTES 0.5 0.0 - 3.0 %    NEUTROPHILS 70.4 (H) 34 - 64 %    LYMPHOCYTES 16.9 (L) 28 - 48 %    MONOCYTES 11.3 1 - 13 %    EOSINOPHILS 0.7 0 - 5 %    BASOPHILS 0.2 0 - 3 %   METABOLIC PANEL, COMPREHENSIVE    Collection Time: 04/05/18  3:52 AM   Result Value Ref Range    Sodium 136 136 - 145 mEq/L    Potassium 3.6 3.5 - 5.1 mEq/L    Chloride 107 98 - 107 mEq/L    CO2 23 21 - 32 mEq/L    Glucose 78 74 - 106 mg/dl    BUN 3 (L) 7 - 25 mg/dl    Creatinine 0.5 (L) 0.6 - 1.3 mg/dl    GFR est AA >60.0      GFR est non-AA  >60      Calcium 8.4 (L) 8.5 - 10.1 mg/dl    AST (SGOT) 16 15 - 37 U/L    ALT (SGPT) 12 12 - 78 U/L    Alk. phosphatase 114 45 - 117 U/L    Bilirubin, total 0.4 0.2 - 1.0 mg/dl    Protein, total 6.5 6.4 - 8.2 gm/dl    Albumin 2.3 (L) 3.4 - 5.0 gm/dl    Anion gap 6 5 - 15 mmol/L   GLUCOSE, POC    Collection Time: 04/05/18  7:01 AM   Result Value Ref Range    Glucose (POC) 99 65 - 105 mg/dL       XR Results:  Results from Hospital Encounter encounter on 01/20/18   XR KNEE RT MIN 4 V    Narrative Indication: 79 without injury    4 views right knee show no bony or soft tissue abnormality.      Impression IMPRESSION: Normal study.         CT Results:  Results from [place] encounter on 04/03/18   CT ABD PELV W CONT    Narrative CT of the abdomen and pelvis     INDICATION:  RUQ pain.      COMPARISON: No relevant studies.    TECHNIQUE: CT of the abdomen and pelvis was performed with nonionic intravenous  contrast with coronal and sagittal reformatted images.    DICOM format image data is available to non-affiliated external healthcare  facilities or entities on a secure, media free, reciprocally searchable basis  with patient authorization for 12 months following the date of the study.    FINDINGS:    LOWER  THORAX: There are bilateral lower lobe infiltrates, left greater than  right. Additional areas of subsegmental atelectasis.    ABDOMEN: There is no free air or free fluid. There is a slightly ill-defined 2.8  cm hypoenhancing lesion within the left medial hepatic lobe adjacent to the  falciform ligament for which MRI of the abdomen with and without contrast is  suggested. There is fatty infiltration of the liver. There are no radiopaque  gallstones. The adrenal glands are unremarkable. There is no hydronephrosis or  nephrolithiasis.    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 There is thickening of  the horizontal portion of the duodenum with surrounding free fluid and  inflammatory change.  This is inferior to the uncinate process of the pancreas  and inflammation likely secondary to duodenal inflammation. Lipase normal on  chart review. No definite free air. No dilated loops of bowel.         PELVIS: There is no focal bladder wall thickening. The uterus is present.  Negative for aortic dissection.    OSSEOUS STRUCTURES: Acutely intact.      Impression IMPRESSION:    1.  Thickening of the horizontal portion of duodenum with surrounding  inflammatory changes. Findings likely reflect duodenitis. Superimposed ulcer not  excluded. While inflammation is near the pancreas, lipase is normal in chart  review.  2.  Fatty infiltration of the liver. 2.8 cm indeterminate lesion within the left  medial hepatic lobe. MRI with and without contrast recommended.  3.  Bibasilar infiltrates/atelectasis.         MRI Results:  No results found for this or any previous visit.    Nuclear Medicine Results:  No results found for this or any previous visit.    [place] Results:  No results found for this or any previous visit.    IR Results:  No results found for this or any previous visit.    VAS/US Results:  Results from Hospital Encounter encounter on 01/20/18   DUPLEX LOWER EXT VENOUS RIGHT    Narrative                                                               Study ID:   [id number]                                                 [facility]                                            [id number] [place],                                          [place]                                   Lower Extremity Venous Report    Name: HAVANAH, NELMS Date: 01/20/2018 08:00 PM  MRN: [id number]                      Patient Location:  IFO^277^[id number]^NOMV  DOB: 14-Feb-1986                  Age: 29 yrs  Gender: Female                   Account #: [id number]  Reason For Study: Right leg pain  Ordering Physician: Johnella Moloney    Performed By: Lowella Dell, RVS    Interpretation Summary  No evidence of superficial or deep vein thrombosis noted in the right lower  extremity.  No evidence of deep venous thrombosis in the contralateral/left common   femoral  vein.  _____________________________________________________________________________  __      QUALITY/PROCEDURE  Limited unilateral venous duplex performed of the right leg. Quality of the  study is good. M79.604.    HISTORY/SYMPTOMS  Right leg pain.    RIGHT LEG  The common femoral, femoral, popliteal, posterior tibial and peroneal veins  were examined with duplex ultrasound. The deep veins were patent and  compressible with no evidence of intraluminal thrombus. Spontaneous, phasic  venous flow with normal augmentation noted throughout the right leg.    RIGHT SAPHENOUS VEINS  Spectral Doppler exam demonstrates normal venous flow in the right great  saphenous vein. Compression of the right great saphenous vein is complete.  Filling defects in the right great saphenous vein are absent.      LEFT LEG  Spectral Doppler exam demonstrates normal venous flow in the left common  femoral vein. Compression of the left common femoral vein is complete.   Filling  defects in the left common femoral vein are absent.      Electronically signed byDR Wenda Low, MD   01/21/2018 01:10 AM       Admission HPI:           Christy Sartorius M.D.  [facility]  Page [phone]

## 2018-04-05 NOTE — Progress Notes (Signed)
TRANSFER - IN REPORT:    Verbal report received from [place] W (RN)(name) on [employer]  being received from ED(unit) for routine progression of care      Report consisted of patient's Situation, Background, Assessment and   Recommendations(SBAR).     Information from the following report(s) SBAR, Kardex, Intake/Output, MAR and Recent Results was reviewed with the receiving nurse.    Opportunity for questions and clarification was provided.      Assessment completed upon patient's arrival to unit and care assumed.

## 2018-04-05 NOTE — Progress Notes (Signed)
 Patient admitted on 04/03/2018 from home with   Chief Complaint   Patient presents with   . Abdominal Pain          The patient is being treated for    PMH:   Past Medical History:   Diagnosis Date   . Ill-defined condition     bronchitis   . Pneumonia         Treatment Team: Treatment Team: Attending Provider: [place] , Toya HERO, MD; Consulting Provider: Lillis Arlington, MD; Hospitalist: [place] , Toya HERO, MD; Primary Nurse: Philis Fang, RN      The patient has been admitted to the hospital 0 times in the past 12 months.    Previous 4 Admission Dates Admission and Discharge Diagnosis Interventions Barriers Disposition                                 Patient and Family/Caregivers Goals of Care: home    Caregivers Participating in Plan of Care/Discharge Plan with the patient: no    Tentative dc plan: home    Anticipated DME needs for discharge: none    Does the patient have appropriate clothing available to be worn at discharge?  yes      The patient and care participants are willing to travel n area for discharge facility.  The patient and plan of care participants have been provided with a list of all available Rehab Facilities or Home Health agencies as applicable. CM will follow up with a list of facilities or agencies that are offer acceptance.    CM has disclosed any financial interest that [facility] may have with any facility or agency.    Anticipated Discharge Date: 04/09/18    The plan of care and discharge plan has been discussed with [place] , Toya HERO, MD and all other appropriate providers and adjusted per interdisciplinary team recommendations and in discussion with the patient and the patient designated Care Plan Participants.    Barriers to Healthcare Success/ Readmission Risk Factors: n    Consults:  Palliative Care Consult Recommended: n  Transitional Care Clinic Referral: n  Transitional Nurse Navigator Referral: n  Oncology Navigator Referral: n  SW consulted: n  [facility] Consulted: n  Outside Hospital/Community Resources Referrals and Collaboration: n    Food/Nutrition Needs:   n                   Dietician Consulted: n    RRAT Score: Low Risk            4       Total Score        4 Pt. Coverage (Medicare=5 , Medicaid, or Self-Pay=4)        Criteria that do not apply:    Has Seen PCP in Last 6 Months (Yes=3, No=0)    Married. Living with Significant Other. Assisted Living. LTAC. SNF. or   Rehab    Patient Length of Stay (>5 days = 3)    IP Visits Last 12 Months (1-3=4, 4=9, >4=11)    Charlson Comorbidity Score (Age + Comorbid Conditions)           PCP: Laurita, Da, MD . How do you get to your doctor appointments?    Specialists:     Dialysis Unit: n    [facility] Are there any medications that you have trouble paying for? Any difficulty getting your medications?  DME available at Home:none    Home O2 L Flow:  n            Home O2 Provider: n    Home Environment and Prior Level of Function: Lives at [address]  [place] [address] @HOMEPHONE @. Lives with sister.  Multistory or 1 story.  Steps into home.  Responsibilities at home include n    Prior to admission open services: none    Home Health Agency-  Personal Care Agency-    Extended Emergency Contact Information  Primary Emergency Contact: Harrell,Derrick   [place]  Home Phone: [phone]  Mobile Phone: [phone]  Relation: Boyfriend     Transportation: sister will transport home    Therapy Recommendations:    OT = n    PT = n    SLP =  n     RT Home O2 Evaluation =  n    Wound Care =  n    Case Management Assessment    ABUSE/NEGLECT SCREENING   Physical Abuse/Neglect: Denies   Sexual Abuse: Denies   Sexual Abuse: Denies   Other Abuse/Issues: Denies          PRIMARY DECISION MAKER                                   CARE MANAGEMENT INTERVENTIONS   Readmission Interview Completed: Not Applicable   PCP Verified by CM: Yes           Mode of Transport at Discharge: Other (see  comment)(sister)                   MyChart Signup: No   Discharge Durable Medical Equipment: No   Physical Therapy Consult: No   Occupational Therapy Consult: No   Speech Therapy Consult: No   Current Support Network: Lives with Caregiver(kids)       History Provided By: Patient   Patient Orientation: Alert and Oriented   Cognition: Alert   Support System Response: Concerned   Previous Living Arrangement: Lives Alone Independent   Home Accessibility: Steps(2)   Prior Functional Level: Independent in ADLs/IADLs   Current Functional Level: Independent in ADLs/IADLs       Can patient return to prior living arrangement: Yes   Ability to make needs known:: Good   Family able to assist with home care needs:: Yes               Types of Needs Identified: Disease Management Education, Treatment Education       Confirm Follow Up Transport: Family(sister)   Conservation officer, nature and Arrange: Yes              DISCHARGE LOCATION   Discharge Placement: Home

## 2018-04-05 NOTE — Progress Notes (Signed)
INTERNAL MEDICINE PROGRESS NOTE  Patient: Ashley [name]   Date of Birth: Jul 02, 1985   MRN: [id number]        Assessment / The Hospital course:     Principle Problems:  RUQ , Epigastric pain radiating to the back, intermittent   Gastritis and duodenitis   2.8 cm indeterminate lesion within the left medial hepatic lobe  - presented with RUQ , Epigastric pain radiating to the back, intermittent , associated with N/V , lipase normal  - she diagnosed with pancreatitis few days ago, Lipase normal here was 446 on 1/19, pancrease normal on CT   - on admission afebrile, vitals stable, blood workup is unremarkable, CT abd showed Duodenitis   - pain control, PPI , GI consult  ??  Code Status: Full code   DVT prophylaxis: pharmacologic and mechanical  Disposition : home   ??    Today Plan:   GI to see  Continue with PPI  And low fat diet       Subjective:   Patient currently denies new complain, no N/V     Medical Decision Making   Chart, Images and Lab data reviewed, necessary medical Orders placed   Discussed with nursing staff and Case Manager. .     Vitals:    04/05/18 3614 04/05/18 0516 04/05/18 0659 04/05/18 0759   BP: 124/76 131/75 111/74 116/72   Pulse:  93 96 91   Resp:  _0 Temp:  98.8 ??F (37.1 ??C) 98.7 ??F (37.1 ??C) 99.4 ??F (37.4 ??C)   SpO2: 99% 99% 97% 94%   Weight:       Height:         Temp (24hrs), Avg:99.2 ??F (37.3 ??C), Min:98.7 ??F (37.1 ??C), Max:99.6 ??F (37.6 ??C)    No intake or output data in the 24 hours ending 04/05/18 0903    Physical Exam:   General Appearance:   Appears in no acute distress.,   Skin:   Skin warm & dry, No rash, No jaundice,   Lymph:  There is no lymphadenopathy,   HEENT:   PERRLA, EOMI, Moist oral mucous membranes, conjunctiva clear,   Neck:   Supple, Without masses,   Lungs:   Clear, No wheezes., No rales., Normal respiratory effort,   Heart:   Regular rate and rhythm, No gallop,   Abdomen:   Soft , Non-distended, Normal bowel sounds and Non-tender,    Extremities:   No edema of legs, Normal pedal and radial pulses,   Neuro:   alert, oriented, affect appropriate, speech fluent, cranial nerves intact, no focal neurological deficits and moves all extremities well    Current medications:     Current Facility-Administered Medications   Medication Dose Route Frequency   ??? cyanocobalamin (VITAMIN B12) tablet 500 mcg  500 mcg Oral DAILY   ??? dicyclomine (BENTYL) capsule 10 mg  10 mg Oral Q6H PRN   ??? acetaminophen (TYLENOL) tablet 650 mg  650 mg Oral Q6H PRN   ??? sodium chloride (NS) flush 5-10 mL  5-10 mL IntraVENous Q8H   ??? sodium chloride (NS) flush 5-10 mL  5-10 mL IntraVENous PRN   ??? naloxone (NARCAN) injection 0.1 mg  0.1 mg IntraVENous PRN   ??? ondansetron (ZOFRAN) injection 4 mg  4 mg IntraVENous Q6H PRN   ??? pantoprazole (PROTONIX) 40 mg in 0.9% sodium chloride 10 mL injection  40 mg IntraVENous Q12H   ??? HYDROmorphone (DILAUDID) injection 0.5 mg  0.5 mg IntraVENous  Q4H PRN          Laboratory and Radiology Data :     Recent Results (from the past 24 hour(s))   CBC WITH AUTOMATED DIFF    Collection Time: 04/05/18  3:52 AM   Result Value Ref Range    WBC 8.3 4.0 - 11.0 1000/mm3    RBC 3.05 (L) 3.60 - 5.20 M/uL    HGB 9.5 (L) 13.0 - 17.2 gm/dl    HCT 29.0 (L) 37.0 - 50.0 %    MCV 95.1 80.0 - 98.0 fL    MCH 31.1 25.4 - 34.6 pg    MCHC 32.8 30.0 - 36.0 gm/dl    PLATELET 274 140 - 450 1000/mm3    MPV 11.0 (H) 6.0 - 10.0 fL    RDW-SD 56.4 (H) 36.4 - 46.3      NRBC 0 0 - 0      IMMATURE GRANULOCYTES 0.5 0.0 - 3.0 %    NEUTROPHILS 70.4 (H) 34 - 64 %    LYMPHOCYTES 16.9 (L) 28 - 48 %    MONOCYTES 11.3 1 - 13 %    EOSINOPHILS 0.7 0 - 5 %    BASOPHILS 0.2 0 - 3 %   METABOLIC PANEL, COMPREHENSIVE    Collection Time: 04/05/18  3:52 AM   Result Value Ref Range    Sodium 136 136 - 145 mEq/L    Potassium 3.6 3.5 - 5.1 mEq/L    Chloride 107 98 - 107 mEq/L    CO2 23 21 - 32 mEq/L    Glucose 78 74 - 106 mg/dl    BUN 3 (L) 7 - 25 mg/dl    Creatinine 0.5 (L) 0.6 - 1.3 mg/dl     GFR est AA >60.0      GFR est non-AA >60      Calcium 8.4 (L) 8.5 - 10.1 mg/dl    AST (SGOT) 16 15 - 37 U/L    ALT (SGPT) 12 12 - 78 U/L    Alk. phosphatase 114 45 - 117 U/L    Bilirubin, total 0.4 0.2 - 1.0 mg/dl    Protein, total 6.5 6.4 - 8.2 gm/dl    Albumin 2.3 (L) 3.4 - 5.0 gm/dl    Anion gap 6 5 - 15 mmol/L   GLUCOSE, POC    Collection Time: 04/05/18  7:01 AM   Result Value Ref Range    Glucose (POC) 99 65 - 105 mg/dL       XR Results:  Results from Hospital Encounter encounter on 01/20/18   XR KNEE RT MIN 4 V    Narrative Indication: 79 without injury    4 views right knee show no bony or soft tissue abnormality.      Impression IMPRESSION: Normal study.         CT Results:  Results from [facility] encounter on 04/03/18   CT ABD PELV W CONT    Narrative CT of the abdomen and pelvis     INDICATION:  RUQ pain.      COMPARISON: No relevant studies.    TECHNIQUE: CT of the abdomen and pelvis was performed with nonionic intravenous  contrast with coronal and sagittal reformatted images.    DICOM format image data is available to non-affiliated external healthcare  facilities or entities on a secure, media free, reciprocally searchable basis  with patient authorization for 12 months following the date of the study.    FINDINGS:    LOWER  THORAX: There are bilateral lower lobe infiltrates, left greater than  right. Additional areas of subsegmental atelectasis.    ABDOMEN: There is no free air or free fluid. There is a slightly ill-defined 2.8  cm hypoenhancing lesion within the left medial hepatic lobe adjacent to the  falciform ligament for which MRI of the abdomen with and without contrast is  suggested. There is fatty infiltration of the liver. There are no radiopaque  gallstones. The adrenal glands are unremarkable. There is no hydronephrosis or  nephrolithiasis.    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 There is thickening of   the horizontal portion of the duodenum with surrounding free fluid and  inflammatory change. This is inferior to the uncinate process of the pancreas  and inflammation likely secondary to duodenal inflammation. Lipase normal on  chart review. No definite free air. No dilated loops of bowel.         PELVIS: There is no focal bladder wall thickening. The uterus is present.  Negative for aortic dissection.    OSSEOUS STRUCTURES: Acutely intact.      Impression IMPRESSION:    1.  Thickening of the horizontal portion of duodenum with surrounding  inflammatory changes. Findings likely reflect duodenitis. Superimposed ulcer not  excluded. While inflammation is near the pancreas, lipase is normal in chart  review.  2.  Fatty infiltration of the liver. 2.8 cm indeterminate lesion within the left  medial hepatic lobe. MRI with and without contrast recommended.  3.  Bibasilar infiltrates/atelectasis.         MRI Results:  No results found for this or any previous visit.    Nuclear Medicine Results:  No results found for this or any previous visit.    [place] Results:  No results found for this or any previous visit.    IR Results:  No results found for this or any previous visit.    VAS/US Results:  Results from Hospital Encounter encounter on 01/20/18   DUPLEX LOWER EXT VENOUS RIGHT    Narrative                                                               Study ID:   [id number]                                                 [facility]                                            [address] [place],                                          [place]                                   Lower Extremity Venous Report     Name: DHALIA, ZINGARO Date: 01/20/2018 08:00 PM  MRN: [id number]                      Patient Location: [id number]  DOB: Jan 08, 1986                  Age: 53 yrs  Gender: Female                   Account #: [id number]  Reason For Study: Right leg pain  Ordering Physician: Johnella Moloney    Performed By: Lowella Dell, RVS    Interpretation Summary  No evidence of superficial or deep vein thrombosis noted in the right lower  extremity.  No evidence of deep venous thrombosis in the contralateral/left common   femoral  vein.  _____________________________________________________________________________  __      QUALITY/PROCEDURE  Limited unilateral venous duplex performed of the right leg. Quality of the  study is good. M79.604.    HISTORY/SYMPTOMS  Right leg pain.    RIGHT LEG  The common femoral, femoral, popliteal, posterior tibial and peroneal veins  were examined with duplex ultrasound. The deep veins were patent and  compressible with no evidence of intraluminal thrombus. Spontaneous, phasic  venous flow with normal augmentation noted throughout the right leg.    RIGHT SAPHENOUS VEINS  Spectral Doppler exam demonstrates normal venous flow in the right great  saphenous vein. Compression of the right great saphenous vein is complete.  Filling defects in the right great saphenous vein are absent.      LEFT LEG  Spectral Doppler exam demonstrates normal venous flow in the left common  femoral vein. Compression of the left common femoral vein is complete.   Filling  defects in the left common femoral vein are absent.      Electronically signed byDR Wenda Low, MD   01/21/2018 01:10 AM       Admission HPI:           Christy Sartorius M.D.  [facility]  Page [phone]

## 2018-04-05 NOTE — Progress Notes (Signed)
Problem: Falls - Risk of  Goal: *Absence of Falls  Description  Document Schmid Fall Risk and appropriate interventions in the flowsheet.  Note: Fall Risk Interventions:  Mobility Interventions: Patient to call before getting OOB         Medication Interventions: Patient to call before getting OOB, Teach patient to arise slowly  Tigr video also played for the patient.

## 2018-04-05 NOTE — Other (Signed)
TRANSFER - OUT REPORT:    Verbal report given to [facility] RN(name) on Tally Due  being transferred to 5 west(unit) for routine progression of care       Report consisted of patient???s Situation, Background, Assessment and   Recommendations(SBAR).     Information from the following report(s) SBAR and Kardex was reviewed with the receiving nurse.    Lines:   Peripheral IV 04/05/18 Left Antecubital (Active)        Opportunity for questions and clarification was provided.      Patient transported with:   [employer]

## 2018-04-05 NOTE — Progress Notes (Signed)
Problem: Falls - Risk of  Goal: *Absence of Falls  Description  Document Schmid Fall Risk and appropriate interventions in the flowsheet.  Outcome: Progressing Towards Goal  Note: Fall Risk Interventions:  Mobility Interventions: Assess mobility with egress test         Medication Interventions: Assess postural VS orthostatic hypotension

## 2018-04-05 NOTE — Consults (Addendum)
Gastroenterology Consult    Patient: Ashley [name] MRN: [id number]  SSN: [id number]    Date of Birth: December 25, 1985  Age: 33 y.o.  Sex: female      Assessment:   --Epigastric abdominal pain DDX anastomotic ulcer/penetrating ulcer/PUD, erosive esophagitis, reflux esophagitis, gastritis, duodenitis, pancreatitis, cannot fully exclude neoplasm   -CT reveals postsurgical changes related to gastric bypass.  There is thickening of the horizontal portion of the duodenum with surrounding free fluid and inflammatory change.  There is inferior to the use in a process of the pancreas and inflammation. No definite free air. No dilated loops of bowel. No radiopaque gallstones.  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  --Ill-defined 2.8 cm hypoenhancing lesion within the left medial hepatic lobe adjacent to the falciform ligament  --Fatty infiltration of the liver CT  --Family hx of Ulcerative Colitis-Mother  --Family hx Colon Cancer-Maternal Grandfather  --Bilateral lower lobe infiltrates on CT Defer to primary  Plan:   --Monitor CBC, BMP, periodic LFTs and lipase  --Will order iron studies, B12 and folate  --Continue Protonix 40 mg twice daily  --Will order MRI/MRCP to further evaluate pancreas, liver lesion and biliary ductal system  --On call for tentative EGD attempt enteroscopy of the JJ anastomosis and native duodenum 04/06/2018  --Start LR at 75 mL's per hour for now.  Monitor abdominal exam  --Pending above results patient may require bariatric surgical evaluation  --Avoid tobacco use???smoke cessation  Subjective:      Ashley [name] is a 33 y.o. female with a history of Roux-en-Y gastric bypass who is being seen for worsening epigastric abdominal pain.  The patient was admitted 04/04/2018 following recent outside hospital admission for probable pancreatitis and duodenitis.  CT findings as noted above.  She reports intermittent fevers as high as 102 just prior to  hospitalization.  She denies chills.  She denies shortness of breath, chest pain dysphagia or odynophagia.  She reports intermittent dry heaves with vomiting of small volumes of bilious liquid. She denies hematemesis. She reports a decreased appetite but denies weight loss.  Her normal bowel pattern has changed and alternates between soft to loose mucousy stools with her last BM day before yesterday.  She denies hematochezia or melena. Patient reports occasional EtOH use with her last alcoholic beverage around the Christmas holidays she states. She reports history of smoking but states that she quit approximately 2 months ago. She denies NSAID or anticoagulant use.  Patient reports prior history of EGD and colon[place] in [place] in [place] which she states was unremarkable.  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  She states she has had a total of 4 C-sections but otherwise denies other abdominal surgeries.  She denies recent travel or sick contacts.  She reports a family history of ulcerative colitis her mom and her maternal grandfather with colon cancer. she denies any other complaints at this time.    Hospital Problems  Date Reviewed: 12/17/2010          Codes Class Noted POA    Gastritis ICD-10-CM: K29.70  ICD-9-CM: 535.50  04/04/2018 Unknown            Past Medical History:   Diagnosis Date   ??? Ill-defined condition     bronchitis   ??? Pneumonia      Past Surgical History:   Procedure Laterality Date   ??? HX CESAREAN SECTION  2002, 2004, 2008    x 3   ???  HX CESAREAN SECTION     ??? HX GASTRIC BYPASS  10-2009    [facility]   ??? HX GASTRIC BYPASS     ??? HX GASTRIC BYPASS        Family History   Problem Relation Age of Onset   ??? Diabetes Mother    ??? Hypertension Father    ??? Heart Disease Maternal Grandmother    ??? Heart Attack Maternal Grandmother    ??? High Cholesterol Maternal Grandmother    ??? Arthritis-osteo Maternal Grandmother    ??? Stroke Paternal Aunt    ??? Kidney Disease Maternal Aunt       Social History     Tobacco Use   ??? Smoking status: Former Smoker     Types: Cigarettes     Last attempt to quit: 11/12/2009     Years since quitting: 8.4   ??? Smokeless tobacco: Never Used   Substance Use Topics   ??? Alcohol use: Not Currently     Alcohol/week: 0.0 - 4.0 standard drinks     Comment: occassional      Current Facility-Administered Medications   Medication Dose Route Frequency Provider Last Rate Last Dose   ??? cyanocobalamin (VITAMIN B12) tablet 500 mcg  500 mcg Oral DAILY Irving Burton, MD   500 mcg at 04/05/18 0841   ??? dicyclomine (BENTYL) capsule 10 mg  10 mg Oral Q6H PRN Irving Burton, MD       ??? acetaminophen (TYLENOL) tablet 650 mg  650 mg Oral Q6H PRN Jasarevic, Muhamed, MD       ??? sodium chloride (NS) flush 5-10 mL  5-10 mL IntraVENous Q8H Jasarevic, Muhamed, MD   10 mL at 04/05/18 1420   ??? sodium chloride (NS) flush 5-10 mL  5-10 mL IntraVENous PRN Irving Burton, MD       ??? naloxone (NARCAN) injection 0.1 mg  0.1 mg IntraVENous PRN Irving Burton, MD       ??? ondansetron (ZOFRAN) injection 4 mg  4 mg IntraVENous Q6H PRN Irving Burton, MD   4 mg at 04/05/18 0841   ??? pantoprazole (PROTONIX) 40 mg in 0.9% sodium chloride 10 mL injection  40 mg IntraVENous Q12H Jasarevic, Muhamed, MD   40 mg at 04/05/18 0841   ??? HYDROmorphone (DILAUDID) injection 0.5 mg  0.5 mg IntraVENous Q4H PRN Irving Burton, MD   0.5 mg at 04/05/18 1152      Allergies   Allergen Reactions   ??? Aspirin Other (comments)     Stomach ulcer   ??? Ibuprofen Unknown (comments)     Gastric Bypass--advised to avoid   ??? Nsaids (Non-Steroidal Anti-Inflammatory Drug) Other (comments)     Hx gastric bypass     Review of Systems:  A 12 point review of systems were obtained with pertinent positives noted in above HPI, otherwise negative     Objective:     Patient Vitals for the past 24 hrs:   Temp Pulse Resp BP SpO2   04/05/18 1139 99.2 ??F (37.3 ??C) 91 18 121/67 99 %   04/05/18 0759 99.4 ??F (37.4 ??C) 91 18 116/72 94 %    04/05/18 0659 98.7 ??F (37.1 ??C) 96 18 111/74 97 %   04/05/18 0516 98.8 ??F (37.1 ??C) 93 16 131/75 99 %   04/05/18 0504 ??? ??? ??? 124/76 99 %   04/05/18 0403 ??? ??? ??? (!) 116/98 ???   04/05/18 0303 ??? ??? ??? 121/78 98 %   04/05/18 0103 ??? ??? ??? (!)  118/92 98 %   04/05/18 0018 98.9 ??F (37.2 ??C) 90 16 123/74 100 %   04/05/18 0001 ??? ??? ??? 106/64 96 %   04/04/18 2302 ??? ??? ??? 99/50 99 %   04/04/18 2048 ??? ??? ??? 120/80 99 %   04/04/18 2011 99.6 ??F (37.6 ??C) 91 18 117/68 100 %   04/04/18 1902 ??? ??? ??? 103/63 99 %   04/04/18 1834 99.6 ??F (37.6 ??C) 90 18 128/58 99 %     Physical Exam:  GENERAL: alert, pleasant, cooperative, no distress, appears stated age  EYE:  No scleral icterus noted   LUNG: clear to auscultation bilaterally  HEART: S1, S2   ABDOMEN: soft, gastric and right upper quadrant tenderness appreciated on palpation, no guarding, bowel sounds +, No palpable masses, hernias or organomegaly appreciated   EXTREMITIES: no edema, Pulses 2+   SKIN: no rash or abnormalities  NEUROLOGIC: AOx3    Diagnostic Imaging:  CT of the abdomen and pelvis 04/03/18  ??  INDICATION:  RUQ pain.      COMPARISON: No relevant studies.  ??  TECHNIQUE: CT of the abdomen and pelvis was performed with nonionic intravenous  contrast with coronal and sagittal reformatted images.  ??  DICOM format image data is available to non-affiliated external healthcare  facilities or entities on a secure, media free, reciprocally searchable basis  with patient authorization for 12 months following the date of the study.  ??  FINDINGS:  ??  LOWER THORAX: There are bilateral lower lobe infiltrates, left greater than  right. Additional areas of subsegmental atelectasis.  ??  ABDOMEN: There is no free air or free fluid. There is a slightly ill-defined 2.8  cm hypoenhancing lesion within the left medial hepatic lobe adjacent to the  falciform ligament for which MRI of the abdomen with and without contrast is  suggested. There is fatty infiltration of the liver. There are no radiopaque   gallstones. The adrenal glands are unremarkable. There is no hydronephrosis or  nephrolithiasis.  ??  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 There is thickening of  the horizontal portion of the duodenum with surrounding free fluid and  inflammatory change. This is inferior to the uncinate process of the pancreas  and inflammation likely secondary to duodenal inflammation. Lipase normal on  chart review. No definite free air. No dilated loops of bowel.       ??  PELVIS: There is no focal bladder wall thickening. The uterus is present.  Negative for aortic dissection.  ??  OSSEOUS STRUCTURES: Acutely intact.  ??  IMPRESSION:  1.  Thickening of the horizontal portion of duodenum with surrounding  inflammatory changes. Findings likely reflect duodenitis. Superimposed ulcer not  excluded. While inflammation is near the pancreas, lipase is normal in chart  review.  2.  Fatty infiltration of the liver. 2.8 cm indeterminate lesion within the left  medial hepatic lobe. MRI with and without contrast recommended.  3.  Bibasilar infiltrates/atelectasis.  ??  Laboratory Results:    Labs: Results:   Chemistry Recent Labs     04/05/18  0352 04/03/18  1850   GLU 78 88   NA 136 138   K 3.6 3.6   CL 107 107   CO2 23 26   BUN 3* 4*   CREA 0.5* 0.6   CA 8.4* 8.9   AGAP 6 5   AP 114 129*   TP 6.5 7.4   ALB 2.3* 2.8*  Estimated Creatinine Clearance: 113.5 mL/min (A) (by C-G formula based on SCr of 0.5 mg/dL (L)).   CBC w/Diff Recent Labs     04/05/18  0352 04/03/18  1850   WBC 8.3 10.3   RBC 3.05* 3.41*   HGB 9.5* 10.8*   HCT 29.0* 32.9*   PLT 274 275   GRANS 70.4* 79.3*   LYMPH 16.9* 12.4*   EOS 0.7 0.7      Cardiac Enzymes No results for input(s): CPK, CKND1, MYO in the last 72 hours.    No lab exists for component: CKRMB, TROIP   Coagulation No results for input(s): PTP, INR, APTT, INREXT in the last 72 hours.    Hepatitis Panel Lab Results   Component Value Date/Time     Hep B surface Ag screen Negative 12/20/2010 12:00 AM     Recent Labs     04/05/18  0352 04/03/18  1850   ALT 12 15   SGOT 16 18   TBILI 0.4 0.5   AP 114 129*   ALB 2.3* 2.8*   TP 6.5 7.4         Amylase Lipase    Liver Enzymes Recent Labs     04/05/18  0352   TP 6.5   ALB 2.3*   AP 114   SGOT 16   ALT 12      Thyroid Studies No results for input(s): T4, T3U, TSH, TSHEXT in the last 72 hours.    No lab exists for component: T3RU     Pathology pathology     Signed By: Myrla Halsted MS, APRN, FNP-C    April 05, 2018     Office: [phone]

## 2018-04-05 NOTE — Progress Notes (Signed)
Patient admitted on 04/03/2018 from home with   Chief Complaint   Patient presents with   ??? Abdominal Pain          The patient is being treated for    PMH:   Past Medical History:   Diagnosis Date   ??? Ill-defined condition     bronchitis   ??? Pneumonia         Treatment Team: Treatment Team: Attending Provider: Shanon Rosser, MD; Consulting Provider: Irving Burton, MD; Hospitalist: Shanon Rosser, MD; Primary Nurse: Suszanne Finch, RN      The patient has been admitted to the hospital 0 times in the past 12 months.    Previous 4 Admission Dates Admission and Discharge Diagnosis Interventions Barriers Disposition                                 Patient and Family/Caregivers Goals of Care: home    Caregivers Participating in Plan of Care/Discharge Plan with the patient: no    Tentative dc plan: home    Anticipated DME needs for discharge: none    Does the patient have appropriate clothing available to be worn at discharge?  yes      The patient and care participants are willing to travel n area for discharge facility.  The patient and plan of care participants have been provided with a list of all available Rehab Facilities or Home Health agencies as applicable. CM will follow up with a list of facilities or agencies that are offer acceptance.    CM has disclosed any financial interest that [facility] may have with any facility or agency.    Anticipated Discharge Date: 04/09/18    The plan of care and discharge plan has been discussed with [place], Jeanann Lewandowsky, MD and all other appropriate providers and adjusted per interdisciplinary team recommendations and in discussion with the patient and the patient designated Care Plan Participants.    Barriers to Healthcare Success/ Readmission Risk Factors: n    Consults:  Palliative Care Consult Recommended: n  Transitional Care Clinic Referral: n  Transitional Nurse Navigator Referral: n  Oncology Navigator Referral: n  SW consulted: n   [facility] Consulted: n  Outside Hospital/Community Resources Referrals and Collaboration: n    Food/Nutrition Needs:   n                   Dietician Consulted: n    RRAT Score: Low Risk            4       Total Score        4 Pt. Coverage (Medicare=5 , Medicaid, or Self-Pay=4)        Criteria that do not apply:    Has Seen PCP in Last 6 Months (Yes=3, No=0)    Married. Living with Significant Other. Assisted Living. LTAC. SNF. or   Rehab    Patient Length of Stay (>5 days = 3)    IP Visits Last 12 Months (1-3=4, 4=9, >4=11)    Charlson Comorbidity Score (Age + Comorbid Conditions)           PCP: Chipper Herb, Da, MD . How do you get to your doctor appointments?    Specialists:     Dialysis Unit: n    [facility] Are there any medications that you have trouble paying for? Any difficulty getting your medications?  DME available at Home:none    Home O2 L Flow:  n            Home O2 Provider: n    Home Environment and Prior Level of Function: Lives at [address]  [place] [address] @HOMEPHONE @. Lives with sister.  Multistory or 1 story.  Steps into home.  Responsibilities at home include n    Prior to admission open services: none    Home Health Agency-  Personal Care Agency-    Extended Emergency Contact Information  Primary Emergency Contact: [facility] [place]  Home Phone: [phone]  Mobile Phone: [phone]  Relation: Boyfriend     Transportation: sister will transport home    Therapy Recommendations:    OT = n    PT = n    SLP =  n     RT Home O2 Evaluation =  n    Wound Care =  n    Case Management Assessment    ABUSE/NEGLECT SCREENING   Physical Abuse/Neglect: Denies   Sexual Abuse: Denies   Sexual Abuse: Denies   Other Abuse/Issues: Denies          PRIMARY DECISION MAKER                                   CARE MANAGEMENT INTERVENTIONS   Readmission Interview Completed: Not Applicable   PCP Verified by CM: Yes            Mode of Transport at Discharge: Other (see comment)(sister)                   MyChart Signup: No   Discharge Durable Medical Equipment: No   Physical Therapy Consult: No   Occupational Therapy Consult: No   Speech Therapy Consult: No   Current Support Network: Lives with Caregiver(kids)       History Provided By: Patient   Patient Orientation: Alert and Oriented   Cognition: Alert   Support System Response: Concerned   Previous Living Arrangement: Lives Alone Independent   Home Accessibility: Steps(2)   Prior Functional Level: Independent in ADLs/IADLs   Current Functional Level: Independent in ADLs/IADLs       Can patient return to prior living arrangement: Yes   Ability to make needs known:: Good   Family able to assist with home care needs:: Yes               Types of Needs Identified: Disease Management Education, Treatment Education       Confirm Follow Up Transport: Family(sister)   Conservation officer, nature and Arrange: Yes              DISCHARGE LOCATION   Discharge Placement: Home

## 2018-04-05 NOTE — Progress Notes (Signed)
TRANSFER - IN REPORT:    Verbal report received from Nancy W (RN)(name) on Emiah L Meggison  being received from ED(unit) for routine progression of care      Report consisted of patient???s Situation, Background, Assessment and   Recommendations(SBAR).     Information from the following report(s) SBAR, Kardex, Intake/Output, MAR and Recent Results was reviewed with the receiving nurse.    Opportunity for questions and clarification was provided.      Assessment completed upon patient???s arrival to unit and care assumed.

## 2018-04-05 NOTE — Other (Signed)
Bedside shift change report given to Tim RN (oncoming nurse) by Nancy RN (offgoing nurse). Report included the following information SBAR and Kardex.

## 2018-04-05 NOTE — Progress Notes (Signed)
Bedside and Verbal shift change report given to Lilian K (RN) (oncoming nurse) by Alisha H (RN) (offgoing nurse). Report included the following information SBAR, Kardex, Intake/Output and MAR.

## 2018-04-06 ENCOUNTER — Observation Stay: Admit: 2018-04-07 | Payer: MEDICAID | Primary: Family Medicine

## 2018-04-06 LAB — HEPATIC FUNCTION PANEL
ALT (SGPT): 14 U/L (ref 12–78)
ALT: 14 U/L (ref 12–78)
AST (SGOT): 23 U/L (ref 15–37)
AST: 23 U/L (ref 15–37)
Albumin: 2.2 gm/dl — ABNORMAL LOW (ref 3.4–5.0)
Albumin: 2.2 gm/dl — ABNORMAL LOW (ref 3.4–5.0)
Alk. phosphatase: 129 U/L — ABNORMAL HIGH (ref 45–117)
Alkaline Phosphatase: 129 U/L — ABNORMAL HIGH (ref 45–117)
Bilirubin, Direct: 0.2 mg/dl (ref 0.0–0.2)
Bilirubin, direct: 0.2 mg/dl (ref 0.0–0.2)
Bilirubin, total: 0.4 mg/dl (ref 0.2–1.0)
Protein, total: 6.5 gm/dl (ref 6.4–8.2)
Total Bilirubin: 0.4 mg/dl (ref 0.2–1.0)
Total Protein: 6.5 gm/dl (ref 6.4–8.2)

## 2018-04-06 LAB — CBC WITH AUTO DIFFERENTIAL
Basophils %: 0.3 % (ref 0–3)
Eosinophils %: 1 % (ref 0–5)
Hematocrit: 28.8 % — ABNORMAL LOW (ref 37.0–50.0)
Hemoglobin: 9.3 gm/dl — ABNORMAL LOW (ref 13.0–17.2)
Immature Granulocytes: 0.5 % (ref 0.0–3.0)
Lymphocytes %: 24.8 % — ABNORMAL LOW (ref 28–48)
MCH: 31 pg (ref 25.4–34.6)
MCHC: 32.3 gm/dl (ref 30.0–36.0)
MCV: 96 fL (ref 80.0–98.0)
MPV: 11.2 fL — ABNORMAL HIGH (ref 6.0–10.0)
Monocytes %: 13.3 % — ABNORMAL HIGH (ref 1–13)
Neutrophils %: 60.1 % (ref 34–64)
Nucleated RBCs: 0 (ref 0–0)
Platelets: 284 10*3/uL (ref 140–450)
RBC: 3 M/uL — ABNORMAL LOW (ref 3.60–5.20)
RDW-SD: 57.1 — ABNORMAL HIGH (ref 36.4–46.3)
WBC: 6.2 10*3/uL (ref 4.0–11.0)

## 2018-04-06 LAB — BASIC METABOLIC PANEL
Anion Gap: 7 mmol/L (ref 5–15)
BUN: 3 mg/dl — ABNORMAL LOW (ref 7–25)
CO2: 26 mEq/L (ref 21–32)
Calcium: 8.7 mg/dl (ref 8.5–10.1)
Chloride: 105 mEq/L (ref 98–107)
Creatinine: 0.6 mg/dl (ref 0.6–1.3)
EGFR IF NonAfrican American: >60
GFR African American: >60
Glucose: 78 mg/dl (ref 74–106)
Potassium: 3.5 mEq/L (ref 3.5–5.1)
Sodium: 138 mEq/L (ref 136–145)

## 2018-04-06 LAB — LIPASE
Lipase: 67 U/L — ABNORMAL LOW (ref 73–393)
Lipase: 67 U/L — ABNORMAL LOW (ref 73–393)

## 2018-04-06 LAB — METABOLIC PANEL, BASIC
Anion gap: 7 mmol/L (ref 5–15)
BUN: 3 mg/dl — ABNORMAL LOW (ref 7–25)
CO2: 26 mEq/L (ref 21–32)
Calcium: 8.7 mg/dl (ref 8.5–10.1)
Chloride: 105 mEq/L (ref 98–107)
Creatinine: 0.6 mg/dl (ref 0.6–1.3)
GFR est AA: >60
GFR est non-AA: >60
Glucose: 78 mg/dl (ref 74–106)
Potassium: 3.5 mEq/L (ref 3.5–5.1)
Sodium: 138 mEq/L (ref 136–145)

## 2018-04-06 LAB — CBC WITH AUTOMATED DIFF
BASOPHILS: 0.3 % (ref 0–3)
EOSINOPHILS: 1 % (ref 0–5)
HCT: 28.8 % — ABNORMAL LOW (ref 37.0–50.0)
HGB: 9.3 gm/dl — ABNORMAL LOW (ref 13.0–17.2)
IMMATURE GRANULOCYTES: 0.5 % (ref 0.0–3.0)
LYMPHOCYTES: 24.8 % — ABNORMAL LOW (ref 28–48)
MCH: 31 pg (ref 25.4–34.6)
MCHC: 32.3 gm/dl (ref 30.0–36.0)
MCV: 96 fL (ref 80.0–98.0)
MONOCYTES: 13.3 % — ABNORMAL HIGH (ref 1–13)
MPV: 11.2 fL — ABNORMAL HIGH (ref 6.0–10.0)
NEUTROPHILS: 60.1 % (ref 34–64)
NRBC: 0 (ref 0–0)
PLATELET: 284 10*3/uL (ref 140–450)
RBC: 3 M/uL — ABNORMAL LOW (ref 3.60–5.20)
RDW-SD: 57.1 — ABNORMAL HIGH (ref 36.4–46.3)
WBC: 6.2 10*3/uL (ref 4.0–11.0)

## 2018-04-06 MED ORDER — FERROUS SULFATE 325 MG (65 MG ELEMENTAL IRON) TAB
325 mg (65 mg iron) | Freq: Every day | ORAL | Status: DC
Start: 2018-04-06 — End: 2018-04-12
  Administered 2018-04-07 – 2018-04-12 (×6): via ORAL

## 2018-04-06 MED ORDER — PROPOFOL 10 MG/ML IV EMUL
10 mg/mL | INTRAVENOUS | Status: DC | PRN
Start: 2018-04-06 — End: 2018-04-06
  Administered 2018-04-06 (×3): via INTRAVENOUS

## 2018-04-06 MED ORDER — PROPOFOL 10 MG/ML IV EMUL
10 mg/mL | INTRAVENOUS | Status: DC | PRN
Start: 2018-04-06 — End: 2018-04-06
  Administered 2018-04-06: 21:00:00 via INTRAVENOUS

## 2018-04-06 MED ORDER — LIDOCAINE 4 % TOPICAL SOLN
4 % (0 mg/mL) | Status: DC | PRN
Start: 2018-04-06 — End: 2018-04-06
  Administered 2018-04-06: 21:00:00 via NASAL

## 2018-04-06 MED ORDER — SODIUM CHLORIDE 0.9 % IV
INTRAVENOUS | Status: DC | PRN
Start: 2018-04-06 — End: 2018-04-06
  Administered 2018-04-06: 21:00:00 via INTRAVENOUS

## 2018-04-06 MED ORDER — LIDOCAINE (PF) 10 MG/ML (1 %) IJ SOLN
10 mg/mL (1 %) | INTRAMUSCULAR | Status: DC | PRN
Start: 2018-04-06 — End: 2018-04-06
  Administered 2018-04-06: 21:00:00 via INTRAVENOUS

## 2018-04-06 MED FILL — HYDROMORPHONE 1 MG/ML INJECTION SOLUTION: 1 mg/mL | INTRAMUSCULAR | Qty: 1

## 2018-04-06 MED FILL — BD POSIFLUSH NORMAL SALINE 0.9 % INJECTION SYRINGE: INTRAMUSCULAR | Qty: 10

## 2018-04-06 MED FILL — ONDANSETRON (PF) 4 MG/2 ML INJECTION: 4 mg/2 mL | INTRAMUSCULAR | Qty: 2

## 2018-04-06 MED FILL — PANTOPRAZOLE 40 MG IV SOLR: 40 mg | INTRAVENOUS | Qty: 40

## 2018-04-06 NOTE — Progress Notes (Signed)
INTERNAL MEDICINE PROGRESS NOTE  Patient: Ashley [name]   Date of Birth: 11-19-1985   MRN: [id number]        Assessment / The Hospital course:     Principle Problems:  RUQ , Epigastric pain radiating to the back, intermittent??  Gastritis and duodenitis, Possible anastomotic ulcer or penetrating PUD    Indeterminate lesion within the left medial hepatic lobe - 2.8 cm   - presented with RUQ , Epigastric pain radiating to the back, intermittent , associated with N/V , lipase normal  - h/o Roux-en-Y gastric bypass  - she diagnosed with pancreatitis few days ago, Lipase normal here was 446 on 1/19, pancrease normal on CT   - on admission afebrile, vitals stable, blood workup is unremarkable, CT abd showed Duodenitis   - started on PPI , GI consult, Plan for EGD and MRCP   Chronic NCNC anemia - IDA,  iron panel showed iron deficiency, Stable, Po iron added   ??  Code Status: Full code   DVT prophylaxis: pharmacologic and mechanical  Disposition :??home    Today Plan:   Continue with PPI   GI plan for EGD, enteroscopy and MRCP   Add PO iron   Continue with PT/ambulation       Subjective:   Patient currently denies new complain     Medical Decision Making   Chart, Images and Lab data reviewed, necessary medical Orders placed   Discussed with nursing staff and Case Manager. .     Vitals:    04/05/18 1656 04/05/18 2209 04/06/18 0144 04/06/18 0524   BP: 111/67 117/69 119/77 118/72   Pulse: 82 86 96 97   Resp: _0 Temp: 99.3 ??F (37.4 ??C) 98.2 ??F (36.8 ??C) 98.4 ??F (36.9 ??C) 98.4 ??F (36.9 ??C)   SpO2: 97% 96% 92% 90%   Weight:       Height:         Temp (24hrs), Avg:98.7 ??F (37.1 ??C), Min:98.2 ??F (36.8 ??C), Max:99.3 ??F (37.4 ??C)      Intake/Output Summary (Last 24 hours) at 04/06/2018 0807  Last data filed at 04/06/2018 0600  Gross per 24 hour   Intake 825 ml   Output ???   Net 825 ml       Physical Exam:   General Appearance:   Appears in no acute distress.,   Skin:   Skin warm & dry, No rash, No jaundice,   Lymph:  There  is no lymphadenopathy,   HEENT:   PERRLA, EOMI, Moist oral mucous membranes, conjunctiva clear,   Neck:   Supple, Without masses,   Lungs:   Clear, No wheezes., No rales., Normal respiratory effort,   Heart:  Regular rate and rhythm, No gallop,   Abdomen:   Soft , Non-distended, Normal bowel sounds and Non-tender,   Extremities:   No edema of legs, Normal pedal and radial pulses,   Neuro:   alert, oriented, affect appropriate, speech fluent, cranial nerves intact, no focal neurological deficits and moves all extremities well    Current medications:     Current Facility-Administered Medications   Medication Dose Route Frequency   ??? lactated Ringers infusion  75 mL/hr IntraVENous CONTINUOUS   ??? cyanocobalamin (VITAMIN B12) tablet 500 mcg  500 mcg Oral DAILY   ??? dicyclomine (BENTYL) capsule 10 mg  10 mg Oral Q6H PRN   ??? acetaminophen (TYLENOL) tablet 650 mg  650 mg Oral Q6H PRN   ??? sodium chloride (  NS) flush 5-10 mL  5-10 mL IntraVENous Q8H   ??? sodium chloride (NS) flush 5-10 mL  5-10 mL IntraVENous PRN   ??? naloxone (NARCAN) injection 0.1 mg  0.1 mg IntraVENous PRN   ??? ondansetron (ZOFRAN) injection 4 mg  4 mg IntraVENous Q6H PRN   ??? pantoprazole (PROTONIX) 40 mg in 0.9% sodium chloride 10 mL injection  40 mg IntraVENous Q12H   ??? HYDROmorphone (DILAUDID) injection 0.5 mg  0.5 mg IntraVENous Q4H PRN          Laboratory and Radiology Data :     Recent Results (from the past 24 hour(s))   CBC WITH AUTOMATED DIFF    Collection Time: 04/06/18  4:47 AM   Result Value Ref Range    WBC 6.2 4.0 - 11.0 1000/mm3    RBC 3.00 (L) 3.60 - 5.20 M/uL    HGB 9.3 (L) 13.0 - 17.2 gm/dl    HCT 28.8 (L) 37.0 - 50.0 %    MCV 96.0 80.0 - 98.0 fL    MCH 31.0 25.4 - 34.6 pg    MCHC 32.3 30.0 - 36.0 gm/dl    PLATELET 284 140 - 450 1000/mm3    MPV 11.2 (H) 6.0 - 10.0 fL    RDW-SD 57.1 (H) 36.4 - 46.3      NRBC 0 0 - 0      IMMATURE GRANULOCYTES 0.5 0.0 - 3.0 %    NEUTROPHILS 60.1 34 - 64 %    LYMPHOCYTES 24.8 (L) 28 - 48 %    MONOCYTES 13.3 (H) 1  - 13 %    EOSINOPHILS 1.0 0 - 5 %    BASOPHILS 0.3 0 - 3 %   METABOLIC PANEL, BASIC    Collection Time: 04/06/18  4:47 AM   Result Value Ref Range    Sodium 138 136 - 145 mEq/L    Potassium 3.5 3.5 - 5.1 mEq/L    Chloride 105 98 - 107 mEq/L    CO2 26 21 - 32 mEq/L    Glucose 78 74 - 106 mg/dl    BUN 3 (L) 7 - 25 mg/dl    Creatinine 0.6 0.6 - 1.3 mg/dl    GFR est AA >60.0      GFR est non-AA >60      Calcium 8.7 8.5 - 10.1 mg/dl    Anion gap 7 5 - 15 mmol/L   HEPATIC FUNCTION PANEL    Collection Time: 04/06/18  4:47 AM   Result Value Ref Range    AST (SGOT) 23 15 - 37 U/L    ALT (SGPT) 14 12 - 78 U/L    Alk. phosphatase 129 (H) 45 - 117 U/L    Bilirubin, total 0.4 0.2 - 1.0 mg/dl    Protein, total 6.5 6.4 - 8.2 gm/dl    Albumin 2.2 (L) 3.4 - 5.0 gm/dl    Bilirubin, direct 0.2 0.0 - 0.2 mg/dl   LIPASE    Collection Time: 04/06/18  4:47 AM   Result Value Ref Range    Lipase 67 (L) 73 - 393 U/L       XR Results:  Results from Hospital Encounter encounter on 01/20/18   XR KNEE RT MIN 4 V    Narrative Indication: 79 without injury    4 views right knee show no bony or soft tissue abnormality.      Impression IMPRESSION: Normal study.         CT Results:  Results  from Hospital Encounter encounter on 04/03/18   CT ABD PELV W CONT    Narrative CT of the abdomen and pelvis     INDICATION:  RUQ pain.      COMPARISON: No relevant studies.    TECHNIQUE: CT of the abdomen and pelvis was performed with nonionic intravenous  contrast with coronal and sagittal reformatted images.    DICOM format image data is available to non-affiliated external healthcare  facilities or entities on a secure, media free, reciprocally searchable basis  with patient authorization for 12 months following the date of the study.    FINDINGS:    LOWER THORAX: There are bilateral lower lobe infiltrates, left greater than  right. Additional areas of subsegmental atelectasis.    ABDOMEN: There is no free air or free fluid. There is a slightly ill-defined  2.8  cm hypoenhancing lesion within the left medial hepatic lobe adjacent to the  falciform ligament for which MRI of the abdomen with and without contrast is  suggested. There is fatty infiltration of the liver. There are no radiopaque  gallstones. The adrenal glands are unremarkable. There is no hydronephrosis or  nephrolithiasis.    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 There is thickening of  the horizontal portion of the duodenum with surrounding free fluid and  inflammatory change. This is inferior to the uncinate process of the pancreas  and inflammation likely secondary to duodenal inflammation. Lipase normal on  chart review. No definite free air. No dilated loops of bowel.         PELVIS: There is no focal bladder wall thickening. The uterus is present.  Negative for aortic dissection.    OSSEOUS STRUCTURES: Acutely intact.      Impression IMPRESSION:    1.  Thickening of the horizontal portion of duodenum with surrounding  inflammatory changes. Findings likely reflect duodenitis. Superimposed ulcer not  excluded. While inflammation is near the pancreas, lipase is normal in chart  review.  2.  Fatty infiltration of the liver. 2.8 cm indeterminate lesion within the left  medial hepatic lobe. MRI with and without contrast recommended.  3.  Bibasilar infiltrates/atelectasis.         MRI Results:  No results found for this or any previous visit.    Nuclear Medicine Results:  No results found for this or any previous visit.    [place] Results:  No results found for this or any previous visit.    IR Results:  No results found for this or any previous visit.    VAS/US Results:  Results from Hospital Encounter encounter on 01/20/18   DUPLEX LOWER EXT VENOUS RIGHT    Narrative                                                               Study ID:   [id number]                                                 [facility]                                            [address] [place],                                          [place]                                   Lower Extremity Venous Report    Name: KASHLYNN, KUNDERT Date: 01/20/2018 08:00 PM  MRN: [id number]                      Patient Location: [id number]  DOB: August 22, 1985                  Age: 33 yrs  Gender: Female                   Account #: [id number]  Reason For Study: Right leg pain  Ordering Physician: Johnella Moloney    Performed By: Lowella Dell, RVS    Interpretation Summary  No evidence of superficial or deep vein thrombosis noted in the right lower  extremity.  No evidence of deep venous thrombosis in the contralateral/left common   femoral  vein.  _____________________________________________________________________________  __      QUALITY/PROCEDURE  Limited unilateral venous duplex performed of the right leg. Quality of the  study is good. M79.604.    HISTORY/SYMPTOMS  Right leg pain.    RIGHT LEG  The common femoral, femoral, popliteal, posterior tibial and peroneal veins  were examined with duplex ultrasound. The deep veins were patent and  compressible with no evidence of intraluminal thrombus. Spontaneous, phasic  venous flow with normal augmentation noted throughout the right leg.    RIGHT SAPHENOUS VEINS  Spectral Doppler exam demonstrates normal venous flow in the right great  saphenous vein. Compression of the right great saphenous vein is complete.  Filling defects in the right great saphenous vein are absent.      LEFT LEG  Spectral Doppler exam demonstrates normal venous flow in the left common  femoral vein. Compression of the left common femoral vein is complete.   Filling  defects in the left common femoral vein are  absent.      Electronically signed byDR Wenda Low, MD   01/21/2018 01:10 AM       Admission HPI:           Christy Sartorius M.D.  [facility]  Page [phone]

## 2018-04-06 NOTE — Progress Notes (Signed)
Problem: Falls - Risk of  Goal: *Absence of Falls  Description  Document Schmid Fall Risk and appropriate interventions in the flowsheet.  Outcome: Progressing Towards Goal  Note: Fall Risk Interventions:  Mobility Interventions: Assess mobility with egress test         Medication Interventions: Bed/chair exit alarm

## 2018-04-06 NOTE — Anesthesia Pre-Procedure Evaluation (Signed)
Relevant Problems   No relevant active problems       Anesthetic History   No history of anesthetic complications            Review of Systems / Medical History  Patient summary reviewed, nursing notes reviewed and pertinent labs reviewed    Pulmonary  Within defined limits                 Neuro/Psych   Within defined limits           Cardiovascular  Within defined limits                Exercise tolerance: >4 METS     GI/Hepatic/Renal               Comments: Abdominal pain Endo/Other        Anemia     Other Findings   Comments: S/p gastric bypass  Chronic anemia         Physical Exam    Airway  Mallampati: II  TM Distance: 4 - 6 cm  Neck ROM: normal range of motion   Mouth opening: Normal     Cardiovascular  Regular rate and rhythm,  S1 and S2 normal,  no murmur, click, rub, or gallop             Dental    Dentition: Poor dentition     Pulmonary  Breath sounds clear to auscultation               Abdominal  GI exam deferred       Other Findings            Anesthetic Plan    ASA: 2  Anesthesia type: general and total IV anesthesia          Induction: Intravenous  Anesthetic plan and risks discussed with: Patient      I have informed the patient or their guardian of the nature and purpose of the type of anesthesia, the reasonable alternative anesthetic methods, pertinent foreseeable risks involved and the possibility of complications. I have explained that an alternative form of anesthesia may be required by unexpected conditions arising before or during the procedure. It is understood that general anesthesia may be required for safety or comfort. Questions have been answered to the satisfaction of the patient who accepts the risks and agrees to proceed as planned. The above anesthetic review of medical history, physical exam, tests, assessment, subsequent anesthetic plan and consent have been accomplished pre-procedure.

## 2018-04-06 NOTE — Anesthesia Post-Procedure Evaluation (Signed)
Procedure(s):  ESOPHAGOGASTRODUODENOSCOPY (EGD) with Push Enteroscopy.    general, total IV anesthesia    Anesthesia Post Evaluation      Multimodal analgesia: multimodal analgesia not used between 6 hours prior to anesthesia start to PACU discharge  Patient location during evaluation: PACU  Patient participation: complete - patient participated  Level of consciousness: awake  Pain management: satisfactory to patient  Airway patency: patent  Anesthetic complications: no  Cardiovascular status: stable  Respiratory status: acceptable  Hydration status: balanced  Post anesthesia nausea and vomiting:  none      Vitals Value Taken Time   BP 119/78 04/06/2018  4:02 PM   Temp     Pulse 97 04/06/2018  3:57 PM   Resp 16 04/06/2018  4:02 PM   SpO2 95 % 04/06/2018  4:05 PM   Vitals shown include unvalidated device data.

## 2018-04-06 NOTE — Progress Notes (Signed)
Bedside and Verbal shift change report given to Patrice [place] (RN) (oncoming nurse) by [place] (RN) (offgoing nurse). Report included the following information SBAR, Kardex, MAR and Recent Results.

## 2018-04-06 NOTE — Progress Notes (Signed)
Bedside and Verbal shift change report given to Lilian K (RN) (oncoming nurse) by Alisha H (RN) (offgoing nurse). Report included the following information SBAR, Kardex, MAR and Recent Results.

## 2018-04-06 NOTE — Other (Signed)
TRANSFER - OUT REPORT:    Verbal report given to Alicia RN(name) on Ashley [name]  being transferred to [facility](unit) for ordered procedure       Report consisted of patient???s Situation, Background, Assessment and   Recommendations(SBAR).     Information from the following report(s) Procedure Summary, Intake/Output, MAR and Recent Results was reviewed with the receiving nurse.    Lines:   Peripheral IV 04/05/18 Left Antecubital (Active)   Site Assessment Clean, dry, & intact 04/06/2018  3:56 PM   Phlebitis Assessment 0 04/06/2018  3:56 PM   Infiltration Assessment 0 04/06/2018  3:56 PM   Dressing Status Clean, dry, & intact 04/06/2018  3:56 PM   Dressing Type Transparent 04/06/2018  3:56 PM   Hub Color/Line Status Pink 04/06/2018  3:56 PM   Action Taken Open ports on tubing capped 04/05/2018  8:30 PM   Alcohol Cap Used Yes 04/05/2018  8:30 PM        Opportunity for questions and clarification was provided.      Patient transported with:   [employer]

## 2018-04-06 NOTE — Anesthesia Pre-Procedure Evaluation (Addendum)
Relevant Problems   No relevant active problems       Anesthetic History   No history of anesthetic complications            Review of Systems / Medical History  Patient summary reviewed, nursing notes reviewed and pertinent labs reviewed    Pulmonary  Within defined limits                 Neuro/Psych   Within defined limits           Cardiovascular  Within defined limits                Exercise tolerance: >4 METS     GI/Hepatic/Renal               Comments: Abdominal pain Endo/Other        Anemia     Other Findings   Comments: S/p gastric bypass  Chronic anemia         Physical Exam    Airway  Mallampati: II  TM Distance: 4 - 6 cm  Neck ROM: normal range of motion   Mouth opening: Normal     Cardiovascular  Regular rate and rhythm,  S1 and S2 normal,  no murmur, click, rub, or gallop             Dental    Dentition: Poor dentition     Pulmonary  Breath sounds clear to auscultation               Abdominal  GI exam deferred       Other Findings            Anesthetic Plan    ASA: 2  Anesthesia type: general and total IV anesthesia          Induction: Intravenous  Anesthetic plan and risks discussed with: Patient      I have informed the patient or their guardian of the nature and purpose of the type of anesthesia, the reasonable alternative anesthetic methods, pertinent foreseeable risks involved and the possibility of complications. I have explained that an alternative form of anesthesia may be required by unexpected conditions arising before or during the procedure. It is understood that general anesthesia may be required for safety or comfort. Questions have been answered to the satisfaction of the patient who accepts the risks and agrees to proceed as planned. The above anesthetic review of medical history, physical exam, tests, assessment, subsequent anesthetic plan and consent have been accomplished pre-procedure.

## 2018-04-06 NOTE — Other (Signed)
TRANSFER - IN REPORT:    Ashley [name]  being received from [facility] for ordered procedure      Report consisted of patient???s Situation, Background, Assessment and   Recommendations(SBAR).     Information from the following report(s) MAR was reviewed with the receiving nurse.    Assessment completed upon patient???s arrival to unit and care assumed.

## 2018-04-06 NOTE — Anesthesia Post-Procedure Evaluation (Signed)
Procedure(s):  ESOPHAGOGASTRODUODENOSCOPY (EGD) with Push Enteroscopy.    general, total IV anesthesia    Anesthesia Post Evaluation      Multimodal analgesia: multimodal analgesia not used between 6 hours prior to anesthesia start to PACU discharge  Patient location during evaluation: PACU  Patient participation: complete - patient participated  Level of consciousness: awake  Pain management: satisfactory to patient  Airway patency: patent  Anesthetic complications: no  Cardiovascular status: stable  Respiratory status: acceptable  Hydration status: balanced  Post anesthesia nausea and vomiting:  none      Vitals Value Taken Time   BP 119/78 04/06/2018  4:02 PM   Temp     Pulse 97 04/06/2018  3:57 PM   Resp 16 04/06/2018  4:02 PM   SpO2 95 % 04/06/2018  4:05 PM   Vitals shown include unvalidated device data.

## 2018-04-06 NOTE — Progress Notes (Signed)
INTERNAL MEDICINE PROGRESS NOTE  Patient: Ashley [name]   Date of Birth: Nov 25, 1985   MRN: [id number]        Assessment / The Hospital course:     Principle Problems:  RUQ , Epigastric pain radiating to the back, intermittent??  Gastritis and duodenitis, Possible anastomotic ulcer or penetrating PUD    Indeterminate lesion within the left medial hepatic lobe - 2.8 cm   - presented with RUQ , Epigastric pain radiating to the back, intermittent , associated with N/V , lipase normal  - h/o Roux-en-Y gastric bypass  - she diagnosed with pancreatitis few days ago, Lipase normal here was 446 on 1/19, pancrease normal on CT   - on admission afebrile, vitals stable, blood workup is unremarkable, CT abd showed Duodenitis   - started on PPI , GI consult, Plan for EGD and MRCP   Chronic NCNC anemia - IDA,  iron panel showed iron deficiency, Stable, Po iron added   ??  Code Status: Full code   DVT prophylaxis: pharmacologic and mechanical  Disposition :??home    Today Plan:   Continue with PPI   GI plan for EGD, enteroscopy and MRCP   Add PO iron   Continue with PT/ambulation       Subjective:   Patient currently denies new complain     Medical Decision Making   Chart, Images and Lab data reviewed, necessary medical Orders placed   Discussed with nursing staff and Case Manager. .     Vitals:    04/05/18 1656 04/05/18 2209 04/06/18 0144 04/06/18 0524   BP: 111/67 117/69 119/77 118/72   Pulse: 82 86 96 97   Resp: '18 18 18 18   ' Temp: 99.3 ??F (37.4 ??C) 98.2 ??F (36.8 ??C) 98.4 ??F (36.9 ??C) 98.4 ??F (36.9 ??C)   SpO2: 97% 96% 92% 90%   Weight:       Height:         Temp (24hrs), Avg:98.7 ??F (37.1 ??C), Min:98.2 ??F (36.8 ??C), Max:99.3 ??F (37.4 ??C)      Intake/Output Summary (Last 24 hours) at 04/06/2018 0807  Last data filed at 04/06/2018 0600  Gross per 24 hour   Intake 825 ml   Output ???   Net 825 ml       Physical Exam:   General Appearance:   Appears in no acute distress.,   Skin:   Skin warm & dry, No rash, No jaundice,    Lymph:  There is no lymphadenopathy,   HEENT:   PERRLA, EOMI, Moist oral mucous membranes, conjunctiva clear,   Neck:   Supple, Without masses,   Lungs:   Clear, No wheezes., No rales., Normal respiratory effort,   Heart:  Regular rate and rhythm, No gallop,   Abdomen:   Soft , Non-distended, Normal bowel sounds and Non-tender,   Extremities:   No edema of legs, Normal pedal and radial pulses,   Neuro:   alert, oriented, affect appropriate, speech fluent, cranial nerves intact, no focal neurological deficits and moves all extremities well    Current medications:     Current Facility-Administered Medications   Medication Dose Route Frequency   ??? lactated Ringers infusion  75 mL/hr IntraVENous CONTINUOUS   ??? cyanocobalamin (VITAMIN B12) tablet 500 mcg  500 mcg Oral DAILY   ??? dicyclomine (BENTYL) capsule 10 mg  10 mg Oral Q6H PRN   ??? acetaminophen (TYLENOL) tablet 650 mg  650 mg Oral Q6H PRN   ??? sodium chloride (  NS) flush 5-10 mL  5-10 mL IntraVENous Q8H   ??? sodium chloride (NS) flush 5-10 mL  5-10 mL IntraVENous PRN   ??? naloxone (NARCAN) injection 0.1 mg  0.1 mg IntraVENous PRN   ??? ondansetron (ZOFRAN) injection 4 mg  4 mg IntraVENous Q6H PRN   ??? pantoprazole (PROTONIX) 40 mg in 0.9% sodium chloride 10 mL injection  40 mg IntraVENous Q12H   ??? HYDROmorphone (DILAUDID) injection 0.5 mg  0.5 mg IntraVENous Q4H PRN          Laboratory and Radiology Data :     Recent Results (from the past 24 hour(s))   CBC WITH AUTOMATED DIFF    Collection Time: 04/06/18  4:47 AM   Result Value Ref Range    WBC 6.2 4.0 - 11.0 1000/mm3    RBC 3.00 (L) 3.60 - 5.20 M/uL    HGB 9.3 (L) 13.0 - 17.2 gm/dl    HCT 28.8 (L) 37.0 - 50.0 %    MCV 96.0 80.0 - 98.0 fL    MCH 31.0 25.4 - 34.6 pg    MCHC 32.3 30.0 - 36.0 gm/dl    PLATELET 284 140 - 450 1000/mm3    MPV 11.2 (H) 6.0 - 10.0 fL    RDW-SD 57.1 (H) 36.4 - 46.3      NRBC 0 0 - 0      IMMATURE GRANULOCYTES 0.5 0.0 - 3.0 %    NEUTROPHILS 60.1 34 - 64 %    LYMPHOCYTES 24.8 (L) 28 - 48 %     MONOCYTES 13.3 (H) 1 - 13 %    EOSINOPHILS 1.0 0 - 5 %    BASOPHILS 0.3 0 - 3 %   METABOLIC PANEL, BASIC    Collection Time: 04/06/18  4:47 AM   Result Value Ref Range    Sodium 138 136 - 145 mEq/L    Potassium 3.5 3.5 - 5.1 mEq/L    Chloride 105 98 - 107 mEq/L    CO2 26 21 - 32 mEq/L    Glucose 78 74 - 106 mg/dl    BUN 3 (L) 7 - 25 mg/dl    Creatinine 0.6 0.6 - 1.3 mg/dl    GFR est AA >60.0      GFR est non-AA >60      Calcium 8.7 8.5 - 10.1 mg/dl    Anion gap 7 5 - 15 mmol/L   HEPATIC FUNCTION PANEL    Collection Time: 04/06/18  4:47 AM   Result Value Ref Range    AST (SGOT) 23 15 - 37 U/L    ALT (SGPT) 14 12 - 78 U/L    Alk. phosphatase 129 (H) 45 - 117 U/L    Bilirubin, total 0.4 0.2 - 1.0 mg/dl    Protein, total 6.5 6.4 - 8.2 gm/dl    Albumin 2.2 (L) 3.4 - 5.0 gm/dl    Bilirubin, direct 0.2 0.0 - 0.2 mg/dl   LIPASE    Collection Time: 04/06/18  4:47 AM   Result Value Ref Range    Lipase 67 (L) 73 - 393 U/L       XR Results:  Results from Hospital Encounter encounter on 01/20/18   XR KNEE RT MIN 4 V    Narrative Indication: 79 without injury    4 views right knee show no bony or soft tissue abnormality.      Impression IMPRESSION: Normal study.         CT Results:  Results  from Hospital Encounter encounter on 04/03/18   CT ABD PELV W CONT    Narrative CT of the abdomen and pelvis     INDICATION:  RUQ pain.      COMPARISON: No relevant studies.    TECHNIQUE: CT of the abdomen and pelvis was performed with nonionic intravenous  contrast with coronal and sagittal reformatted images.    DICOM format image data is available to non-affiliated external healthcare  facilities or entities on a secure, media free, reciprocally searchable basis  with patient authorization for 12 months following the date of the study.    FINDINGS:    LOWER THORAX: There are bilateral lower lobe infiltrates, left greater than  right. Additional areas of subsegmental atelectasis.     ABDOMEN: There is no free air or free fluid. There is a slightly ill-defined 2.8  cm hypoenhancing lesion within the left medial hepatic lobe adjacent to the  falciform ligament for which MRI of the abdomen with and without contrast is  suggested. There is fatty infiltration of the liver. There are no radiopaque  gallstones. The adrenal glands are unremarkable. There is no hydronephrosis or  nephrolithiasis.    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 There is thickening of  the horizontal portion of the duodenum with surrounding free fluid and  inflammatory change. This is inferior to the uncinate process of the pancreas  and inflammation likely secondary to duodenal inflammation. Lipase normal on  chart review. No definite free air. No dilated loops of bowel.         PELVIS: There is no focal bladder wall thickening. The uterus is present.  Negative for aortic dissection.    OSSEOUS STRUCTURES: Acutely intact.      Impression IMPRESSION:    1.  Thickening of the horizontal portion of duodenum with surrounding  inflammatory changes. Findings likely reflect duodenitis. Superimposed ulcer not  excluded. While inflammation is near the pancreas, lipase is normal in chart  review.  2.  Fatty infiltration of the liver. 2.8 cm indeterminate lesion within the left  medial hepatic lobe. MRI with and without contrast recommended.  3.  Bibasilar infiltrates/atelectasis.         MRI Results:  No results found for this or any previous visit.    Nuclear Medicine Results:  No results found for this or any previous visit.    [place] Results:  No results found for this or any previous visit.    IR Results:  No results found for this or any previous visit.    VAS/US Results:  Results from Hospital Encounter encounter on 01/20/18   DUPLEX LOWER EXT VENOUS RIGHT    Narrative                                                               Study ID:   [id number]                                                 [facility]  [facility]                                            [address] [place],                                          [place]                                   Lower Extremity Venous Report    Name: VENUS, GILLES Date: 01/20/2018 08:00 PM  MRN: [id number]                      Patient Location: YWV^[id number]  DOB: 04/09/1985                  Age: 38 yrs  Gender: Female                   Account #: [id number]  Reason For Study: Right leg pain  Ordering Physician: Johnella Moloney    Performed By: Lowella Dell, RVS    Interpretation Summary  No evidence of superficial or deep vein thrombosis noted in the right lower  extremity.  No evidence of deep venous thrombosis in the contralateral/left common   femoral  vein.  _____________________________________________________________________________  __      QUALITY/PROCEDURE  Limited unilateral venous duplex performed of the right leg. Quality of the  study is good. M79.604.    HISTORY/SYMPTOMS  Right leg pain.    RIGHT LEG  The common femoral, femoral, popliteal, posterior tibial and peroneal veins  were examined with duplex ultrasound. The deep veins were patent and  compressible with no evidence of intraluminal thrombus. Spontaneous, phasic  venous flow with normal augmentation noted throughout the right leg.    RIGHT SAPHENOUS VEINS  Spectral Doppler exam demonstrates normal venous flow in the right great  saphenous vein. Compression of the right great saphenous vein is complete.  Filling defects in the right great saphenous vein are absent.      LEFT LEG  Spectral Doppler exam demonstrates normal venous flow in the left common   femoral vein. Compression of the left common femoral vein is complete.   Filling  defects in the left common femoral vein are absent.      Electronically signed byDR Wenda Low, MD   01/21/2018 01:10 AM       Admission HPI:           Christy Sartorius M.D.  [facility]  Page [phone]

## 2018-04-07 LAB — COMPREHENSIVE METABOLIC PANEL
ALT: 15 U/L (ref 12–78)
AST: 23 U/L (ref 15–37)
Albumin: 2.4 gm/dl — ABNORMAL LOW (ref 3.4–5.0)
Alkaline Phosphatase: 135 U/L — ABNORMAL HIGH (ref 45–117)
Anion Gap: 6 mmol/L (ref 5–15)
BUN: 2 mg/dl — ABNORMAL LOW (ref 7–25)
CO2: 26 mEq/L (ref 21–32)
Calcium: 8.4 mg/dl — ABNORMAL LOW (ref 8.5–10.1)
Chloride: 105 mEq/L (ref 98–107)
Creatinine: 0.5 mg/dl — ABNORMAL LOW (ref 0.6–1.3)
EGFR IF NonAfrican American: >60
GFR African American: >60
Glucose: 95 mg/dl (ref 74–106)
Potassium: 4 mEq/L (ref 3.5–5.1)
Sodium: 136 mEq/L (ref 136–145)
Total Bilirubin: 0.3 mg/dl (ref 0.2–1.0)
Total Protein: 6.3 gm/dl — ABNORMAL LOW (ref 6.4–8.2)

## 2018-04-07 LAB — GGT
GGT: 94 U/L — ABNORMAL HIGH (ref 5–85)
GGT: 94 U/L — ABNORMAL HIGH (ref 5–85)

## 2018-04-07 LAB — METABOLIC PANEL, COMPREHENSIVE
ALT (SGPT): 15 U/L (ref 12–78)
AST (SGOT): 23 U/L (ref 15–37)
Albumin: 2.4 gm/dl — ABNORMAL LOW (ref 3.4–5.0)
Alk. phosphatase: 135 U/L — ABNORMAL HIGH (ref 45–117)
Anion gap: 6 mmol/L (ref 5–15)
BUN: 2 mg/dl — ABNORMAL LOW (ref 7–25)
Bilirubin, total: 0.3 mg/dl (ref 0.2–1.0)
CO2: 26 mEq/L (ref 21–32)
Calcium: 8.4 mg/dl — ABNORMAL LOW (ref 8.5–10.1)
Chloride: 105 mEq/L (ref 98–107)
Creatinine: 0.5 mg/dl — ABNORMAL LOW (ref 0.6–1.3)
GFR est AA: >60
GFR est non-AA: >60
Glucose: 95 mg/dl (ref 74–106)
Potassium: 4 mEq/L (ref 3.5–5.1)
Protein, total: 6.3 gm/dl — ABNORMAL LOW (ref 6.4–8.2)
Sodium: 136 mEq/L (ref 136–145)

## 2018-04-07 MED ORDER — CYANOCOBALAMIN 1,000 MCG/ML IJ SOLN
1000 mcg/mL | Freq: Once | INTRAMUSCULAR | Status: AC
Start: 2018-04-07 — End: 2018-04-07
  Administered 2018-04-07: 21:00:00 via SUBCUTANEOUS

## 2018-04-07 MED ORDER — NALOXONE 0.4 MG/ML INJECTION
0.4 mg/mL | INTRAMUSCULAR | Status: DC | PRN
Start: 2018-04-07 — End: 2018-04-12

## 2018-04-07 MED ORDER — THIAMINE 100 MG/ML INJECTION
100 mg/mL | Freq: Every day | INTRAMUSCULAR | Status: AC
Start: 2018-04-07 — End: 2018-04-10
  Administered 2018-04-08 – 2018-04-10 (×3): via INTRAMUSCULAR

## 2018-04-07 MED ORDER — PEDIATRIC MULTIVITAMINS-IRON CHEWABLE TAB
Freq: Two times a day (BID) | ORAL | Status: DC
Start: 2018-04-07 — End: 2018-04-12
  Administered 2018-04-08 – 2018-04-12 (×10): via ORAL

## 2018-04-07 MED ORDER — BISACODYL 10 MG RECTAL SUPPOSITORY
10 mg | Freq: Every day | RECTAL | Status: DC | PRN
Start: 2018-04-07 — End: 2018-04-12
  Administered 2018-04-10: 17:00:00 via RECTAL

## 2018-04-07 MED ORDER — DOCUSATE SODIUM 100 MG CAP
100 mg | Freq: Two times a day (BID) | ORAL | Status: DC
Start: 2018-04-07 — End: 2018-04-12
  Administered 2018-04-07 – 2018-04-12 (×9): via ORAL

## 2018-04-07 MED ORDER — PANTOPRAZOLE 40 MG IV SOLR
40 mg | INTRAVENOUS | Status: DC
Start: 2018-04-07 — End: 2018-04-12
  Administered 2018-04-07 – 2018-04-11 (×5): via INTRAVENOUS

## 2018-04-07 MED ORDER — POLYETHYLENE GLYCOL 3350 17 GRAM (100 %) ORAL POWDER PACKET
17 gram | Freq: Two times a day (BID) | ORAL | Status: DC
Start: 2018-04-07 — End: 2018-04-12
  Administered 2018-04-07 – 2018-04-12 (×10): via ORAL

## 2018-04-07 MED ORDER — GADOBUTROL 1 MMOL/ML (604.72 MG/ML) IV
1 mmol/mL (604.72 mg/mL) | Freq: Once | INTRAVENOUS | Status: AC
Start: 2018-04-07 — End: 2018-04-06
  Administered 2018-04-07: 04:00:00 via INTRAVENOUS

## 2018-04-07 MED ORDER — PIPERACILLIN-TAZOBACTAM 3.375 GRAM IV SOLR
3.375 gram | Freq: Three times a day (TID) | INTRAVENOUS | Status: DC
Start: 2018-04-07 — End: 2018-04-08
  Administered 2018-04-07 – 2018-04-08 (×3): via INTRAVENOUS

## 2018-04-07 MED ORDER — OXYCODONE 5 MG TAB
5 mg | ORAL | Status: DC | PRN
Start: 2018-04-07 — End: 2018-04-12
  Administered 2018-04-07 – 2018-04-12 (×21): via ORAL

## 2018-04-07 MED FILL — ONDANSETRON (PF) 4 MG/2 ML INJECTION: 4 mg/2 mL | INTRAMUSCULAR | Qty: 2

## 2018-04-07 MED FILL — BD POSIFLUSH NORMAL SALINE 0.9 % INJECTION SYRINGE: INTRAMUSCULAR | Qty: 10

## 2018-04-07 MED FILL — FERROUS SULFATE 325 MG (65 MG ELEMENTAL IRON) TAB: 325 mg (65 mg iron) | ORAL | Qty: 1

## 2018-04-07 MED FILL — POLYETHYLENE GLYCOL 3350 17 GRAM (100 %) ORAL POWDER PACKET: 17 gram | ORAL | Qty: 1

## 2018-04-07 MED FILL — PIPERACILLIN-TAZOBACTAM 3.375 GRAM IV SOLR: 3.375 gram | INTRAVENOUS | Qty: 3.38

## 2018-04-07 MED FILL — HYDROMORPHONE 1 MG/ML INJECTION SOLUTION: 1 mg/mL | INTRAMUSCULAR | Qty: 1

## 2018-04-07 MED FILL — GADAVIST 1 MMOL/ML (604.72 MG/ML) INTRAVENOUS SOLUTION: 1 mmol/mL (604.72 mg/mL) | INTRAVENOUS | Qty: 10

## 2018-04-07 MED FILL — DOCUSATE SODIUM 100 MG CAP: 100 mg | ORAL | Qty: 1

## 2018-04-07 MED FILL — OXYCODONE 5 MG TAB: 5 mg | ORAL | Qty: 1

## 2018-04-07 MED FILL — PANTOPRAZOLE 40 MG IV SOLR: 40 mg | INTRAVENOUS | Qty: 40

## 2018-04-07 MED FILL — CYANOCOBALAMIN 1,000 MCG/ML IJ SOLN: 1000 mcg/mL | INTRAMUSCULAR | Qty: 1

## 2018-04-07 MED FILL — DICYCLOMINE 10 MG CAP: 10 mg | ORAL | Qty: 1

## 2018-04-07 NOTE — Progress Notes (Signed)
Bedside and Verbal shift change report given to Nila Nephew   (oncoming nurse) by Lester Carolina, RN (offgoing nurse). Report included the following information SBAR, Kardex, and MAR.      Problem: Falls - Risk of  Goal: *Absence of Falls  Description  Document Ashley [name] Fall Risk and appropriate interventions in the flowsheet.  Outcome: Progressing Towards Goal  Note: Fall Risk Interventions:  Mobility Interventions: Assess mobility with egress test         Medication Interventions: Bed/chair exit alarm

## 2018-04-07 NOTE — Progress Notes (Signed)
Patient still having severe pain. Pain medications given. Pt able to keep a little food down today. No c/o N&V. LR running at 75.  Patient ambulating in halls, x2 laps. Will be NPO at midnight. Will continue to monitor.

## 2018-04-07 NOTE — Progress Notes (Signed)
Problem: Falls - Risk of  Goal: *Absence of Falls  Description  Document Schmid Fall Risk and appropriate interventions in the flowsheet.  Outcome: Progressing Towards Goal  Note: Fall Risk Interventions:  Mobility Interventions: Bed/chair exit alarm, Patient to call before getting OOB         Medication Interventions: Bed/chair exit alarm, Patient to call before getting OOB, Teach patient to arise slowly                   Problem: Patient Education: Go to Patient Education Activity  Goal: Patient/Family Education  Outcome: Progressing Towards Goal

## 2018-04-07 NOTE — Consults (Signed)
[facility]  CONSULTATION REPORT  NAME:  Ashley [name]  SEX:   F  ADMIT: 04/03/2018  DATE OF CONSULT: 04/07/2018  REFERRING PHYSICIAN:    DOB: 07/17/1985  MR#    [id number]  ROOM:  [id number]  ACCT#  [id number]    cc: Charlyn Minerva MD, Cipriano Bunker MD, Albertine Patricia DO    DATE OF ADMISSION:  04/03/2018    DATE OF CONSULTATION  04/07/2018     REASON FOR CONSULTATION:    Right upper quadrant pain in patient with gastric bypass.    DIAGNOSES:  1.  Right upper quadrant abdominal pain for 3 weeks with duodenal thickening on CT and periduodenal fluid on MRI.  Because of her gastric bypass it not accessible by endoscopy and if it is a possible duodenal ulcer with contained perforation I recommend acid suppression with PPI, trial of antibiotics and follow.  If it worsens, repeat CT scan.  The duodenum can be scoped via gastrotomy if necessary.  I would also recommend checking her H. pylori antibodies and she needs to completely abstain from aspirin, nonsteroidals and smoking which she just quit 2 months ago.  2.  History of gastric bypass in 2012 with a weight regain and poor compliance with supplements.  She needs to resume her multivitamins, calcium and also B12 and will order empiric thiamine and B12 while she is in the hospital.  3.  No gallbladder stones on CT or MRI, but I think it is worth getting an ultrasound.  4.  Obesity with a body mass index of 38.  5.  Anemia.  6.  History of sleep apnea, which has been partially relieved by weight loss.  7.  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  8.  Stress urinary incontinence.    HISTORY OF PRESENT ILLNESS:  The patient is a 33 year old black female with a history of laparoscopic gastric bypass in 2012 done in [place] with a preoperative weight of 370 pounds, a low weight of 150 pounds and then gradual regain of weight to her current weight of 196 pounds.  Over the past 3 weeks she has developed a dull persistent right upper quadrant pain that radiates  straight through to her back.  She has had intermittent low-grade nausea, fevers, chills and sweats.  Her bowels have been irregular.  She has no history of jaundice, dark urine, light stools, or pancreatitis.  No known gallbladder disease.  Of note, with her gastric bypass that she has been taking her supplements just intermittently.  She was also smoking until 2 months ago and using BC Powder intermittently.  No previous history of ulcer disease.    ALLERGIES:  NONE.    MEDICATIONS:  Only admission medications were multivitamins which she took sporadically and BC Powder intermittently.      MEDICAL HISTORY:  Significant for the morbid obesity with a gastric bypass in 2012, sleep apnea, prior to her gastric bypass and has been relieved by weight loss, anemia, weight-related joint pain involving her knees and stress urinary incontinence.  There is no history of hypertension, diabetes or other pulmonary disease.    PAST SURGICAL HISTORY:  Laparoscopic gastric bypass in 2012.  C-sections in 2004, 2002, 2008 and 2013.    FAMILY AND SOCIAL HISTORY:  She is gravida 4, para 4.  She quit smoking 2 months ago.  Drinks alcohol occasionally.    FAMILY HISTORY:  Significant for father with coronary artery disease, elevated cholesterol and hypertension.  Mother with diabetes and alcohol  abuse.    REVIEW OF SYSTEMS:  The patient works as a traveling Lawyer.  She is fairly active with unlimited walk exercise tolerance, mild fatigue and very mild exertional shortness of breath but no chest pain.  She does have occasional palpitations.  No PND or orthopnea.  No respiratory symptoms.  Sleep apnea symptoms prior to her gastric bypass and had been relieved.  GI symptoms as noted in present illness.  No significant reflux.  Her satiety is with about 6 ounces of food.  She does have some mild dumping symptoms with sugary foods and avoids them.  Her pain is exacerbated by eating.  There is no history of jaundice, dark urine, light stools,  or pancreatitis.  She had a transient elevated lipase on admission that was brief and transient.  Urinary symptoms consist of mild stress incontinence.  Extremity symptoms consist of mild knee pain.    PHYSICAL EXAMINATION:  GENERAL:  She is a well appearing black female.  She is overweight.  VITAL SIGNS:  Blood pressure is 119/75, pulse 86, respirations 18, temperature is 37.  Weight is 196 pounds with a height of 5 feet 0 inches and a body mass index of 38, sats are 95% on room air.  HEENT:  Sclerae nonicteric.  Ears and nose and throat are clear.  NECK:  Supple, without adenopathy.  Carotids are 2+/4 without bruits.  She has no jugular venous distention.  Airway is clinically normal.  CHEST:  Clear to percussion and auscultation.  HEART:  Regular rate and rhythm without murmurs, gallops or rubs.  Peripheral pulses are intact.  ABDOMEN:  Shows umbilical and upper abdominal laparoscopy scars as well as a low transverse scar.  She has minimal deep tenderness in the epigastrium and medial right upper quadrant with no guarding, no palpable mass.  EXTREMITIES:  Warm and well perfused.    PERTINENT TESTING:  CT scan was done on 04/03/2018 showing gastric bypass anatomy and duodenal thickening but no fluid in the region of the duodenum.  MRI was done 04/03/2018 showing periduodenal fluid collection.  No gallbladder stones.  Endoscopy was done on 04/06/2018 showing normal gastric bypass anatomy but it never reached the jejunojejunostomy because of length.  White count of 6.2, hemoglobin and hematocrit of 9/28 with a platelet count of 284,000 and a normal differential.  Electrolytes are normal.  LFTs are normal.  Albumin is 2.4.     IMPRESSION AND RECOMMENDATIONS:  1.  Right upper quadrant abdominal pain for 3 weeks with duodenal thickening on CT and periduodenal fluid on MRI, but not on the CT.  Because of her gastric bypass anatomy was inaccessible to EGD and I think we need to treat it as a possible duodenal ulcer with  contained perforations.  I recommend acid suppression with PPI, a trial of antibiotics and follow.  If it worsens, we can repeat her CT and consider percutaneous drainage.  The duodenum can also be scoped if necessary via a gastrotomy.  Also recommend checking H. pylori antibodies and completely avoid aspirin, nonsteroidals or smoking which she just quit 2 months ago.  2.  History of gastric bypass with weight regain and poor compliance with supplements.  She resume her multivitamins, calcium and also give her empiric thiamine and B12.  3.  No gallstones on the CT or MRI but I think it is worth getting an ultrasound.  3.  Obesity with a body mass index of 38.  4.  Anemia.  5.  History of  sleep apnea, relieved by weight loss.  6.  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  7.  Stress urinary incontinence.    RECOMMENDATIONS:  As above, will continue to follow her while she is in the hospital with further recommendations pending her course.      ___________________  Margarita Grizzle MD  Dictated By:.   mad  D:04/07/2018 15:44:47  T: 04/07/2018 16:16:47  [id number]

## 2018-04-07 NOTE — Consults (Signed)
Surg Consult    Dictated [phone]    RUQ abd painx3 wks with duodenal thickening on CT and periduodenal fluid on mri- because of her gastric bypass this is not accessible to egd. Possible duodenal ulcer with contained perforation.  Rec PPI, trial of Ab and follow. If worsens repeat CT; duodenum can be scoped via gastrotomy if necessary. Check H pylori Ab. No more asa, nsaids, or smoking (just quit x 2 mos). Adv diet as tol.  Hx gastric bypass 2012, wt re-gain and poor compliance with suppl., resume mvi, calcium citrate;  empiric B1, B12  No gb stones on ct or mri, get ultrasound  Obesity, bmi 38  Anemia  Hx OSA rel by wt loss  WRA- knees  SUI    Blood pressure 119/75, pulse 86, temperature 98.6 ??F (37 ??C), resp. rate 18, height 5' (1.524 m), weight 88.9 kg (196 lb), last menstrual period 04/01/2018, SpO2 95 %, unknown if currently breastfeeding.    CT 1/21- gastric bypass anatomy, duodenal thickening  MRI 1/24- periduodenal fluid  EGD 1/24- normal gastric bypass anatomy    Recent Results (from the past 24 hour(s))   METABOLIC PANEL, COMPREHENSIVE    Collection Time: 04/07/18 12:31 PM   Result Value Ref Range    Sodium 136 136 - 145 mEq/L    Potassium 4.0 3.5 - 5.1 mEq/L    Chloride 105 98 - 107 mEq/L    CO2 26 21 - 32 mEq/L    Glucose 95 74 - 106 mg/dl    BUN 2 (L) 7 - 25 mg/dl    Creatinine 0.5 (L) 0.6 - 1.3 mg/dl    GFR est AA >60.0      GFR est non-AA >60      Calcium 8.4 (L) 8.5 - 10.1 mg/dl    AST (SGOT) 23 15 - 37 U/L    ALT (SGPT) 15 12 - 78 U/L    Alk. phosphatase 135 (H) 45 - 117 U/L    Bilirubin, total 0.3 0.2 - 1.0 mg/dl    Protein, total 6.3 (L) 6.4 - 8.2 gm/dl    Albumin 2.4 (L) 3.4 - 5.0 gm/dl    Anion gap 6 5 - 15 mmol/L   GGT    Collection Time: 04/07/18 12:31 PM   Result Value Ref Range    GGT 94 (H) 5 - 85 U/L

## 2018-04-07 NOTE — Progress Notes (Signed)
INTERNAL MEDICINE PROGRESS NOTE  Patient: Ashley [name]   Date of Birth: 11/26/1985   MRN: [id number]        Assessment / The Hospital course:     Principle Problems:    Possible biliary dyskinesia vs. Choledocholithiasis vs. Choledolithiasis.  -MRCP today.  MRI  Of abdomen results pending.  - Will check GGT.  -await results, agree that  Lap .cholecystectomy may be definitive treatment.    RUQ , Epigastric pain radiating to the back, intermittent??  Gastritis and duodenitis, Possible anastomotic ulcer or penetrating PUD    Indeterminate lesion within the left medial hepatic lobe - 2.8 cm   - presented with RUQ , Epigastric pain radiating to the back, intermittent , associated with N/V , lipase normal  - h/o Roux-en-Y gastric bypass  - she diagnosed with pancreatitis few days ago, Lipase normal here was 446 on 1/19, pancrease normal on CT   - on admission afebrile, vitals stable, blood workup is unremarkable, CT abd showed Duodenitis   - started on PPI , GI consult, Plan for EGD and MRCP   Chronic NCNC anemia - IDA,  iron panel showed iron deficiency, Stable, Po iron added   ??  Constipation: opioid induced.  - No Bm since she has been here according to her.     Code Status: Full code   DVT prophylaxis: pharmacologic and mechanical  Disposition :??home    Today Plan:   Continue with PPI   GI plan for EGD, enteroscopy and MRCP   Add PO iron   Continue with PT/ambulation     Start laxatives,stool softeners.    Subjective:   She denies any n/v but still complaining of epigastric as well as RUQ pain.  Pain on palpation of RUQ with radiation into the back.     She states that she has not had a bm since she has been here.    Medical Decision Making   Chart, Images and Lab data reviewed, necessary medical Orders placed   Discussed with nursing staff and Case Manager. .     Vitals:    04/06/18 2026 04/06/18 2318 04/07/18 0451 04/07/18 0829   BP: 128/77 115/80 124/79 117/66   Pulse: 85 86 87 81   Resp: 18 18 18 18    Temp:  98.9 ??F (37.2 ??C) 98.7 ??F (37.1 ??C) 98 ??F (36.7 ??C) 98.6 ??F (37 ??C)   SpO2: 97% 99% 97% 98%   Weight:       Height:         Temp (24hrs), Avg:98.6 ??F (37 ??C), Min:98 ??F (36.7 ??C), Max:99.1 ??F (37.3 ??C)      Intake/Output Summary (Last 24 hours) at 04/07/2018 1110  Last data filed at 04/07/2018 0912  Gross per 24 hour   Intake 1542.5 ml   Output ???   Net 1542.5 ml       Physical Exam:   General Appearance:   Appears in no acute distress.,   Skin:   Skin warm & dry, No rash, No jaundice,   Lymph:  There is no lymphadenopathy,   HEENT:   PERRLA, EOMI, Moist oral mucous membranes, conjunctiva clear,   Neck:   Supple, Without masses,   Lungs:   Clear, No wheezes., No rales., Normal respiratory effort,   Heart:  Regular rate and rhythm, No gallop,   Abdomen:   Soft , Non-distended, Normal bowel sounds and Non-tender,   Extremities:   No edema of legs, Normal pedal and radial pulses,  Neuro:   alert, oriented, affect appropriate, speech fluent, cranial nerves intact, no focal neurological deficits and moves all extremities well    Current medications:     Current Facility-Administered Medications   Medication Dose Route Frequency   ??? ferrous sulfate tablet 325 mg  1 Tab Oral DAILY WITH BREAKFAST   ??? lactated Ringers infusion  75 mL/hr IntraVENous CONTINUOUS   ??? dicyclomine (BENTYL) capsule 10 mg  10 mg Oral Q6H PRN   ??? acetaminophen (TYLENOL) tablet 650 mg  650 mg Oral Q6H PRN   ??? sodium chloride (NS) flush 5-10 mL  5-10 mL IntraVENous Q8H   ??? sodium chloride (NS) flush 5-10 mL  5-10 mL IntraVENous PRN   ??? naloxone (NARCAN) injection 0.1 mg  0.1 mg IntraVENous PRN   ??? ondansetron (ZOFRAN) injection 4 mg  4 mg IntraVENous Q6H PRN   ??? HYDROmorphone (DILAUDID) injection 0.5 mg  0.5 mg IntraVENous Q4H PRN          Laboratory and Radiology Data :     No results found for this or any previous visit (from the past 24 hour(s)).    XR Results:  Results from Hospital Encounter encounter on 01/20/18   XR KNEE RT MIN 4 V    Narrative  Indication: 79 without injury    4 views right knee show no bony or soft tissue abnormality.      Impression IMPRESSION: Normal study.         CT Results:  Results from Hospital Encounter encounter on 04/03/18   CT ABD PELV W CONT    Narrative CT of the abdomen and pelvis     INDICATION:  RUQ pain.      COMPARISON: No relevant studies.    TECHNIQUE: CT of the abdomen and pelvis was performed with nonionic intravenous  contrast with coronal and sagittal reformatted images.    DICOM format image data is available to non-affiliated external healthcare  facilities or entities on a secure, media free, reciprocally searchable basis  with patient authorization for 12 months following the date of the study.    FINDINGS:    LOWER THORAX: There are bilateral lower lobe infiltrates, left greater than  right. Additional areas of subsegmental atelectasis.    ABDOMEN: There is no free air or free fluid. There is a slightly ill-defined 2.8  cm hypoenhancing lesion within the left medial hepatic lobe adjacent to the  falciform ligament for which MRI of the abdomen with and without contrast is  suggested. There is fatty infiltration of the liver. There are no radiopaque  gallstones. The adrenal glands are unremarkable. There is no hydronephrosis or  nephrolithiasis.    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 There is thickening of  the horizontal portion of the duodenum with surrounding free fluid and  inflammatory change. This is inferior to the uncinate process of the pancreas  and inflammation likely secondary to duodenal inflammation. Lipase normal on  chart review. No definite free air. No dilated loops of bowel.         PELVIS: There is no focal bladder wall thickening. The uterus is present.  Negative for aortic dissection.    OSSEOUS STRUCTURES: Acutely intact.      Impression IMPRESSION:    1.  Thickening of the horizontal portion of duodenum with surrounding  inflammatory changes. Findings likely reflect  duodenitis. Superimposed ulcer not  excluded. While inflammation is near the pancreas, lipase is normal in chart  review.  2.  Fatty infiltration of the liver. 2.8 cm indeterminate lesion within the left  medial hepatic lobe. MRI with and without contrast recommended.  3.  Bibasilar infiltrates/atelectasis.         MRI Results:  No results found for this or any previous visit.    Nuclear Medicine Results:  No results found for this or any previous visit.    [place] Results:  No results found for this or any previous visit.    IR Results:  No results found for this or any previous visit.    VAS/US Results:  Results from Hospital Encounter encounter on 01/20/18   DUPLEX LOWER EXT VENOUS RIGHT    Narrative                                                               Study ID:   [id number]                                                 [facility]                                            [address] [place],                                          [place]                                           [address]  Lower Extremity Venous Report    Name: CEARA, VENNE Date: 01/20/2018 08:00 PM  MRN: [id number]                      Patient Location: [id number]  DOB: Feb 25, 1986                  Age: 52 yrs  Gender: Female                   Account #: [id number]  Reason For Study: Right leg pain  Ordering Physician: Thea Silversmith    Performed By: Francee Piccolo, RVS    Interpretation Summary  No evidence of superficial or deep vein thrombosis noted in the right lower  extremity.  No evidence of deep venous thrombosis in the contralateral/left common    femoral  vein.  _____________________________________________________________________________  __      QUALITY/PROCEDURE  Limited unilateral venous duplex performed of the right leg. Quality of the  study is good. M79.604.    HISTORY/SYMPTOMS  Right leg pain.    RIGHT LEG  The common femoral, femoral, popliteal, posterior tibial and peroneal veins  were examined with duplex ultrasound. The deep veins were patent and  compressible with no evidence of intraluminal thrombus. Spontaneous, phasic  venous flow with normal augmentation noted throughout the right leg.    RIGHT SAPHENOUS VEINS  Spectral Doppler exam demonstrates normal venous flow in the right great  saphenous vein. Compression of the right great saphenous vein is complete.  Filling defects in the right great saphenous vein are absent.      LEFT LEG  Spectral Doppler exam demonstrates normal venous flow in the left common  femoral vein. Compression of the left common femoral vein is complete.   Filling  defects in the left common femoral vein are absent.      Electronically signed byDR Billie Ruddy, MD   01/21/2018 01:10 AM       Admission HPI:           Marnee Guarneri M.D.  [facility]  Page [phone]

## 2018-04-07 NOTE — Progress Notes (Signed)
Progress Notes by Robby Sermon, MD at 04/07/18 1217                Author: Robby Sermon, MD  Service: Gastroenterology  Author Type: Physician       Filed: 04/07/18 1228  Date of Service: 04/07/18 1217  Status: Signed          Editor: Robby Sermon, MD (Physician)                       Gastrointestinal Progress Note      Patient Name: Ashley [name]      Today's Date: 04/07/2018      Admit Date: 04/03/2018           Assessment:     1.  8.5 cm fluid collection versus abscess adjacent to the 3rd and 4th portions of the duodenum seen on MRI probably a sealed-off duodenal ulcer perforation  mimicking acute pancreatitis.  No MR evidence of gallstones or biliary dilatation.   2.  Hx of RNYGB surgery.  EGD with enteroscopy yesterday showing normal mucosa but unable to reach J-J anastomosis.   3.   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        Recommendation:     1.   Clear liquid diet.  Resume IV PPI.   2.   Consulted Dr. Forest Becker for surgical opinion and recommendations.   3.   Discussed with Dr. Laveda Norman and recommended to begin broad spectrum antibiotics.   4.   Will need IR drainage if clinical condition worsen or develops leukocytosis and fever.        Subjective:     Patient still c/o epigastric pain usually with meals.  No vomiting.  No fever, chills, diarrhea, rectal bleeding or melena.   MRI today showing am 8.5 cm fluid collection versus abscess at the junction of the 3rd and 4th portions of her duodenum with peripancreatic inflammation but no gallstones or biliary dilatation.        Current Facility-Administered Medications          Medication  Dose  Route  Frequency           ?  oxyCODONE IR (ROXICODONE) tablet 5 mg   5 mg  Oral  Q4H PRN     ?  docusate sodium (COLACE) capsule 100 mg   100 mg  Oral  BID     ?  polyethylene glycol (MIRALAX) packet 17 g   17 g  Oral  BID     ?  bisacodyL (DULCOLAX) suppository 10 mg   10 mg  Rectal  DAILY PRN     ?  naloxone (NARCAN)  injection 0.4 mg   0.4 mg  IntraVENous  EVERY 2 MINUTES AS NEEDED     ?  ferrous sulfate tablet 325 mg   1 Tab  Oral  DAILY WITH BREAKFAST     ?  lactated Ringers infusion   75 mL/hr  IntraVENous  CONTINUOUS     ?  dicyclomine (BENTYL) capsule 10 mg   10 mg  Oral  Q6H PRN     ?  acetaminophen (TYLENOL) tablet 650 mg   650 mg  Oral  Q6H PRN     ?  sodium chloride (NS) flush 5-10 mL   5-10 mL  IntraVENous  Q8H     ?  sodium chloride (NS) flush 5-10 mL   5-10 mL  IntraVENous  PRN     ?  naloxone (NARCAN) injection 0.1 mg   0.1 mg  IntraVENous  PRN     ?  ondansetron (ZOFRAN) injection 4 mg   4 mg  IntraVENous  Q6H PRN           ?  HYDROmorphone (DILAUDID) injection 0.5 mg   0.5 mg  IntraVENous  Q4H PRN                 Objective:        Visit Vitals      BP  117/66 (BP 1 Location: Right arm, BP Patient Position: Supine)     Pulse  81     Temp  98.6 ??F (37 ??C)     Resp  18     Ht  5' (1.524 m)     Wt  88.9 kg (196 lb)     LMP  04/01/2018 (Exact Date)     SpO2  98%        BMI  38.28 kg/m??                 Intake/Output Summary (Last 24 hours) at 04/07/2018 1217   Last data filed at 04/07/2018 0912     Gross per 24 hour        Intake  1542.5 ml        Output  --        Net  1542.5 ml           Examination:   Awake, oriented, coherent, not in distress.   Anicteric sclerae.   Abdomen with epigastric tenderness and mild guarding.  No mass, hepatosplenomegaly or ascites.   Warm extremities,  2+ pulses, no edema.      Data Review:      MRI ABDOMEN (04/06/2018):      1. Lesion within the liver visualized on previous CT is most consistent with   focal fatty infiltration. No aggressive process.   2. Prominent peripherally enhancing fluid collection adjacent to the pancreatic   head/uncinate process with extensive surrounding inflammatory change as   described. This is concerning for a developing abscess. This may be related to a   contained, ruptured ulcer. Other differential considerations may include   pancreatic pseudocyst with  possible superimposed inflammation of the pancreatic   head/uncinate process.   3. Subsegmental atelectasis within the lung bases.         Labs:  Results:        Chemistry  Recent Labs         04/06/18   0447  04/05/18   0352      GLU  78  78      NA  138  136      K  3.5  3.6      CL  105  107      CO2  26  23      BUN  3*  3*      CREA  0.6  0.5*      CA  8.7  8.4*      AGAP  7  6      AP  129*  114      TP  6.5  6.5      ALB  2.2*  2.3*          Estimated Creatinine Clearance: 113.5 mL/min (by C-G formula based on SCr of 0.6 mg/dL).     CBC w/Diff  Recent Labs         04/06/18   0447  04/05/18   0352      WBC  6.2  8.3      RBC  3.00*  3.05*      HGB  9.3*  9.5*      HCT  28.8*  29.0*      PLT  284  274      GRANS  60.1  70.4*      LYMPH  24.8*  16.9*      EOS  1.0  0.7              Coagulation  No results for input(s): PTP, INR, APTT, INREXT in the last 72 hours.         Hepatitis Panel  Lab Results      Component  Value  Date/Time        Hep B surface Ag screen  Negative  12/20/2010 12:00 AM                 Amylase Lipase  .        Liver Enzymes  Recent Labs         04/06/18   0447      TP  6.5      ALB  2.2*      AP  129*      SGOT  23      ALT  14                 Thyroid Studies  No results for input(s): T4, T3U, TSH, TSHEXT in the last 72 hours.      No lab exists for component: T3RU          Pathology  pathology           Bryla Burek P. Leonard Downingiongco, MD, FACP   Pager: [phone]   April 07, 2018

## 2018-04-07 NOTE — Progress Notes (Signed)
Patient still having severe pain. Pain medications given. Pt able to keep a little food down today. No c/o N&V. LR running at 75.  Patient ambulating in halls, x2 laps. Will be NPO at midnight. Will continue to monitor.

## 2018-04-07 NOTE — Progress Notes (Signed)
Gastrointestinal Progress Note    Patient Name: Ashley [name]    GNOIB'B Date: 04/07/2018    Admit Date: 04/03/2018      Assessment:   1.  8.5 cm fluid collection versus abscess adjacent to the 3rd and 4th portions of the duodenum seen on MRI probably a sealed-off duodenal ulcer perforation mimicking acute pancreatitis.  No MR evidence of gallstones or biliary dilatation.  2.  Hx of RNYGB surgery.  EGD with enteroscopy yesterday showing normal mucosa but unable to reach J-J anastomosis.  3.   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    Recommendation:   1.   Clear liquid diet.  Resume IV PPI.  2.   Consulted Dr. Forest Becker for surgical opinion and recommendations.  3.   Discussed with Dr. Laveda Norman and recommended to begin broad spectrum antibiotics.  4.   Will need IR drainage if clinical condition worsen or develops leukocytosis and fever.    Subjective:   Patient still c/o epigastric pain usually with meals.  No vomiting.  No fever, chills, diarrhea, rectal bleeding or melena.  MRI today showing am 8.5 cm fluid collection versus abscess at the junction of the 3rd and 4th portions of her duodenum with peripancreatic inflammation but no gallstones or biliary dilatation.    Current Facility-Administered Medications   Medication Dose Route Frequency   ??? oxyCODONE IR (ROXICODONE) tablet 5 mg  5 mg Oral Q4H PRN   ??? docusate sodium (COLACE) capsule 100 mg  100 mg Oral BID   ??? polyethylene glycol (MIRALAX) packet 17 g  17 g Oral BID   ??? bisacodyL (DULCOLAX) suppository 10 mg  10 mg Rectal DAILY PRN   ??? naloxone (NARCAN) injection 0.4 mg  0.4 mg IntraVENous EVERY 2 MINUTES AS NEEDED   ??? ferrous sulfate tablet 325 mg  1 Tab Oral DAILY WITH BREAKFAST   ??? lactated Ringers infusion  75 mL/hr IntraVENous CONTINUOUS   ??? dicyclomine (BENTYL) capsule 10 mg  10 mg Oral Q6H PRN   ??? acetaminophen (TYLENOL) tablet 650 mg  650 mg Oral Q6H PRN   ??? sodium chloride (NS) flush 5-10 mL  5-10 mL IntraVENous Q8H    ??? sodium chloride (NS) flush 5-10 mL  5-10 mL IntraVENous PRN   ??? naloxone (NARCAN) injection 0.1 mg  0.1 mg IntraVENous PRN   ??? ondansetron (ZOFRAN) injection 4 mg  4 mg IntraVENous Q6H PRN   ??? HYDROmorphone (DILAUDID) injection 0.5 mg  0.5 mg IntraVENous Q4H PRN          Objective:     Visit Vitals  BP 117/66 (BP 1 Location: Right arm, BP Patient Position: Supine)   Pulse 81   Temp 98.6 ??F (37 ??C)   Resp 18   Ht 5' (1.524 m)   Wt 88.9 kg (196 lb)   LMP 04/01/2018 (Exact Date)   SpO2 98%   BMI 38.28 kg/m??           Intake/Output Summary (Last 24 hours) at 04/07/2018 1217  Last data filed at 04/07/2018 0912  Gross per 24 hour   Intake 1542.5 ml   Output ???   Net 1542.5 ml       Examination:  Awake, oriented, coherent, not in distress.  Anicteric sclerae.  Abdomen with epigastric tenderness and mild guarding.  No mass, hepatosplenomegaly or ascites.  Warm extremities,  2+ pulses, no edema.    Data Review:    MRI ABDOMEN (04/06/2018):  1. Lesion within the liver visualized on previous CT is most consistent with  focal fatty infiltration. No aggressive process.  2. Prominent peripherally enhancing fluid collection adjacent to the pancreatic  head/uncinate process with extensive surrounding inflammatory change as  described. This is concerning for a developing abscess. This may be related to a  contained, ruptured ulcer. Other differential considerations may include  pancreatic pseudocyst with possible superimposed inflammation of the pancreatic  head/uncinate process.  3. Subsegmental atelectasis within the lung bases.    Labs: Results:   Chemistry Recent Labs     04/06/18  0447 04/05/18  0352   GLU 78 78   NA 138 136   K 3.5 3.6   CL 105 107   CO2 26 23   BUN 3* 3*   CREA 0.6 0.5*   CA 8.7 8.4*   AGAP 7 6   AP 129* 114   TP 6.5 6.5   ALB 2.2* 2.3*    Estimated Creatinine Clearance: 113.5 mL/min (by C-G formula based on SCr of 0.6 mg/dL).   CBC w/Diff Recent Labs     04/06/18  0447 04/05/18  0352   WBC 6.2 8.3    RBC 3.00* 3.05*   HGB 9.3* 9.5*   HCT 28.8* 29.0*   PLT 284 274   GRANS 60.1 70.4*   LYMPH 24.8* 16.9*   EOS 1.0 0.7      Coagulation No results for input(s): PTP, INR, APTT, INREXT in the last 72 hours.    Hepatitis Panel Lab Results   Component Value Date/Time    Hep B surface Ag screen Negative 12/20/2010 12:00 AM      Amylase Lipase .   Liver Enzymes Recent Labs     04/06/18  0447   TP 6.5   ALB 2.2*   AP 129*   SGOT 23   ALT 14      Thyroid Studies No results for input(s): T4, T3U, TSH, TSHEXT in the last 72 hours.    No lab exists for component: T3RU     Pathology pathology       Cloteal Isaacson P. Leonard Downing, MD, FACP  Pager: [phone]  April 07, 2018

## 2018-04-07 NOTE — Progress Notes (Signed)
INTERNAL MEDICINE PROGRESS NOTE  Patient: Ashley [name]   Date of Birth: 08-09-1985   MRN: [id number]        Assessment / The Hospital course:     Principle Problems:    Possible biliary dyskinesia vs. Choledocholithiasis vs. Choledolithiasis.  -MRCP today.  MRI  Of abdomen results pending.  - Will check GGT.  -await results, agree that  Lap .cholecystectomy may be definitive treatment.    RUQ , Epigastric pain radiating to the back, intermittent??  Gastritis and duodenitis, Possible anastomotic ulcer or penetrating PUD    Indeterminate lesion within the left medial hepatic lobe - 2.8 cm   - presented with RUQ , Epigastric pain radiating to the back, intermittent , associated with N/V , lipase normal  - h/o Roux-en-Y gastric bypass  - she diagnosed with pancreatitis few days ago, Lipase normal here was 446 on 1/19, pancrease normal on CT   - on admission afebrile, vitals stable, blood workup is unremarkable, CT abd showed Duodenitis   - started on PPI , GI consult, Plan for EGD and MRCP   Chronic NCNC anemia - IDA,  iron panel showed iron deficiency, Stable, Po iron added   ??  Constipation: opioid induced.  - No Bm since she has been here according to her.     Code Status: Full code   DVT prophylaxis: pharmacologic and mechanical  Disposition :??home    Today Plan:   Continue with PPI   GI plan for EGD, enteroscopy and MRCP   Add PO iron   Continue with PT/ambulation     Start laxatives,stool softeners.    Subjective:   She denies any n/v but still complaining of epigastric as well as RUQ pain.  Pain on palpation of RUQ with radiation into the back.     She states that she has not had a bm since she has been here.    Medical Decision Making   Chart, Images and Lab data reviewed, necessary medical Orders placed   Discussed with nursing staff and Case Manager. .     Vitals:    04/06/18 2026 04/06/18 2318 04/07/18 0451 04/07/18 0829   BP: 128/77 115/80 124/79 117/66   Pulse: 85 86 87 81   Resp: 18 18 18 18     Temp: 98.9 ??F (37.2 ??C) 98.7 ??F (37.1 ??C) 98 ??F (36.7 ??C) 98.6 ??F (37 ??C)   SpO2: 97% 99% 97% 98%   Weight:       Height:         Temp (24hrs), Avg:98.6 ??F (37 ??C), Min:98 ??F (36.7 ??C), Max:99.1 ??F (37.3 ??C)      Intake/Output Summary (Last 24 hours) at 04/07/2018 1110  Last data filed at 04/07/2018 0912  Gross per 24 hour   Intake 1542.5 ml   Output ???   Net 1542.5 ml       Physical Exam:   General Appearance:   Appears in no acute distress.,   Skin:   Skin warm & dry, No rash, No jaundice,   Lymph:  There is no lymphadenopathy,   HEENT:   PERRLA, EOMI, Moist oral mucous membranes, conjunctiva clear,   Neck:   Supple, Without masses,   Lungs:   Clear, No wheezes., No rales., Normal respiratory effort,   Heart:  Regular rate and rhythm, No gallop,   Abdomen:   Soft , Non-distended, Normal bowel sounds and Non-tender,   Extremities:   No edema of legs, Normal pedal and radial pulses,  Neuro:   alert, oriented, affect appropriate, speech fluent, cranial nerves intact, no focal neurological deficits and moves all extremities well    Current medications:     Current Facility-Administered Medications   Medication Dose Route Frequency   ??? ferrous sulfate tablet 325 mg  1 Tab Oral DAILY WITH BREAKFAST   ??? lactated Ringers infusion  75 mL/hr IntraVENous CONTINUOUS   ??? dicyclomine (BENTYL) capsule 10 mg  10 mg Oral Q6H PRN   ??? acetaminophen (TYLENOL) tablet 650 mg  650 mg Oral Q6H PRN   ??? sodium chloride (NS) flush 5-10 mL  5-10 mL IntraVENous Q8H   ??? sodium chloride (NS) flush 5-10 mL  5-10 mL IntraVENous PRN   ??? naloxone (NARCAN) injection 0.1 mg  0.1 mg IntraVENous PRN   ??? ondansetron (ZOFRAN) injection 4 mg  4 mg IntraVENous Q6H PRN   ??? HYDROmorphone (DILAUDID) injection 0.5 mg  0.5 mg IntraVENous Q4H PRN          Laboratory and Radiology Data :     No results found for this or any previous visit (from the past 24 hour(s)).    XR Results:  Results from Hospital Encounter encounter on 01/20/18   XR KNEE RT MIN 4 V     Narrative Indication: 79 without injury    4 views right knee show no bony or soft tissue abnormality.      Impression IMPRESSION: Normal study.         CT Results:  Results from Hospital Encounter encounter on 04/03/18   CT ABD PELV W CONT    Narrative CT of the abdomen and pelvis     INDICATION:  RUQ pain.      COMPARISON: No relevant studies.    TECHNIQUE: CT of the abdomen and pelvis was performed with nonionic intravenous  contrast with coronal and sagittal reformatted images.    DICOM format image data is available to non-affiliated external healthcare  facilities or entities on a secure, media free, reciprocally searchable basis  with patient authorization for 12 months following the date of the study.    FINDINGS:    LOWER THORAX: There are bilateral lower lobe infiltrates, left greater than  right. Additional areas of subsegmental atelectasis.    ABDOMEN: There is no free air or free fluid. There is a slightly ill-defined 2.8  cm hypoenhancing lesion within the left medial hepatic lobe adjacent to the  falciform ligament for which MRI of the abdomen with and without contrast is  suggested. There is fatty infiltration of the liver. There are no radiopaque  gallstones. The adrenal glands are unremarkable. There is no hydronephrosis or  nephrolithiasis.    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 There is thickening of  the horizontal portion of the duodenum with surrounding free fluid and  inflammatory change. This is inferior to the uncinate process of the pancreas  and inflammation likely secondary to duodenal inflammation. Lipase normal on  chart review. No definite free air. No dilated loops of bowel.         PELVIS: There is no focal bladder wall thickening. The uterus is present.  Negative for aortic dissection.    OSSEOUS STRUCTURES: Acutely intact.      Impression IMPRESSION:    1.  Thickening of the horizontal portion of duodenum with surrounding   inflammatory changes. Findings likely reflect duodenitis. Superimposed ulcer not  excluded. While inflammation is near the pancreas, lipase is normal in chart  review.  2.  Fatty infiltration of the liver. 2.8 cm indeterminate lesion within the left  medial hepatic lobe. MRI with and without contrast recommended.  3.  Bibasilar infiltrates/atelectasis.         MRI Results:  No results found for this or any previous visit.    Nuclear Medicine Results:  No results found for this or any previous visit.    [place] Results:  No results found for this or any previous visit.    IR Results:  No results found for this or any previous visit.    VAS/US Results:  Results from Hospital Encounter encounter on 01/20/18   DUPLEX LOWER EXT VENOUS RIGHT    Narrative                                                               Study ID:   [id number]                                                 [facility]                                            [address] [place],                                          [place]                                           [address]  Lower Extremity Venous Report    Name: Melina CopaOWENS, Angelly L           Study Date: 01/20/2018 08:00 PM  MRN: [id number]                      Patient Location: [id number]^[id number]  DOB: 27-Mar-1985                  Age: [age] yrs  Gender: Female                   Account #: [id number]  Reason For Study: Right leg pain  Ordering Physician: Thea SilversmithGREGORY, SARAH    Performed By: Francee PiccoloBlanchard, Donna, RVS    Interpretation Summary  No evidence of superficial or deep vein thrombosis noted in the right lower  extremity.  No evidence of deep venous thrombosis in the contralateral/left common   femoral  vein.   _____________________________________________________________________________  __      QUALITY/PROCEDURE  Limited unilateral venous duplex performed of the right leg. Quality of the  study is good. M79.604.    HISTORY/SYMPTOMS  Right leg pain.    RIGHT LEG  The common femoral, femoral, popliteal, posterior tibial and peroneal veins  were examined with duplex ultrasound. The deep veins were patent and  compressible with no evidence of intraluminal thrombus. Spontaneous, phasic  venous flow with normal augmentation noted throughout the right leg.    RIGHT SAPHENOUS VEINS  Spectral Doppler exam demonstrates normal venous flow in the right great  saphenous vein. Compression of the right great saphenous vein is complete.  Filling defects in the right great saphenous vein are absent.      LEFT LEG  Spectral Doppler exam demonstrates normal venous flow in the left common  femoral vein. Compression of the left common femoral vein is complete.   Filling  defects in the left common femoral vein are absent.      Electronically signed byDR Billie RuddyFrederick Southern, MD   01/21/2018 01:10 AM       Admission HPI:           Marnee GuarneriMustafa [place] M.D.  [facility]ist[facility]  Page [phone]

## 2018-04-07 NOTE — Progress Notes (Signed)
Bedside and Verbal shift change report given to Keosha L Barnes   (oncoming nurse) by Crystal G, RN (offgoing nurse). Report included the following information SBAR, Kardex, and MAR.      Problem: Falls - Risk of  Goal: *Absence of Falls  Description  Document Schmid Fall Risk and appropriate interventions in the flowsheet.  Outcome: Progressing Towards Goal  Note: Fall Risk Interventions:  Mobility Interventions: Assess mobility with egress test         Medication Interventions: Bed/chair exit alarm

## 2018-04-07 NOTE — Consults (Signed)
[facility]  CONSULTATION REPORT  NAME:  Ashley [name]  SEX:   F  ADMIT: 04/03/2018  DATE OF CONSULT: 04/07/2018  REFERRING PHYSICIAN:    DOB: 07/17/1985  MR#    [id number]  ROOM:  [id number]  ACCT#  [id number]    cc: Charlyn Minerva MD, Cipriano Bunker MD, Albertine Patricia DO    DATE OF ADMISSION:  04/03/2018    DATE OF CONSULTATION  04/07/2018     REASON FOR CONSULTATION:    Right upper quadrant pain in patient with gastric bypass.    DIAGNOSES:  1.  Right upper quadrant abdominal pain for 3 weeks with duodenal thickening on CT and periduodenal fluid on MRI.  Because of her gastric bypass it not accessible by endoscopy and if it is a possible duodenal ulcer with contained perforation I recommend acid suppression with PPI, trial of antibiotics and follow.  If it worsens, repeat CT scan.  The duodenum can be scoped via gastrotomy if necessary.  I would also recommend checking her H. pylori antibodies and she needs to completely abstain from aspirin, nonsteroidals and smoking which she just quit 2 months ago.  2.  History of gastric bypass in 2012 with a weight regain and poor compliance with supplements.  She needs to resume her multivitamins, calcium and also B12 and will order empiric thiamine and B12 while she is in the hospital.  3.  No gallbladder stones on CT or MRI, but I think it is worth getting an ultrasound.  4.  Obesity with a body mass index of 38.  5.  Anemia.  6.  History of sleep apnea, which has been partially relieved by weight loss.  7.  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  8.  Stress urinary incontinence.    HISTORY OF PRESENT ILLNESS:  The patient is a 33 year old black female with a history of laparoscopic gastric bypass in 2012 done in [place] with a preoperative weight of 370 pounds, a low weight of 150 pounds and then gradual regain of weight to her current weight of 196 pounds.  Over the past 3 weeks she has developed a dull persistent right upper quadrant pain that radiates  straight through to her back.  She has had intermittent low-grade nausea, fevers, chills and sweats.  Her bowels have been irregular.  She has no history of jaundice, dark urine, light stools, or pancreatitis.  No known gallbladder disease.  Of note, with her gastric bypass that she has been taking her supplements just intermittently.  She was also smoking until 2 months ago and using BC Powder intermittently.  No previous history of ulcer disease.    ALLERGIES:  NONE.    MEDICATIONS:  Only admission medications were multivitamins which she took sporadically and BC Powder intermittently.      MEDICAL HISTORY:  Significant for the morbid obesity with a gastric bypass in 2012, sleep apnea, prior to her gastric bypass and has been relieved by weight loss, anemia, weight-related joint pain involving her knees and stress urinary incontinence.  There is no history of hypertension, diabetes or other pulmonary disease.    PAST SURGICAL HISTORY:  Laparoscopic gastric bypass in 2012.  C-sections in 2004, 2002, 2008 and 2013.    FAMILY AND SOCIAL HISTORY:  She is gravida 4, para 4.  She quit smoking 2 months ago.  Drinks alcohol occasionally.    FAMILY HISTORY:  Significant for father with coronary artery disease, elevated cholesterol and hypertension.  Mother with diabetes and alcohol  abuse.    REVIEW OF SYSTEMS:  The patient works as a traveling Lawyer.  She is fairly active with unlimited walk exercise tolerance, mild fatigue and very mild exertional shortness of breath but no chest pain.  She does have occasional palpitations.  No PND or orthopnea.  No respiratory symptoms.  Sleep apnea symptoms prior to her gastric bypass and had been relieved.  GI symptoms as noted in present illness.  No significant reflux.  Her satiety is with about 6 ounces of food.  She does have some mild dumping symptoms with sugary foods and avoids them.  Her pain is exacerbated by eating.  There is no history of  jaundice, dark urine, light stools, or pancreatitis.  She had a transient elevated lipase on admission that was brief and transient.  Urinary symptoms consist of mild stress incontinence.  Extremity symptoms consist of mild knee pain.    PHYSICAL EXAMINATION:  GENERAL:  She is a well appearing black female.  She is overweight.  VITAL SIGNS:  Blood pressure is 119/75, pulse 86, respirations 18, temperature is 37.  Weight is 196 pounds with a height of 5 feet 0 inches and a body mass index of 38, sats are 95% on room air.  HEENT:  Sclerae nonicteric.  Ears and nose and throat are clear.  NECK:  Supple, without adenopathy.  Carotids are 2+/4 without bruits.  She has no jugular venous distention.  Airway is clinically normal.  CHEST:  Clear to percussion and auscultation.  HEART:  Regular rate and rhythm without murmurs, gallops or rubs.  Peripheral pulses are intact.  ABDOMEN:  Shows umbilical and upper abdominal laparoscopy scars as well as a low transverse scar.  She has minimal deep tenderness in the epigastrium and medial right upper quadrant with no guarding, no palpable mass.  EXTREMITIES:  Warm and well perfused.    PERTINENT TESTING:  CT scan was done on 04/03/2018 showing gastric bypass anatomy and duodenal thickening but no fluid in the region of the duodenum.  MRI was done 04/03/2018 showing periduodenal fluid collection.  No gallbladder stones.  Endoscopy was done on 04/06/2018 showing normal gastric bypass anatomy but it never reached the jejunojejunostomy because of length.  White count of 6.2, hemoglobin and hematocrit of 9/28 with a platelet count of 284,000 and a normal differential.  Electrolytes are normal.  LFTs are normal.  Albumin is 2.4.     IMPRESSION AND RECOMMENDATIONS:  1.  Right upper quadrant abdominal pain for 3 weeks with duodenal thickening on CT and periduodenal fluid on MRI, but not on the CT.  Because of her gastric bypass anatomy was inaccessible to EGD and I think  we need to treat it as a possible duodenal ulcer with contained perforations.  I recommend acid suppression with PPI, a trial of antibiotics and follow.  If it worsens, we can repeat her CT and consider percutaneous drainage.  The duodenum can also be scoped if necessary via a gastrotomy.  Also recommend checking H. pylori antibodies and completely avoid aspirin, nonsteroidals or smoking which she just quit 2 months ago.  2.  History of gastric bypass with weight regain and poor compliance with supplements.  She resume her multivitamins, calcium and also give her empiric thiamine and B12.  3.  No gallstones on the CT or MRI but I think it is worth getting an ultrasound.  3.  Obesity with a body mass index of 38.  4.  Anemia.  5.  History of  sleep apnea, relieved by weight loss.  6.  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  7.  Stress urinary incontinence.    RECOMMENDATIONS:  As above, will continue to follow her while she is in the hospital with further recommendations pending her course.      ___________________  Margarita Grizzle MD  Dictated By:.   mad  D:04/07/2018 15:44:47  T: 04/07/2018 16:16:47  [id number]

## 2018-04-07 NOTE — Consults (Signed)
Surg Consult    Dictated [phone]    RUQ abd painx3 wks with duodenal thickening on CT and periduodenal fluid on mri- because of her gastric bypass this is not accessible to egd. Possible duodenal ulcer with contained perforation.  Rec PPI, trial of Ab and follow. If worsens repeat CT; duodenum can be scoped via gastrotomy if necessary. Check H pylori Ab. No more asa, nsaids, or smoking (just quit x 2 mos). Adv diet as tol.  Hx gastric bypass 2012, wt re-gain and poor compliance with suppl., resume mvi, calcium citrate;  empiric B1, B12  No gb stones on ct or mri, get ultrasound  Obesity, bmi 38  Anemia  Hx OSA rel by wt loss  WRA- knees  SUI    Blood pressure 119/75, pulse 86, temperature 98.6 ??F (37 ??C), resp. rate 18, height 5' (1.524 m), weight 88.9 kg (196 lb), last menstrual period 04/01/2018, SpO2 95 %, unknown if currently breastfeeding.    CT 1/21- gastric bypass anatomy, duodenal thickening  MRI 1/24- periduodenal fluid  EGD 1/24- normal gastric bypass anatomy    Recent Results (from the past 24 hour(s))   METABOLIC PANEL, COMPREHENSIVE    Collection Time: 04/07/18 12:31 PM   Result Value Ref Range    Sodium 136 136 - 145 mEq/L    Potassium 4.0 3.5 - 5.1 mEq/L    Chloride 105 98 - 107 mEq/L    CO2 26 21 - 32 mEq/L    Glucose 95 74 - 106 mg/dl    BUN 2 (L) 7 - 25 mg/dl    Creatinine 0.5 (L) 0.6 - 1.3 mg/dl    GFR est AA >60.0      GFR est non-AA >60      Calcium 8.4 (L) 8.5 - 10.1 mg/dl    AST (SGOT) 23 15 - 37 U/L    ALT (SGPT) 15 12 - 78 U/L    Alk. phosphatase 135 (H) 45 - 117 U/L    Bilirubin, total 0.3 0.2 - 1.0 mg/dl    Protein, total 6.3 (L) 6.4 - 8.2 gm/dl    Albumin 2.4 (L) 3.4 - 5.0 gm/dl    Anion gap 6 5 - 15 mmol/L   GGT    Collection Time: 04/07/18 12:31 PM   Result Value Ref Range    GGT 94 (H) 5 - 85 U/L

## 2018-04-08 ENCOUNTER — Inpatient Hospital Stay: Admit: 2018-04-08 | Payer: MEDICAID | Primary: Family Medicine

## 2018-04-08 LAB — COMPREHENSIVE METABOLIC PANEL
ALT: 63 U/L (ref 12–78)
AST: 149 U/L — ABNORMAL HIGH (ref 15–37)
Albumin: 2.3 gm/dl — ABNORMAL LOW (ref 3.4–5.0)
Alkaline Phosphatase: 228 U/L — ABNORMAL HIGH (ref 45–117)
Anion Gap: 7 mmol/L (ref 5–15)
BUN: 2 mg/dl — ABNORMAL LOW (ref 7–25)
CO2: 27 mEq/L (ref 21–32)
Calcium: 8.4 mg/dl — ABNORMAL LOW (ref 8.5–10.1)
Chloride: 104 mEq/L (ref 98–107)
Creatinine: 0.7 mg/dl (ref 0.6–1.3)
EGFR IF NonAfrican American: >60
GFR African American: >60
Glucose: 88 mg/dl (ref 74–106)
Potassium: 3.6 mEq/L (ref 3.5–5.1)
Sodium: 138 mEq/L (ref 136–145)
Total Bilirubin: 0.3 mg/dl (ref 0.2–1.0)
Total Protein: 6.8 gm/dl (ref 6.4–8.2)

## 2018-04-08 LAB — METABOLIC PANEL, COMPREHENSIVE
ALT (SGPT): 63 U/L (ref 12–78)
AST (SGOT): 149 U/L — ABNORMAL HIGH (ref 15–37)
Albumin: 2.3 gm/dl — ABNORMAL LOW (ref 3.4–5.0)
Alk. phosphatase: 228 U/L — ABNORMAL HIGH (ref 45–117)
Anion gap: 7 mmol/L (ref 5–15)
BUN: 2 mg/dl — ABNORMAL LOW (ref 7–25)
Bilirubin, total: 0.3 mg/dl (ref 0.2–1.0)
CO2: 27 mEq/L (ref 21–32)
Calcium: 8.4 mg/dl — ABNORMAL LOW (ref 8.5–10.1)
Chloride: 104 mEq/L (ref 98–107)
Creatinine: 0.7 mg/dl (ref 0.6–1.3)
GFR est AA: >60
GFR est non-AA: >60
Glucose: 88 mg/dl (ref 74–106)
Potassium: 3.6 mEq/L (ref 3.5–5.1)
Protein, total: 6.8 gm/dl (ref 6.4–8.2)
Sodium: 138 mEq/L (ref 136–145)

## 2018-04-08 MED ORDER — CIPROFLOXACIN IN D5W 400 MG/200 ML IV PIGGY BACK
400 mg/200 mL | Freq: Two times a day (BID) | INTRAVENOUS | Status: DC
Start: 2018-04-08 — End: 2018-04-11
  Administered 2018-04-08 – 2018-04-11 (×7): via INTRAVENOUS

## 2018-04-08 MED ORDER — ONDANSETRON (PF) 4 MG/2 ML INJECTION
4 mg/2 mL | INTRAMUSCULAR | Status: DC | PRN
Start: 2018-04-08 — End: 2018-04-12
  Administered 2018-04-09 – 2018-04-11 (×3): via INTRAVENOUS

## 2018-04-08 MED ORDER — METRONIDAZOLE IN SODIUM CHLORIDE (ISO-OSM) 500 MG/100 ML IV PIGGY BACK
500 mg/100 mL | Freq: Three times a day (TID) | INTRAVENOUS | Status: DC
Start: 2018-04-08 — End: 2018-04-11
  Administered 2018-04-08 – 2018-04-11 (×9): via INTRAVENOUS

## 2018-04-08 MED ORDER — D5-1/2 NS & POTASSIUM CHLORIDE 20 MEQ/L IV
20 mEq/L | INTRAVENOUS | Status: DC
Start: 2018-04-08 — End: 2018-04-12
  Administered 2018-04-08 – 2018-04-12 (×5): via INTRAVENOUS

## 2018-04-08 MED FILL — DOCUSATE SODIUM 100 MG CAP: 100 mg | ORAL | Qty: 1

## 2018-04-08 MED FILL — ONDANSETRON (PF) 4 MG/2 ML INJECTION: 4 mg/2 mL | INTRAMUSCULAR | Qty: 2

## 2018-04-08 MED FILL — POLYETHYLENE GLYCOL 3350 17 GRAM (100 %) ORAL POWDER PACKET: 17 gram | ORAL | Qty: 1

## 2018-04-08 MED FILL — HYDROMORPHONE 1 MG/ML INJECTION SOLUTION: 1 mg/mL | INTRAMUSCULAR | Qty: 1

## 2018-04-08 MED FILL — FERROUS SULFATE 325 MG (65 MG ELEMENTAL IRON) TAB: 325 mg (65 mg iron) | ORAL | Qty: 1

## 2018-04-08 MED FILL — BD POSIFLUSH NORMAL SALINE 0.9 % INJECTION SYRINGE: INTRAMUSCULAR | Qty: 10

## 2018-04-08 MED FILL — CHILDS/IRON CHEWABLE TABLET: ORAL | Qty: 1

## 2018-04-08 MED FILL — CIPROFLOXACIN IN D5W 400 MG/200 ML IV PIGGY BACK: 400 mg/200 mL | INTRAVENOUS | Qty: 200

## 2018-04-08 MED FILL — BD POSIFLUSH NORMAL SALINE 0.9 % INJECTION SYRINGE: INTRAMUSCULAR | Qty: 100

## 2018-04-08 MED FILL — METRONIDAZOLE IN SODIUM CHLORIDE (ISO-OSM) 500 MG/100 ML IV PIGGY BACK: 500 mg/100 mL | INTRAVENOUS | Qty: 100

## 2018-04-08 MED FILL — PIPERACILLIN-TAZOBACTAM 3.375 GRAM IV SOLR: 3.375 gram | INTRAVENOUS | Qty: 3.38

## 2018-04-08 MED FILL — OXYCODONE 5 MG TAB: 5 mg | ORAL | Qty: 1

## 2018-04-08 MED FILL — PANTOPRAZOLE 40 MG IV SOLR: 40 mg | INTRAVENOUS | Qty: 40

## 2018-04-08 MED FILL — THIAMINE 100 MG/ML INJECTION: 100 mg/mL | INTRAMUSCULAR | Qty: 2

## 2018-04-08 NOTE — Progress Notes (Signed)
INTERNAL MEDICINE PROGRESS NOTE  Patient: Ashley [name]   Date of Birth: 1985-05-12   MRN: [id number]        Assessment / The Hospital course:     Principle Problems:    Abdominal fluid collection: suspect abscess near the duodenum  - I d/w general surgery and GI.  -Both agreed to treat medically as very difficult to get to surgically.  - she is improving.  - switch zosyn to cipro/flagyl.       RUQ , Epigastric pain radiating to the back, intermittent??  Gastritis and duodenitis, Possible anastomotic ulcer or penetrating PUD    Indeterminate lesion within the left medial hepatic lobe - 2.8 cm   - presented with RUQ , Epigastric pain radiating to the back, intermittent , associated with N/V , lipase normal  - h/o Roux-en-Y gastric bypass  - she diagnosed with pancreatitis few days ago, Lipase normal here was 446 on 1/19, pancrease normal on CT   - on admission afebrile, vitals stable, blood workup is unremarkable, CT abd showed Duodenitis   - started on PPI , GI consult, Plan for EGD and MRCP     Chronic NCNC anemia - IDA,  iron panel showed iron deficiency, Stable, Po iron added   ??  Constipation: opioid induced.  - No Bm since she has been here according to her.     Code Status: Full code   DVT prophylaxis: pharmacologic and mechanical  Disposition :??home    Today Plan:   Continue with PPI   GI plan for EGD, enteroscopy and MRCP   Add PO iron   Continue with PT/ambulation     Start laxatives,stool softeners.    Subjective:   She denies any n/v but still complaining of epigastric as well as RUQ pain.  Pain on palpation of RUQ with radiation into the back.     She states that she has not had a bm since she has been here.    Medical Decision Making   Chart, Images and Lab data reviewed, necessary medical Orders placed   Discussed with nursing staff and Case Manager. .     Vitals:    04/08/18 1605 04/08/18 1935 04/09/18 0411 04/09/18 0831   BP: 117/75 125/67 90/62 115/70   Pulse: 70 83 86 79   Resp: 18 18 20 16     Temp: 98.1 ??F (36.7 ??C) 98.2 ??F (36.8 ??C) 98.2 ??F (36.8 ??C) 98.4 ??F (36.9 ??C)   SpO2: 99% 96% 100% 100%   Weight:       Height:         Temp (24hrs), Avg:98.3 ??F (36.8 ??C), Min:98.1 ??F (36.7 ??C), Max:98.4 ??F (36.9 ??C)      Intake/Output Summary (Last 24 hours) at 04/09/2018 1008  Last data filed at 04/09/2018 0639  Gross per 24 hour   Intake 1370 ml   Output ???   Net 1370 ml       Physical Exam:   General Appearance:   Appears in no acute distress.,   Skin:   Skin warm & dry, No rash, No jaundice,   Lymph:  There is no lymphadenopathy,   HEENT:   PERRLA, EOMI, Moist oral mucous membranes, conjunctiva clear,   Neck:   Supple, Without masses,   Lungs:   Clear, No wheezes., No rales., Normal respiratory effort,   Heart:  Regular rate and rhythm, No gallop,   Abdomen:   Soft , Non-distended, Normal bowel sounds and Non-tender,   Extremities:  No edema of legs, Normal pedal and radial pulses,   Neuro:   alert, oriented, affect appropriate, speech fluent, cranial nerves intact, no focal neurological deficits and moves all extremities well    Current medications:     Current Facility-Administered Medications   Medication Dose Route Frequency   ??? dextrose 5% - 0.45% NaCl with KCl 20 mEq/L infusion  75 mL/hr IntraVENous CONTINUOUS   ??? ciprofloxacin (CIPRO) 400 mg in D5W IVPB (premix)  400 mg IntraVENous Q12H   ??? metroNIDAZOLE (FLAGYL) IVPB premix 500 mg  500 mg IntraVENous Q8H   ??? ondansetron (ZOFRAN) injection 4 mg  4 mg IntraVENous Q4H PRN   ??? oxyCODONE IR (ROXICODONE) tablet 5 mg  5 mg Oral Q4H PRN   ??? docusate sodium (COLACE) capsule 100 mg  100 mg Oral BID   ??? polyethylene glycol (MIRALAX) packet 17 g  17 g Oral BID   ??? bisacodyL (DULCOLAX) suppository 10 mg  10 mg Rectal DAILY PRN   ??? naloxone (NARCAN) injection 0.4 mg  0.4 mg IntraVENous EVERY 2 MINUTES AS NEEDED   ??? pantoprazole (PROTONIX) 40 mg in 0.9% sodium chloride 10 mL injection  40 mg IntraVENous Q24H   ??? thiamine (B-1) injection 100 mg  100 mg IntraMUSCular  DAILY   ??? multivitamin with iron (FLINTSTONES) chewable tablet 1 Tab  1 Tab Oral BID   ??? ferrous sulfate tablet 325 mg  1 Tab Oral DAILY WITH BREAKFAST   ??? dicyclomine (BENTYL) capsule 10 mg  10 mg Oral Q6H PRN   ??? acetaminophen (TYLENOL) tablet 650 mg  650 mg Oral Q6H PRN   ??? sodium chloride (NS) flush 5-10 mL  5-10 mL IntraVENous Q8H   ??? sodium chloride (NS) flush 5-10 mL  5-10 mL IntraVENous PRN   ??? naloxone (NARCAN) injection 0.1 mg  0.1 mg IntraVENous PRN   ??? ondansetron (ZOFRAN) injection 4 mg  4 mg IntraVENous Q6H PRN   ??? HYDROmorphone (DILAUDID) injection 0.5 mg  0.5 mg IntraVENous Q4H PRN          Laboratory and Radiology Data :     No results found for this or any previous visit (from the past 24 hour(s)).    XR Results:  Results from Hospital Encounter encounter on 01/20/18   XR KNEE RT MIN 4 V    Narrative Indication: 79 without injury    4 views right knee show no bony or soft tissue abnormality.      Impression IMPRESSION: Normal study.         CT Results:  Results from Hospital Encounter encounter on 04/03/18   CT ABD PELV W CONT    Narrative CT of the abdomen and pelvis     INDICATION:  RUQ pain.      COMPARISON: No relevant studies.    TECHNIQUE: CT of the abdomen and pelvis was performed with nonionic intravenous  contrast with coronal and sagittal reformatted images.    DICOM format image data is available to non-affiliated external healthcare  facilities or entities on a secure, media free, reciprocally searchable basis  with patient authorization for 12 months following the date of the study.    FINDINGS:    LOWER THORAX: There are bilateral lower lobe infiltrates, left greater than  right. Additional areas of subsegmental atelectasis.    ABDOMEN: There is no free air or free fluid. There is a slightly ill-defined 2.8  cm hypoenhancing lesion within the left medial hepatic lobe adjacent to the  falciform ligament for which MRI of the abdomen with and without contrast is  suggested. There is  fatty infiltration of the liver. There are no radiopaque  gallstones. The adrenal glands are unremarkable. There is no hydronephrosis or  nephrolithiasis.    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 There is thickening of  the horizontal portion of the duodenum with surrounding free fluid and  inflammatory change. This is inferior to the uncinate process of the pancreas  and inflammation likely secondary to duodenal inflammation. Lipase normal on  chart review. No definite free air. No dilated loops of bowel.         PELVIS: There is no focal bladder wall thickening. The uterus is present.  Negative for aortic dissection.    OSSEOUS STRUCTURES: Acutely intact.      Impression IMPRESSION:    1.  Thickening of the horizontal portion of duodenum with surrounding  inflammatory changes. Findings likely reflect duodenitis. Superimposed ulcer not  excluded. While inflammation is near the pancreas, lipase is normal in chart  review.  2.  Fatty infiltration of the liver. 2.8 cm indeterminate lesion within the left  medial hepatic lobe. MRI with and without contrast recommended.  3.  Bibasilar infiltrates/atelectasis.         MRI Results:  Results from Hospital Encounter encounter on 04/03/18   MRI ABD W WO CONT    Narrative MRI ABDOMEN WITH AND WITHOUT CONTRAST    CLINICAL HISTORY: Right upper quadrant pain    COMPARISON: CT dated 04/03/2018    TECHNIQUE: Multiplanar, multisequence imaging of the upper abdomen was performed  before and after administration of intravenous contrast.    FINDINGS:    Subsegmental atelectasis left greater than right lung base.    2.7 cm focal lesion within the left hepatic lobe, adjacent to the falciform  ligament is consistent with focal hepatic steatosis. This region manifested as  signal dropout on the out of phase sequence. Liver, otherwise grossly  unremarkable. Gallbladder, spleen, adrenal glands and kidneys grossly  unremarkable. Gastric bypass surgery. Inflammatory change  adjacent to the  pancreatic head and uncinate process. There is also thickening of the distal  second and proximal third portions of the duodenum. Adjacent to the junction of  the second and third portions of the duodenum, there is a peripherally enhancing  8.5 x 4.6 x 2.8 cm (ML by CC by AP) fluid collection. The fluid collection is  intimate with the inferior margin of the duodenum within this region, and  extends to the right anterior perirenal fascia. This is concerning for a  developing abscess. This may be related to a contained, ruptured ulcer. Other  differential considerations may include pancreatic pseudocyst with possible  superimposed inflammation of the pancreatic head/uncinate process.      Impression IMPRESSION:  1. Lesion within the liver visualized on previous CT is most consistent with  focal fatty infiltration. No aggressive process.  2. Prominent peripherally enhancing fluid collection adjacent to the pancreatic  head/uncinate process with extensive surrounding inflammatory change as  described. This is concerning for a developing abscess. This may be related to a  contained, ruptured ulcer. Other differential considerations may include  pancreatic pseudocyst with possible superimposed inflammation of the pancreatic  head/uncinate process.  3. Subsegmental atelectasis within the lung bases.         Nuclear Medicine Results:  No results found for this or any previous visit.    [place] Results:  Results from [facility] Encounter encounter on  04/03/18   [place] ABD LTD    Narrative Indication: Right upper quadrant pain    Sonographic evaluation right upper quadrant shows diffusely echogenic liver.  Adjacent to the left hepatic lobe and superior to the pancreas is a fluid  collection measuring 2.4 x 1.5 x 3.0 cm which appears uncomplicated. A mobile  stone is seen in the gallbladder  without associated focal tenderness or wall  thickening. Common bile duct 2 mm. Pancreas, aorta, IVC, right kidney normal.       Impression IMPRESSION: Subhepatic fluid collection which may correspond to lesions seen on  prior MRI of 04/06/2018. Cholelithiasis without ultrasound criteria for acute  cholecystitis. Fatty liver.         IR Results:  No results found for this or any previous visit.    VAS/US Results:  Results from Hospital Encounter encounter on 01/20/18   DUPLEX LOWER EXT VENOUS RIGHT    Narrative                                                               Study ID:   [id number]                                                 [facility]                                            [address] [place],                                          [place]  [id number]                                   Lower Extremity Venous Report    Name: Melina CopaOWENS, Kolby L           Study Date: 01/20/2018 08:00 PM  MRN: [id number]                      Patient Location: [id number]  DOB: Apr 30, 1985                  Age: [age] yrs  Gender: Female                   Account #: [id number]  Reason For Study: Right leg pain  Ordering Physician: Thea SilversmithGREGORY, SARAH    Performed By: Francee PiccoloBlanchard, Donna, RVS    Interpretation Summary  No evidence of superficial or deep vein thrombosis noted in the right lower  extremity.  No evidence of deep venous thrombosis in the contralateral/left common   femoral  vein.  _____________________________________________________________________________  __      QUALITY/PROCEDURE  Limited unilateral venous duplex performed of the right leg. Quality of the  study is good. M79.604.    HISTORY/SYMPTOMS  Right leg pain.    RIGHT LEG  The common femoral, femoral, popliteal, posterior tibial and peroneal veins  were examined with duplex ultrasound. The deep veins were patent  and  compressible with no evidence of intraluminal thrombus. Spontaneous, phasic  venous flow with normal augmentation noted throughout the right leg.    RIGHT SAPHENOUS VEINS  Spectral Doppler exam demonstrates normal venous flow in the right great  saphenous vein. Compression of the right great saphenous vein is complete.  Filling defects in the right great saphenous vein are absent.      LEFT LEG  Spectral Doppler exam demonstrates normal venous flow in the left common  femoral vein. Compression of the left common femoral vein is complete.   Filling  defects in the left common femoral vein are absent.      Electronically signed byDR Billie RuddyFrederick Southern, MD   01/21/2018 01:10 AM       Admission HPI:           Marnee GuarneriMustafa [place] M.D.  [facility]  Page [phone]

## 2018-04-08 NOTE — Progress Notes (Signed)
 Problem: Falls - Risk of  Goal: *Absence of Falls  Description: Document Ashley [name] Fall Risk and appropriate interventions in the flowsheet.  Outcome: Progressing Towards Goal  Note: Fall Risk Interventions:  Mobility Interventions: Bed/chair exit alarm, Patient to call before getting OOB         Medication Interventions: Bed/chair exit alarm, Patient to call before getting OOB, Teach patient to arise slowly                   Problem: Patient Education: Go to Patient Education Activity  Goal: Patient/Family Education  Outcome: Progressing Towards Goal     Problem: Pain  Goal: *Control of Pain  Outcome: Progressing Towards Goal     Problem: Patient Education: Go to Patient Education Activity  Goal: Patient/Family Education  Outcome: Progressing Towards Goal

## 2018-04-08 NOTE — Progress Notes (Signed)
Problem: Falls - Risk of  Goal: *Absence of Falls  Description  Document Schmid Fall Risk and appropriate interventions in the flowsheet.  Outcome: Progressing Towards Goal  Note: Fall Risk Interventions:  Mobility Interventions: Patient to call before getting OOB         Medication Interventions: Assess postural VS orthostatic hypotension

## 2018-04-08 NOTE — Progress Notes (Signed)
Progress Notes by Robby Sermon, MD at 04/08/18 1631                Author: Robby Sermon, MD  Service: Gastroenterology  Author Type: Physician       Filed: 04/08/18 1638  Date of Service: 04/08/18 1631  Status: Signed          Editor: Robby Sermon, MD (Physician)                       Gastrointestinal Progress Note      Patient Name: Ashley [name]      Today's Date: 04/08/2018      Admit Date: 04/03/2018           Assessment:     1.  8.5 cm fluid collection versus abscess adjacent to the 3rd and 4th portions of the duodenum seen on MRI probably a sealed-off duodenal ulcer perforation  mimicking acute pancreatitis.  No MR evidence of gallstones or biliary dilatation.  RUQ [place] today showed gallstone and 2.5 cm subhepatic/peripancreatic fluid collection similar to what was seen on MRI.   2.  Hx of RNYGB surgery.  EGD with enteroscopy yesterday showing normal mucosa but unable to reach J-J anastomosis.   3.  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        Recommendation:     1.  Continue diabetic diet as ordered by Dr. Christell Constant.   2.  Anticipate surveillance CT abdomen in 2-3 days.   3.  If she begins to have signs of infection such as fever, increasing pain or increasing wbc count then she will need to undergo urgent IR CT guided drainage.   4.  Continue Zosyn and PPI IV for now.        Subjective:     She is still c/o RUQ pain after meals.  No fever, chills, nausea or vomiting.   WBC count stable.  Receiving Zosyn IV.        Current Facility-Administered Medications          Medication  Dose  Route  Frequency           ?  dextrose 5% - 0.45% NaCl with KCl 20 mEq/L infusion   75 mL/hr  IntraVENous  CONTINUOUS     ?  ciprofloxacin (CIPRO) 400 mg in D5W IVPB (premix)   400 mg  IntraVENous  Q12H           ?  metroNIDAZOLE (FLAGYL) IVPB premix 500 mg   500 mg  IntraVENous  Q8H           ?  ondansetron (ZOFRAN) injection 4 mg   4 mg  IntraVENous  Q4H PRN     ?  oxyCODONE IR (ROXICODONE)  tablet 5 mg   5 mg  Oral  Q4H PRN     ?  docusate sodium (COLACE) capsule 100 mg   100 mg  Oral  BID     ?  polyethylene glycol (MIRALAX) packet 17 g   17 g  Oral  BID     ?  bisacodyL (DULCOLAX) suppository 10 mg   10 mg  Rectal  DAILY PRN     ?  naloxone (NARCAN) injection 0.4 mg   0.4 mg  IntraVENous  EVERY 2 MINUTES AS NEEDED     ?  pantoprazole (PROTONIX) 40 mg in 0.9% sodium chloride 10 mL injection   40 mg  IntraVENous  Q24H     ?  thiamine (B-1) injection 100 mg   100 mg  IntraMUSCular  DAILY     ?  multivitamin with iron (FLINTSTONES) chewable tablet 1 Tab   1 Tab  Oral  BID     ?  ferrous sulfate tablet 325 mg   1 Tab  Oral  DAILY WITH BREAKFAST     ?  dicyclomine (BENTYL) capsule 10 mg   10 mg  Oral  Q6H PRN     ?  acetaminophen (TYLENOL) tablet 650 mg   650 mg  Oral  Q6H PRN     ?  sodium chloride (NS) flush 5-10 mL   5-10 mL  IntraVENous  Q8H     ?  sodium chloride (NS) flush 5-10 mL   5-10 mL  IntraVENous  PRN     ?  naloxone (NARCAN) injection 0.1 mg   0.1 mg  IntraVENous  PRN     ?  ondansetron (ZOFRAN) injection 4 mg   4 mg  IntraVENous  Q6H PRN           ?  HYDROmorphone (DILAUDID) injection 0.5 mg   0.5 mg  IntraVENous  Q4H PRN                 Objective:        Visit Vitals      BP  117/75 (BP 1 Location: Right arm, BP Patient Position: Supine)     Pulse  70     Temp  98.1 ??F (36.7 ??C)     Resp  18     Ht  5' (1.524 m)     Wt  88.9 kg (196 lb)     LMP  04/01/2018 (Exact Date)     SpO2  99%        BMI  38.28 kg/m??                 Intake/Output Summary (Last 24 hours) at 04/08/2018 1632   Last data filed at 04/08/2018 0600     Gross per 24 hour        Intake  2000 ml        Output  0 ml        Net  2000 ml           Examination:   Awake, alert, oriented.   Abdomen with RUQ pain and tenderness.  No guarding or rebound.  No mass or ascites.   Warm extremities,  2+ pulses, no edema.      Data Review:         Labs:  Results:        Chemistry  Recent Labs         04/08/18   0309  04/07/18   1231   04/06/18   0447      GLU  88  95  78      NA  138  136  138      K  3.6  4.0  3.5      CL  104  105  105      CO2  27  26  26       BUN  2*  2*  3*      CREA  0.7  0.5*  0.6      CA  8.4*  8.4*  8.7      AGAP  7  6  7  AP  228*  135*  129*      TP  6.8  6.3*  6.5      ALB  2.3*  2.4*  2.2*          Estimated Creatinine Clearance: 113.5 mL/min (based on SCr of 0.7 mg/dL).        CBC w/Diff  Recent Labs         04/06/18   0447      WBC  6.2      RBC  3.00*      HGB  9.3*      HCT  28.8*      PLT  284      GRANS  60.1      LYMPH  24.8*      EOS  1.0                 Coagulation  No results for input(s): PTP, INR, APTT, INREXT in the last 72 hours.         Hepatitis Panel  Lab Results      Component  Value  Date/Time        Hep B surface Ag screen  Negative  12/20/2010 12:00 AM                 Amylase Lipase  .        Liver Enzymes  Recent Labs         04/08/18   0309      TP  6.8      ALB  2.3*      AP  228*      SGOT  149*      ALT  63                 Thyroid Studies  No results for input(s): T4, T3U, TSH, TSHEXT in the last 72 hours.      No lab exists for component: T3RU          Pathology  pathology           Sierah Lacewell P. Leonard Downingiongco, MD, FACP   Pager: [phone]   April 08, 2018

## 2018-04-08 NOTE — Progress Notes (Signed)
Bedside and Verbal shift change report given to Lilian K (RN) (oncoming nurse) by Crystal G (RN) (offgoing nurse). Report included the following information SBAR, Kardex, Intake/Output and MAR.

## 2018-04-08 NOTE — Progress Notes (Signed)
Surg HD6, zosyn 2    RUQ abd pain with poss contained du perf- pain a little better, cont zosyn, diet as tol    Dull RUQ pain but better  Getting appetite back    Blood pressure 116/68, pulse 90, temperature 98.8 ??F (37.1 ??C), resp. rate 18, height 5' (1.524 m), weight 88.9 kg (196 lb), last menstrual period 04/01/2018, SpO2 98 %, unknown if currently breastfeeding.    Chest clear  Heart rrr  abd soft, min ruq tenderness, no guarding    Ultrasound gb pending    Recent Results (from the past 12 hour(s))   METABOLIC PANEL, COMPREHENSIVE    Collection Time: 04/08/18  3:09 AM   Result Value Ref Range    Sodium 138 136 - 145 mEq/L    Potassium 3.6 3.5 - 5.1 mEq/L    Chloride 104 98 - 107 mEq/L    CO2 27 21 - 32 mEq/L    Glucose 88 74 - 106 mg/dl    BUN 2 (L) 7 - 25 mg/dl    Creatinine 0.7 0.6 - 1.3 mg/dl    GFR est AA >60.0      GFR est non-AA >60      Calcium 8.4 (L) 8.5 - 10.1 mg/dl    AST (SGOT) 149 (H) 15 - 37 U/L    ALT (SGPT) 63 12 - 78 U/L    Alk. phosphatase 228 (H) 45 - 117 U/L    Bilirubin, total 0.3 0.2 - 1.0 mg/dl    Protein, total 6.8 6.4 - 8.2 gm/dl    Albumin 2.3 (L) 3.4 - 5.0 gm/dl    Anion gap 7 5 - 15 mmol/L

## 2018-04-08 NOTE — Progress Notes (Signed)
Surg HD6, zosyn 2    RUQ abd pain with poss contained du perf- pain a little better, cont zosyn, diet as tol    Dull RUQ pain but better  Getting appetite back    Blood pressure 116/68, pulse 90, temperature 98.8 ??F (37.1 ??C), resp. rate 18, height 5' (1.524 m), weight 88.9 kg (196 lb), last menstrual period 04/01/2018, SpO2 98 %, unknown if currently breastfeeding.    Chest clear  Heart rrr  abd soft, min ruq tenderness, no guarding    Ultrasound gb pending    Recent Results (from the past 12 hour(s))   METABOLIC PANEL, COMPREHENSIVE    Collection Time: 04/08/18  3:09 AM   Result Value Ref Range    Sodium 138 136 - 145 mEq/L    Potassium 3.6 3.5 - 5.1 mEq/L    Chloride 104 98 - 107 mEq/L    CO2 27 21 - 32 mEq/L    Glucose 88 74 - 106 mg/dl    BUN 2 (L) 7 - 25 mg/dl    Creatinine 0.7 0.6 - 1.3 mg/dl    GFR est AA >60.0      GFR est non-AA >60      Calcium 8.4 (L) 8.5 - 10.1 mg/dl    AST (SGOT) 149 (H) 15 - 37 U/L    ALT (SGPT) 63 12 - 78 U/L    Alk. phosphatase 228 (H) 45 - 117 U/L    Bilirubin, total 0.3 0.2 - 1.0 mg/dl    Protein, total 6.8 6.4 - 8.2 gm/dl    Albumin 2.3 (L) 3.4 - 5.0 gm/dl    Anion gap 7 5 - 15 mmol/L

## 2018-04-08 NOTE — Progress Notes (Addendum)
INTERNAL MEDICINE PROGRESS NOTE  Patient: Ashley [name]   Date of Birth: 1985-05-12   MRN: [id number]        Assessment / The Hospital course:     Principle Problems:    Abdominal fluid collection: suspect abscess near the duodenum  - I d/w general surgery and GI.  -Both agreed to treat medically as very difficult to get to surgically.  - she is improving.  - switch zosyn to cipro/flagyl.       RUQ , Epigastric pain radiating to the back, intermittent??  Gastritis and duodenitis, Possible anastomotic ulcer or penetrating PUD    Indeterminate lesion within the left medial hepatic lobe - 2.8 cm   - presented with RUQ , Epigastric pain radiating to the back, intermittent , associated with N/V , lipase normal  - h/o Roux-en-Y gastric bypass  - she diagnosed with pancreatitis few days ago, Lipase normal here was 446 on 1/19, pancrease normal on CT   - on admission afebrile, vitals stable, blood workup is unremarkable, CT abd showed Duodenitis   - started on PPI , GI consult, Plan for EGD and MRCP     Chronic NCNC anemia - IDA,  iron panel showed iron deficiency, Stable, Po iron added   ??  Constipation: opioid induced.  - No Bm since she has been here according to her.     Code Status: Full code   DVT prophylaxis: pharmacologic and mechanical  Disposition :??home    Today Plan:   Continue with PPI   GI plan for EGD, enteroscopy and MRCP   Add PO iron   Continue with PT/ambulation     Start laxatives,stool softeners.    Subjective:   She denies any n/v but still complaining of epigastric as well as RUQ pain.  Pain on palpation of RUQ with radiation into the back.     She states that she has not had a bm since she has been here.    Medical Decision Making   Chart, Images and Lab data reviewed, necessary medical Orders placed   Discussed with nursing staff and Case Manager. .     Vitals:    04/08/18 1605 04/08/18 1935 04/09/18 0411 04/09/18 0831   BP: 117/75 125/67 90/62 115/70   Pulse: 70 83 86 79   Resp: 18 18 20 16     Temp: 98.1 ??F (36.7 ??C) 98.2 ??F (36.8 ??C) 98.2 ??F (36.8 ??C) 98.4 ??F (36.9 ??C)   SpO2: 99% 96% 100% 100%   Weight:       Height:         Temp (24hrs), Avg:98.3 ??F (36.8 ??C), Min:98.1 ??F (36.7 ??C), Max:98.4 ??F (36.9 ??C)      Intake/Output Summary (Last 24 hours) at 04/09/2018 1008  Last data filed at 04/09/2018 0639  Gross per 24 hour   Intake 1370 ml   Output ???   Net 1370 ml       Physical Exam:   General Appearance:   Appears in no acute distress.,   Skin:   Skin warm & dry, No rash, No jaundice,   Lymph:  There is no lymphadenopathy,   HEENT:   PERRLA, EOMI, Moist oral mucous membranes, conjunctiva clear,   Neck:   Supple, Without masses,   Lungs:   Clear, No wheezes., No rales., Normal respiratory effort,   Heart:  Regular rate and rhythm, No gallop,   Abdomen:   Soft , Non-distended, Normal bowel sounds and Non-tender,   Extremities:  No edema of legs, Normal pedal and radial pulses,   Neuro:   alert, oriented, affect appropriate, speech fluent, cranial nerves intact, no focal neurological deficits and moves all extremities well    Current medications:     Current Facility-Administered Medications   Medication Dose Route Frequency   ??? dextrose 5% - 0.45% NaCl with KCl 20 mEq/L infusion  75 mL/hr IntraVENous CONTINUOUS   ??? ciprofloxacin (CIPRO) 400 mg in D5W IVPB (premix)  400 mg IntraVENous Q12H   ??? metroNIDAZOLE (FLAGYL) IVPB premix 500 mg  500 mg IntraVENous Q8H   ??? ondansetron (ZOFRAN) injection 4 mg  4 mg IntraVENous Q4H PRN   ??? oxyCODONE IR (ROXICODONE) tablet 5 mg  5 mg Oral Q4H PRN   ??? docusate sodium (COLACE) capsule 100 mg  100 mg Oral BID   ??? polyethylene glycol (MIRALAX) packet 17 g  17 g Oral BID   ??? bisacodyL (DULCOLAX) suppository 10 mg  10 mg Rectal DAILY PRN   ??? naloxone (NARCAN) injection 0.4 mg  0.4 mg IntraVENous EVERY 2 MINUTES AS NEEDED   ??? pantoprazole (PROTONIX) 40 mg in 0.9% sodium chloride 10 mL injection  40 mg IntraVENous Q24H    ??? thiamine (B-1) injection 100 mg  100 mg IntraMUSCular DAILY   ??? multivitamin with iron (FLINTSTONES) chewable tablet 1 Tab  1 Tab Oral BID   ??? ferrous sulfate tablet 325 mg  1 Tab Oral DAILY WITH BREAKFAST   ??? dicyclomine (BENTYL) capsule 10 mg  10 mg Oral Q6H PRN   ??? acetaminophen (TYLENOL) tablet 650 mg  650 mg Oral Q6H PRN   ??? sodium chloride (NS) flush 5-10 mL  5-10 mL IntraVENous Q8H   ??? sodium chloride (NS) flush 5-10 mL  5-10 mL IntraVENous PRN   ??? naloxone (NARCAN) injection 0.1 mg  0.1 mg IntraVENous PRN   ??? ondansetron (ZOFRAN) injection 4 mg  4 mg IntraVENous Q6H PRN   ??? HYDROmorphone (DILAUDID) injection 0.5 mg  0.5 mg IntraVENous Q4H PRN          Laboratory and Radiology Data :     No results found for this or any previous visit (from the past 24 hour(s)).    XR Results:  Results from Hospital Encounter encounter on 01/20/18   XR KNEE RT MIN 4 V    Narrative Indication: 79 without injury    4 views right knee show no bony or soft tissue abnormality.      Impression IMPRESSION: Normal study.         CT Results:  Results from Hospital Encounter encounter on 04/03/18   CT ABD PELV W CONT    Narrative CT of the abdomen and pelvis     INDICATION:  RUQ pain.      COMPARISON: No relevant studies.    TECHNIQUE: CT of the abdomen and pelvis was performed with nonionic intravenous  contrast with coronal and sagittal reformatted images.    DICOM format image data is available to non-affiliated external healthcare  facilities or entities on a secure, media free, reciprocally searchable basis  with patient authorization for 12 months following the date of the study.    FINDINGS:    LOWER THORAX: There are bilateral lower lobe infiltrates, left greater than  right. Additional areas of subsegmental atelectasis.    ABDOMEN: There is no free air or free fluid. There is a slightly ill-defined 2.8  cm hypoenhancing lesion within the left medial hepatic lobe adjacent to the  falciform ligament for which MRI of the abdomen with and without contrast is  suggested. There is fatty infiltration of the liver. There are no radiopaque  gallstones. The adrenal glands are unremarkable. There is no hydronephrosis or  nephrolithiasis.    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 There is thickening of  the horizontal portion of the duodenum with surrounding free fluid and  inflammatory change. This is inferior to the uncinate process of the pancreas  and inflammation likely secondary to duodenal inflammation. Lipase normal on  chart review. No definite free air. No dilated loops of bowel.         PELVIS: There is no focal bladder wall thickening. The uterus is present.  Negative for aortic dissection.    OSSEOUS STRUCTURES: Acutely intact.      Impression IMPRESSION:    1.  Thickening of the horizontal portion of duodenum with surrounding  inflammatory changes. Findings likely reflect duodenitis. Superimposed ulcer not  excluded. While inflammation is near the pancreas, lipase is normal in chart  review.  2.  Fatty infiltration of the liver. 2.8 cm indeterminate lesion within the left  medial hepatic lobe. MRI with and without contrast recommended.  3.  Bibasilar infiltrates/atelectasis.         MRI Results:  Results from Hospital Encounter encounter on 04/03/18   MRI ABD W WO CONT    Narrative MRI ABDOMEN WITH AND WITHOUT CONTRAST    CLINICAL HISTORY: Right upper quadrant pain    COMPARISON: CT dated 04/03/2018    TECHNIQUE: Multiplanar, multisequence imaging of the upper abdomen was performed  before and after administration of intravenous contrast.    FINDINGS:    Subsegmental atelectasis left greater than right lung base.    2.7 cm focal lesion within the left hepatic lobe, adjacent to the falciform  ligament is consistent with focal hepatic steatosis. This region manifested as  signal dropout on the out of phase sequence. Liver, otherwise grossly   unremarkable. Gallbladder, spleen, adrenal glands and kidneys grossly  unremarkable. Gastric bypass surgery. Inflammatory change adjacent to the  pancreatic head and uncinate process. There is also thickening of the distal  second and proximal third portions of the duodenum. Adjacent to the junction of  the second and third portions of the duodenum, there is a peripherally enhancing  8.5 x 4.6 x 2.8 cm (ML by CC by AP) fluid collection. The fluid collection is  intimate with the inferior margin of the duodenum within this region, and  extends to the right anterior perirenal fascia. This is concerning for a  developing abscess. This may be related to a contained, ruptured ulcer. Other  differential considerations may include pancreatic pseudocyst with possible  superimposed inflammation of the pancreatic head/uncinate process.      Impression IMPRESSION:  1. Lesion within the liver visualized on previous CT is most consistent with  focal fatty infiltration. No aggressive process.  2. Prominent peripherally enhancing fluid collection adjacent to the pancreatic  head/uncinate process with extensive surrounding inflammatory change as  described. This is concerning for a developing abscess. This may be related to a  contained, ruptured ulcer. Other differential considerations may include  pancreatic pseudocyst with possible superimposed inflammation of the pancreatic  head/uncinate process.  3. Subsegmental atelectasis within the lung bases.         Nuclear Medicine Results:  No results found for this or any previous visit.    [place] Results:  Results from [facility] Encounter encounter on 04/03/18  US ABD LTD    Narrative Indication: Right upper quadrant pain    Sonographic evaluation right upper quadrant shows diffusely echogenic liver.  Adjacent to the left hepatic lobe and superior to the pancreas is a fluid  collection measuring 2.4 x 1.5 x 3.0 cm which appears uncomplicated. A mobile   stone is seen in the gallbladder  without associated focal tenderness or wall  thickening. Common bile duct 2 mm. Pancreas, aorta, IVC, right kidney normal.      Impression IMPRESSION: Subhepatic fluid collection which may correspond to lesions seen on  prior MRI of 04/06/2018. Cholelithiasis without ultrasound criteria for acute  cholecystitis. Fatty liver.         IR Results:  No results found for this or any previous visit.    VAS/US Results:  Results from Hospital Encounter encounter on 01/20/18   DUPLEX LOWER EXT VENOUS RIGHT    Narrative                                                               Study ID:   [id number]                                                 [facility]                                          [facility]                                          [facility]                                            [address]                                        [address]                                        [place],                                          [place]  [address]                                   Lower Extremity Venous Report    Name: VANELY, SHARBER Date: 01/20/2018 08:00 PM  MRN: [id number]                      Patient Location: [id number]  DOB: 09-Aug-1985                  Age: 46 yrs  Gender: Female                   Account #: [id number]  Reason For Study: Right leg pain  Ordering Physician: Thea Silversmith    Performed By: Francee Piccolo, RVS    Interpretation Summary  No evidence of superficial or deep vein thrombosis noted in the right lower  extremity.  No evidence of deep venous thrombosis in the contralateral/left common   femoral  vein.  _____________________________________________________________________________  __      QUALITY/PROCEDURE  Limited unilateral venous duplex performed of the right leg. Quality of the  study is good. M79.604.    HISTORY/SYMPTOMS   Right leg pain.    RIGHT LEG  The common femoral, femoral, popliteal, posterior tibial and peroneal veins  were examined with duplex ultrasound. The deep veins were patent and  compressible with no evidence of intraluminal thrombus. Spontaneous, phasic  venous flow with normal augmentation noted throughout the right leg.    RIGHT SAPHENOUS VEINS  Spectral Doppler exam demonstrates normal venous flow in the right great  saphenous vein. Compression of the right great saphenous vein is complete.  Filling defects in the right great saphenous vein are absent.      LEFT LEG  Spectral Doppler exam demonstrates normal venous flow in the left common  femoral vein. Compression of the left common femoral vein is complete.   Filling  defects in the left common femoral vein are absent.      Electronically signed byDR Billie Ruddy, MD   01/21/2018 01:10 AM       Admission HPI:           Marnee Guarneri M.D.  [facility]  Page [phone]

## 2018-04-08 NOTE — Other (Signed)
BEDSIDE shift change report given to Crystal Greene, RN () by Keosha B, RN. Report included the following information SBAR REPORTS and Kardex.

## 2018-04-08 NOTE — Progress Notes (Signed)
Problem: Falls - Risk of  Goal: *Absence of Falls  Description  Document Schmid Fall Risk and appropriate interventions in the flowsheet.  Outcome: Progressing Towards Goal  Note: Fall Risk Interventions:  Mobility Interventions: Bed/chair exit alarm, Patient to call before getting OOB         Medication Interventions: Bed/chair exit alarm, Patient to call before getting OOB, Teach patient to arise slowly                   Problem: Patient Education: Go to Patient Education Activity  Goal: Patient/Family Education  Outcome: Progressing Towards Goal     Problem: Pain  Goal: *Control of Pain  Outcome: Progressing Towards Goal     Problem: Patient Education: Go to Patient Education Activity  Goal: Patient/Family Education  Outcome: Progressing Towards Goal

## 2018-04-08 NOTE — Progress Notes (Signed)
Gastrointestinal Progress Note    Patient Name: Ashley [name]    IOEVO'J Date: 04/08/2018    Admit Date: 04/03/2018      Assessment:   1.  8.5 cm fluid collection versus abscess adjacent to the 3rd and 4th portions of the duodenum seen on MRI probably a sealed-off duodenal ulcer perforation mimicking acute pancreatitis.  No MR evidence of gallstones or biliary dilatation.  RUQ [place] today showed gallstone and 2.5 cm subhepatic/peripancreatic fluid collection similar to what was seen on MRI.  2.  Hx of RNYGB surgery.  EGD with enteroscopy yesterday showing normal mucosa but unable to reach J-J anastomosis.  3.  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    Recommendation:   1.  Continue diabetic diet as ordered by Dr. Christell Constant.  2.  Anticipate surveillance CT abdomen in 2-3 days.  3.  If she begins to have signs of infection such as fever, increasing pain or increasing wbc count then she will need to undergo urgent IR CT guided drainage.  4.  Continue Zosyn and PPI IV for now.    Subjective:   She is still c/o RUQ pain after meals.  No fever, chills, nausea or vomiting.  WBC count stable.  Receiving Zosyn IV.    Current Facility-Administered Medications   Medication Dose Route Frequency   ??? dextrose 5% - 0.45% NaCl with KCl 20 mEq/L infusion  75 mL/hr IntraVENous CONTINUOUS   ??? ciprofloxacin (CIPRO) 400 mg in D5W IVPB (premix)  400 mg IntraVENous Q12H   ??? metroNIDAZOLE (FLAGYL) IVPB premix 500 mg  500 mg IntraVENous Q8H   ??? ondansetron (ZOFRAN) injection 4 mg  4 mg IntraVENous Q4H PRN   ??? oxyCODONE IR (ROXICODONE) tablet 5 mg  5 mg Oral Q4H PRN   ??? docusate sodium (COLACE) capsule 100 mg  100 mg Oral BID   ??? polyethylene glycol (MIRALAX) packet 17 g  17 g Oral BID   ??? bisacodyL (DULCOLAX) suppository 10 mg  10 mg Rectal DAILY PRN   ??? naloxone (NARCAN) injection 0.4 mg  0.4 mg IntraVENous EVERY 2 MINUTES AS NEEDED   ??? pantoprazole (PROTONIX) 40 mg in 0.9% sodium chloride 10 mL injection   40 mg IntraVENous Q24H   ??? thiamine (B-1) injection 100 mg  100 mg IntraMUSCular DAILY   ??? multivitamin with iron (FLINTSTONES) chewable tablet 1 Tab  1 Tab Oral BID   ??? ferrous sulfate tablet 325 mg  1 Tab Oral DAILY WITH BREAKFAST   ??? dicyclomine (BENTYL) capsule 10 mg  10 mg Oral Q6H PRN   ??? acetaminophen (TYLENOL) tablet 650 mg  650 mg Oral Q6H PRN   ??? sodium chloride (NS) flush 5-10 mL  5-10 mL IntraVENous Q8H   ??? sodium chloride (NS) flush 5-10 mL  5-10 mL IntraVENous PRN   ??? naloxone (NARCAN) injection 0.1 mg  0.1 mg IntraVENous PRN   ??? ondansetron (ZOFRAN) injection 4 mg  4 mg IntraVENous Q6H PRN   ??? HYDROmorphone (DILAUDID) injection 0.5 mg  0.5 mg IntraVENous Q4H PRN          Objective:     Visit Vitals  BP 117/75 (BP 1 Location: Right arm, BP Patient Position: Supine)   Pulse 70   Temp 98.1 ??F (36.7 ??C)   Resp 18   Ht 5' (1.524 m)   Wt 88.9 kg (196 lb)   LMP 04/01/2018 (Exact Date)   SpO2 99%   BMI 38.28 kg/m??  Intake/Output Summary (Last 24 hours) at 04/08/2018 1632  Last data filed at 04/08/2018 0600  Gross per 24 hour   Intake 2000 ml   Output 0 ml   Net 2000 ml       Examination:  Awake, alert, oriented.  Abdomen with RUQ pain and tenderness.  No guarding or rebound.  No mass or ascites.  Warm extremities,  2+ pulses, no edema.    Data Review:    Labs: Results:   Chemistry Recent Labs     04/08/18  0309 04/07/18  1231 04/06/18  0447   GLU 88 95 78   NA 138 136 138   K 3.6 4.0 3.5   CL 104 105 105   CO2 27 26 26    BUN 2* 2* 3*   CREA 0.7 0.5* 0.6   CA 8.4* 8.4* 8.7   AGAP 7 6 7    AP 228* 135* 129*   TP 6.8 6.3* 6.5   ALB 2.3* 2.4* 2.2*    Estimated Creatinine Clearance: 113.5 mL/min (based on SCr of 0.7 mg/dL).   CBC w/Diff Recent Labs     04/06/18  0447   WBC 6.2   RBC 3.00*   HGB 9.3*   HCT 28.8*   PLT 284   GRANS 60.1   LYMPH 24.8*   EOS 1.0      Coagulation No results for input(s): PTP, INR, APTT, INREXT in the last 72 hours.    Hepatitis Panel Lab Results   Component Value Date/Time     Hep B surface Ag screen Negative 12/20/2010 12:00 AM      Amylase Lipase .   Liver Enzymes Recent Labs     04/08/18  0309   TP 6.8   ALB 2.3*   AP 228*   SGOT 149*   ALT 63      Thyroid Studies No results for input(s): T4, T3U, TSH, TSHEXT in the last 72 hours.    No lab exists for component: T3RU     Pathology pathology       Shayan Bramhall P. Leonard Downing, MD, FACP  Pager: [phone]  April 08, 2018

## 2018-04-09 MED ORDER — DIPHENHYDRAMINE 25 MG CAP
25 mg | Freq: Four times a day (QID) | ORAL | Status: DC | PRN
Start: 2018-04-09 — End: 2018-04-12
  Administered 2018-04-11: 02:00:00 via ORAL

## 2018-04-09 MED ORDER — ARTIFICIAL TEARS (DEXTRAN 70-HYPROMELLOSE) 0.1 %-0.3 % EYE DROPS
Freq: Four times a day (QID) | OPHTHALMIC | Status: DC | PRN
Start: 2018-04-09 — End: 2018-04-12
  Administered 2018-04-09 – 2018-04-10 (×3): via OPHTHALMIC

## 2018-04-09 MED FILL — OXYCODONE 5 MG TAB: 5 mg | ORAL | Qty: 1

## 2018-04-09 MED FILL — HYDROMORPHONE 1 MG/ML INJECTION SOLUTION: 1 mg/mL | INTRAMUSCULAR | Qty: 1

## 2018-04-09 MED FILL — DOCUSATE SODIUM 100 MG CAP: 100 mg | ORAL | Qty: 1

## 2018-04-09 MED FILL — CHILDS/IRON CHEWABLE TABLET: ORAL | Qty: 1

## 2018-04-09 MED FILL — POLYETHYLENE GLYCOL 3350 17 GRAM (100 %) ORAL POWDER PACKET: 17 gram | ORAL | Qty: 1

## 2018-04-09 MED FILL — BD POSIFLUSH NORMAL SALINE 0.9 % INJECTION SYRINGE: INTRAMUSCULAR | Qty: 10

## 2018-04-09 MED FILL — METRONIDAZOLE IN SODIUM CHLORIDE (ISO-OSM) 500 MG/100 ML IV PIGGY BACK: 500 mg/100 mL | INTRAVENOUS | Qty: 100

## 2018-04-09 MED FILL — CIPROFLOXACIN IN D5W 400 MG/200 ML IV PIGGY BACK: 400 mg/200 mL | INTRAVENOUS | Qty: 200

## 2018-04-09 MED FILL — GENTEAL TEARS MILD 0.1 %-0.3 % EYE DROPS: OPHTHALMIC | Qty: 15

## 2018-04-09 MED FILL — THIAMINE 100 MG/ML INJECTION: 100 mg/mL | INTRAMUSCULAR | Qty: 2

## 2018-04-09 MED FILL — PANTOPRAZOLE 40 MG IV SOLR: 40 mg | INTRAVENOUS | Qty: 40

## 2018-04-09 MED FILL — FERROUS SULFATE 325 MG (65 MG ELEMENTAL IRON) TAB: 325 mg (65 mg iron) | ORAL | Qty: 1

## 2018-04-09 MED FILL — ONDANSETRON (PF) 4 MG/2 ML INJECTION: 4 mg/2 mL | INTRAMUSCULAR | Qty: 2

## 2018-04-09 NOTE — Progress Notes (Signed)
Surg    Cholelithiasis- gallstones on ultrasound, ct and mri were neg. This may certainly be responsible for her sx but hard to blame the peri-duodenal fluid collection on gb dis. I favor course of Ab and repeat CT with plans for eventual lap chole, ioc and liver biopsy. If fluid collection has resolved no additional intervention needed. Surg will also offer oportunity for trans-gastric endoscopy to evaluate duodenum if necessary. With possibility of contained ulcer perforation it would be best to avoid surg and endoscopy in acute phase.      Feels better, tol po    Blood pressure 107/68, pulse 74, temperature 98.3 ??F (36.8 ??C), resp. rate 16, height 5' (1.524 m), weight 88.9 kg (196 lb), last menstrual period 04/01/2018, SpO2 100 %, unknown if currently breastfeeding.    Looks good  Chest clear  Heart rrr  abd soft, nontender  Ext nl    Ultrasound 1/26- cholelithiasis, fatty liver, 2x3 cm fluid in region of pancreas.

## 2018-04-09 NOTE — Progress Notes (Signed)
Progress Notes by Dellis AnesMakdisi, Keontay Vora F, MD at 04/09/18 1748                Author: Dellis AnesMakdisi, Tykeria Wawrzyniak F, MD  Service: Gastroenterology  Author Type: Physician       Filed: 04/09/18 1936  Date of Service: 04/09/18 1748  Status: Addendum          Editor: Dellis AnesMakdisi, Genelda Roark F, MD (Physician)          Related Notes: Original Note by Jefm Pettyixon, Chantel D, NP (Nurse Practitioner) filed at 04/09/18  1801                       Gastrointestinal Progress Note      Patient Name: Ashley [name]      Today's Date: 04/09/2018      Admit Date: 04/03/2018           Assessment:     --8.5 cm fluid collection versus abscess adjacent to the 3rd and 4th portions of the duodenum seen on MRI probably a sealed-off duodenal ulcer perforation  mimicking acute pancreatitis.     -No MR evidence of gallstones or biliary dilatation.      -RUQ US today showed gallstone and 2.5 cm subhepatic/peripancreatic fluid collection similar to what was seen on MRI.   --Hx of RNYGB surgery.      -EGD with enteroscopy revealed a normal mucosa but unable to reach J-J anastomosis.   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     Recommendation:     --Continue diabetic, low-fat low-cholesterol diet as ordered by Dr. Christell ConstantMoore.   --Continue/complete antibiotic course   --Continue Protonix 40 mg daily   --Agree with MiraLAX 17 g in 8 ounces of H2O p.o. daily.  May increase to MiraLAX 34 g and 16 ounces of H2O p.o. daily to aid in bowel regulation   --Appreciate surgical assistance.  Surgery planning for elective lap chole with IOC and liver biopsy.  Surgical note reviewed. Will be available if transgastric endoscopy to evaluate duodenum is needed   --Surveillance CT abdomen per Surgical recommendations     --Once discharged, pt will need to follow up with Dr. Leonard Downingiongco once cleared per Surgery      Will not actively follow but will be available as needed       Patient seen and examined. No significant abdominal pain this evening. Tolerating po without  nausea or vomiting.    VSS. Lungs clear. Heart regular and rythmic. Abd soft, non-tender.Labs reviewed.   Agree with above assessment and plan.    Rometta EmeryW. Shayra Anton, MD.         Subjective:     Pt reports improvement in her abdominal pain.  She denies nausea or vomiting.  She states she is able to tolerate more consistency in terms of oral nutrition.  No  BM so far today.        Current Facility-Administered Medications          Medication  Dose  Route  Frequency           ?  artificial tears (dextran 70-hypromellose) (NATURAL BALANCE) 0.1-0.3 % ophthalmic solution 1 Drop   1 Drop  Left Eye  QID PRN     ?  diphenhydrAMINE (BENADRYL) capsule 25 mg   25 mg  Oral  Q6H PRN     ?  dextrose 5% - 0.45% NaCl with KCl 20 mEq/L infusion   75 mL/hr  IntraVENous  CONTINUOUS     ?  ciprofloxacin (CIPRO) 400 mg in D5W IVPB (premix)   400 mg  IntraVENous  Q12H     ?  metroNIDAZOLE (FLAGYL) IVPB premix 500 mg   500 mg  IntraVENous  Q8H     ?  ondansetron (ZOFRAN) injection 4 mg   4 mg  IntraVENous  Q4H PRN     ?  oxyCODONE IR (ROXICODONE) tablet 5 mg   5 mg  Oral  Q4H PRN     ?  docusate sodium (COLACE) capsule 100 mg   100 mg  Oral  BID     ?  polyethylene glycol (MIRALAX) packet 17 g   17 g  Oral  BID     ?  bisacodyL (DULCOLAX) suppository 10 mg   10 mg  Rectal  DAILY PRN     ?  naloxone (NARCAN) injection 0.4 mg   0.4 mg  IntraVENous  EVERY 2 MINUTES AS NEEDED     ?  pantoprazole (PROTONIX) 40 mg in 0.9% sodium chloride 10 mL injection   40 mg  IntraVENous  Q24H     ?  thiamine (B-1) injection 100 mg   100 mg  IntraMUSCular  DAILY     ?  multivitamin with iron (FLINTSTONES) chewable tablet 1 Tab   1 Tab  Oral  BID     ?  ferrous sulfate tablet 325 mg   1 Tab  Oral  DAILY WITH BREAKFAST           ?  dicyclomine (BENTYL) capsule 10 mg   10 mg  Oral  Q6H PRN           ?  acetaminophen (TYLENOL) tablet 650 mg   650 mg  Oral  Q6H PRN     ?  sodium chloride (NS) flush 5-10 mL   5-10 mL  IntraVENous  Q8H     ?  sodium chloride (NS) flush  5-10 mL   5-10 mL  IntraVENous  PRN     ?  naloxone (NARCAN) injection 0.1 mg   0.1 mg  IntraVENous  PRN     ?  ondansetron (ZOFRAN) injection 4 mg   4 mg  IntraVENous  Q6H PRN           ?  HYDROmorphone (DILAUDID) injection 0.5 mg   0.5 mg  IntraVENous  Q4H PRN                 Objective:        Visit Vitals      BP  117/69 (BP 1 Location: Right arm, BP Patient Position: Supine)     Pulse  67     Temp  98.2 ??F (36.8 ??C)     Resp  16     Ht  5' (1.524 m)     Wt  88.9 kg (196 lb)     LMP  04/01/2018 (Exact Date)     SpO2  100%        BMI  38.28 kg/m??                 Intake/Output Summary (Last 24 hours) at 04/09/2018 1748   Last data filed at 04/09/2018 1626     Gross per 24 hour        Intake  1610 ml        Output  400 ml        Net  1210 ml  Examination:   Awake, alert, oriented.   Abdomen: RUQ tenderness. No guarding or rebound. No mass or ascites.         Data Review:         Labs:  Results:        Chemistry  Recent Labs         04/08/18   0309  04/07/18   1231      GLU  88  95      NA  138  136      K  3.6  4.0      CL  104  105      CO2  27  26      BUN  2*  2*      CREA  0.7  0.5*      CA  8.4*  8.4*      AGAP  7  6      AP  228*  135*      TP  6.8  6.3*      ALB  2.3*  2.4*          Estimated Creatinine Clearance: 113.5 mL/min (based on SCr of 0.7 mg/dL).        CBC w/Diff  No results for input(s): WBC, RBC, HGB, HCT, PLT, GRANS, LYMPH, EOS, HGBEXT, HCTEXT, PLTEXT, HGBEXT, HCTEXT, PLTEXT in the last 72 hours.     Coagulation  No results for input(s): PTP, INR, APTT, INREXT, INREXT in the last 72 hours.         Hepatitis Panel  Lab Results      Component  Value  Date/Time        Hep B surface Ag screen  Negative  12/20/2010 12:00 AM                 Amylase Lipase  .        Liver Enzymes  Recent Labs         04/08/18   0309      TP  6.8      ALB  2.3*      AP  228*      SGOT  149*      ALT  63                 Thyroid Studies  No results for input(s): T4, T3U, TSH, TSHEXT, TSHEXT in the last 72 hours.       No lab exists for component: T3RU          Pathology  pathology

## 2018-04-09 NOTE — Consults (Signed)
Consults by Foye Clock, MD at 04/09/18 1101                Author: Foye Clock, MD  Service: Infectious Disease  Author Type: Physician       Filed: 04/09/18 1236  Date of Service: 04/09/18 1101  Status: Signed          Editor: Foye Clock, MD (Physician)                                                                                       INFECTIOUS DISEASE CONSULT NOTE          Requested by: Dr. Laveda Norman      Reason for consult: Abd abscess      Date of admission: 04/03/2018      Date of consult: April 09, 2018           ABX:           Current abx  Prior abx         Cipro/flagyl 1/26-1   Zosyn 1/25-1          ASSESSMENT:           Abdominal fluid collection-concerning for abscess secondary to duodenal ulcer perforation   -CT 1/21 thickening of the horizontal portion of duodenum with surrounding inflammatory changes,?  Pancreatitis   -MRI 1/24 s/o lesion within the liver c/w focal fatty infiltration, prominent peripherally enhanced fluid adjacent to pancreatic head/uncinate process with extensive surrounding inflammatory changes?  Developing abscess-likely contained from ruptured  ulcer   -Status post EGD with enteroscopy 1/24 showing normal mucosa but unable to reach JJ anastomosis   -Ultrasound 1/26 with subhepatic fluid collection, cholelithiasis, fatty liver       Right upper quadrant pain   -Suspect secondary to above   -Normal lipase-doubt pancreatitis   -Significantly elevated AST>> ALT but denies alcohol abuse   -No evidence of acute cholecystitis on ultrasound       History of gastric bypass in 2012       Constipation       Obesity with BMI of 38     Anemia     History of sleep apnea       Stress urinary incontinence                RECOMMENDATIONS:        This is complicated patient with history of previous gastric bypass surgery and coming with abdominal pain and finding of fluid collection adjacent to duodenum      -Patient had many imagings recently including ultrasound, CT and MRI.  There is a  concern for 8.5 cm fluid collection?  Abscess at the junction of third and fourth portions of her duodenum with peripancreatic inflammation.  There is a concern it might  be from perforated duodenal ulcer though likely contained now.  Ultrasound showed gallstones but no concern for acute cholecystitis. Limited EGD showed normal mucosa   -Given large fluid collection concerning for abscess, I will continue on antibiotics.  Patient was switched to IV Cipro and Flagyl and tolerating well.  Unfortunately she is not tolerating PO well hence will  need to continue Iv Abx for now    - Monitor WBc, remains afebrile   -H. pylori serology was ordered though will not be very useful as cannot differentiate from active to previous infection.  Patient is already on PPIs and antibiotics hence other antigen testing will not be useful at this time   -We will suggest to repeat imaging next week and if still with significant fluid collection, she will benefit from either IR guided aspiration or surgical intervention   -She had diarrhea 2 weeks ago but now with constipation-suspect secondary to pain medications   -Monitor labs and cultures   -Will not treat bacteria in her urine as asymptomatic   -Multivitamins per others   -D/w Dr Laveda Norman                     MICROBIOLOGY:        1/18 urine culture 20 came mixed flora, 30 K E. coli R quinolones, Bactrim        LINES AND CATHETERS:     PIV        HPI:        Ashley [name] is a 33 y.o.  African-American Ashley [name] with a past medical history of Roux-en-Y gastric bypass in [place]  was admitted 1/25 with complaint of worsening  epigastric abdominal pain.  The patient was seen at [facility] on 1/18 for abdominal pain.  CT showed fatty infiltration and possible pancreatitis?  Duodenitis.  He was also found positive for possible UTI with positive UA.  She was discharged  on oral Keflex and Percocet.  She came back to [facility] next day with persistent abdominal pain.  Her lipase  were normal but had significant abdominal pain.  Repeat CT was done concerning for thickening of the horizontal portion of duodenum with surrounding  inflammatory changes concerning for duodenitis, superimposed also not excluded.  She was admitted for pain management and GI eval.  She was seen by GI and bariatric surgeon Dr. Christell Constant was consulted.  She had MRCP which showed no gallstone, no choledocholithiasis  but 8.5 cm duodenal fluid collection.  She underwent EGD with enteroscopy 1/25 showing normal mucosa but unable to reach JJ anastomosis.  Initially she was maintained off antibiotics but after finding of MRCP, she was started on IV Zosyn on 1/25 and switch  to IV ciprofloxacin and Flagyl on 1/26.  Patient continued to complain of abdominal pain and unable to tolerate p.o. meds or food.  We were asked for further evaluation and management.           Past medical history:          Past Medical History:        Diagnosis  Date         ?  Ill-defined condition            bronchitis         ?  Pneumonia               Past Surgical History:         Procedure  Laterality  Date          ?  HX CESAREAN SECTION    2002, 2004, 2008          x 3          ?  HX CESAREAN SECTION         ?  HX GASTRIC BYPASS  [id number]          [address]          ?  HX GASTRIC BYPASS              ?  HX GASTRIC BYPASS                  Social History:          Social History          Socioeconomic History         ?  Marital status:  SINGLE              Spouse name:  Not on file         ?  Number of children:  Not on file     ?  Years of education:  Not on file     ?  Highest education level:  Not on file       Occupational History        ?  Not on file       Social Needs         ?  Financial resource strain:  Not on file        ?  Food insecurity:              Worry:  Not on file         Inability:  Not on file        ?  Transportation needs:              Medical:  Not on file         Non-medical:  Not on file       Tobacco Use         ?   Smoking status:  Former Smoker              Types:  Cigarettes         Last attempt to quit:  11/12/2009         Years since quitting:  8.4         ?  Smokeless tobacco:  Never Used       Substance and Sexual Activity         ?  Alcohol use:  Not Currently              Alcohol/week:  0.0 - 4.0 standard drinks             Comment: occassional         ?  Drug use:  No     ?  Sexual activity:  Yes              Partners:  Male         Birth control/protection:  None       Lifestyle        ?  Physical activity:              Days per week:  Not on file         Minutes per session:  Not on file         ?  Stress:  Not on file       Relationships        ?  Social connections:              Talks on phone:  Not on file         Gets together:  Not on file         Attends religious service:  Not on file         Active member of club or organization:  Not on file         Attends meetings of clubs or organizations:  Not on file         Relationship status:  Not on file        ?  Intimate partner violence:              Fear of current or ex partner:  Not on file         Emotionally abused:  Not on file         Physically abused:  Not on file         Forced sexual activity:  Not on file        Other Topics  Concern        ?  Not on file       Social History Narrative          ** Merged History Encounter **                        Family History:          Family History         Problem  Relation  Age of Onset          ?  Diabetes  Mother       ?  Hypertension  Father       ?  Heart Disease  Maternal Grandmother       ?  Heart Attack  Maternal Grandmother       ?  High Cholesterol  Maternal Grandmother       ?  Arthritis-osteo  Maternal Grandmother       ?  Stroke  Paternal Aunt            ?  Kidney Disease  Maternal Aunt          Positive for ulcerative colitis in her mom and maternal grandfather with colon cancer        Allergies:          Allergies        Allergen  Reactions         ?  Aspirin  Other (comments)             Stomach  ulcer         ?  Ibuprofen  Unknown (comments)             Gastric Bypass--advised to avoid         ?  Nsaids (Non-Steroidal Anti-Inflammatory Drug)  Other (comments)             Hx gastric bypass                Home Medications:          Medications Prior to Admission        Medication  Sig         ?  ondansetron (ZOFRAN ODT) 4 mg disintegrating tablet  Take 1 Tab by mouth every eight (8) hours as needed for Nausea.         ?  dicyclomine (BENTYL) 20 mg tablet  Take 1 Tab by mouth every six (6) hours as needed (abdominal cramps) for up to 20 doses.         ?  [  EXPIRED] oxyCODONE-acetaminophen (PERCOCET) 5-325 mg per tablet  Take 1 Tab by mouth every four (4) hours as needed for Pain for up to 7 days. Max Daily Amount: 6 Tabs.     ?  cephALEXin (KEFLEX) 500 mg capsule  Take 1 Cap by mouth four (4) times daily for 10 days.     ?  lidocaine (LIDODERM) 5 %  Apply patch to the affected area for 12 hours a day and remove for 12 hours a day.         ?  acetaminophen (TYLENOL) 325 mg tablet  Take 2 Tabs by mouth every six (6) hours as needed for Pain.             Current Medications:          Current Facility-Administered Medications          Medication  Dose  Route  Frequency           ?  dextrose 5% - 0.45% NaCl with KCl 20 mEq/L infusion   75 mL/hr  IntraVENous  CONTINUOUS     ?  ciprofloxacin (CIPRO) 400 mg in D5W IVPB (premix)   400 mg  IntraVENous  Q12H     ?  metroNIDAZOLE (FLAGYL) IVPB premix 500 mg   500 mg  IntraVENous  Q8H     ?  ondansetron (ZOFRAN) injection 4 mg   4 mg  IntraVENous  Q4H PRN     ?  oxyCODONE IR (ROXICODONE) tablet 5 mg   5 mg  Oral  Q4H PRN     ?  docusate sodium (COLACE) capsule 100 mg   100 mg  Oral  BID     ?  polyethylene glycol (MIRALAX) packet 17 g   17 g  Oral  BID     ?  bisacodyL (DULCOLAX) suppository 10 mg   10 mg  Rectal  DAILY PRN     ?  naloxone (NARCAN) injection 0.4 mg   0.4 mg  IntraVENous  EVERY 2 MINUTES AS NEEDED     ?  pantoprazole (PROTONIX) 40 mg in 0.9% sodium chloride  10 mL injection   40 mg  IntraVENous  Q24H     ?  thiamine (B-1) injection 100 mg   100 mg  IntraMUSCular  DAILY           ?  multivitamin with iron (FLINTSTONES) chewable tablet 1 Tab   1 Tab  Oral  BID           ?  ferrous sulfate tablet 325 mg   1 Tab  Oral  DAILY WITH BREAKFAST     ?  dicyclomine (BENTYL) capsule 10 mg   10 mg  Oral  Q6H PRN     ?  acetaminophen (TYLENOL) tablet 650 mg   650 mg  Oral  Q6H PRN     ?  sodium chloride (NS) flush 5-10 mL   5-10 mL  IntraVENous  Q8H     ?  sodium chloride (NS) flush 5-10 mL   5-10 mL  IntraVENous  PRN     ?  naloxone (NARCAN) injection 0.1 mg   0.1 mg  IntraVENous  PRN     ?  ondansetron (ZOFRAN) injection 4 mg   4 mg  IntraVENous  Q6H PRN           ?  HYDROmorphone (DILAUDID) injection 0.5 mg   0.5 mg  IntraVENous  Q4H PRN  Review of Systems:     12 points ROS done. Pertinent positives and negatives are as follows, ROS otherwise negative.      Constitutional: Positive for fever prior to admission, negative chills, diaphoresis. No unexpected weight change.    HENT: Negative for ear pain, congestion, sore throat, rhinorrhea.    Eyes: Negative for pain, redness and visual disturbance.    Respiratory: negative for shortness of breath, cough, chest tightness, wheezing.    Cardiovascular: Negative for chest pain, palpitations and leg swelling.    Gastrointestinal: Positive for nausea, vomiting, abdominal pain and constipation   Genitourinary: Negative for dysuria    Musculoskeletal: Positive for back pain, negative joint pain or muscle aches    Skin: Negative for ulcers, rash   Neurological: Negative for dizziness, syncope, light-headedness or headaches.    Hematological:Negative for easy bruising or bleeding.    Psychiatric/Behavioral: Negative anxiety, depression.         Physical Exam:   Vitals   Temp (24hrs), Avg:98.3 ??F (36.8 ??C), Min:98.1 ??F (36.7 ??C), Max:98.4 ??F (36.9 ??C)      Visit Vitals      BP  107/68 (BP 1 Location: Right arm, BP Patient Position:  Sitting)        Pulse  74     Temp  98.3 ??F (36.8 ??C)     Resp  16     Ht  5' (1.524 m)     Wt  88.9 kg (196 lb)     LMP  04/01/2018 (Exact Date)     SpO2  100%        BMI  38.28 kg/m??           General: Well-developed, 33 y.o. year-old, Ashley [name] , in no acute distress   HEENT: Normocephalic, anicteric sclerae, Pupils equal, round reactive to light, no oropharyngeal lesions. No sinus tenderness.   Neck: Supple, no lymphadenopathy, masses or thyromegaly   Chest: Symmetrical expansion   Lungs: Clear to auscultation bilaterally, no dullness   Heart: Regular rhythm, no murmur, no rub or gallop, No JVD   Abdomen: Soft, tender in epigastric area, no organomegaly, BS+   Musculoskeletal: Normal strength/tone. No edema. No clubbing or cyanosis   CNS: AAOx3. Cranial nerves II-XII intact. Grossly normal.No NR   SKIN: No skin lesion or rash. Dry, warm, intact                 Labs:  Results:        Chemistry  Recent Labs         04/08/18   0309  04/07/18   1231      GLU  88  95      NA  138  136      K  3.6  4.0      CL  104  105      CO2  27  26      BUN  2*  2*      CREA  0.7  0.5*      CA  8.4*  8.4*      AGAP  7  6      AP  228*  135*      TP  6.8  6.3*      ALB  2.3*  2.4*                 CBC w/Diff  No results for input(s): WBC, RBC, HGB, HCT, PLT, GRANS, LYMPH, EOS, HGBEXT, HCTEXT,  PLTEXT in the last 72 hours.        Microbiology  No results for input(s): CULT in the last 72 hours.           Imaging-        Results from Hospital Encounter encounter on 01/20/18     XR KNEE RT MIN 4 V           Narrative  Indication: 79 without injury      4 views right knee show no bony or soft tissue abnormality.              Impression  IMPRESSION: Normal study.                Results from Hospital Encounter encounter on 04/03/18     CT ABD PELV W CONT           Narrative  CT of the abdomen and pelvis       INDICATION:  RUQ pain.       COMPARISON: No relevant studies.      TECHNIQUE: CT of the abdomen and pelvis was performed with nonionic  intravenous   contrast with coronal and sagittal reformatted images.      DICOM format image data is available to non-affiliated external healthcare   facilities or entities on a secure, media free, reciprocally searchable basis   with patient authorization for 12 months following the date of the study.      FINDINGS:      LOWER THORAX: There are bilateral lower lobe infiltrates, left greater than   right. Additional areas of subsegmental atelectasis.      ABDOMEN: There is no free air or free fluid. There is a slightly ill-defined 2.8   cm hypoenhancing lesion within the left medial hepatic lobe adjacent to the   falciform ligament for which MRI of the abdomen with and without contrast is   suggested. There is fatty infiltration of the liver. There are no radiopaque   gallstones. The adrenal glands are unremarkable. There is no hydronephrosis or   nephrolithiasis.      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 There is thickening of   the horizontal portion of the duodenum with surrounding free fluid and   inflammatory change. This is inferior to the uncinate process of the pancreas   and inflammation likely secondary to duodenal inflammation. Lipase normal on   chart review. No definite free air. No dilated loops of bowel.           PELVIS: There is no focal bladder wall thickening. The uterus is present.   Negative for aortic dissection.      OSSEOUS STRUCTURES: Acutely intact.              Impression  IMPRESSION:      1.  Thickening of the horizontal portion of duodenum with surrounding   inflammatory changes. Findings likely reflect duodenitis. Superimposed ulcer not   excluded. While inflammation is near the pancreas, lipase is normal in chart   review.   2.  Fatty infiltration of the liver. 2.8 cm indeterminate lesion within the left   medial hepatic lobe. MRI with and without contrast recommended.   3.  Bibasilar infiltrates/atelectasis.               ---------------------------------------------------------------------------------------------------------------   I have independently examined the patient and reviewed all lab studies and imgaing as well as review of nursing notes and physican notes from the past 24  hours. The plan of care has been discussed with  the patient/relative and all questions are answered.       Dragon medical dictation software was used for portions of this report. Unintended errors may occur.       Foye Clock, MD   04/09/2018      [facility]   [phone]

## 2018-04-09 NOTE — Progress Notes (Signed)
INTERNAL MEDICINE PROGRESS NOTE  Patient: Ashley [name]   Date of Birth: 09/13/1985   MRN: [id number]        Assessment / The Hospital course:     Principle Problems:    Abdominal fluid collection: suspect abscess near the duodenum  - pain appears to be improving.  - on  cipro/flagyl.   - no surgical intervention per GI and general surgery. Uncertain if she is a candidate     For percutaneous drainage. She has a large body habitus.  - I consulted DR. Aartis Desai for long term abx. Management.   - suspect will need repeat ct of abd/pelvis in 4-6 weeks after completion of abx.      RUQ , Epigastric pain radiating to the back, intermittent??  Gastritis and duodenitis, Possible anastomotic ulcer or penetrating PUD    Indeterminate lesion within the left medial hepatic lobe - 2.8 cm   - presented with RUQ , Epigastric pain radiating to the back, intermittent , associated with N/V , lipase normal  - h/o Roux-en-Y gastric bypass  - she diagnosed with pancreatitis few days ago, Lipase normal here was 446 on 1/19, pancrease normal on CT   - on admission afebrile, vitals stable, blood workup is unremarkable, CT abd showed Duodenitis   - started on PPI , GI consult, H. Pylori pending.  -MRCP shows no gallstone, no choledocholithiasis, but reveal ed 8.5cm duodenal             Fluid collection most likely abscess.    Chronic iron anemia - IDA,  iron panel showed iron deficiency, Stable, Po iron added   ??  Constipation: opioid induced.  - No Bm since she has been here according to her.     Code Status: Full code   DVT prophylaxis: pharmacologic and mechanical  Disposition :??home    Today Plan:   Consulted DR. Aartis Desai for long term iv abx.    Add PO iron   Continue with PT/ambulation     Start laxatives,stool softeners.    Subjective:   Interval history:    1/27: clinically improving . Tolerating diet. Still requiring intermittent pain meds and tenderness on palpation of RUQ.    1/26: She denies any n/v but still  complaining of epigastric as well as RUQ pain.  Pain on palpation of RUQ with radiation into the back.     She states that she has not had a bm since she has been here.    Medical Decision Making   Chart, Images and Lab data reviewed, necessary medical Orders placed   Discussed with nursing staff and Case Manager. .     Vitals:    04/08/18 1605 04/08/18 1935 04/09/18 0411 04/09/18 0831   BP: 117/75 125/67 90/62 115/70   Pulse: 70 83 86 79   Resp: 18 18 20 16    Temp: 98.1 ??F (36.7 ??C) 98.2 ??F (36.8 ??C) 98.2 ??F (36.8 ??C) 98.4 ??F (36.9 ??C)   SpO2: 99% 96% 100% 100%   Weight:       Height:         Temp (24hrs), Avg:98.3 ??F (36.8 ??C), Min:98.1 ??F (36.7 ??C), Max:98.4 ??F (36.9 ??C)      Intake/Output Summary (Last 24 hours) at 04/09/2018 1010  Last data filed at 04/09/2018 0639  Gross per 24 hour   Intake 1370 ml   Output ???   Net 1370 ml       Physical Exam:   General Appearance:  Appears in no acute distress.,   Skin:   Skin warm & dry, No rash, No jaundice,   Lymph:  There is no lymphadenopathy,   HEENT:   PERRLA, EOMI, Moist oral mucous membranes, conjunctiva clear,   Neck:   Supple, Without masses,   Lungs:   Clear, No wheezes., No rales., Normal respiratory effort,   Heart:  Regular rate and rhythm, No gallop,   Abdomen:   Soft , Non-distended, Normal bowel sounds and Non-tender,   Extremities:   No edema of legs, Normal pedal and radial pulses,   Neuro:   alert, oriented, affect appropriate, speech fluent, cranial nerves intact, no focal neurological deficits and moves all extremities well    Current medications:     Current Facility-Administered Medications   Medication Dose Route Frequency   ??? dextrose 5% - 0.45% NaCl with KCl 20 mEq/L infusion  75 mL/hr IntraVENous CONTINUOUS   ??? ciprofloxacin (CIPRO) 400 mg in D5W IVPB (premix)  400 mg IntraVENous Q12H   ??? metroNIDAZOLE (FLAGYL) IVPB premix 500 mg  500 mg IntraVENous Q8H   ??? ondansetron (ZOFRAN) injection 4 mg  4 mg IntraVENous Q4H PRN   ??? oxyCODONE IR (ROXICODONE)  tablet 5 mg  5 mg Oral Q4H PRN   ??? docusate sodium (COLACE) capsule 100 mg  100 mg Oral BID   ??? polyethylene glycol (MIRALAX) packet 17 g  17 g Oral BID   ??? bisacodyL (DULCOLAX) suppository 10 mg  10 mg Rectal DAILY PRN   ??? naloxone (NARCAN) injection 0.4 mg  0.4 mg IntraVENous EVERY 2 MINUTES AS NEEDED   ??? pantoprazole (PROTONIX) 40 mg in 0.9% sodium chloride 10 mL injection  40 mg IntraVENous Q24H   ??? thiamine (B-1) injection 100 mg  100 mg IntraMUSCular DAILY   ??? multivitamin with iron (FLINTSTONES) chewable tablet 1 Tab  1 Tab Oral BID   ??? ferrous sulfate tablet 325 mg  1 Tab Oral DAILY WITH BREAKFAST   ??? dicyclomine (BENTYL) capsule 10 mg  10 mg Oral Q6H PRN   ??? acetaminophen (TYLENOL) tablet 650 mg  650 mg Oral Q6H PRN   ??? sodium chloride (NS) flush 5-10 mL  5-10 mL IntraVENous Q8H   ??? sodium chloride (NS) flush 5-10 mL  5-10 mL IntraVENous PRN   ??? naloxone (NARCAN) injection 0.1 mg  0.1 mg IntraVENous PRN   ??? ondansetron (ZOFRAN) injection 4 mg  4 mg IntraVENous Q6H PRN   ??? HYDROmorphone (DILAUDID) injection 0.5 mg  0.5 mg IntraVENous Q4H PRN          Laboratory and Radiology Data :     No results found for this or any previous visit (from the past 24 hour(s)).    XR Results:  Results from Hospital Encounter encounter on 01/20/18   XR KNEE RT MIN 4 V    Narrative Indication: 79 without injury    4 views right knee show no bony or soft tissue abnormality.      Impression IMPRESSION: Normal study.         CT Results:  Results from Hospital Encounter encounter on 04/03/18   CT ABD PELV W CONT    Narrative CT of the abdomen and pelvis     INDICATION:  RUQ pain.      COMPARISON: No relevant studies.    TECHNIQUE: CT of the abdomen and pelvis was performed with nonionic intravenous  contrast with coronal and sagittal reformatted images.    DICOM format image  data is available to non-affiliated external healthcare  facilities or entities on a secure, media free, reciprocally searchable basis  with patient  authorization for 12 months following the date of the study.    FINDINGS:    LOWER THORAX: There are bilateral lower lobe infiltrates, left greater than  right. Additional areas of subsegmental atelectasis.    ABDOMEN: There is no free air or free fluid. There is a slightly ill-defined 2.8  cm hypoenhancing lesion within the left medial hepatic lobe adjacent to the  falciform ligament for which MRI of the abdomen with and without contrast is  suggested. There is fatty infiltration of the liver. There are no radiopaque  gallstones. The adrenal glands are unremarkable. There is no hydronephrosis or  nephrolithiasis.    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 There is thickening of  the horizontal portion of the duodenum with surrounding free fluid and  inflammatory change. This is inferior to the uncinate process of the pancreas  and inflammation likely secondary to duodenal inflammation. Lipase normal on  chart review. No definite free air. No dilated loops of bowel.         PELVIS: There is no focal bladder wall thickening. The uterus is present.  Negative for aortic dissection.    OSSEOUS STRUCTURES: Acutely intact.      Impression IMPRESSION:    1.  Thickening of the horizontal portion of duodenum with surrounding  inflammatory changes. Findings likely reflect duodenitis. Superimposed ulcer not  excluded. While inflammation is near the pancreas, lipase is normal in chart  review.  2.  Fatty infiltration of the liver. 2.8 cm indeterminate lesion within the left  medial hepatic lobe. MRI with and without contrast recommended.  3.  Bibasilar infiltrates/atelectasis.         MRI Results:  Results from Hospital Encounter encounter on 04/03/18   MRI ABD W WO CONT    Narrative MRI ABDOMEN WITH AND WITHOUT CONTRAST    CLINICAL HISTORY: Right upper quadrant pain    COMPARISON: CT dated 04/03/2018    TECHNIQUE: Multiplanar, multisequence imaging of the upper abdomen was performed  before and after  administration of intravenous contrast.    FINDINGS:    Subsegmental atelectasis left greater than right lung base.    2.7 cm focal lesion within the left hepatic lobe, adjacent to the falciform  ligament is consistent with focal hepatic steatosis. This region manifested as  signal dropout on the out of phase sequence. Liver, otherwise grossly  unremarkable. Gallbladder, spleen, adrenal glands and kidneys grossly  unremarkable. Gastric bypass surgery. Inflammatory change adjacent to the  pancreatic head and uncinate process. There is also thickening of the distal  second and proximal third portions of the duodenum. Adjacent to the junction of  the second and third portions of the duodenum, there is a peripherally enhancing  8.5 x 4.6 x 2.8 cm (ML by CC by AP) fluid collection. The fluid collection is  intimate with the inferior margin of the duodenum within this region, and  extends to the right anterior perirenal fascia. This is concerning for a  developing abscess. This may be related to a contained, ruptured ulcer. Other  differential considerations may include pancreatic pseudocyst with possible  superimposed inflammation of the pancreatic head/uncinate process.      Impression IMPRESSION:  1. Lesion within the liver visualized on previous CT is most consistent with  focal fatty infiltration. No aggressive process.  2. Prominent peripherally enhancing fluid collection adjacent to  the pancreatic  head/uncinate process with extensive surrounding inflammatory change as  described. This is concerning for a developing abscess. This may be related to a  contained, ruptured ulcer. Other differential considerations may include  pancreatic pseudocyst with possible superimposed inflammation of the pancreatic  head/uncinate process.  3. Subsegmental atelectasis within the lung bases.         Nuclear Medicine Results:  No results found for this or any previous visit.    [place] Results:  Results from Hospital Encounter encounter  on 04/03/18   [place] ABD LTD    Narrative Indication: Right upper quadrant pain    Sonographic evaluation right upper quadrant shows diffusely echogenic liver.  Adjacent to the left hepatic lobe and superior to the pancreas is a fluid  collection measuring 2.4 x 1.5 x 3.0 cm which appears uncomplicated. A mobile  stone is seen in the gallbladder  without associated focal tenderness or wall  thickening. Common bile duct 2 mm. Pancreas, aorta, IVC, right kidney normal.      Impression IMPRESSION: Subhepatic fluid collection which may correspond to lesions seen on  prior MRI of 04/06/2018. Cholelithiasis without ultrasound criteria for acute  cholecystitis. Fatty liver.         IR Results:  No results found for this or any previous visit.    VAS/US Results:  Results from Hospital Encounter encounter on 01/20/18   DUPLEX LOWER EXT VENOUS RIGHT    Narrative                                                               Study ID:   [id number]                                                 [facility]                                            [address] [employer]  [place],                                          [place]                                           [id number]                                   Lower Extremity Venous Report    Name: NELLIE, PESTER Date: 01/20/2018 08:00 PM  MRN: [id number]                      Patient Location: [id number]  DOB: Jul 31, 1985                  Age: 70 yrs  Gender: Female                   Account #: [id number]  Reason For Study: Right leg pain  Ordering Physician: Thea Silversmith    Performed By: Francee Piccolo, RVS    Interpretation Summary  No evidence of superficial or deep vein thrombosis noted in the right lower  extremity.  No evidence of deep  venous thrombosis in the contralateral/left common   femoral  vein.  _____________________________________________________________________________  __      QUALITY/PROCEDURE  Limited unilateral venous duplex performed of the right leg. Quality of the  study is good. M79.604.    HISTORY/SYMPTOMS  Right leg pain.    RIGHT LEG  The common femoral, femoral, popliteal, posterior tibial and peroneal veins  were examined with duplex ultrasound. The deep veins were patent and  compressible with no evidence of intraluminal thrombus. Spontaneous, phasic  venous flow with normal augmentation noted throughout the right leg.    RIGHT SAPHENOUS VEINS  Spectral Doppler exam demonstrates normal venous flow in the right great  saphenous vein. Compression of the right great saphenous vein is complete.  Filling defects in the right great saphenous vein are absent.      LEFT LEG  Spectral Doppler exam demonstrates normal venous flow in the left common  femoral vein. Compression of the left common femoral vein is complete.   Filling  defects in the left common femoral vein are absent.      Electronically signed byDR Billie Ruddy, MD   01/21/2018 01:10 AM       Admission HPI:           Marnee Guarneri M.D.  [facility]  Page [phone]

## 2018-04-09 NOTE — Progress Notes (Signed)
INTERNAL MEDICINE PROGRESS NOTE  Patient: Ashley [name]   Date of Birth: 26-Sep-1985   MRN: [id number]        Assessment / The Hospital course:     Principle Problems:    Abdominal fluid collection: suspect abscess near the duodenum  - pain appears to be improving.  - on  cipro/flagyl.   - no surgical intervention per GI and general surgery. Uncertain if she is a candidate     For percutaneous drainage. She has a large body habitus.  - I consulted DR. Aartis Desai for long term abx. Management.   - suspect will need repeat ct of abd/pelvis in 4-6 weeks after completion of abx.      RUQ , Epigastric pain radiating to the back, intermittent??  Gastritis and duodenitis, Possible anastomotic ulcer or penetrating PUD    Indeterminate lesion within the left medial hepatic lobe - 2.8 cm   - presented with RUQ , Epigastric pain radiating to the back, intermittent , associated with N/V , lipase normal  - h/o Roux-en-Y gastric bypass  - she diagnosed with pancreatitis few days ago, Lipase normal here was 446 on 1/19, pancrease normal on CT   - on admission afebrile, vitals stable, blood workup is unremarkable, CT abd showed Duodenitis   - started on PPI , GI consult, H. Pylori pending.  -MRCP shows no gallstone, no choledocholithiasis, but reveal ed 8.5cm duodenal             Fluid collection most likely abscess.    Chronic iron anemia - IDA,  iron panel showed iron deficiency, Stable, Po iron added   ??  Constipation: opioid induced.  - No Bm since she has been here according to her.     Code Status: Full code   DVT prophylaxis: pharmacologic and mechanical  Disposition :??home    Today Plan:   Consulted DR. Aartis Desai for long term iv abx.    Add PO iron   Continue with PT/ambulation     Start laxatives,stool softeners.    Subjective:   Interval history:    1/27: clinically improving . Tolerating diet. Still requiring intermittent pain meds and tenderness on palpation of RUQ.     1/26: She denies any n/v but still complaining of epigastric as well as RUQ pain.  Pain on palpation of RUQ with radiation into the back.     She states that she has not had a bm since she has been here.    Medical Decision Making   Chart, Images and Lab data reviewed, necessary medical Orders placed   Discussed with nursing staff and Case Manager. .     Vitals:    04/08/18 1605 04/08/18 1935 04/09/18 0411 04/09/18 0831   BP: 117/75 125/67 90/62 115/70   Pulse: 70 83 86 79   Resp: 18 18 20 16    Temp: 98.1 ??F (36.7 ??C) 98.2 ??F (36.8 ??C) 98.2 ??F (36.8 ??C) 98.4 ??F (36.9 ??C)   SpO2: 99% 96% 100% 100%   Weight:       Height:         Temp (24hrs), Avg:98.3 ??F (36.8 ??C), Min:98.1 ??F (36.7 ??C), Max:98.4 ??F (36.9 ??C)      Intake/Output Summary (Last 24 hours) at 04/09/2018 1010  Last data filed at 04/09/2018 0639  Gross per 24 hour   Intake 1370 ml   Output ???   Net 1370 ml       Physical Exam:   General Appearance:  Appears in no acute distress.,   Skin:   Skin warm & dry, No rash, No jaundice,   Lymph:  There is no lymphadenopathy,   HEENT:   PERRLA, EOMI, Moist oral mucous membranes, conjunctiva clear,   Neck:   Supple, Without masses,   Lungs:   Clear, No wheezes., No rales., Normal respiratory effort,   Heart:  Regular rate and rhythm, No gallop,   Abdomen:   Soft , Non-distended, Normal bowel sounds and Non-tender,   Extremities:   No edema of legs, Normal pedal and radial pulses,   Neuro:   alert, oriented, affect appropriate, speech fluent, cranial nerves intact, no focal neurological deficits and moves all extremities well    Current medications:     Current Facility-Administered Medications   Medication Dose Route Frequency   ??? dextrose 5% - 0.45% NaCl with KCl 20 mEq/L infusion  75 mL/hr IntraVENous CONTINUOUS   ??? ciprofloxacin (CIPRO) 400 mg in D5W IVPB (premix)  400 mg IntraVENous Q12H   ??? metroNIDAZOLE (FLAGYL) IVPB premix 500 mg  500 mg IntraVENous Q8H    ??? ondansetron (ZOFRAN) injection 4 mg  4 mg IntraVENous Q4H PRN   ??? oxyCODONE IR (ROXICODONE) tablet 5 mg  5 mg Oral Q4H PRN   ??? docusate sodium (COLACE) capsule 100 mg  100 mg Oral BID   ??? polyethylene glycol (MIRALAX) packet 17 g  17 g Oral BID   ??? bisacodyL (DULCOLAX) suppository 10 mg  10 mg Rectal DAILY PRN   ??? naloxone (NARCAN) injection 0.4 mg  0.4 mg IntraVENous EVERY 2 MINUTES AS NEEDED   ??? pantoprazole (PROTONIX) 40 mg in 0.9% sodium chloride 10 mL injection  40 mg IntraVENous Q24H   ??? thiamine (B-1) injection 100 mg  100 mg IntraMUSCular DAILY   ??? multivitamin with iron (FLINTSTONES) chewable tablet 1 Tab  1 Tab Oral BID   ??? ferrous sulfate tablet 325 mg  1 Tab Oral DAILY WITH BREAKFAST   ??? dicyclomine (BENTYL) capsule 10 mg  10 mg Oral Q6H PRN   ??? acetaminophen (TYLENOL) tablet 650 mg  650 mg Oral Q6H PRN   ??? sodium chloride (NS) flush 5-10 mL  5-10 mL IntraVENous Q8H   ??? sodium chloride (NS) flush 5-10 mL  5-10 mL IntraVENous PRN   ??? naloxone (NARCAN) injection 0.1 mg  0.1 mg IntraVENous PRN   ??? ondansetron (ZOFRAN) injection 4 mg  4 mg IntraVENous Q6H PRN   ??? HYDROmorphone (DILAUDID) injection 0.5 mg  0.5 mg IntraVENous Q4H PRN          Laboratory and Radiology Data :     No results found for this or any previous visit (from the past 24 hour(s)).    XR Results:  Results from Hospital Encounter encounter on 01/20/18   XR KNEE RT MIN 4 V    Narrative Indication: 79 without injury    4 views right knee show no bony or soft tissue abnormality.      Impression IMPRESSION: Normal study.         CT Results:  Results from Hospital Encounter encounter on 04/03/18   CT ABD PELV W CONT    Narrative CT of the abdomen and pelvis     INDICATION:  RUQ pain.      COMPARISON: No relevant studies.    TECHNIQUE: CT of the abdomen and pelvis was performed with nonionic intravenous  contrast with coronal and sagittal reformatted images.    DICOM format image  data is available to non-affiliated external healthcare   facilities or entities on a secure, media free, reciprocally searchable basis  with patient authorization for 12 months following the date of the study.    FINDINGS:    LOWER THORAX: There are bilateral lower lobe infiltrates, left greater than  right. Additional areas of subsegmental atelectasis.    ABDOMEN: There is no free air or free fluid. There is a slightly ill-defined 2.8  cm hypoenhancing lesion within the left medial hepatic lobe adjacent to the  falciform ligament for which MRI of the abdomen with and without contrast is  suggested. There is fatty infiltration of the liver. There are no radiopaque  gallstones. The adrenal glands are unremarkable. There is no hydronephrosis or  nephrolithiasis.    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 There is thickening of  the horizontal portion of the duodenum with surrounding free fluid and  inflammatory change. This is inferior to the uncinate process of the pancreas  and inflammation likely secondary to duodenal inflammation. Lipase normal on  chart review. No definite free air. No dilated loops of bowel.         PELVIS: There is no focal bladder wall thickening. The uterus is present.  Negative for aortic dissection.    OSSEOUS STRUCTURES: Acutely intact.      Impression IMPRESSION:    1.  Thickening of the horizontal portion of duodenum with surrounding  inflammatory changes. Findings likely reflect duodenitis. Superimposed ulcer not  excluded. While inflammation is near the pancreas, lipase is normal in chart  review.  2.  Fatty infiltration of the liver. 2.8 cm indeterminate lesion within the left  medial hepatic lobe. MRI with and without contrast recommended.  3.  Bibasilar infiltrates/atelectasis.         MRI Results:  Results from Hospital Encounter encounter on 04/03/18   MRI ABD W WO CONT    Narrative MRI ABDOMEN WITH AND WITHOUT CONTRAST    CLINICAL HISTORY: Right upper quadrant pain    COMPARISON: CT dated 04/03/2018     TECHNIQUE: Multiplanar, multisequence imaging of the upper abdomen was performed  before and after administration of intravenous contrast.    FINDINGS:    Subsegmental atelectasis left greater than right lung base.    2.7 cm focal lesion within the left hepatic lobe, adjacent to the falciform  ligament is consistent with focal hepatic steatosis. This region manifested as  signal dropout on the out of phase sequence. Liver, otherwise grossly  unremarkable. Gallbladder, spleen, adrenal glands and kidneys grossly  unremarkable. Gastric bypass surgery. Inflammatory change adjacent to the  pancreatic head and uncinate process. There is also thickening of the distal  second and proximal third portions of the duodenum. Adjacent to the junction of  the second and third portions of the duodenum, there is a peripherally enhancing  8.5 x 4.6 x 2.8 cm (ML by CC by AP) fluid collection. The fluid collection is  intimate with the inferior margin of the duodenum within this region, and  extends to the right anterior perirenal fascia. This is concerning for a  developing abscess. This may be related to a contained, ruptured ulcer. Other  differential considerations may include pancreatic pseudocyst with possible  superimposed inflammation of the pancreatic head/uncinate process.      Impression IMPRESSION:  1. Lesion within the liver visualized on previous CT is most consistent with  focal fatty infiltration. No aggressive process.  2. Prominent peripherally enhancing fluid collection adjacent to  the pancreatic  head/uncinate process with extensive surrounding inflammatory change as  described. This is concerning for a developing abscess. This may be related to a  contained, ruptured ulcer. Other differential considerations may include  pancreatic pseudocyst with possible superimposed inflammation of the pancreatic  head/uncinate process.  3. Subsegmental atelectasis within the lung bases.         Nuclear Medicine Results:   No results found for this or any previous visit.    [place] Results:  Results from Hospital Encounter encounter on 04/03/18   [place] ABD LTD    Narrative Indication: Right upper quadrant pain    Sonographic evaluation right upper quadrant shows diffusely echogenic liver.  Adjacent to the left hepatic lobe and superior to the pancreas is a fluid  collection measuring 2.4 x 1.5 x 3.0 cm which appears uncomplicated. A mobile  stone is seen in the gallbladder  without associated focal tenderness or wall  thickening. Common bile duct 2 mm. Pancreas, aorta, IVC, right kidney normal.      Impression IMPRESSION: Subhepatic fluid collection which may correspond to lesions seen on  prior MRI of 04/06/2018. Cholelithiasis without ultrasound criteria for acute  cholecystitis. Fatty liver.         IR Results:  No results found for this or any previous visit.    VAS/US Results:  Results from Hospital Encounter encounter on 01/20/18   DUPLEX LOWER EXT VENOUS RIGHT    Narrative                                                               Study ID:   [id number]                                                 [facility]                                            [address] [employer]  [place],                                          [place]                                           [id number]                                   Lower Extremity Venous Report    Name: JEARLEAN, LICCIARDI Date: 01/20/2018 08:00 PM  MRN: [id number]                      Patient Location: [id number]  DOB: Jul 30, 1985                  Age: 84 yrs  Gender: Female                   Account #: [id number]  Reason For Study: Right leg pain  Ordering Physician: Thea Silversmith    Performed By: Francee Piccolo, RVS    Interpretation Summary   No evidence of superficial or deep vein thrombosis noted in the right lower  extremity.  No evidence of deep venous thrombosis in the contralateral/left common   femoral  vein.  _____________________________________________________________________________  __      QUALITY/PROCEDURE  Limited unilateral venous duplex performed of the right leg. Quality of the  study is good. M79.604.    HISTORY/SYMPTOMS  Right leg pain.    RIGHT LEG  The common femoral, femoral, popliteal, posterior tibial and peroneal veins  were examined with duplex ultrasound. The deep veins were patent and  compressible with no evidence of intraluminal thrombus. Spontaneous, phasic  venous flow with normal augmentation noted throughout the right leg.    RIGHT SAPHENOUS VEINS  Spectral Doppler exam demonstrates normal venous flow in the right great  saphenous vein. Compression of the right great saphenous vein is complete.  Filling defects in the right great saphenous vein are absent.      LEFT LEG  Spectral Doppler exam demonstrates normal venous flow in the left common  femoral vein. Compression of the left common femoral vein is complete.   Filling  defects in the left common femoral vein are absent.      Electronically signed byDR Billie Ruddy, MD   01/21/2018 01:10 AM       Admission HPI:           Marnee Guarneri M.D.  [facility]  Page [phone]

## 2018-04-09 NOTE — Progress Notes (Addendum)
Gastrointestinal Progress Note    Patient Name: Ashley [name]    JGOTL'X Date: 04/09/2018    Admit Date: 04/03/2018      Assessment:   --8.5 cm fluid collection versus abscess adjacent to the 3rd and 4th portions of the duodenum seen on MRI probably a sealed-off duodenal ulcer perforation mimicking acute pancreatitis.    -No MR evidence of gallstones or biliary dilatation.     -RUQ [place] today showed gallstone and 2.5 cm subhepatic/peripancreatic fluid collection similar to what was seen on MRI.  --Hx of RNYGB surgery.     -EGD with enteroscopy revealed a normal mucosa but unable to reach J-J anastomosis.  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  Recommendation:   --Continue diabetic, low-fat low-cholesterol diet as ordered by Dr. Christell Constant.  --Continue/complete antibiotic course  --Continue Protonix 40 mg daily  --Agree with MiraLAX 17 g in 8 ounces of H2O p.o. daily.  May increase to MiraLAX 34 g and 16 ounces of H2O p.o. daily to aid in bowel regulation  --Appreciate surgical assistance.  Surgery planning for elective lap chole with IOC and liver biopsy.  Surgical note reviewed. Will be available if transgastric endoscopy to evaluate duodenum is needed  --Surveillance CT abdomen per Surgical recommendations    --Once discharged, pt will need to follow up with Dr. Leonard Downing once cleared per Surgery    Will not actively follow but will be available as needed     Patient seen and examined. No significant abdominal pain this evening. Tolerating po without nausea or vomiting.   VSS. Lungs clear. Heart regular and rythmic. Abd soft, non-tender.Labs reviewed.  Agree with above assessment and plan.   Rometta Emery, MD.     Subjective:   Pt reports improvement in her abdominal pain.  She denies nausea or vomiting.  She states she is able to tolerate more consistency in terms of oral nutrition.  No BM so far today.    Current Facility-Administered Medications   Medication Dose Route Frequency    ??? artificial tears (dextran 70-hypromellose) (NATURAL BALANCE) 0.1-0.3 % ophthalmic solution 1 Drop  1 Drop Left Eye QID PRN   ??? diphenhydrAMINE (BENADRYL) capsule 25 mg  25 mg Oral Q6H PRN   ??? dextrose 5% - 0.45% NaCl with KCl 20 mEq/L infusion  75 mL/hr IntraVENous CONTINUOUS   ??? ciprofloxacin (CIPRO) 400 mg in D5W IVPB (premix)  400 mg IntraVENous Q12H   ??? metroNIDAZOLE (FLAGYL) IVPB premix 500 mg  500 mg IntraVENous Q8H   ??? ondansetron (ZOFRAN) injection 4 mg  4 mg IntraVENous Q4H PRN   ??? oxyCODONE IR (ROXICODONE) tablet 5 mg  5 mg Oral Q4H PRN   ??? docusate sodium (COLACE) capsule 100 mg  100 mg Oral BID   ??? polyethylene glycol (MIRALAX) packet 17 g  17 g Oral BID   ??? bisacodyL (DULCOLAX) suppository 10 mg  10 mg Rectal DAILY PRN   ??? naloxone (NARCAN) injection 0.4 mg  0.4 mg IntraVENous EVERY 2 MINUTES AS NEEDED   ??? pantoprazole (PROTONIX) 40 mg in 0.9% sodium chloride 10 mL injection  40 mg IntraVENous Q24H   ??? thiamine (B-1) injection 100 mg  100 mg IntraMUSCular DAILY   ??? multivitamin with iron (FLINTSTONES) chewable tablet 1 Tab  1 Tab Oral BID   ??? ferrous sulfate tablet 325 mg  1 Tab Oral DAILY WITH BREAKFAST   ??? dicyclomine (BENTYL) capsule 10 mg  10 mg Oral Q6H PRN   ??? acetaminophen (  TYLENOL) tablet 650 mg  650 mg Oral Q6H PRN   ??? sodium chloride (NS) flush 5-10 mL  5-10 mL IntraVENous Q8H   ??? sodium chloride (NS) flush 5-10 mL  5-10 mL IntraVENous PRN   ??? naloxone (NARCAN) injection 0.1 mg  0.1 mg IntraVENous PRN   ??? ondansetron (ZOFRAN) injection 4 mg  4 mg IntraVENous Q6H PRN   ??? HYDROmorphone (DILAUDID) injection 0.5 mg  0.5 mg IntraVENous Q4H PRN          Objective:     Visit Vitals  BP 117/69 (BP 1 Location: Right arm, BP Patient Position: Supine)   Pulse 67   Temp 98.2 ??F (36.8 ??C)   Resp 16   Ht 5' (1.524 m)   Wt 88.9 kg (196 lb)   LMP 04/01/2018 (Exact Date)   SpO2 100%   BMI 38.28 kg/m??           Intake/Output Summary (Last 24 hours) at 04/09/2018 1748  Last data filed at 04/09/2018 1626   Gross per 24 hour   Intake 1610 ml   Output 400 ml   Net 1210 ml       Examination:  Awake, alert, oriented.  Abdomen: RUQ tenderness. No guarding or rebound. No mass or ascites.      Data Review:    Labs: Results:   Chemistry Recent Labs     04/08/18  0309 04/07/18  1231   GLU 88 95   NA 138 136   K 3.6 4.0   CL 104 105   CO2 27 26   BUN 2* 2*   CREA 0.7 0.5*   CA 8.4* 8.4*   AGAP 7 6   AP 228* 135*   TP 6.8 6.3*   ALB 2.3* 2.4*    Estimated Creatinine Clearance: 113.5 mL/min (based on SCr of 0.7 mg/dL).   CBC w/Diff No results for input(s): WBC, RBC, HGB, HCT, PLT, GRANS, LYMPH, EOS, HGBEXT, HCTEXT, PLTEXT, HGBEXT, HCTEXT, PLTEXT in the last 72 hours.   Coagulation No results for input(s): PTP, INR, APTT, INREXT, INREXT in the last 72 hours.    Hepatitis Panel Lab Results   Component Value Date/Time    Hep B surface Ag screen Negative 12/20/2010 12:00 AM      Amylase Lipase .   Liver Enzymes Recent Labs     04/08/18  0309   TP 6.8   ALB 2.3*   AP 228*   SGOT 149*   ALT 63      Thyroid Studies No results for input(s): T4, T3U, TSH, TSHEXT, TSHEXT in the last 72 hours.    No lab exists for component: T3RU     Pathology pathology

## 2018-04-09 NOTE — Consults (Signed)
INFECTIOUS DISEASE CONSULT NOTE       Requested by: Dr. Laveda Norman    Reason for consult: Abd abscess    Date of admission: 04/03/2018    Date of consult: April 09, 2018      ABX:     Current abx Prior abx    Cipro/flagyl 1/26-1  Zosyn 1/25???1     ASSESSMENT:      Abdominal fluid collection-concerning for abscess secondary to duodenal ulcer perforation  -CT 1/21 thickening of the horizontal portion of duodenum with surrounding inflammatory changes,?  Pancreatitis  -MRI 1/24 s/o lesion within the liver c/w focal fatty infiltration, prominent peripherally enhanced fluid adjacent to pancreatic head/uncinate process with extensive surrounding inflammatory changes?  Developing abscess-likely contained from ruptured ulcer  -Status post EGD with enteroscopy 1/24 showing normal mucosa but unable to reach JJ anastomosis  -Ultrasound 1/26 with subhepatic fluid collection, cholelithiasis, fatty liver   Right upper quadrant pain  -Suspect secondary to above  -Normal lipase-doubt pancreatitis  -Significantly elevated AST>> ALT but denies alcohol abuse  -No evidence of acute cholecystitis on ultrasound   History of gastric bypass in 2012   Constipation   Obesity with BMI of 38   Anemia   History of sleep apnea   Stress urinary incontinence         RECOMMENDATIONS:     This is complicated patient with history of previous gastric bypass surgery and coming with abdominal pain and finding of fluid collection adjacent to duodenum    -Patient had many imagings recently including ultrasound, CT and MRI.  There is a concern for 8.5 cm fluid collection?  Abscess at the junction of third and fourth portions of her duodenum with peripancreatic inflammation.  There is a concern it might be from perforated duodenal ulcer though likely contained now.  Ultrasound showed gallstones but no concern for acute cholecystitis. Limited EGD showed normal mucosa   -Given large fluid collection concerning for abscess, I will continue on antibiotics.  Patient was switched to IV Cipro and Flagyl and tolerating well.  Unfortunately she is not tolerating PO well hence will need to continue Iv Abx for now   - Monitor WBc, remains afebrile  -H. pylori serology was ordered though will not be very useful as cannot differentiate from active to previous infection.  Patient is already on PPIs and antibiotics hence other antigen testing will not be useful at this time  -We will suggest to repeat imaging next week and if still with significant fluid collection, she will benefit from either IR guided aspiration or surgical intervention  -She had diarrhea 2 weeks ago but now with constipation-suspect secondary to pain medications  -Monitor labs and cultures  -Will not treat bacteria in her urine as asymptomatic  -Multivitamins per others  -D/w Dr Laveda Norman                 MICROBIOLOGY:     1/18 urine culture 20 came mixed flora, 30 K E. coli R quinolones, Bactrim    LINES AND CATHETERS:   PIV    HPI:     Ashley [name] is a 33 y.o.  African-American female with a past medical history of Roux-en-Y gastric bypass in [place]  was admitted 1/25 with complaint of worsening epigastric abdominal pain.  The patient was seen at [facility] on 1/18 for abdominal pain.  CT showed fatty infiltration and possible pancreatitis?  Duodenitis.  He was also found positive for possible UTI with  positive UA.  She was discharged on oral Keflex and Percocet.  [facility] with persistent abdominal pain.  Her lipase were normal but had significant abdominal pain.  Repeat CT was done concerning for thickening of the horizontal portion of duodenum with surrounding inflammatory changes concerning for duodenitis, superimposed also not excluded.  She was admitted for pain management and GI eval.  She was seen by GI and  bariatric surgeon Dr. Christell ConstantMoore was consulted.  She had MRCP which showed no gallstone, no choledocholithiasis but 8.5 cm duodenal fluid collection.  She underwent EGD with enteroscopy 1/25 showing normal mucosa but unable to reach JJ anastomosis.  Initially she was maintained off antibiotics but after finding of MRCP, she was started on IV Zosyn on 1/25 and switch to IV ciprofloxacin and Flagyl on 1/26.  Patient continued to complain of abdominal pain and unable to tolerate p.o. meds or food.  We were asked for further evaluation and management.      Past medical history:     Past Medical History:   Diagnosis Date   ??? Ill-defined condition     bronchitis   ??? Pneumonia        Past Surgical History:   Procedure Laterality Date   ??? HX CESAREAN SECTION  2002, 2004, 2008    x 3   ??? HX CESAREAN SECTION     ??? [place]    [facility]   ??? HX GASTRIC BYPASS     ??? HX GASTRIC BYPASS          Social History:     Social History     Socioeconomic History   ??? Marital status: SINGLE     Spouse name: Not on file   ??? Number of children: Not on file   ??? Years of education: Not on file   ??? Highest education level: Not on file   Occupational History   ??? Not on file   Social Needs   ??? Financial resource strain: Not on file   ??? Food insecurity:     Worry: Not on file     Inability: Not on file   ??? Transportation needs:     Medical: Not on file     Non-medical: Not on file   Tobacco Use   ??? Smoking status: Former Smoker     Types: Cigarettes     Last attempt to quit: 11/12/2009     Years since quitting: 8.4   ??? Smokeless tobacco: Never Used   Substance and Sexual Activity   ??? Alcohol use: Not Currently     Alcohol/week: 0.0 - 4.0 standard drinks     Comment: occassional   ??? Drug use: No   ??? Sexual activity: Yes     Partners: Male     Birth control/protection: None   Lifestyle   ??? Physical activity:     Days per week: Not on file     Minutes per session: Not on file   ??? Stress: Not on file   Relationships    ??? Social connections:     Talks on phone: Not on file     Gets together: Not on file     Attends religious service: Not on file     Active member of club or organization: Not on file     Attends meetings of clubs or organizations: Not on file     Relationship status: Not on file   ???  Intimate partner violence:     Fear of current or ex partner: Not on file     Emotionally abused: Not on file     Physically abused: Not on file     Forced sexual activity: Not on file   Other Topics Concern   ??? Not on file   Social History Narrative    ** Merged History Encounter **            Family History:     Family History   Problem Relation Age of Onset   ??? Diabetes Mother    ??? Hypertension Father    ??? Heart Disease Maternal Grandmother    ??? Heart Attack Maternal Grandmother    ??? High Cholesterol Maternal Grandmother    ??? Arthritis-osteo Maternal Grandmother    ??? Stroke Paternal Aunt    ??? Kidney Disease Maternal Aunt      Positive for ulcerative colitis in her mom and maternal grandfather with colon cancer    Allergies:     Allergies   Allergen Reactions   ??? Aspirin Other (comments)     Stomach ulcer   ??? Ibuprofen Unknown (comments)     Gastric Bypass--advised to avoid   ??? Nsaids (Non-Steroidal Anti-Inflammatory Drug) Other (comments)     Hx gastric bypass         Home Medications:     Medications Prior to Admission   Medication Sig   ??? ondansetron (ZOFRAN ODT) 4 mg disintegrating tablet Take 1 Tab by mouth every eight (8) hours as needed for Nausea.   ??? dicyclomine (BENTYL) 20 mg tablet Take 1 Tab by mouth every six (6) hours as needed (abdominal cramps) for up to 20 doses.   ??? [EXPIRED] oxyCODONE-acetaminophen (PERCOCET) 5-325 mg per tablet Take 1 Tab by mouth every four (4) hours as needed for Pain for up to 7 days. Max Daily Amount: 6 Tabs.   ??? cephALEXin (KEFLEX) 500 mg capsule Take 1 Cap by mouth four (4) times daily for 10 days.   ??? lidocaine (LIDODERM) 5 % Apply patch to the affected area for 12 hours a  day and remove for 12 hours a day.   ??? acetaminophen (TYLENOL) 325 mg tablet Take 2 Tabs by mouth every six (6) hours as needed for Pain.       Current Medications:     Current Facility-Administered Medications   Medication Dose Route Frequency   ??? dextrose 5% - 0.45% NaCl with KCl 20 mEq/L infusion  75 mL/hr IntraVENous CONTINUOUS   ??? ciprofloxacin (CIPRO) 400 mg in D5W IVPB (premix)  400 mg IntraVENous Q12H   ??? metroNIDAZOLE (FLAGYL) IVPB premix 500 mg  500 mg IntraVENous Q8H   ??? ondansetron (ZOFRAN) injection 4 mg  4 mg IntraVENous Q4H PRN   ??? oxyCODONE IR (ROXICODONE) tablet 5 mg  5 mg Oral Q4H PRN   ??? docusate sodium (COLACE) capsule 100 mg  100 mg Oral BID   ??? polyethylene glycol (MIRALAX) packet 17 g  17 g Oral BID   ??? bisacodyL (DULCOLAX) suppository 10 mg  10 mg Rectal DAILY PRN   ??? naloxone (NARCAN) injection 0.4 mg  0.4 mg IntraVENous EVERY 2 MINUTES AS NEEDED   ??? pantoprazole (PROTONIX) 40 mg in 0.9% sodium chloride 10 mL injection  40 mg IntraVENous Q24H   ??? thiamine (B-1) injection 100 mg  100 mg IntraMUSCular DAILY   ??? multivitamin with iron (FLINTSTONES) chewable tablet 1 Tab  1 Tab Oral BID   ???  ferrous sulfate tablet 325 mg  1 Tab Oral DAILY WITH BREAKFAST   ??? dicyclomine (BENTYL) capsule 10 mg  10 mg Oral Q6H PRN   ??? acetaminophen (TYLENOL) tablet 650 mg  650 mg Oral Q6H PRN   ??? sodium chloride (NS) flush 5-10 mL  5-10 mL IntraVENous Q8H   ??? sodium chloride (NS) flush 5-10 mL  5-10 mL IntraVENous PRN   ??? naloxone (NARCAN) injection 0.1 mg  0.1 mg IntraVENous PRN   ??? ondansetron (ZOFRAN) injection 4 mg  4 mg IntraVENous Q6H PRN   ??? HYDROmorphone (DILAUDID) injection 0.5 mg  0.5 mg IntraVENous Q4H PRN       Review of Systems:   12 points ROS done. Pertinent positives and negatives are as follows, ROS otherwise negative.    Constitutional: Positive for fever prior to admission, negative chills, diaphoresis. No unexpected weight change.    HENT: Negative for ear pain, congestion, sore throat, rhinorrhea.   Eyes: Negative for pain, redness and visual disturbance.   Respiratory: negative for shortness of breath, cough, chest tightness, wheezing.   Cardiovascular: Negative for chest pain, palpitations and leg swelling.   Gastrointestinal: Positive for nausea, vomiting, abdominal pain and constipation  Genitourinary: Negative for dysuria   Musculoskeletal: Positive for back pain, negative joint pain or muscle aches   Skin: Negative for ulcers, rash  Neurological: Negative for dizziness, syncope, light-headedness or headaches.   Hematological:Negative for easy bruising or bleeding.   Psychiatric/Behavioral: Negative anxiety, depression.      Physical Exam:  Vitals  Temp (24hrs), Avg:98.3 ??F (36.8 ??C), Min:98.1 ??F (36.7 ??C), Max:98.4 ??F (36.9 ??C)    Visit Vitals  BP 107/68 (BP 1 Location: Right arm, BP Patient Position: Sitting)   Pulse 74   Temp 98.3 ??F (36.8 ??C)   Resp 16   Ht 5' (1.524 m)   Wt 88.9 kg (196 lb)   LMP 04/01/2018 (Exact Date)   SpO2 100%   BMI 38.28 kg/m??       General: Well-developed, 33 y.o. year-old, female, in no acute distress  HEENT: Normocephalic, anicteric sclerae, Pupils equal, round reactive to light, no oropharyngeal lesions. No sinus tenderness.  Neck: Supple, no lymphadenopathy, masses or thyromegaly  Chest: Symmetrical expansion  Lungs: Clear to auscultation bilaterally, no dullness  Heart: Regular rhythm, no murmur, no rub or gallop, No JVD  Abdomen: Soft, tender in epigastric area, no organomegaly, BS+  Musculoskeletal: Normal strength/tone. No edema. No clubbing or cyanosis  CNS: AAOx3. Cranial nerves II-XII intact. Grossly normal.No NR  SKIN: No skin lesion or rash. Dry, warm, intact          Labs: Results:   Chemistry Recent Labs     04/08/18  0309 04/07/18  1231   GLU 88 95   NA 138 136   K 3.6 4.0   CL 104 105   CO2 27 26   BUN 2* 2*   CREA 0.7 0.5*   CA 8.4* 8.4*   AGAP 7 6   AP 228* 135*   TP 6.8 6.3*    ALB 2.3* 2.4*      CBC w/Diff No results for input(s): WBC, RBC, HGB, HCT, PLT, GRANS, LYMPH, EOS, HGBEXT, HCTEXT, PLTEXT in the last 72 hours.   Microbiology No results for input(s): CULT in the last 72 hours.       Imaging-    Results from Hospital Encounter encounter on 01/20/18   XR KNEE RT MIN 4 V    Narrative  Indication: 79 without injury    4 views right knee show no bony or soft tissue abnormality.      Impression IMPRESSION: Normal study.         Results from Hospital Encounter encounter on 04/03/18   CT ABD PELV W CONT    Narrative CT of the abdomen and pelvis     INDICATION:  RUQ pain.      COMPARISON: No relevant studies.    TECHNIQUE: CT of the abdomen and pelvis was performed with nonionic intravenous  contrast with coronal and sagittal reformatted images.    DICOM format image data is available to non-affiliated external healthcare  facilities or entities on a secure, media free, reciprocally searchable basis  with patient authorization for 12 months following the date of the study.    FINDINGS:    LOWER THORAX: There are bilateral lower lobe infiltrates, left greater than  right. Additional areas of subsegmental atelectasis.    ABDOMEN: There is no free air or free fluid. There is a slightly ill-defined 2.8  cm hypoenhancing lesion within the left medial hepatic lobe adjacent to the  falciform ligament for which MRI of the abdomen with and without contrast is  suggested. There is fatty infiltration of the liver. There are no radiopaque  gallstones. The adrenal glands are unremarkable. There is no hydronephrosis or  nephrolithiasis.    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 There is thickening of  the horizontal portion of the duodenum with surrounding free fluid and  inflammatory change. This is inferior to the uncinate process of the pancreas  and inflammation likely secondary to duodenal inflammation. Lipase normal on   chart review. No definite free air. No dilated loops of bowel.         PELVIS: There is no focal bladder wall thickening. The uterus is present.  Negative for aortic dissection.    OSSEOUS STRUCTURES: Acutely intact.      Impression IMPRESSION:    1.  Thickening of the horizontal portion of duodenum with surrounding  inflammatory changes. Findings likely reflect duodenitis. Superimposed ulcer not  excluded. While inflammation is near the pancreas, lipase is normal in chart  review.  2.  Fatty infiltration of the liver. 2.8 cm indeterminate lesion within the left  medial hepatic lobe. MRI with and without contrast recommended.  3.  Bibasilar infiltrates/atelectasis.         ---------------------------------------------------------------------------------------------------------------  I have independently examined the patient and reviewed all lab studies and imgaing as well as review of nursing notes and physican notes from the past 24 hours. The plan of care has been discussed with the patient/relative and all questions are answered.     Dragon medical dictation software was used for portions of this report. Unintended errors may occur.     Foye ClockAarti Cherica Heiden, MD  04/09/2018    [facility]  [phone]

## 2018-04-09 NOTE — Progress Notes (Signed)
Surg    Cholelithiasis- gallstones on ultrasound, ct and mri were neg. This may certainly be responsible for her sx but hard to blame the peri-duodenal fluid collection on gb dis. I favor course of Ab and repeat CT with plans for eventual lap chole, ioc and liver biopsy. If fluid collection has resolved no additional intervention needed. Surg will also offer oportunity for trans-gastric endoscopy to evaluate duodenum if necessary. With possibility of contained ulcer perforation it would be best to avoid surg and endoscopy in acute phase.      Feels better, tol po    Blood pressure 107/68, pulse 74, temperature 98.3 ??F (36.8 ??C), resp. rate 16, height 5' (1.524 m), weight 88.9 kg (196 lb), last menstrual period 04/01/2018, SpO2 100 %, unknown if currently breastfeeding.    Looks good  Chest clear  Heart rrr  abd soft, nontender  Ext nl    Ultrasound 1/26- cholelithiasis, fatty liver, 2x3 cm fluid in region of pancreas.

## 2018-04-10 LAB — CBC WITH AUTO DIFFERENTIAL
Basophils %: 0.2 % (ref 0–3)
Eosinophils %: 1.1 % (ref 0–5)
Hematocrit: 29.5 % — ABNORMAL LOW (ref 37.0–50.0)
Hemoglobin: 9.1 gm/dl — ABNORMAL LOW (ref 13.0–17.2)
Immature Granulocytes: 0.6 % (ref 0.0–3.0)
Lymphocytes %: 29.6 % (ref 28–48)
MCH: 30.3 pg (ref 25.4–34.6)
MCHC: 30.8 gm/dl (ref 30.0–36.0)
MCV: 98.3 fL — ABNORMAL HIGH (ref 80.0–98.0)
MPV: 11.8 fL — ABNORMAL HIGH (ref 6.0–10.0)
Monocytes %: 13.8 % — ABNORMAL HIGH (ref 1–13)
Neutrophils %: 54.7 % (ref 34–64)
Nucleated RBCs: 1 — ABNORMAL HIGH (ref 0–0)
Platelets: 376 10*3/uL (ref 140–450)
RBC: 3 M/uL — ABNORMAL LOW (ref 3.60–5.20)
RDW-SD: 59 — ABNORMAL HIGH (ref 36.4–46.3)
WBC: 5.4 10*3/uL (ref 4.0–11.0)

## 2018-04-10 LAB — COMPREHENSIVE METABOLIC PANEL
ALT: 43 U/L (ref 12–78)
AST: 44 U/L — ABNORMAL HIGH (ref 15–37)
Albumin: 2.5 gm/dl — ABNORMAL LOW (ref 3.4–5.0)
Alkaline Phosphatase: 188 U/L — ABNORMAL HIGH (ref 45–117)
Anion Gap: 4 mmol/L — ABNORMAL LOW (ref 5–15)
BUN: 4 mg/dl — ABNORMAL LOW (ref 7–25)
CO2: 28 mEq/L (ref 21–32)
Calcium: 8.6 mg/dl (ref 8.5–10.1)
Chloride: 107 mEq/L (ref 98–107)
Creatinine: 0.7 mg/dl (ref 0.6–1.3)
EGFR IF NonAfrican American: >60
GFR African American: >60
Glucose: 76 mg/dl (ref 74–106)
Potassium: 3.9 mEq/L (ref 3.5–5.1)
Sodium: 139 mEq/L (ref 136–145)
Total Bilirubin: 0.5 mg/dl (ref 0.2–1.0)
Total Protein: 7.1 gm/dl (ref 6.4–8.2)

## 2018-04-10 LAB — H PYLORI AB, IGA
H PYLORI AB IGA,HPYA: <9
H.Pylori Ab,IgA: <9

## 2018-04-10 LAB — METABOLIC PANEL, COMPREHENSIVE
ALT (SGPT): 43 U/L (ref 12–78)
AST (SGOT): 44 U/L — ABNORMAL HIGH (ref 15–37)
Albumin: 2.5 gm/dl — ABNORMAL LOW (ref 3.4–5.0)
Alk. phosphatase: 188 U/L — ABNORMAL HIGH (ref 45–117)
Anion gap: 4 mmol/L — ABNORMAL LOW (ref 5–15)
BUN: 4 mg/dl — ABNORMAL LOW (ref 7–25)
Bilirubin, total: 0.5 mg/dl (ref 0.2–1.0)
CO2: 28 mEq/L (ref 21–32)
Calcium: 8.6 mg/dl (ref 8.5–10.1)
Chloride: 107 mEq/L (ref 98–107)
Creatinine: 0.7 mg/dl (ref 0.6–1.3)
GFR est AA: >60
GFR est non-AA: >60
Glucose: 76 mg/dl (ref 74–106)
Potassium: 3.9 mEq/L (ref 3.5–5.1)
Protein, total: 7.1 gm/dl (ref 6.4–8.2)
Sodium: 139 mEq/L (ref 136–145)

## 2018-04-10 LAB — CBC WITH AUTOMATED DIFF
BASOPHILS: 0.2 % (ref 0–3)
EOSINOPHILS: 1.1 % (ref 0–5)
HCT: 29.5 % — ABNORMAL LOW (ref 37.0–50.0)
HGB: 9.1 gm/dl — ABNORMAL LOW (ref 13.0–17.2)
IMMATURE GRANULOCYTES: 0.6 % (ref 0.0–3.0)
LYMPHOCYTES: 29.6 % (ref 28–48)
MCH: 30.3 pg (ref 25.4–34.6)
MCHC: 30.8 gm/dl (ref 30.0–36.0)
MCV: 98.3 fL — ABNORMAL HIGH (ref 80.0–98.0)
MONOCYTES: 13.8 % — ABNORMAL HIGH (ref 1–13)
MPV: 11.8 fL — ABNORMAL HIGH (ref 6.0–10.0)
NEUTROPHILS: 54.7 % (ref 34–64)
NRBC: 1 — ABNORMAL HIGH (ref 0–0)
PLATELET: 376 10*3/uL (ref 140–450)
RBC: 3 M/uL — ABNORMAL LOW (ref 3.60–5.20)
RDW-SD: 59 — ABNORMAL HIGH (ref 36.4–46.3)
WBC: 5.4 10*3/uL (ref 4.0–11.0)

## 2018-04-10 MED ORDER — BISACODYL 5 MG TAB, DELAYED RELEASE
5 mg | Freq: Every day | ORAL | Status: DC | PRN
Start: 2018-04-10 — End: 2018-04-12
  Administered 2018-04-11: 02:00:00 via ORAL

## 2018-04-10 MED FILL — OXYCODONE 5 MG TAB: 5 mg | ORAL | Qty: 1

## 2018-04-10 MED FILL — CHILDS/IRON CHEWABLE TABLET: ORAL | Qty: 1

## 2018-04-10 MED FILL — ONDANSETRON (PF) 4 MG/2 ML INJECTION: 4 mg/2 mL | INTRAMUSCULAR | Qty: 2

## 2018-04-10 MED FILL — HYDROMORPHONE 1 MG/ML INJECTION SOLUTION: 1 mg/mL | INTRAMUSCULAR | Qty: 1

## 2018-04-10 MED FILL — POLYETHYLENE GLYCOL 3350 17 GRAM (100 %) ORAL POWDER PACKET: 17 gram | ORAL | Qty: 1

## 2018-04-10 MED FILL — METRONIDAZOLE IN SODIUM CHLORIDE (ISO-OSM) 500 MG/100 ML IV PIGGY BACK: 500 mg/100 mL | INTRAVENOUS | Qty: 100

## 2018-04-10 MED FILL — FERROUS SULFATE 325 MG (65 MG ELEMENTAL IRON) TAB: 325 mg (65 mg iron) | ORAL | Qty: 1

## 2018-04-10 MED FILL — CIPROFLOXACIN IN D5W 400 MG/200 ML IV PIGGY BACK: 400 mg/200 mL | INTRAVENOUS | Qty: 200

## 2018-04-10 MED FILL — BISACODYL 10 MG RECTAL SUPPOSITORY: 10 mg | RECTAL | Qty: 1

## 2018-04-10 MED FILL — PANTOPRAZOLE 40 MG IV SOLR: 40 mg | INTRAVENOUS | Qty: 40

## 2018-04-10 MED FILL — THIAMINE 100 MG/ML INJECTION: 100 mg/mL | INTRAMUSCULAR | Qty: 2

## 2018-04-10 MED FILL — DOCUSATE SODIUM 100 MG CAP: 100 mg | ORAL | Qty: 1

## 2018-04-10 MED FILL — BD POSIFLUSH NORMAL SALINE 0.9 % INJECTION SYRINGE: INTRAMUSCULAR | Qty: 10

## 2018-04-10 NOTE — Progress Notes (Signed)
Hospitalist Progress Note         [facility]    Daily Progress Note: 04/10/2018    Assessment/Plan:     Abdominal fluid collection: suspect abscess from contained duodenal ulcer/perforation:  - on IV cipro/flagyl. Consider PO when better tolerating PO  -MRI Abd reports enhancing fluid collection adjacent to the pancreas but concern for developing abscess from contained, ruptured ulcer vs pseudocyst.  -normal Lipase.  - Sx suggests Abx course and eventual Lap chole, IOC, and Liver Bx, if fluid collection not resolved. Also trans gastric endoscopy to evaluate the duodenum.   -H.pylori IgA negative, IgG pending  -GI suggests PPI daily, and follow with Dr. Delma Freeze as outpt once cleared by Sx, but has nothing else to offer currently.  -Eventual CT of abd/pelvis after completion of abx.  ??  ??  ABD pain-RUQ , Epigastric pain radiating to the back,:  -Multifactorial, with concerns Gastritis and duodenitis, Possible anastomotic ulcer or contained ulcer as above.  -Also narcotic induced constipation,   -Patient reports tolerating liquids but poor food intake. No BM since 1/18  -On scheduled miralax, colace, with prn dulcoloax, bentyl  -Pt trying to decrease use of Hydromorphone and Oxy-IR, less [place] of hydromorphone today, compared to yesterday.  -Will add IV APAP for complaints of costochondritis.  -Duodenal Ulcer makes NSAIDs, Methyl-naltrexone unattractive options.  -normal lipase, no pancreatitis, see #1     Focal fatty infiltration within the left medial hepatic lobe - 2.8 cm   -seen on MRI.    H/o Roux-en-Y gastric bypass    Chronic iron anemia - IDA,  iron panel showed iron deficiency, Stable, Po iron added   ??  Constipation: opioid induced.  -see above.    Asymptomatic bacteruria:  -No plans for treatment from ID standpoint    Costochondritis:  -Add APAP for reports of rib pain with deep inspiration.  -Add IV APAP As above.    Infiltrated L UE IV site on 1/28  -treat conservatively.    PPX: SCDs, consider Pharm  PPX if remains hospitalized.    Dispo:  -Limited PO intake, likely due to Abd pain, which at this point narcotics likely contributing to pain from underlying pathology  -consider d/c when able to tolerate PO intake and Oral Abx.    Subjective:     Pt reports continued Abd pain with bloating, nausea. Passing some flatus but no BM since 1/18. Tolerating liquids. Reports pain with costochondritis new today.      General ROS: (-) for fever, chills  Respiratory ROS: (+) for costochondritis/pleuritic pain, (-) for cough, shortness of breath,  Cardiovascular ROS: (-) for chest pain, palpitations  Gastrointestinal ROS: (+) for abdominal pain, nausea, constipation    Objective:   Physical Exam:     Visit Vitals  BP 126/83 (BP 1 Location: Left arm, BP Patient Position: Supine)   Pulse 84   Temp 98.2 ??F (36.8 ??C)   Resp 16   Ht 5' (1.524 m)   Wt 88.9 kg (196 lb)   LMP 04/01/2018 (Exact Date)   SpO2 92%   BMI 38.28 kg/m??      O2 Device: Room air    Temp (24hrs), Avg:98 ??F (36.7 ??C), Min:97.8 ??F (36.6 ??C), Max:98.2 ??F (36.8 ??C)    No intake/output data recorded.   01/27 0701 - 01/28 1900  In: 5416.3 [P.O.:2160; I.V.:3256.3]  Out: 1400 [Urine:1400]    General: Alert, Obese AAF,  pleasant to conversation, appears mildly uncomfortable.  CV: Regular rate and  rhythm, no murmurs, rubs, gallops  Pulm: Lungs clear to auscultation bilaterally, no focal wheezes, crackles, rhonchi  GI: Soft, Mild diffuse tenderness, somewhat compressible, rare but present bowel sounds  Extremity: No clubbing, cyanosis, no pitting lower leg edema, capillary refill time intact to distal fingertips.  Induration at L UE at former IV site.  Skin: Warm, dry, tattoos.    Data Review:       24 Hour Results:  Recent Results (from the past 24 hour(s))   CBC WITH AUTOMATED DIFF    Collection Time: 04/10/18  2:18 AM   Result Value Ref Range    WBC 5.4 4.0 - 11.0 1000/mm3    RBC 3.00 (L) 3.60 - 5.20 M/uL    HGB 9.1 (L) 13.0 - 17.2 gm/dl    HCT 29.5 (L) 37.0 - 50.0 %     MCV 98.3 (H) 80.0 - 98.0 fL    MCH 30.3 25.4 - 34.6 pg    MCHC 30.8 30.0 - 36.0 gm/dl    PLATELET 376 140 - 450 1000/mm3    MPV 11.8 (H) 6.0 - 10.0 fL    RDW-SD 59.0 (H) 36.4 - 46.3      NRBC 1 (H) 0 - 0      IMMATURE GRANULOCYTES 0.6 0.0 - 3.0 %    NEUTROPHILS 54.7 34 - 64 %    LYMPHOCYTES 29.6 28 - 48 %    MONOCYTES 13.8 (H) 1 - 13 %    EOSINOPHILS 1.1 0 - 5 %    BASOPHILS 0.2 0 - 3 %   METABOLIC PANEL, COMPREHENSIVE    Collection Time: 04/10/18  2:18 AM   Result Value Ref Range    Sodium 139 136 - 145 mEq/L    Potassium 3.9 3.5 - 5.1 mEq/L    Chloride 107 98 - 107 mEq/L    CO2 28 21 - 32 mEq/L    Glucose 76 74 - 106 mg/dl    BUN 4 (L) 7 - 25 mg/dl    Creatinine 0.7 0.6 - 1.3 mg/dl    GFR est AA >60.0      GFR est non-AA >60      Calcium 8.6 8.5 - 10.1 mg/dl    AST (SGOT) 44 (H) 15 - 37 U/L    ALT (SGPT) 43 12 - 78 U/L    Alk. phosphatase 188 (H) 45 - 117 U/L    Bilirubin, total 0.5 0.2 - 1.0 mg/dl    Protein, total 7.1 6.4 - 8.2 gm/dl    Albumin 2.5 (L) 3.4 - 5.0 gm/dl    Anion gap 4 (L) 5 - 15 mmol/L       Problem List:  Problem List as of 04/10/2018 Date Reviewed: 2010/12/19          Codes Class Noted - Resolved    Gastritis ICD-10-CM: K29.70  ICD-9-CM: 535.50  04/04/2018 - Present        Previous cesarean delivery, antepartum condition or complication [id number]: G95.621  ICD-9-CM: 654.23  December 19, 2010 - Present    Overview Signed Dec 19, 2010  4:20 PM by Fredirick Lathe T     Three prior cesarean deliveries             Bariatric surgery status complicating pregnancy, childbirth, or the puerperium, antepartum condition or complication [id number]-[id number]: I69.629  ICD-9-CM: 649.23  12/19/10 - Present               Medications reviewed  Current Facility-Administered Medications   Medication Dose Route  Frequency   ??? bisacodyL (DULCOLAX) tablet 10 mg  10 mg Oral DAILY PRN   ??? acetaminophen (OFIRMEV) infusion 1,000 mg  1,000 mg IntraVENous Q8H PRN   ??? artificial tears (dextran 70-hypromellose) (NATURAL BALANCE) 0.1-0.3 % ophthalmic  solution 1 Drop  1 Drop Left Eye QID PRN   ??? diphenhydrAMINE (BENADRYL) capsule 25 mg  25 mg Oral Q6H PRN   ??? dextrose 5% - 0.45% NaCl with KCl 20 mEq/L infusion  75 mL/hr IntraVENous CONTINUOUS   ??? ciprofloxacin (CIPRO) 400 mg in D5W IVPB (premix)  400 mg IntraVENous Q12H   ??? metroNIDAZOLE (FLAGYL) IVPB premix 500 mg  500 mg IntraVENous Q8H   ??? ondansetron (ZOFRAN) injection 4 mg  4 mg IntraVENous Q4H PRN   ??? oxyCODONE IR (ROXICODONE) tablet 5 mg  5 mg Oral Q4H PRN   ??? docusate sodium (COLACE) capsule 100 mg  100 mg Oral BID   ??? polyethylene glycol (MIRALAX) packet 17 g  17 g Oral BID   ??? bisacodyL (DULCOLAX) suppository 10 mg  10 mg Rectal DAILY PRN   ??? naloxone (NARCAN) injection 0.4 mg  0.4 mg IntraVENous EVERY 2 MINUTES AS NEEDED   ??? pantoprazole (PROTONIX) 40 mg in 0.9% sodium chloride 10 mL injection  40 mg IntraVENous Q24H   ??? multivitamin with iron (FLINTSTONES) chewable tablet 1 Tab  1 Tab Oral BID   ??? ferrous sulfate tablet 325 mg  1 Tab Oral DAILY WITH BREAKFAST   ??? dicyclomine (BENTYL) capsule 10 mg  10 mg Oral Q6H PRN   ??? acetaminophen (TYLENOL) tablet 650 mg  650 mg Oral Q6H PRN   ??? sodium chloride (NS) flush 5-10 mL  5-10 mL IntraVENous Q8H   ??? sodium chloride (NS) flush 5-10 mL  5-10 mL IntraVENous PRN   ??? naloxone (NARCAN) injection 0.1 mg  0.1 mg IntraVENous PRN   ??? ondansetron (ZOFRAN) injection 4 mg  4 mg IntraVENous Q6H PRN   ??? HYDROmorphone (DILAUDID) injection 0.5 mg  0.5 mg IntraVENous Q4H PRN        Care Plan discussed with: Patient/Family and Nurse    Total time spent with patient: 35 minutes.    Marzella Schlein, MD  April 10, 2018  19:38

## 2018-04-10 NOTE — Progress Notes (Signed)
 NUTRITION RECOMMENDATIONS:   Lifted CCHO diet restriction - pt with no hx of elevated BG/DM per report and per lab values   Requesting to try Ensure Enlive (350 kcals, 20 gm protein) and Ensure Pudding (170 kcals, 4 gm protein) - added once daily for each  Rec continue thiamine, MVI with Fe/FeSo4 supplementation, consider adding vit B12 and Ca/vit D as well     NUTRITION INITIAL EVALUATION    NUTRITION ASSESSMENT:       Reason for assessment: LOS    Admitting diagnosis: Gastritis [K29.70]  Gastritis [K29.70]     PMH:   Past Medical History:   Diagnosis Date   . Ill-defined condition     bronchitis   . Pneumonia        Code Status: Full Code      Anthropometrics:  Height:   Ht Readings from Last 3 Encounters:   04/03/18 5' (1.524 m)   04/06/18 5' 1 (1.549 m)   03/31/18 5' (1.524 m)       Weight:   Wt Readings from Last 3 Encounters:   04/03/18 88.9 kg (196 lb)   04/06/18 80.9 kg (178 lb 5.6 oz)   03/31/18 88.9 kg (196 lb)       Weight source:         Unknown               IBW: 45.45 kg       % IBW: 196%      BMI: Body mass index is 38.28 kg/m.     UBW: 188-196# per patient      Wt change: stable          []  significant       []  not significant         []  intended         []  not intended Diet and intake history:   Current diet order: DIET NUTRITIONAL SUPPLEMENTS Lunch; Ensure Enlive   DIET NUTRITIONAL SUPPLEMENTS Dinner; Ensure Pudding   DIET REGULAR     Food allergies: none      Diet/intake history: po intake decreased x 1 month 2/2 GI symptoms. Is pescatarian - avoids meat/poultry/pork, etc. Eggs okay. Cheese/yogurt okay. Avoids milk 2/2 GI upset - thinks this is due to lactose. Pt willing to try Ensure Enlive (is suitable for lactose intolerance) and is willing to try Ensure Pudding.            []  <50% intake x >5 days        []  <50% intake x >1 month        []  <75% intake x >7 days         [x]  <75% intake x 1 month         []  <75% intake x 3 months     Current appetite/PO intake:         []    N/A-  NPO        []    Very poor (<25% of meals)        []    Poor (<50% of meals)        [x]    Fair (50-75% of meals)         []    Good (>75% of meals)  % Diet Eaten  Avg: 63.8 %  Min: 0 %  Max: 100 %      Assessment of current MNT: Reg diet appropriate, adequate to meet nutrition needs if po >75% with meals. Ensure  Enlive and Ensure Pudding to increase calorie/pro intake opportunity      Cultural, religious, and ethnic food preferences identified: none       Physical Assessment:   GI symptoms:   Adm with duodenal ulcer perforation mimicking acute pancreatitis, no MR e/o gallstones, RUQ US   Gallstone and subhepatic/peripancreatic fluid collection; hx RNYGB   Last Bowel Movement Date: 04/04/18     Abdominal Assessment: Semi-soft  Bowel Sounds: Hypoactive      Chewing/swallowing issues:   None      Skin integrity:   Intact         Muscle wasting:   []  unable to assess at this time   [x]  none  []  mild:  []  moderate:  []  severe:     Fat wasting:  []  unable to assess at this time   [x]  none  []  mild:  []  moderate:  []  severe:     Fluid accumulation:   Edema  LLE: Trace  RLE: Trace      Mental status:   Alert/oriented              Functional status PTA:  Independent in ADLs/IADLs    Intake and output:    Intake/Output Summary (Last 24 hours) at 04/10/2018 0948  Last data filed at 04/10/2018 9394  Gross per 24 hour   Intake 3916.25 ml   Output 800 ml   Net 3116.25 ml       Estimated daily nutrition intake needs:   1200 - 1400 kcals (25-30 kcals/kg IBW)     90 - 110 g protein (1-1.2 gm/kg)     2250 mL fluid (25 mL/kg BW)     Living situation: []  alone, little/no support    [x]  alone, family nearby/supportive      []  with family/caregiver     []  in NH/SNF     []  homeless    Current pertinent medications: cipro, colace, FeSo4, flagyl, MVI with Fe, protonix, miralax, thiamine, D5/0.45/KCl @ 75 ml/hr     Pertinent labs: 1/28 - pert labs reviewed. Rec check Mg/Phos     Does patient meet ASPEN/AND criteria for malnutrition  diagnosis: not at this time     NUTRITION DIAGNOSIS:     1. Altered GI function related to duodenal ulcer, hx RNYGB as evidenced by abd pain, nausea, med/sx hx, po <75% x 1 month    2. Inadequate oral intake related to duodenal abscess/ulcer as evidenced by po <75% x 1 month.    3. 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      NUTRITION INTERVENTION:     Recommended diet:   Lifted CCHO diet restriction - pt with no hx of elevated BG/DM per report and per lab values   Requesting to try Ensure Enlive (350 kcals, 20 gm protein) and Ensure Pudding (170 kcals, 4 gm protein) - added once daily for each  Rec continue thiamine, MVI with Fe/FeSo4 supplementation, consider adding vit B12 and Ca/vit D as well     NUTRITION MONITORING AND EVALUATION:     Nutrition level of care:  []  low       [x]  moderate      []  high    Nutrition monitoring: PO intake, diet tolerance and compliance, wt, BS, CMP, hydration, medical changes    Nutrition goals: PO >75% with meals/supplements, wt stable through LOS, nutrition related  lab values WNL     Anticipated Discharge Placement: Home    Levorn Berber, RD  04/10/18

## 2018-04-10 NOTE — Progress Notes (Signed)
Problem: Pain  Goal: *Control of Pain  Outcome: Progressing Towards Goal

## 2018-04-10 NOTE — Progress Notes (Signed)
Progress  Notes by Foye Clock, MD at 04/10/18 [phone]                Author: Foye Clock, MD  Service: Infectious Disease  Author Type: Physician       Filed: 04/10/18 1009  Date of Service: 04/10/18 0747  Status: Signed          Editor: Foye Clock, MD (Physician)                                                                                INFECTIOUS DISEASE FOLLOW UP NOTE :-       Admit Date: 04/03/2018      Date of admission: 04/03/2018   ??   Date of consult: April 09, 2018   ??   ??     ABX:     ??      Current abx  Prior abx      Cipro/flagyl 1/26-2   Zosyn 1/25-1     ??     ASSESSMENT:      ??     Abdominal fluid collection-concerning for abscess secondary to duodenal ulcer perforation   -CT 1/21 thickening of the horizontal portion of duodenum with surrounding inflammatory changes,?  Pancreatitis   -MRI 1/24 s/o lesion within the liver c/w focal fatty infiltration, prominent peripherally enhanced fluid adjacent to pancreatic head/uncinate process with extensive surrounding inflammatory changes?  Developing abscess-likely contained from ruptured  ulcer   -Status post EGD with enteroscopy 1/24 showing normal mucosa but unable to reach JJ anastomosis   -Ultrasound 1/26 with subhepatic fluid collection, cholelithiasis, fatty liver     Right upper quadrant pain   -Suspect secondary to above   -Normal lipase-doubt pancreatitis   -Significantly elevated AST>> ALT but denies alcohol abuse   -No evidence of acute cholecystitis on ultrasound     History of gastric bypass in 2012     Constipation     Obesity with BMI of 38     Anemia     History of sleep apnea     Stress urinary incontinence     ??   ??   ??     RECOMMENDATIONS:     ??   This is complicated patient with history of previous gastric bypass surgery and coming with abdominal pain and finding of fluid collection adjacent to duodenum   ??   -concern for abscess at the junction of third and fourth portions of her duodenum with peripancreatic inflammation- ?  sec be perforated duodenal ulcer though likely contained now.  Ultrasound showed gallstones but no concern for acute cholecystitis.  Limited EGD showed normal mucosa   -Continue on IV Cipro and Flagyl and switch to p.o. when tolerating well   - Monitor WBc, remains afebrile   -H. pylori serology negative Patient is already on PPIs and antibiotics hence other antigen testing will not be useful at this time   -We will suggest to repeat imaging next week and if still with significant fluid collection, she will benefit from either IR guided aspiration or surgical intervention   -Remains with constipation-suspect secondary to pain medications   -Monitor labs and cultures   -Will  not treat bacteria in her urine as asymptomatic   -Multivitamins per others   -D/w Dr. Jerl Minaamarato                     MICROBIOLOGY:     ??   1/18     urine culture 20 came mixed flora, 30 K E. coli R quinolones, Bactrim   ??     LINES AND CATHETERS:     PIV           SUBJECTIVE :        Interval notes reviewed.  Afebrile.  Sitting in a chair.  States still nauseous but no vomiting.  She had couple of bites of food but still no appetite.  Discussed with her will plan to switch antibiotics to oral once she started having good p.o. intake.   Plan will be to repeat imaging in 1 week.        OBJECTIVE        Visit Vitals      BP  110/72 (BP 1 Location: Right arm, BP Patient Position: Sitting)     Pulse  72     Temp  97.8 ??F (36.6 ??C)     Resp  18     Ht  5' (1.524 m)     Wt  88.9 kg (196 lb)     LMP  04/01/2018 (Exact Date)     SpO2  99%        BMI  38.28 kg/m??           Temp (24hrs), Avg:98.2 ??F (36.8 ??C), Min:97.8 ??F (36.6 ??C), Max:98.4 ??F (36.9 ??C)      General: Well-developed, 33 y.o. year-old, female, in no acute distress   HEENT: Normocephalic, anicteric sclerae, Pupils equal, round reactive to light   Neck: Supple, no lymphadenopathy, masses or thyromegaly   Chest: Symmetrical expansion   Lungs: Clear to auscultation bilaterally, no dullness    Heart: Regular rhythm, no murmur, no rub or gallop, No JVD   Abdomen: Soft, tender in epigastric area, no organomegaly, BS+   Musculoskeletal: Normal strength/tone. No edema. No clubbing or cyanosis   CNS: AAOx3. Cranial nerves II-XII intact. Grossly normal.No NR   SKIN: No skin lesion or rash. Dry, warm, intact             MEDICATIONS:          Current Facility-Administered Medications          Medication  Dose  Route  Frequency           ?  artificial tears (dextran 70-hypromellose) (NATURAL BALANCE) 0.1-0.3 % ophthalmic solution 1 Drop   1 Drop  Left Eye  QID PRN     ?  diphenhydrAMINE (BENADRYL) capsule 25 mg   25 mg  Oral  Q6H PRN     ?  dextrose 5% - 0.45% NaCl with KCl 20 mEq/L infusion   75 mL/hr  IntraVENous  CONTINUOUS     ?  ciprofloxacin (CIPRO) 400 mg in D5W IVPB (premix)   400 mg  IntraVENous  Q12H     ?  metroNIDAZOLE (FLAGYL) IVPB premix 500 mg   500 mg  IntraVENous  Q8H     ?  ondansetron (ZOFRAN) injection 4 mg   4 mg  IntraVENous  Q4H PRN     ?  oxyCODONE IR (ROXICODONE) tablet 5 mg   5 mg  Oral  Q4H PRN     ?  docusate sodium (COLACE) capsule 100 mg   100 mg  Oral  BID     ?  polyethylene glycol (MIRALAX) packet 17 g   17 g  Oral  BID     ?  bisacodyL (DULCOLAX) suppository 10 mg   10 mg  Rectal  DAILY PRN     ?  naloxone (NARCAN) injection 0.4 mg   0.4 mg  IntraVENous  EVERY 2 MINUTES AS NEEDED     ?  pantoprazole (PROTONIX) 40 mg in 0.9% sodium chloride 10 mL injection   40 mg  IntraVENous  Q24H     ?  thiamine (B-1) injection 100 mg   100 mg  IntraMUSCular  DAILY     ?  multivitamin with iron (FLINTSTONES) chewable tablet 1 Tab   1 Tab  Oral  BID     ?  ferrous sulfate tablet 325 mg   1 Tab  Oral  DAILY WITH BREAKFAST     ?  dicyclomine (BENTYL) capsule 10 mg   10 mg  Oral  Q6H PRN     ?  acetaminophen (TYLENOL) tablet 650 mg   650 mg  Oral  Q6H PRN     ?  sodium chloride (NS) flush 5-10 mL   5-10 mL  IntraVENous  Q8H     ?  sodium chloride (NS) flush 5-10 mL   5-10 mL  IntraVENous  PRN     ?   naloxone (NARCAN) injection 0.1 mg   0.1 mg  IntraVENous  PRN     ?  ondansetron (ZOFRAN) injection 4 mg   4 mg  IntraVENous  Q6H PRN           ?  HYDROmorphone (DILAUDID) injection 0.5 mg   0.5 mg  IntraVENous  Q4H PRN              Labs:  Results:        Chemistry  Recent Labs         04/10/18   0218  04/08/18   0309  04/07/18   1231      GLU  76  88  95      NA  139  138  136      K  3.9  3.6  4.0      CL  107  104  105      CO2  28  27  26       BUN  4*  2*  2*      CREA  0.7  0.7  0.5*      CA  8.6  8.4*  8.4*      AGAP  4*  7  6      AP  188*  228*  135*      TP  7.1  6.8  6.3*      ALB  2.5*  2.3*  2.4*                 CBC w/Diff  Recent Labs         04/10/18   0218      WBC  5.4      RBC  3.00*      HGB  9.1*      HCT  29.5*      PLT  376      GRANS  54.7      LYMPH  29.6      EOS  1.1  RADIOLOGY :               Imaging     Results from Hospital Encounter encounter on 01/20/18     XR KNEE RT MIN 4 V           Narrative  Indication: 79 without injury      4 views right knee show no bony or soft tissue abnormality.              Impression  IMPRESSION: Normal study.                Results from Hospital Encounter encounter on 04/03/18     CT ABD PELV W CONT           Narrative  CT of the abdomen and pelvis       INDICATION:  RUQ pain.       COMPARISON: No relevant studies.      TECHNIQUE: CT of the abdomen and pelvis was performed with nonionic intravenous   contrast with coronal and sagittal reformatted images.      DICOM format image data is available to non-affiliated external healthcare   facilities or entities on a secure, media free, reciprocally searchable basis   with patient authorization for 12 months following the date of the study.      FINDINGS:      LOWER THORAX: There are bilateral lower lobe infiltrates, left greater than   right. Additional areas of subsegmental atelectasis.      ABDOMEN: There is no free air or free fluid. There is a slightly ill-defined 2.8   cm hypoenhancing  lesion within the left medial hepatic lobe adjacent to the   falciform ligament for which MRI of the abdomen with and without contrast is   suggested. There is fatty infiltration of the liver. There are no radiopaque   gallstones. The adrenal glands are unremarkable. There is no hydronephrosis or   nephrolithiasis.      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 There is thickening of   the horizontal portion of the duodenum with surrounding free fluid and   inflammatory change. This is inferior to the uncinate process of the pancreas   and inflammation likely secondary to duodenal inflammation. Lipase normal on   chart review. No definite free air. No dilated loops of bowel.           PELVIS: There is no focal bladder wall thickening. The uterus is present.   Negative for aortic dissection.      OSSEOUS STRUCTURES: Acutely intact.              Impression  IMPRESSION:      1.  Thickening of the horizontal portion of duodenum with surrounding   inflammatory changes. Findings likely reflect duodenitis. Superimposed ulcer not   excluded. While inflammation is near the pancreas, lipase is normal in chart   review.   2.  Fatty infiltration of the liver. 2.8 cm indeterminate lesion within the left   medial hepatic lobe. MRI with and without contrast recommended.   3.  Bibasilar infiltrates/atelectasis.           I have independently examined the patient and reviewed lab studies and imgaing as well as review of nursing notes and physican notes from the past 24 hours.      Dragon medical dictation software was used for portions of this report. Unintended errors may occur.       Foye ClockAarti Klohe Lovering, MD  April 10, 2018      [facility]   [phone]

## 2018-04-10 NOTE — Progress Notes (Signed)
Will cont to monitor client for stabilization if at any point client has any dificulties will address for dispo.Marland Kitchen

## 2018-04-10 NOTE — Progress Notes (Signed)
Will cont to monitor client for stabilization if at any point client has any dificulties will address for dispo..

## 2018-04-10 NOTE — Progress Notes (Signed)
Hospitalist Progress Note         [facility]    Daily Progress Note: 04/10/2018    Assessment/Plan:     Abdominal fluid collection: suspect abscess from contained duodenal ulcer/perforation:  - on IV cipro/flagyl. Consider PO when better tolerating PO  -MRI Abd reports enhancing fluid collection adjacent to the pancreas but concern for developing abscess from contained, ruptured ulcer vs pseudocyst.  -normal Lipase.  - Sx suggests Abx course and eventual Lap chole, IOC, and Liver Bx, if fluid collection not resolved. Also trans gastric endoscopy to evaluate the duodenum.   -H.pylori IgA negative, IgG pending  -GI suggests PPI daily, and follow with Dr. Delma Freeze as outpt once cleared by Sx, but has nothing else to offer currently.  -Eventual CT of abd/pelvis after completion of abx.  ??  ??  ABD pain-RUQ , Epigastric pain radiating to the back,:  -Multifactorial, with concerns Gastritis and duodenitis, Possible anastomotic ulcer or contained ulcer as above.  -Also narcotic induced constipation,   -Patient reports tolerating liquids but poor food intake. No BM since 1/18  -On scheduled miralax, colace, with prn dulcoloax, bentyl  -Pt trying to decrease use of Hydromorphone and Oxy-IR, less [place] of hydromorphone today, compared to yesterday.  -Will add IV APAP for complaints of costochondritis.  -Duodenal Ulcer makes NSAIDs, Methyl-naltrexone unattractive options.  -normal lipase, no pancreatitis, see #1     Focal fatty infiltration within the left medial hepatic lobe - 2.8 cm   -seen on MRI.    H/o Roux-en-Y gastric bypass    Chronic iron anemia - IDA,  iron panel showed iron deficiency, Stable, Po iron added   ??  Constipation: opioid induced.  -see above.    Asymptomatic bacteruria:  -No plans for treatment from ID standpoint    Costochondritis:  -Add APAP for reports of rib pain with deep inspiration.  -Add IV APAP As above.    Infiltrated L UE IV site on 1/28  -treat conservatively.     PPX: SCDs, consider Pharm PPX if remains hospitalized.    Dispo:  -Limited PO intake, likely due to Abd pain, which at this point narcotics likely contributing to pain from underlying pathology  -consider d/c when able to tolerate PO intake and Oral Abx.    Subjective:     Pt reports continued Abd pain with bloating, nausea. Passing some flatus but no BM since 1/18. Tolerating liquids. Reports pain with costochondritis new today.      General ROS: (-) for fever, chills  Respiratory ROS: (+) for costochondritis/pleuritic pain, (-) for cough, shortness of breath,  Cardiovascular ROS: (-) for chest pain, palpitations  Gastrointestinal ROS: (+) for abdominal pain, nausea, constipation    Objective:   Physical Exam:     Visit Vitals  BP 126/83 (BP 1 Location: Left arm, BP Patient Position: Supine)   Pulse 84   Temp 98.2 ??F (36.8 ??C)   Resp 16   Ht 5' (1.524 m)   Wt 88.9 kg (196 lb)   LMP 04/01/2018 (Exact Date)   SpO2 92%   BMI 38.28 kg/m??      O2 Device: Room air    Temp (24hrs), Avg:98 ??F (36.7 ??C), Min:97.8 ??F (36.6 ??C), Max:98.2 ??F (36.8 ??C)    No intake/output data recorded.   01/27 0701 - 01/28 1900  In: 5416.3 [P.O.:2160; I.V.:3256.3]  Out: 1400 [Urine:1400]    General: Alert, Obese AAF,  pleasant to conversation, appears mildly uncomfortable.  CV: Regular rate and  rhythm, no murmurs, rubs, gallops  Pulm: Lungs clear to auscultation bilaterally, no focal wheezes, crackles, rhonchi  GI: Soft, Mild diffuse tenderness, somewhat compressible, rare but present bowel sounds  Extremity: No clubbing, cyanosis, no pitting lower leg edema, capillary refill time intact to distal fingertips.  Induration at L UE at former IV site.  Skin: Warm, dry, tattoos.    Data Review:       24 Hour Results:  Recent Results (from the past 24 hour(s))   CBC WITH AUTOMATED DIFF    Collection Time: 04/10/18  2:18 AM   Result Value Ref Range    WBC 5.4 4.0 - 11.0 1000/mm3    RBC 3.00 (L) 3.60 - 5.20 M/uL    HGB 9.1 (L) 13.0 - 17.2 gm/dl     HCT 29.5 (L) 37.0 - 50.0 %    MCV 98.3 (H) 80.0 - 98.0 fL    MCH 30.3 25.4 - 34.6 pg    MCHC 30.8 30.0 - 36.0 gm/dl    PLATELET 376 140 - 450 1000/mm3    MPV 11.8 (H) 6.0 - 10.0 fL    RDW-SD 59.0 (H) 36.4 - 46.3      NRBC 1 (H) 0 - 0      IMMATURE GRANULOCYTES 0.6 0.0 - 3.0 %    NEUTROPHILS 54.7 34 - 64 %    LYMPHOCYTES 29.6 28 - 48 %    MONOCYTES 13.8 (H) 1 - 13 %    EOSINOPHILS 1.1 0 - 5 %    BASOPHILS 0.2 0 - 3 %   METABOLIC PANEL, COMPREHENSIVE    Collection Time: 04/10/18  2:18 AM   Result Value Ref Range    Sodium 139 136 - 145 mEq/L    Potassium 3.9 3.5 - 5.1 mEq/L    Chloride 107 98 - 107 mEq/L    CO2 28 21 - 32 mEq/L    Glucose 76 74 - 106 mg/dl    BUN 4 (L) 7 - 25 mg/dl    Creatinine 0.7 0.6 - 1.3 mg/dl    GFR est AA >60.0      GFR est non-AA >60      Calcium 8.6 8.5 - 10.1 mg/dl    AST (SGOT) 44 (H) 15 - 37 U/L    ALT (SGPT) 43 12 - 78 U/L    Alk. phosphatase 188 (H) 45 - 117 U/L    Bilirubin, total 0.5 0.2 - 1.0 mg/dl    Protein, total 7.1 6.4 - 8.2 gm/dl    Albumin 2.5 (L) 3.4 - 5.0 gm/dl    Anion gap 4 (L) 5 - 15 mmol/L       Problem List:  Problem List as of 04/10/2018 Date Reviewed: December 28, 2010          Codes Class Noted - Resolved    Gastritis ICD-10-CM: K29.70  ICD-9-CM: 535.50  04/04/2018 - Present        Previous cesarean delivery, antepartum condition or complication [id number]: U27.253  ICD-9-CM: 654.23  2010-12-28 - Present    Overview Signed December 28, 2010  4:20 PM by Fredirick Lathe T     Three prior cesarean deliveries             Bariatric surgery status complicating pregnancy, childbirth, or the puerperium, antepartum condition or complication [id number]: V42.595  ICD-9-CM: 649.23  12/28/10 - Present               Medications reviewed  Current Facility-Administered Medications   Medication Dose Route  Frequency   ??? bisacodyL (DULCOLAX) tablet 10 mg  10 mg Oral DAILY PRN   ??? acetaminophen (OFIRMEV) infusion 1,000 mg  1,000 mg IntraVENous Q8H PRN    ??? artificial tears (dextran 70-hypromellose) (NATURAL BALANCE) 0.1-0.3 % ophthalmic solution 1 Drop  1 Drop Left Eye QID PRN   ??? diphenhydrAMINE (BENADRYL) capsule 25 mg  25 mg Oral Q6H PRN   ??? dextrose 5% - 0.45% NaCl with KCl 20 mEq/L infusion  75 mL/hr IntraVENous CONTINUOUS   ??? ciprofloxacin (CIPRO) 400 mg in D5W IVPB (premix)  400 mg IntraVENous Q12H   ??? metroNIDAZOLE (FLAGYL) IVPB premix 500 mg  500 mg IntraVENous Q8H   ??? ondansetron (ZOFRAN) injection 4 mg  4 mg IntraVENous Q4H PRN   ??? oxyCODONE IR (ROXICODONE) tablet 5 mg  5 mg Oral Q4H PRN   ??? docusate sodium (COLACE) capsule 100 mg  100 mg Oral BID   ??? polyethylene glycol (MIRALAX) packet 17 g  17 g Oral BID   ??? bisacodyL (DULCOLAX) suppository 10 mg  10 mg Rectal DAILY PRN   ??? naloxone (NARCAN) injection 0.4 mg  0.4 mg IntraVENous EVERY 2 MINUTES AS NEEDED   ??? pantoprazole (PROTONIX) 40 mg in 0.9% sodium chloride 10 mL injection  40 mg IntraVENous Q24H   ??? multivitamin with iron (FLINTSTONES) chewable tablet 1 Tab  1 Tab Oral BID   ??? ferrous sulfate tablet 325 mg  1 Tab Oral DAILY WITH BREAKFAST   ??? dicyclomine (BENTYL) capsule 10 mg  10 mg Oral Q6H PRN   ??? acetaminophen (TYLENOL) tablet 650 mg  650 mg Oral Q6H PRN   ??? sodium chloride (NS) flush 5-10 mL  5-10 mL IntraVENous Q8H   ??? sodium chloride (NS) flush 5-10 mL  5-10 mL IntraVENous PRN   ??? naloxone (NARCAN) injection 0.1 mg  0.1 mg IntraVENous PRN   ??? ondansetron (ZOFRAN) injection 4 mg  4 mg IntraVENous Q6H PRN   ??? HYDROmorphone (DILAUDID) injection 0.5 mg  0.5 mg IntraVENous Q4H PRN        Care Plan discussed with: Patient/Family and Nurse    Total time spent with patient: 35 minutes.    Marzella Schlein, MD  April 10, 2018  19:38

## 2018-04-10 NOTE — Other (Signed)
Patient having complaints of moderate to severe pain. One time nausea no  vomiting. Patient is  ambulating in the halls and to the bathroom . Will continue to monitor.

## 2018-04-10 NOTE — Progress Notes (Signed)
NUTRITION RECOMMENDATIONS:   Lifted CCHO diet restriction - pt with no hx of elevated BG/DM per report and per lab values   Requesting to try Ensure Enlive (350 kcals, 20 gm protein) and Ensure Pudding (170 kcals, 4 gm protein) - added once daily for each  Rec continue thiamine, MVI with Fe/FeSo4 supplementation, consider adding vit B12 and Ca/vit D as well     NUTRITION INITIAL EVALUATION    NUTRITION ASSESSMENT:       Reason for assessment: LOS    Admitting diagnosis: Gastritis [K29.70]  Gastritis [K29.70]     PMH:   Past Medical History:   Diagnosis Date   ??? Ill-defined condition     bronchitis   ??? Pneumonia        Code Status: Full Code      Anthropometrics:  Height:   Ht Readings from Last 3 Encounters:   04/03/18 5' (1.524 m)   04/06/18 5\' 1"  (1.549 m)   03/31/18 5' (1.524 m)       Weight:   Wt Readings from Last 3 Encounters:   04/03/18 88.9 kg (196 lb)   04/06/18 80.9 kg (178 lb 5.6 oz)   03/31/18 88.9 kg (196 lb)       Weight source:         Unknown              ?? IBW: 45.45 kg      ?? % IBW: 196%     ?? BMI: Body mass index is 38.28 kg/m??.    ?? UBW: 188-196# per patient     ?? Wt change: stable          []  significant       []  not significant         []  intended         []  not intended Diet and intake history:  ?? Current diet order: DIET NUTRITIONAL SUPPLEMENTS Lunch; Ensure Enlive  ?? DIET NUTRITIONAL SUPPLEMENTS Dinner; Ensure Pudding  ?? DIET REGULAR    ?? Food allergies: none     ?? Diet/intake history: po intake decreased x 1 month 2/2 GI symptoms. Is pescatarian - avoids meat/poultry/pork, etc. Eggs okay. Cheese/yogurt okay. Avoids milk 2/2 GI upset - thinks this is due to lactose. Pt willing to try Ensure Enlive (is suitable for lactose intolerance) and is willing to try Ensure Pudding.            []  <50% intake x >5 days        []  <50% intake x >1 month        []  <75% intake x >7 days         [x]  <75% intake x 1 month         []  <75% intake x 3 months    ?? Current appetite/PO intake:          []    N/A- NPO        []    Very poor (<25% of meals)        []    Poor (<50% of meals)        [x]    Fair (50-75% of meals)         []    Good (>75% of meals)  % Diet Eaten  Avg: 63.8 %  Min: 0 %  Max: 100 %     ?? Assessment of current MNT: Reg diet appropriate, adequate to meet nutrition needs if po >75% with meals. Ensure  Enlive and Ensure Pudding to increase calorie/pro intake opportunity     ?? Cultural, religious, and ethnic food preferences identified: none       Physical Assessment:  ?? GI symptoms:   Adm with duodenal ulcer perforation mimicking acute pancreatitis, no MR e/o gallstones, RUQ [place]  Gallstone and subhepatic/peripancreatic fluid collection; hx RNYGB   Last Bowel Movement Date: 04/04/18     Abdominal Assessment: Semi-soft  Bowel Sounds: Hypoactive     ?? Chewing/swallowing issues:   None     ?? Skin integrity:   Intact        ?? Muscle wasting:   []  unable to assess at this time   [x]  none  []  mild:  []  moderate:  []  severe:    ?? Fat wasting:  []  unable to assess at this time   [x]  none  []  mild:  []  moderate:  []  severe:    ?? Fluid accumulation:   Edema  LLE: Trace  RLE: Trace     ?? Mental status:   Alert/oriented             ?? Functional status PTA:  Independent in ADLs/IADLs    Intake and output:    Intake/Output Summary (Last 24 hours) at 04/10/2018 0948  Last data filed at 04/10/2018 5400  Gross per 24 hour   Intake 3916.25 ml   Output 800 ml   Net 3116.25 ml       Estimated daily nutrition intake needs:  ?? 1200 - 1400 kcals (25-30 kcals/kg IBW)    ?? 90 - 110 g protein (1-1.2 gm/kg)    ?? 2250 mL fluid (25 mL/kg BW)     Living situation: []  alone, little/no support    [x]  alone, family nearby/supportive      []  with family/caregiver     []  in NH/SNF     []  homeless    Current pertinent medications: cipro, colace, FeSo4, flagyl, MVI with Fe, protonix, miralax, thiamine, D5/0.45/KCl @ 75 ml/hr     Pertinent labs: 1/28 - pert labs reviewed. Rec check Mg/Phos      Does patient meet ASPEN/AND criteria for malnutrition diagnosis: not at this time     NUTRITION DIAGNOSIS:     1. Altered GI function related to duodenal ulcer, hx RNYGB as evidenced by abd pain, nausea, med/sx hx, po <75% x 1 month    2. Inadequate oral intake related to duodenal abscess/ulcer as evidenced by po <75% x 1 month.    3. 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      NUTRITION INTERVENTION:     Recommended diet:   Lifted CCHO diet restriction - pt with no hx of elevated BG/DM per report and per lab values   Requesting to try Ensure Enlive (350 kcals, 20 gm protein) and Ensure Pudding (170 kcals, 4 gm protein) - added once daily for each  Rec continue thiamine, MVI with Fe/FeSo4 supplementation, consider adding vit B12 and Ca/vit D as well     NUTRITION MONITORING AND EVALUATION:     Nutrition level of care:  []  low       [x]  moderate      []  high    Nutrition monitoring: PO intake, diet tolerance and compliance, wt, BS, CMP, hydration, medical changes    Nutrition goals: PO >75% with meals/supplements, wt stable through LOS, nutrition related  lab values WNL     Anticipated Discharge Placement: Home    Monia Sabal, RD  04/10/18

## 2018-04-10 NOTE — Progress Notes (Signed)
INFECTIOUS DISEASE FOLLOW UP NOTE :-     Admit Date: 04/03/2018    Date of admission: 04/03/2018  ??  Date of consult: April 09, 2018  ??  ??  ABX:   ??  Current abx Prior abx    Cipro/flagyl 1/26-2  Zosyn 1/25???1   ??  ASSESSMENT:    ??  Abdominal fluid collection-concerning for abscess secondary to duodenal ulcer perforation  -CT 1/21 thickening of the horizontal portion of duodenum with surrounding inflammatory changes,?  Pancreatitis  -MRI 1/24 s/o lesion within the liver c/w focal fatty infiltration, prominent peripherally enhanced fluid adjacent to pancreatic head/uncinate process with extensive surrounding inflammatory changes?  Developing abscess-likely contained from ruptured ulcer  -Status post EGD with enteroscopy 1/24 showing normal mucosa but unable to reach JJ anastomosis  -Ultrasound 1/26 with subhepatic fluid collection, cholelithiasis, fatty liver   Right upper quadrant pain  -Suspect secondary to above  -Normal lipase-doubt pancreatitis  -Significantly elevated AST>> ALT but denies alcohol abuse  -No evidence of acute cholecystitis on ultrasound   History of gastric bypass in 2012   Constipation   Obesity with BMI of 38   Anemia   History of sleep apnea   Stress urinary incontinence   ??  ??  ??  RECOMMENDATIONS:   ??  This is complicated patient with history of previous gastric bypass surgery and coming with abdominal pain and finding of fluid collection adjacent to duodenum  ??  -concern for abscess at the junction of third and fourth portions of her duodenum with peripancreatic inflammation- ? sec be perforated duodenal ulcer though likely contained now.  Ultrasound showed gallstones but no concern for acute cholecystitis. Limited EGD showed normal mucosa  -Continue on IV Cipro and Flagyl and switch to p.o. when tolerating well  - Monitor WBc, remains afebrile  -H. pylori serology negative Patient is already on PPIs and antibiotics  hence other antigen testing will not be useful at this time  -We will suggest to repeat imaging next week and if still with significant fluid collection, she will benefit from either IR guided aspiration or surgical intervention  -Remains with constipation-suspect secondary to pain medications  -Monitor labs and cultures  -Will not treat bacteria in her urine as asymptomatic  -Multivitamins per others  -D/w Dr. Jerl Mina                 MICROBIOLOGY:   ??  1/18     urine culture 20 came mixed flora, 30 K E. coli R quinolones, Bactrim  ??  LINES AND CATHETERS:   PIV      SUBJECTIVE :     Interval notes reviewed.  Afebrile.  Sitting in a chair.  States still nauseous but no vomiting.  She had couple of bites of food but still no appetite.  Discussed with her will plan to switch antibiotics to oral once she started having good p.o. intake.  Plan will be to repeat imaging in 1 week.    OBJECTIVE     Visit Vitals  BP 110/72 (BP 1 Location: Right arm, BP Patient Position: Sitting)   Pulse 72   Temp 97.8 ??F (36.6 ??C)   Resp 18   Ht 5' (1.524 m)   Wt 88.9 kg (196 lb)   LMP 04/01/2018 (Exact Date)   SpO2 99%   BMI 38.28 kg/m??       Temp (24hrs), Avg:98.2 ??F (36.8 ??C), Min:97.8 ??F (36.6 ??C), Max:98.4 ??F (36.9 ??C)    General: Well-developed,  33 y.o. year-old, female, in no acute distress  HEENT: Normocephalic, anicteric sclerae, Pupils equal, round reactive to light  Neck: Supple, no lymphadenopathy, masses or thyromegaly  Chest: Symmetrical expansion  Lungs: Clear to auscultation bilaterally, no dullness  Heart: Regular rhythm, no murmur, no rub or gallop, No JVD  Abdomen: Soft, tender in epigastric area, no organomegaly, BS+  Musculoskeletal: Normal strength/tone. No edema. No clubbing or cyanosis  CNS: AAOx3. Cranial nerves II-XII intact. Grossly normal.No NR  SKIN: No skin lesion or rash. Dry, warm, intact        MEDICATIONS:     Current Facility-Administered Medications   Medication Dose Route Frequency    ??? artificial tears (dextran 70-hypromellose) (NATURAL BALANCE) 0.1-0.3 % ophthalmic solution 1 Drop  1 Drop Left Eye QID PRN   ??? diphenhydrAMINE (BENADRYL) capsule 25 mg  25 mg Oral Q6H PRN   ??? dextrose 5% - 0.45% NaCl with KCl 20 mEq/L infusion  75 mL/hr IntraVENous CONTINUOUS   ??? ciprofloxacin (CIPRO) 400 mg in D5W IVPB (premix)  400 mg IntraVENous Q12H   ??? metroNIDAZOLE (FLAGYL) IVPB premix 500 mg  500 mg IntraVENous Q8H   ??? ondansetron (ZOFRAN) injection 4 mg  4 mg IntraVENous Q4H PRN   ??? oxyCODONE IR (ROXICODONE) tablet 5 mg  5 mg Oral Q4H PRN   ??? docusate sodium (COLACE) capsule 100 mg  100 mg Oral BID   ??? polyethylene glycol (MIRALAX) packet 17 g  17 g Oral BID   ??? bisacodyL (DULCOLAX) suppository 10 mg  10 mg Rectal DAILY PRN   ??? naloxone (NARCAN) injection 0.4 mg  0.4 mg IntraVENous EVERY 2 MINUTES AS NEEDED   ??? pantoprazole (PROTONIX) 40 mg in 0.9% sodium chloride 10 mL injection  40 mg IntraVENous Q24H   ??? thiamine (B-1) injection 100 mg  100 mg IntraMUSCular DAILY   ??? multivitamin with iron (FLINTSTONES) chewable tablet 1 Tab  1 Tab Oral BID   ??? ferrous sulfate tablet 325 mg  1 Tab Oral DAILY WITH BREAKFAST   ??? dicyclomine (BENTYL) capsule 10 mg  10 mg Oral Q6H PRN   ??? acetaminophen (TYLENOL) tablet 650 mg  650 mg Oral Q6H PRN   ??? sodium chloride (NS) flush 5-10 mL  5-10 mL IntraVENous Q8H   ??? sodium chloride (NS) flush 5-10 mL  5-10 mL IntraVENous PRN   ??? naloxone (NARCAN) injection 0.1 mg  0.1 mg IntraVENous PRN   ??? ondansetron (ZOFRAN) injection 4 mg  4 mg IntraVENous Q6H PRN   ??? HYDROmorphone (DILAUDID) injection 0.5 mg  0.5 mg IntraVENous Q4H PRN       Labs: Results:   Chemistry Recent Labs     04/10/18  0218 04/08/18  0309 04/07/18  1231   GLU 76 88 95   NA 139 138 136   K 3.9 3.6 4.0   CL 107 104 105   CO2 28 27 26    BUN 4* 2* 2*   CREA 0.7 0.7 0.5*   CA 8.6 8.4* 8.4*   AGAP 4* 7 6   AP 188* 228* 135*   TP 7.1 6.8 6.3*   ALB 2.5* 2.3* 2.4*      CBC w/Diff Recent Labs     04/10/18  0218   WBC 5.4    RBC 3.00*   HGB 9.1*   HCT 29.5*   PLT 376   GRANS 54.7   LYMPH 29.6   EOS 1.1  RADIOLOGY :          Imaging  Results from Hospital Encounter encounter on 01/20/18   XR KNEE RT MIN 4 V    Narrative Indication: 79 without injury    4 views right knee show no bony or soft tissue abnormality.      Impression IMPRESSION: Normal study.         Results from Hospital Encounter encounter on 04/03/18   CT ABD PELV W CONT    Narrative CT of the abdomen and pelvis     INDICATION:  RUQ pain.      COMPARISON: No relevant studies.    TECHNIQUE: CT of the abdomen and pelvis was performed with nonionic intravenous  contrast with coronal and sagittal reformatted images.    DICOM format image data is available to non-affiliated external healthcare  facilities or entities on a secure, media free, reciprocally searchable basis  with patient authorization for 12 months following the date of the study.    FINDINGS:    LOWER THORAX: There are bilateral lower lobe infiltrates, left greater than  right. Additional areas of subsegmental atelectasis.    ABDOMEN: There is no free air or free fluid. There is a slightly ill-defined 2.8  cm hypoenhancing lesion within the left medial hepatic lobe adjacent to the  falciform ligament for which MRI of the abdomen with and without contrast is  suggested. There is fatty infiltration of the liver. There are no radiopaque  gallstones. The adrenal glands are unremarkable. There is no hydronephrosis or  nephrolithiasis.    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 There is thickening of  the horizontal portion of the duodenum with surrounding free fluid and  inflammatory change. This is inferior to the uncinate process of the pancreas  and inflammation likely secondary to duodenal inflammation. Lipase normal on  chart review. No definite free air. No dilated loops of bowel.         PELVIS: There is no focal bladder wall thickening. The uterus is present.   Negative for aortic dissection.    OSSEOUS STRUCTURES: Acutely intact.      Impression IMPRESSION:    1.  Thickening of the horizontal portion of duodenum with surrounding  inflammatory changes. Findings likely reflect duodenitis. Superimposed ulcer not  excluded. While inflammation is near the pancreas, lipase is normal in chart  review.  2.  Fatty infiltration of the liver. 2.8 cm indeterminate lesion within the left  medial hepatic lobe. MRI with and without contrast recommended.  3.  Bibasilar infiltrates/atelectasis.       I have independently examined the patient and reviewed lab studies and imgaing as well as review of nursing notes and physican notes from the past 24 hours.    Dragon medical dictation software was used for portions of this report. Unintended errors may occur.     Foye ClockAarti Shaquilla Kehres, MD  April 10, 2018    [facility]  [phone]

## 2018-04-11 DIAGNOSIS — K651 Peritoneal abscess: Secondary | ICD-10-CM | POA: Insufficient documentation

## 2018-04-11 DIAGNOSIS — K2980 Duodenitis without bleeding: Secondary | ICD-10-CM | POA: Insufficient documentation

## 2018-04-11 DIAGNOSIS — T801XXA Vascular complications following infusion, transfusion and therapeutic injection, initial encounter: Secondary | ICD-10-CM | POA: Insufficient documentation

## 2018-04-11 LAB — COMPREHENSIVE METABOLIC PANEL
ALT: 35 U/L (ref 12–78)
AST: 30 U/L (ref 15–37)
Albumin: 2.3 gm/dl — ABNORMAL LOW (ref 3.4–5.0)
Alkaline Phosphatase: 169 U/L — ABNORMAL HIGH (ref 45–117)
Anion Gap: 5 mmol/L (ref 5–15)
BUN: 2 mg/dl — ABNORMAL LOW (ref 7–25)
CO2: 28 mEq/L (ref 21–32)
Calcium: 8.9 mg/dl (ref 8.5–10.1)
Chloride: 107 mEq/L (ref 98–107)
Creatinine: 0.6 mg/dl (ref 0.6–1.3)
EGFR IF NonAfrican American: >60
GFR African American: >60
Glucose: 92 mg/dl (ref 74–106)
Potassium: 4.1 mEq/L (ref 3.5–5.1)
Sodium: 140 mEq/L (ref 136–145)
Total Bilirubin: 0.4 mg/dl (ref 0.2–1.0)
Total Protein: 6.7 gm/dl (ref 6.4–8.2)

## 2018-04-11 LAB — H PYLORI AB, IGG, QT
H. PYLORI AB, IGG: 0.46 (ref 0.00–0.74)
H. pylori Ab, IgG: 0.46 (ref 0.00–0.74)

## 2018-04-11 LAB — METABOLIC PANEL, COMPREHENSIVE
ALT (SGPT): 35 U/L (ref 12–78)
AST (SGOT): 30 U/L (ref 15–37)
Albumin: 2.3 gm/dl — ABNORMAL LOW (ref 3.4–5.0)
Alk. phosphatase: 169 U/L — ABNORMAL HIGH (ref 45–117)
Anion gap: 5 mmol/L (ref 5–15)
BUN: 2 mg/dl — ABNORMAL LOW (ref 7–25)
Bilirubin, total: 0.4 mg/dl (ref 0.2–1.0)
CO2: 28 mEq/L (ref 21–32)
Calcium: 8.9 mg/dl (ref 8.5–10.1)
Chloride: 107 mEq/L (ref 98–107)
Creatinine: 0.6 mg/dl (ref 0.6–1.3)
GFR est AA: >60
GFR est non-AA: >60
Glucose: 92 mg/dl (ref 74–106)
Potassium: 4.1 mEq/L (ref 3.5–5.1)
Protein, total: 6.7 gm/dl (ref 6.4–8.2)
Sodium: 140 mEq/L (ref 136–145)

## 2018-04-11 MED ORDER — METRONIDAZOLE 250 MG TAB
250 mg | Freq: Three times a day (TID) | ORAL | Status: DC
Start: 2018-04-11 — End: 2018-04-11
  Administered 2018-04-11: 21:00:00 via ORAL

## 2018-04-11 MED ORDER — CIPROFLOXACIN 500 MG TAB
500 mg | Freq: Two times a day (BID) | ORAL | Status: DC
Start: 2018-04-11 — End: 2018-04-11

## 2018-04-11 MED ORDER — ACETAMINOPHEN 1,000 MG/100 ML (10 MG/ML) IV
1000 mg/100 mL (10 mg/mL) | Freq: Three times a day (TID) | INTRAVENOUS | Status: AC | PRN
Start: 2018-04-11 — End: 2018-04-11
  Administered 2018-04-11: 07:00:00 via INTRAVENOUS

## 2018-04-11 MED FILL — METRONIDAZOLE 250 MG TAB: 250 mg | ORAL | Qty: 2

## 2018-04-11 MED FILL — OXYCODONE 5 MG TAB: 5 mg | ORAL | Qty: 1

## 2018-04-11 MED FILL — OFIRMEV 1,000 MG/100 ML (10 MG/ML) INTRAVENOUS SOLUTION: 1000 mg/100 mL (10 mg/mL) | INTRAVENOUS | Qty: 100

## 2018-04-11 MED FILL — METRONIDAZOLE IN SODIUM CHLORIDE (ISO-OSM) 500 MG/100 ML IV PIGGY BACK: 500 mg/100 mL | INTRAVENOUS | Qty: 100

## 2018-04-11 MED FILL — ONDANSETRON (PF) 4 MG/2 ML INJECTION: 4 mg/2 mL | INTRAMUSCULAR | Qty: 2

## 2018-04-11 MED FILL — FERROUS SULFATE 325 MG (65 MG ELEMENTAL IRON) TAB: 325 mg (65 mg iron) | ORAL | Qty: 1

## 2018-04-11 MED FILL — BISACODYL 5 MG TAB, DELAYED RELEASE: 5 mg | ORAL | Qty: 2

## 2018-04-11 MED FILL — CHILDS/IRON CHEWABLE TABLET: ORAL | Qty: 1

## 2018-04-11 MED FILL — POLYETHYLENE GLYCOL 3350 17 GRAM (100 %) ORAL POWDER PACKET: 17 gram | ORAL | Qty: 1

## 2018-04-11 MED FILL — CIPROFLOXACIN IN D5W 400 MG/200 ML IV PIGGY BACK: 400 mg/200 mL | INTRAVENOUS | Qty: 200

## 2018-04-11 MED FILL — DOCUSATE SODIUM 100 MG CAP: 100 mg | ORAL | Qty: 1

## 2018-04-11 MED FILL — BD POSIFLUSH NORMAL SALINE 0.9 % INJECTION SYRINGE: INTRAMUSCULAR | Qty: 10

## 2018-04-11 MED FILL — HYDROMORPHONE 1 MG/ML INJECTION SOLUTION: 1 mg/mL | INTRAMUSCULAR | Qty: 1

## 2018-04-11 MED FILL — DIPHENHYDRAMINE 25 MG CAP: 25 mg | ORAL | Qty: 1

## 2018-04-11 MED FILL — PANTOPRAZOLE 40 MG IV SOLR: 40 mg | INTRAVENOUS | Qty: 40

## 2018-04-11 NOTE — Discharge Summary (Signed)
Hospitalist Discharge Summary      Discharge Summary   Admit Date: 04/03/2018  Discharge Date:  04/11/18      Patient ID:  Ashley [name]  33 y.o.  June 25, 1985    Chief Complaint   Patient presents with   ??? Abdominal Pain       Discharge Diagnoses  Abdominal fluid collection: suspect abscess from contained duodenal ulcer/perforation:  -??s/p Zosyn, on cipro/flagyl.   -Patient reports improved abdominal pain today and is plan for discharge with Cipro and Flagyl to complete a 14-day course of antibiotics per ID  -MRI Abd reports enhancing fluid collection adjacent to the pancreas but concern for developing abscess from contained, ruptured ulcer vs pseudocyst.  -ID recommends repeat CT scan in 1 to 2 weeks  -normal Lipase.  - Sx suggests Abx course and eventual Lap chole, IOC, and Liver Bx, if fluid collection not resolved. Also trans gastric endoscopy to evaluate the duodenum.   -H.pylori IgA negative, IgG negative at 0.46.  -GI suggests PPI daily, and follow with Dr. Leonard Downingiongco as outpt once cleared by Sx, but has nothing else to offer currently.  ??  ??  ABD pain-RUQ , Epigastric pain radiating to the back,:  -Multifactorial, with concerns Gastritis and duodenitis, Possible anastomotic ulcer or contained ulcer as above.  -Also narcotic induced constipation, which she reports is improved today and she has on her own decreased frequency of narcotic use.  -She reports bowel movement on 1/29.  And better ability to tolerate p.o.  -May continue over-the-counter laxatives if needed.  -We will refill Percocet 5 mg every 6 hours #20 PRN for pain, and patient is aware not to excessively take narcotics given resolving constipation.  -Duodenal Ulcer and hx of Gastric bypass make NSAIDs unattractive options.  -normal lipase, no pancreatitis, see #1  ??  Focal fatty infiltration within the left medial hepatic lobe - 2.8 cm   -seen on MRI.  ??  H/o Roux-en-Y gastric bypass  ??  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  Constipation: opioid induced.-Resolved  -see above.  ??  Asymptomatic bacteruria:  -No plans for treatment from ID standpoint  ??  Costochondritis:  -May use perocet or OTC APAP as above.  ??  Infiltrated L UE IV site on 1/28  -treat conservatively.    Current Discharge Medication List      START taking these medications    Details   ciprofloxacin HCl (CIPRO) 500 mg tablet Take 1 Tab by mouth two (2) times a day for 9 days. Indications: an abscess within the abdomen  Qty: 18 Tab, Refills: 0      metroNIDAZOLE (FLAGYL) 500 mg tablet Take 1 Tab by mouth three (3) times daily for 9 days. Indications: infection within the abdomen  Qty: 27 Tab, Refills: 0      ferrous sulfate 325 mg (65 mg iron) tablet Take 1 Tab by mouth daily (with breakfast). Indications: anemia from inadequate iron  Qty: 30 Tab, Refills: 0      omeprazole (PRILOSEC) 40 mg capsule Take 1 Cap by mouth daily for 30 days. Indications: an ulcer of the duodenum  Qty: 30 Cap, Refills: 1         CONTINUE these medications which have CHANGED    Details   oxyCODONE-acetaminophen (PERCOCET) 5-325 mg per tablet Take 1 Tab by mouth every six (6) hours as needed for Pain for up to 4 days. Max Daily  Amount: 4 Tabs. Indications: pain  Qty: 16 Tab, Refills: 0    Associated Diagnoses: Intra-abdominal abscess (HCC)         CONTINUE these medications which have NOT CHANGED    Details   ondansetron (ZOFRAN ODT) 4 mg disintegrating tablet Take 1 Tab by mouth every eight (8) hours as needed for Nausea.  Qty: 20 Tab, Refills: 0      dicyclomine (BENTYL) 20 mg tablet Take 1 Tab by mouth every six (6) hours as needed (abdominal cramps) for up to 20 doses.  Qty: 20 Tab, Refills: 0      acetaminophen (TYLENOL) 325 mg tablet Take 2 Tabs by mouth every six (6) hours as needed for Pain.  Qty: 20 Tab, Refills: 0         STOP taking these medications       cephALEXin (KEFLEX) 500 mg capsule Comments:   Reason for Stopping:                 Initial  presentation:  From HPI: Ashley [name] is a 33 y.o. year old female who presents with abdominal pain, RUQ, nausea and vomiting.   Patient has hx of gastric bypass. She took motrin earlier without relive. Denies blood in stool, no diarrhea. CT shows duodenitis and cannot r/o ulcer. Lipase normal today, she was dx with pancreatitis about two days ago. She continues to have significant abdominal pain.     She was admitted with plans for IV fluids, pain management, PRN Zofran, Protonix, GI consultation    Hospital Course:   Duodenitis was noted on abdominal CT scan and plans were to continue pain control, PPI.  GI evaluated on the 23rd with concerns for differential diagnosis of anastomotic ulcer, penetrating ulcer, peptic ulcer disease, erosive esophagitis, gastritis, pancreatitis.  Noted CT changes related to gastric bypass, and normocytic anemia suspected secondary to IDA in the setting of history of gastric bypass.  Plan to continue Protonix twice daily, to order iron studies, and for MRI/MRCP to further evaluate the abdomen.  With plans for EGD with enteroscopy.  This showed findings as described below.  Opioid-induced constipation was also noted on the 25th.  She underwent MRI/MRCP on the 25th.  This showed what appeared to be a fluid collection concerning for abscess adjacent to the third and fourth portions of the duodenum suggesting up probably sealed off duodenal ulcer perforation mimicking acute pancreatitis.  GI at this point recommended a clear liquid diet, IV PPI, to consult bariatric surgery for opinions and recommendations.  And consider IR drainage if worsening symptoms, leukocytosis or fever.  General surgery evaluated and agreed with acid suppression with PPI, trial of antibiotics eventual repeat CT scan, checking H. pylori antibodies, to completely abstain from aspirin, NSAIDs, and smoking.  The patient was started on Zosyn.  Ultrasound the gallbladder was also pursued.  Diet was advanced per  general surgery although patient still had complaints of bloating related to constipation and abdominal pain.  ID was consulted for antibiotic recommendations on the 27th.  Antibiotics were adjusted to IV Cipro and Flagyl.  ID felt that H. pylori serology would not be very useful from differentiating active from previous infection and that as patient is already on PPI and antibiotics other antigen testing would be less useful at this time.  Also noted asymptomatic bacteriuria with no plans for treatment.  General surgery noted gallstones on gallbladder ultrasound but felt symptoms were likely related to periduodenal fluid collection and agree with course  of antibiotics and repeat CT with plans for eventual lap cholecystectomy, IOC, and liver biopsy, and if fluid collection has resolved and no additional intervention needed but could offer opportunity for transgastric endoscopy to evaluate the duodenum if necessary but recommended avoiding surgery and endoscopy in the acute phase with contained perforated ulcer.  GI added laxatives to help with narcotic induced constipation and recommended follow-up as outpatient once cleared by surgery.  The patient was still having a difficult time with constipation and abdominal symptoms on the 28th however she had begun to decrease her use of narcotics.  She did report some pain with a infiltrated left upper extremity IV which was removed on the 28th as well.  The patient reports that she is had a bowel movement on the 29th.  Her abdominal pain is improved.  She still has some discomfort related to the left upper extremity.  ID has converted her to oral antibiotics and recommends to complete a 14-day course with repeat CT scan in 1 to 2 weeks.  Otherwise the patient reports improved abdominal symptoms and she is plan for discharge today with plans for antibiotics as above as well as addition of PPI, iron.  Given continued pain with inability to use NSAIDs will refill her Percocet  for short course although she is been advised to avoid excessive narcotics to avoid worsening constipation.      Procedures:    EGD with enteroscopy performed on 04/06/2018  FINDINGS:  1. Esophagus - Z-line was located at 35 cm. No ulcer, mass, stricture or varices.  2. Stomach - Normal gastric pouch with intact gastrojejunal anastomosis. No anastomotic ulcer or  stricture.  3. Alimental jejunal limb - Normal mucosa. No ulcer, mass or strictures. Unable to reach and  intubate jejunojejunal anastomosis and distal duodenum.  ENDOSCOPIC DIAGNOSIS:  Normal post-RNYGB anatomy.  Failed retrograde attempt to reach J-J anastomosis and distal duodenum.  RECOMMENDATIONS:  1. Clear liquid diet as tolerated.  2. Wait MRI and MRCP. If gallstones are noted then would recommend laparoscopic  cholecystectomy together with laparoscopic evaluation of her distal stomach and duodenum.  3. Stop Protonix IV. Continue Bentyl.    Consultants:   Dr. Cipriano Bunker and colleagues of GI  Dr. Esau Grew of General Surgery  Dr. Foye Clock and colleagues of ID    Imaging:  Ct Abd Pelv W Cont    Result Date: 04/03/2018  CT of the abdomen and pelvis INDICATION:  RUQ pain.    COMPARISON: No relevant studies. TECHNIQUE: CT of the abdomen and pelvis was performed with nonionic intravenous contrast with coronal and sagittal reformatted images. DICOM format image data is available to non-affiliated external healthcare facilities or entities on a secure, media free, reciprocally searchable basis with patient authorization for 12 months following the date of the study. FINDINGS: LOWER THORAX: There are bilateral lower lobe infiltrates, left greater than right. Additional areas of subsegmental atelectasis. ABDOMEN: There is no free air or free fluid. There is a slightly ill-defined 2.8 cm hypoenhancing lesion within the left medial hepatic lobe adjacent to the falciform ligament for which MRI of the abdomen with and without contrast is suggested. There is  fatty infiltration of the liver. There are no radiopaque gallstones. The adrenal glands are unremarkable. There is no hydronephrosis or nephrolithiasis. 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 There is thickening of the horizontal portion of the duodenum with surrounding free fluid and inflammatory change. This is inferior to the uncinate process of the pancreas  and inflammation likely secondary to duodenal inflammation. Lipase normal on chart review. No definite free air. No dilated loops of bowel.     PELVIS: There is no focal bladder wall thickening. The uterus is present. Negative for aortic dissection. OSSEOUS STRUCTURES: Acutely intact.     IMPRESSION: 1.  Thickening of the horizontal portion of duodenum with surrounding inflammatory changes. Findings likely reflect duodenitis. Superimposed ulcer not excluded. While inflammation is near the pancreas, lipase is normal in chart review. 2.  Fatty infiltration of the liver. 2.8 cm indeterminate lesion within the left medial hepatic lobe. MRI with and without contrast recommended. 3.  Bibasilar infiltrates/atelectasis.        MRI Abd w/ and w/o contrast on 04/06/18   MRI ABDOMEN WITH AND WITHOUT CONTRAST  ??  CLINICAL HISTORY: Right upper quadrant pain  ??  COMPARISON: CT dated 04/03/2018  ??  TECHNIQUE: Multiplanar, multisequence imaging of the upper abdomen was performed  before and after administration of intravenous contrast.  ??  FINDINGS:  ??  Subsegmental atelectasis left greater than right lung base.  ??  2.7 cm focal lesion within the left hepatic lobe, adjacent to the falciform  ligament is consistent with focal hepatic steatosis. This region manifested as  signal dropout on the out of phase sequence. Liver, otherwise grossly  unremarkable. Gallbladder, spleen, adrenal glands and kidneys grossly  unremarkable. Gastric bypass surgery. Inflammatory change adjacent to the  pancreatic head and uncinate process. There is also thickening of the  distal  second and proximal third portions of the duodenum. Adjacent to the junction of  the second and third portions of the duodenum, there is a peripherally enhancing  8.5 x 4.6 x 2.8 cm (ML by CC by AP) fluid collection. The fluid collection is  intimate with the inferior margin of the duodenum within this region, and  extends to the right anterior perirenal fascia. This is concerning for a  developing abscess. This may be related to a contained, ruptured ulcer. Other  differential considerations may include pancreatic pseudocyst with possible  superimposed inflammation of the pancreatic head/uncinate process.  ??  IMPRESSION  IMPRESSION:  1. Lesion within the liver visualized on previous CT is most consistent with  focal fatty infiltration. No aggressive process.  2. Prominent peripherally enhancing fluid collection adjacent to the pancreatic  head/uncinate process with extensive surrounding inflammatory change as  described. This is concerning for a developing abscess. This may be related to a  contained, ruptured ulcer. Other differential considerations may include  pancreatic pseudocyst with possible superimposed inflammation of the pancreatic  head/uncinate process.  3. Subsegmental atelectasis within the lung bases.        Limited right upper quadrant ultrasound on 04/08/2018  Indication: Right upper quadrant pain  ??  Sonographic evaluation right upper quadrant shows diffusely echogenic liver.  Adjacent to the left hepatic lobe and superior to the pancreas is a fluid  collection measuring 2.4 x 1.5 x 3.0 cm which appears uncomplicated. A mobile  stone is seen in the gallbladder  without associated focal tenderness or wall  thickening. Common bile duct 2 mm. Pancreas, aorta, IVC, right kidney normal.  ??  IMPRESSION  IMPRESSION: Subhepatic fluid collection which may correspond to lesions seen on  prior MRI of 04/06/2018. Cholelithiasis without ultrasound criteria for acute  cholecystitis. Fatty  liver.    Physical Exam on Discharge:  Visit Vitals  BP 98/83 (BP Patient Position: Supine)   Pulse 97   Temp 98.1 ??F (36.7 ??C)  Resp 16   Ht 5' (1.524 m)   Wt 88.9 kg (196 lb)   LMP 04/01/2018 (Exact Date)   SpO2 91%   BMI 38.28 kg/m??     General: Alert, Obese AAF,  pleasant to conversation, NAD  CV: Regular rate and rhythm, no murmurs, rubs, gallops  Pulm: Lungs clear to auscultation bilaterally, no focal wheezes, crackles, rhonchi. Some mild pleuritic pain with inspiration.  GI: Soft, Improved abdominal tenderness, compressible, rare but present bowel sounds  Extremity: No clubbing, cyanosis, no pitting lower leg edema, capillary refill time intact to distal fingertips.  Area of ~3 cm of Induration at L UE at former IV site.  Skin: Warm, dry, tattoos      Most Recent BMP and CBC:    Lab Results   Component Value Date/Time    Sodium 140 04/11/2018 03:00 AM    Potassium 4.1 04/11/2018 03:00 AM    Chloride 107 04/11/2018 03:00 AM    CO2 28 04/11/2018 03:00 AM    Anion gap 5 04/11/2018 03:00 AM    Glucose 92 04/11/2018 03:00 AM    BUN 2 (L) 04/11/2018 03:00 AM    Creatinine 0.6 04/11/2018 03:00 AM    BUN/Creatinine ratio 12 04/01/2018 12:01 AM    GFR est AA >60.0 04/11/2018 03:00 AM    GFR est non-AA >60 04/11/2018 03:00 AM    Calcium 8.9 04/11/2018 03:00 AM      Lab Results   Component Value Date/Time    WBC 5.4 04/10/2018 02:18 AM    HGB 9.1 (L) 04/10/2018 02:18 AM    HCT 29.5 (L) 04/10/2018 02:18 AM    PLATELET 376 04/10/2018 02:18 AM    MCV 98.3 (H) 04/10/2018 02:18 AM          Condition at discharge: Stable.     Disposition:  Home      PCP:  Chipper Herb, Da, MD, F/up in one week    F/up with ID: Dr. Foye Clock or NP-Brenda Scott in 1-2 weeks    F/up with Surgery: Dr. Esau Grew in 1-2 weeks    F/up with GI: Dr. Cipriano Bunker after cleared by surgery.      Total time for DC was 36 minutes.      Scarlette Ar, MD  April 11, 2018  20:18

## 2018-04-11 NOTE — Progress Notes (Signed)
Progress  Notes by [name], Kasi Lasky, MD at 04/11/18 1500                Author: [name], [name], MD  Service: Infectious Disease  Author Type: Physician       Filed: 04/11/18 1510  Date of Service: 04/11/18 1500  Status: Signed          Editor: [name], Keylin Podolsky, MD (Physician)                                                                                       INFECTIOUS DISEASE PROGRESS NOTE             Reason for consult: Abd abscess      Date of admission: 04/03/2018      Date of consult: 04/10/18        ABX:           Current abx  Prior abx         Cipro/flagyl 1/26-2   Zosyn 1/25-1          ASSESSMENT:           Abdominal fluid collection-concerning for abscess secondary to duodenal ulcer perforation   -CT 1/21 thickening of the horizontal portion of duodenum with surrounding inflammatory changes,?  Pancreatitis   -MRI 1/24 s/o lesion within the liver c/w focal fatty infiltration, prominent peripherally enhanced fluid adjacent to pancreatic head/uncinate process with extensive surrounding inflammatory changes?  Developing abscess-likely contained from ruptured  ulcer   -Status post EGD with enteroscopy 1/24 showing normal mucosa but unable to reach JJ anastomosis   -Ultrasound 1/26 with subhepatic fluid collection, cholelithiasis, fatty liver       Right upper quadrant pain   -Suspect secondary to above   -Normal lipase-doubt pancreatitis   -Significantly elevated AST>> ALT but denies alcohol abuse   -No evidence of acute cholecystitis on ultrasound       History of gastric bypass in 2012       Constipation       Obesity with BMI of 38     Anemia     History of sleep apnea       Stress urinary incontinence                RECOMMENDATIONS:        This is complicated patient with history of previous gastric bypass surgery and coming with abdominal pain and finding of fluid collection adjacent to duodenum      -Patient had many imagings recently including ultrasound, CT and MRI.  There is a concern for 8.5 cm fluid  collection?  Abscess at the junction of third and fourth portions of her duodenum with peripancreatic inflammation.  There is a concern it might  be from perforated duodenal ulcer though likely contained now.  Ultrasound showed gallstones but no concern for acute cholecystitis. Limited EGD showed normal mucosa   -switched IV Cipro and Flagyl to PO as tolerating oral diet well , plan 14 days on it.    - Monitor WBc, remains afebrile   -H. pylori serology was ordered though will not be very useful as cannot differentiate from active  to previous infection.  Patient is already on PPIs and antibiotics hence other antigen testing will not be useful at this time   -We will suggest to repeat imaging in 1-2 week and if still with significant fluid collection, she will benefit from either IR guided aspiration or surgical intervention   -She had diarrhea 2 weeks ago but now with constipation-suspect secondary to pain medications   -Monitor labs and cultures   -Will not treat bacteria in her urine as asymptomatic   - pt eager to go home today, she will need close f/up with surgery, GI and ID. F/up with Dr [name] or [facility] in ID clinic in 1-2 week.                     MICROBIOLOGY:        1/18 urine culture 20 came mixed flora, 30 K E. coli R quinolones, Bactrim        LINES AND CATHETERS:     PIV        SUBJECTIVE:        Pt is feeling better, she does have iv infiltration with warmth/redness of left upper arm. She is tolerating oral diet well. Denies any diarrhea or rash. No n/v today.         Current Medications:          Current Facility-Administered Medications          Medication  Dose  Route  Frequency           ?  bisacodyL (DULCOLAX) tablet 10 mg   10 mg  Oral  DAILY PRN     ?  acetaminophen (OFIRMEV) infusion 1,000 mg   1,000 mg  IntraVENous  Q8H PRN     ?  artificial tears (dextran 70-hypromellose) (NATURAL BALANCE) 0.1-0.3 % ophthalmic solution 1 Drop   1 Drop  Left Eye  QID PRN     ?  diphenhydrAMINE (BENADRYL) capsule  25 mg   25 mg  Oral  Q6H PRN     ?  dextrose 5% - 0.45% NaCl with KCl 20 mEq/L infusion   75 mL/hr  IntraVENous  CONTINUOUS           ?  ciprofloxacin (CIPRO) 400 mg in D5W IVPB (premix)   400 mg  IntraVENous  Q12H           ?  metroNIDAZOLE (FLAGYL) IVPB premix 500 mg   500 mg  IntraVENous  Q8H     ?  ondansetron (ZOFRAN) injection 4 mg   4 mg  IntraVENous  Q4H PRN     ?  oxyCODONE IR (ROXICODONE) tablet 5 mg   5 mg  Oral  Q4H PRN     ?  docusate sodium (COLACE) capsule 100 mg   100 mg  Oral  BID     ?  polyethylene glycol (MIRALAX) packet 17 g   17 g  Oral  BID     ?  bisacodyL (DULCOLAX) suppository 10 mg   10 mg  Rectal  DAILY PRN     ?  naloxone (NARCAN) injection 0.4 mg   0.4 mg  IntraVENous  EVERY 2 MINUTES AS NEEDED     ?  pantoprazole (PROTONIX) 40 mg in 0.9% sodium chloride 10 mL injection   40 mg  IntraVENous  Q24H     ?  multivitamin with iron (FLINTSTONES) chewable tablet 1 Tab   1 Tab  Oral  BID     ?  ferrous sulfate tablet 325 mg   1 Tab  Oral  DAILY WITH BREAKFAST     ?  dicyclomine (BENTYL) capsule 10 mg   10 mg  Oral  Q6H PRN     ?  acetaminophen (TYLENOL) tablet 650 mg   650 mg  Oral  Q6H PRN     ?  sodium chloride (NS) flush 5-10 mL   5-10 mL  IntraVENous  Q8H     ?  sodium chloride (NS) flush 5-10 mL   5-10 mL  IntraVENous  PRN     ?  naloxone (NARCAN) injection 0.1 mg   0.1 mg  IntraVENous  PRN     ?  ondansetron (ZOFRAN) injection 4 mg   4 mg  IntraVENous  Q6H PRN           ?  HYDROmorphone (DILAUDID) injection 0.5 mg   0.5 mg  IntraVENous  Q4H PRN              Physical Exam:   Vitals   Temp (24hrs), Avg:98.4 ??F (36.9 ??C), Min:98 ??F (36.7 ??C), Max:98.8 ??F (37.1 ??C)      Visit Vitals      BP  98/83 (BP Patient Position: Supine)     Pulse  97     Temp  98.1 ??F (36.7 ??C)     Resp  16     Ht  5' (1.524 m)     Wt  88.9 kg (196 lb)     LMP  04/01/2018 (Exact Date)     SpO2  91%        BMI  38.28 kg/m??           General: Well-developed, 33 y.o. year-old, Ashley [name] , in no acute distress   HEENT:  Normocephalic, anicteric sclerae, Pupils equal, round reactive to light, no oropharyngeal lesions. No sinus tenderness.   Neck: Supple, no lymphadenopathy, masses or thyromegaly   Chest: Symmetrical expansion   Lungs: Clear to auscultation bilaterally, no dullness   Heart: Regular rhythm, no murmur, no rub or gallop, No JVD   Abdomen: Soft, tender in epigastric area, no organomegaly, BS+   Musculoskeletal: Normal strength/tone. No edema. No clubbing or cyanosis   CNS: AAOx3. Cranial nerves II-XII intact. Grossly normal.No NR   SKIN: No skin lesion or rash. Dry, warm, intact                 Labs:  Results:        Chemistry  Recent Labs         04/11/18   0300  04/10/18   0218      GLU  92  76      NA  140  139      K  4.1  3.9      CL  107  107      CO2  28  28      BUN  2*  4*      CREA  0.6  0.7      CA  8.9  8.6      AGAP  5  4*      AP  169*  188*      TP  6.7  7.1      ALB  2.3*  2.5*                 CBC w/Diff  Recent Labs         04/10/18  0218      WBC  5.4      RBC  3.00*      HGB  9.1*      HCT  29.5*      PLT  376      GRANS  54.7      LYMPH  29.6      EOS  1.1                 Microbiology  No results for input(s): CULT in the last 72 hours.           Imaging-        Results from Hospital Encounter encounter on 01/20/18     XR KNEE RT MIN 4 V           Narrative  Indication: 79 without injury      4 views right knee show no bony or soft tissue abnormality.              Impression  IMPRESSION: Normal study.                Results from Hospital Encounter encounter on 04/03/18     CT ABD PELV W CONT           Narrative  CT of the abdomen and pelvis       INDICATION:  RUQ pain.       COMPARISON: No relevant studies.      TECHNIQUE: CT of the abdomen and pelvis was performed with nonionic intravenous   contrast with coronal and sagittal reformatted images.      DICOM format image data is available to non-affiliated external healthcare   facilities or entities on a secure, media free, reciprocally searchable  basis   with patient authorization for 12 months following the date of the study.      FINDINGS:      LOWER THORAX: There are bilateral lower lobe infiltrates, left greater than   right. Additional areas of subsegmental atelectasis.      ABDOMEN: There is no free air or free fluid. There is a slightly ill-defined 2.8   cm hypoenhancing lesion within the left medial hepatic lobe adjacent to the   falciform ligament for which MRI of the abdomen with and without contrast is   suggested. There is fatty infiltration of the liver. There are no radiopaque   gallstones. The adrenal glands are unremarkable. There is no hydronephrosis or   nephrolithiasis.      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 There is thickening of   the horizontal portion of the duodenum with surrounding free fluid and   inflammatory change. This is inferior to the uncinate process of the pancreas   and inflammation likely secondary to duodenal inflammation. Lipase normal on   chart review. No definite free air. No dilated loops of bowel.           PELVIS: There is no focal bladder wall thickening. The uterus is present.   Negative for aortic dissection.      OSSEOUS STRUCTURES: Acutely intact.              Impression  IMPRESSION:      1.  Thickening of the horizontal portion of duodenum with surrounding   inflammatory changes. Findings likely reflect duodenitis. Superimposed ulcer not   excluded. While inflammation is near the pancreas, lipase is normal in chart   review.   2.  Fatty infiltration of the liver.  2.8 cm indeterminate lesion within the left   medial hepatic lobe. MRI with and without contrast recommended.   3.  Bibasilar infiltrates/atelectasis.              ---------------------------------------------------------------------------------------------------------------   I have independently examined the patient and reviewed all lab studies and imgaing as well as review of nursing notes and physican notes from the past  24 hours. The plan of care has been discussed with  the patient/relative and all questions are answered.       Dragon medical dictation software was used for portions of this report. Unintended errors may occur.       Radford PaxSushma Carlei Huang, MD   04/11/2018      [facility]   [phone]

## 2018-04-11 NOTE — Progress Notes (Signed)
Bedside and Verbal shift change report given to Patrice Paradise (RN) (oncoming nurse) by Mechele Dawley (LPN) (offgoing nurse). Report included the following information SBAR, Kardex, Intake/Output and MAR.

## 2018-04-11 NOTE — Progress Notes (Signed)
Problem: Falls - Risk of  Goal: *Absence of Falls  Description  Document Ashley [name] Fall Risk and appropriate interventions in the flowsheet.  Outcome: Progressing Towards Goal  Note: Fall Risk Interventions:  Mobility Interventions: Assess mobility with egress test, Bed/chair exit alarm         Medication Interventions: Assess postural VS orthostatic hypotension

## 2018-04-11 NOTE — Progress Notes (Signed)
Problem: Pain  Goal: *Control of Pain  Outcome: Progressing Towards Goal

## 2018-04-11 NOTE — Progress Notes (Signed)
Bedside and Verbal shift change report given to Nikia/Kim (oncoming nurse) by Keosha (offgoing nurse). Report included the following information SBAR.

## 2018-04-11 NOTE — Progress Notes (Signed)
Problem: Falls - Risk of  Goal: *Absence of Falls  Description  Document Ashley [name] Fall Risk and appropriate interventions in the flowsheet.  Outcome: Progressing Towards Goal  Note: Fall Risk Interventions:  Mobility Interventions: Assess mobility with egress test, Bed/chair exit alarm         Medication Interventions: Teach patient to arise slowly                   Problem: Pain  Goal: *Control of Pain  Outcome: Progressing Towards Goal

## 2018-04-11 NOTE — Progress Notes (Signed)
Problem: Falls - Risk of  Goal: *Absence of Falls  Description  Document Schmid Fall Risk and appropriate interventions in the flowsheet.  Outcome: Progressing Towards Goal  Note: Fall Risk Interventions:  Mobility Interventions: Assess mobility with egress test, Bed/chair exit alarm         Medication Interventions: Assess postural VS orthostatic hypotension

## 2018-04-11 NOTE — Progress Notes (Signed)
Problem: Falls - Risk of  Goal: *Absence of Falls  Description  Document Schmid Fall Risk and appropriate interventions in the flowsheet.  Outcome: Progressing Towards Goal  Note: Fall Risk Interventions:  Mobility Interventions: Assess mobility with egress test, Bed/chair exit alarm         Medication Interventions: Teach patient to arise slowly                   Problem: Pain  Goal: *Control of Pain  Outcome: Progressing Towards Goal

## 2018-04-11 NOTE — Progress Notes (Signed)
INFECTIOUS DISEASE PROGRESS NOTE         Reason for consult: Abd abscess    Date of admission: 04/03/2018    Date of consult: 04/10/18    ABX:     Current abx Prior abx    Cipro/flagyl 1/26-2  Zosyn 1/25???1     ASSESSMENT:      Abdominal fluid collection-concerning for abscess secondary to duodenal ulcer perforation  -CT 1/21 thickening of the horizontal portion of duodenum with surrounding inflammatory changes,?  Pancreatitis  -MRI 1/24 s/o lesion within the liver c/w focal fatty infiltration, prominent peripherally enhanced fluid adjacent to pancreatic head/uncinate process with extensive surrounding inflammatory changes?  Developing abscess-likely contained from ruptured ulcer  -Status post EGD with enteroscopy 1/24 showing normal mucosa but unable to reach JJ anastomosis  -Ultrasound 1/26 with subhepatic fluid collection, cholelithiasis, fatty liver   Right upper quadrant pain  -Suspect secondary to above  -Normal lipase-doubt pancreatitis  -Significantly elevated AST>> ALT but denies alcohol abuse  -No evidence of acute cholecystitis on ultrasound   History of gastric bypass in 2012   Constipation   Obesity with BMI of 38   Anemia   History of sleep apnea   Stress urinary incontinence         RECOMMENDATIONS:     This is complicated patient with history of previous gastric bypass surgery and coming with abdominal pain and finding of fluid collection adjacent to duodenum    -Patient had many imagings recently including ultrasound, CT and MRI.  There is a concern for 8.5 cm fluid collection?  Abscess at the junction of third and fourth portions of her duodenum with peripancreatic inflammation.  There is a concern it might be from perforated duodenal ulcer though likely contained now.  Ultrasound showed gallstones but no concern for acute cholecystitis. Limited EGD showed normal mucosa   -switched IV Cipro and Flagyl to PO as tolerating oral diet well , plan 14 days on it.   - Monitor WBc, remains afebrile  -H. pylori serology was ordered though will not be very useful as cannot differentiate from active to previous infection.  Patient is already on PPIs and antibiotics hence other antigen testing will not be useful at this time  -We will suggest to repeat imaging in 1-2 week and if still with significant fluid collection, she will benefit from either IR guided aspiration or surgical intervention  -She had diarrhea 2 weeks ago but now with constipation-suspect secondary to pain medications  -Monitor labs and cultures  -Will not treat bacteria in her urine as asymptomatic  - pt eager to go home today, she will need close f/up with surgery, GI and ID. F/up with Dr [name] or [facility] in ID clinic in 1-2 week.                 MICROBIOLOGY:     1/18 urine culture 20 came mixed flora, 30 K E. coli R quinolones, Bactrim    LINES AND CATHETERS:   PIV    SUBJECTIVE:     Pt is feeling better, she does have iv infiltration with warmth/redness of left upper arm. She is tolerating oral diet well. Denies any diarrhea or rash. No n/v today.     Current Medications:     Current Facility-Administered Medications   Medication Dose Route Frequency   ??? bisacodyL (DULCOLAX) tablet 10 mg  10 mg Oral DAILY PRN   ??? acetaminophen (OFIRMEV) infusion 1,000 mg  1,000 mg IntraVENous Q8H PRN   ???  artificial tears (dextran 70-hypromellose) (NATURAL BALANCE) 0.1-0.3 % ophthalmic solution 1 Drop  1 Drop Left Eye QID PRN   ??? diphenhydrAMINE (BENADRYL) capsule 25 mg  25 mg Oral Q6H PRN   ??? dextrose 5% - 0.45% NaCl with KCl 20 mEq/L infusion  75 mL/hr IntraVENous CONTINUOUS   ??? ciprofloxacin (CIPRO) 400 mg in D5W IVPB (premix)  400 mg IntraVENous Q12H   ??? metroNIDAZOLE (FLAGYL) IVPB premix 500 mg  500 mg IntraVENous Q8H   ??? ondansetron (ZOFRAN) injection 4 mg  4 mg IntraVENous Q4H PRN    ??? oxyCODONE IR (ROXICODONE) tablet 5 mg  5 mg Oral Q4H PRN   ??? docusate sodium (COLACE) capsule 100 mg  100 mg Oral BID   ??? polyethylene glycol (MIRALAX) packet 17 g  17 g Oral BID   ??? bisacodyL (DULCOLAX) suppository 10 mg  10 mg Rectal DAILY PRN   ??? naloxone (NARCAN) injection 0.4 mg  0.4 mg IntraVENous EVERY 2 MINUTES AS NEEDED   ??? pantoprazole (PROTONIX) 40 mg in 0.9% sodium chloride 10 mL injection  40 mg IntraVENous Q24H   ??? multivitamin with iron (FLINTSTONES) chewable tablet 1 Tab  1 Tab Oral BID   ??? ferrous sulfate tablet 325 mg  1 Tab Oral DAILY WITH BREAKFAST   ??? dicyclomine (BENTYL) capsule 10 mg  10 mg Oral Q6H PRN   ??? acetaminophen (TYLENOL) tablet 650 mg  650 mg Oral Q6H PRN   ??? sodium chloride (NS) flush 5-10 mL  5-10 mL IntraVENous Q8H   ??? sodium chloride (NS) flush 5-10 mL  5-10 mL IntraVENous PRN   ??? naloxone (NARCAN) injection 0.1 mg  0.1 mg IntraVENous PRN   ??? ondansetron (ZOFRAN) injection 4 mg  4 mg IntraVENous Q6H PRN   ??? HYDROmorphone (DILAUDID) injection 0.5 mg  0.5 mg IntraVENous Q4H PRN         Physical Exam:  Vitals  Temp (24hrs), Avg:98.4 ??F (36.9 ??C), Min:98 ??F (36.7 ??C), Max:98.8 ??F (37.1 ??C)    Visit Vitals  BP 98/83 (BP Patient Position: Supine)   Pulse 97   Temp 98.1 ??F (36.7 ??C)   Resp 16   Ht 5' (1.524 m)   Wt 88.9 kg (196 lb)   LMP 04/01/2018 (Exact Date)   SpO2 91%   BMI 38.28 kg/m??       General: Well-developed, 33 y.o. year-old, female, in no acute distress  HEENT: Normocephalic, anicteric sclerae, Pupils equal, round reactive to light, no oropharyngeal lesions. No sinus tenderness.  Neck: Supple, no lymphadenopathy, masses or thyromegaly  Chest: Symmetrical expansion  Lungs: Clear to auscultation bilaterally, no dullness  Heart: Regular rhythm, no murmur, no rub or gallop, No JVD  Abdomen: Soft, tender in epigastric area, no organomegaly, BS+  Musculoskeletal: Normal strength/tone. No edema. No clubbing or cyanosis   CNS: AAOx3. Cranial nerves II-XII intact. Grossly normal.No NR  SKIN: No skin lesion or rash. Dry, warm, intact          Labs: Results:   Chemistry Recent Labs     04/11/18  0300 04/10/18  0218   GLU 92 76   NA 140 139   K 4.1 3.9   CL 107 107   CO2 28 28   BUN 2* 4*   CREA 0.6 0.7   CA 8.9 8.6   AGAP 5 4*   AP 169* 188*   TP 6.7 7.1   ALB 2.3* 2.5*      CBC w/Diff Recent Labs  04/10/18  0218   WBC 5.4   RBC 3.00*   HGB 9.1*   HCT 29.5*   PLT 376   GRANS 54.7   LYMPH 29.6   EOS 1.1      Microbiology No results for input(s): CULT in the last 72 hours.       Imaging-    Results from Hospital Encounter encounter on 01/20/18   XR KNEE RT MIN 4 V    Narrative Indication: 79 without injury    4 views right knee show no bony or soft tissue abnormality.      Impression IMPRESSION: Normal study.         Results from Hospital Encounter encounter on 04/03/18   CT ABD PELV W CONT    Narrative CT of the abdomen and pelvis     INDICATION:  RUQ pain.      COMPARISON: No relevant studies.    TECHNIQUE: CT of the abdomen and pelvis was performed with nonionic intravenous  contrast with coronal and sagittal reformatted images.    DICOM format image data is available to non-affiliated external healthcare  facilities or entities on a secure, media free, reciprocally searchable basis  with patient authorization for 12 months following the date of the study.    FINDINGS:    LOWER THORAX: There are bilateral lower lobe infiltrates, left greater than  right. Additional areas of subsegmental atelectasis.    ABDOMEN: There is no free air or free fluid. There is a slightly ill-defined 2.8  cm hypoenhancing lesion within the left medial hepatic lobe adjacent to the  falciform ligament for which MRI of the abdomen with and without contrast is  suggested. There is fatty infiltration of the liver. There are no radiopaque  gallstones. The adrenal glands are unremarkable. There is no hydronephrosis or  nephrolithiasis.     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 There is thickening of  the horizontal portion of the duodenum with surrounding free fluid and  inflammatory change. This is inferior to the uncinate process of the pancreas  and inflammation likely secondary to duodenal inflammation. Lipase normal on  chart review. No definite free air. No dilated loops of bowel.         PELVIS: There is no focal bladder wall thickening. The uterus is present.  Negative for aortic dissection.    OSSEOUS STRUCTURES: Acutely intact.      Impression IMPRESSION:    1.  Thickening of the horizontal portion of duodenum with surrounding  inflammatory changes. Findings likely reflect duodenitis. Superimposed ulcer not  excluded. While inflammation is near the pancreas, lipase is normal in chart  review.  2.  Fatty infiltration of the liver. 2.8 cm indeterminate lesion within the left  medial hepatic lobe. MRI with and without contrast recommended.  3.  Bibasilar infiltrates/atelectasis.         ---------------------------------------------------------------------------------------------------------------  I have independently examined the patient and reviewed all lab studies and imgaing as well as review of nursing notes and physican notes from the past 24 hours. The plan of care has been discussed with the patient/relative and all questions are answered.     Dragon medical dictation software was used for portions of this report. Unintended errors may occur.     Radford Pax, MD  04/11/2018    [facility]  [phone]

## 2018-04-11 NOTE — Discharge Summary (Signed)
Hospitalist Discharge Summary      Discharge Summary   Admit Date: 04/03/2018  Discharge Date:  04/11/18      Patient ID:  Ashley [name]  33 y.o.  [birth year]    Chief Complaint   Patient presents with   ??? Abdominal Pain       Discharge Diagnoses  Abdominal fluid collection: suspect abscess from contained duodenal ulcer/perforation:  -??s/p Zosyn, on cipro/flagyl.   -Patient reports improved abdominal pain today and is plan for discharge with Cipro and Flagyl to complete a 14-day course of antibiotics per ID  -MRI Abd reports enhancing fluid collection adjacent to the pancreas but concern for developing abscess from contained, ruptured ulcer vs pseudocyst.  -ID recommends repeat CT scan in 1 to 2 weeks  -normal Lipase.  - Sx suggests Abx course and eventual Lap chole, IOC, and Liver Bx, if fluid collection not resolved. Also trans gastric endoscopy to evaluate the duodenum.   -H.pylori IgA negative, IgG negative at 0.46.  -GI suggests PPI daily, and follow with Dr. Leonard Downing as outpt once cleared by Sx, but has nothing else to offer currently.  ??  ??  ABD pain-RUQ , Epigastric pain radiating to the back,:  -Multifactorial, with concerns Gastritis and duodenitis, Possible anastomotic ulcer or contained ulcer as above.  -Also narcotic induced constipation, which she reports is improved today and she has on her own decreased frequency of narcotic use.  -She reports bowel movement on 1/29.  And better ability to tolerate p.o.  -May continue over-the-counter laxatives if needed.  -We will refill Percocet 5 mg every 6 hours #20 PRN for pain, and patient is aware not to excessively take narcotics given resolving constipation.  -Duodenal Ulcer and hx of Gastric bypass make NSAIDs unattractive options.  -normal lipase, no pancreatitis, see #1  ??  Focal fatty infiltration within the left medial hepatic lobe - 2.8 cm   -seen on MRI.  ??  H/o Roux-en-Y gastric bypass  ??  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  Constipation: opioid induced.-Resolved  -see above.  ??  Asymptomatic bacteruria:  -No plans for treatment from ID standpoint  ??  Costochondritis:  -May use perocet or OTC APAP as above.  ??  Infiltrated L UE IV site on 1/28  -treat conservatively.    Current Discharge Medication List      START taking these medications    Details   ciprofloxacin HCl (CIPRO) 500 mg tablet Take 1 Tab by mouth two (2) times a day for 9 days. Indications: an abscess within the abdomen  Qty: 18 Tab, Refills: 0      metroNIDAZOLE (FLAGYL) 500 mg tablet Take 1 Tab by mouth three (3) times daily for 9 days. Indications: infection within the abdomen  Qty: 27 Tab, Refills: 0      ferrous sulfate 325 mg (65 mg iron) tablet Take 1 Tab by mouth daily (with breakfast). Indications: anemia from inadequate iron  Qty: 30 Tab, Refills: 0      omeprazole (PRILOSEC) 40 mg capsule Take 1 Cap by mouth daily for 30 days. Indications: an ulcer of the duodenum  Qty: 30 Cap, Refills: 1         CONTINUE these medications which have CHANGED    Details   oxyCODONE-acetaminophen (PERCOCET) 5-325 mg per tablet Take 1 Tab by mouth every six (6) hours as needed for Pain for up to 4 days. Max Daily  Amount: 4 Tabs. Indications: pain  Qty: 16 Tab, Refills: 0    Associated Diagnoses: Intra-abdominal abscess (HCC)         CONTINUE these medications which have NOT CHANGED    Details   ondansetron (ZOFRAN ODT) 4 mg disintegrating tablet Take 1 Tab by mouth every eight (8) hours as needed for Nausea.  Qty: 20 Tab, Refills: 0      dicyclomine (BENTYL) 20 mg tablet Take 1 Tab by mouth every six (6) hours as needed (abdominal cramps) for up to 20 doses.  Qty: 20 Tab, Refills: 0      acetaminophen (TYLENOL) 325 mg tablet Take 2 Tabs by mouth every six (6) hours as needed for Pain.  Qty: 20 Tab, Refills: 0         STOP taking these medications       cephALEXin (KEFLEX) 500 mg capsule Comments:   Reason for Stopping:                  Initial presentation:  From HPI: Ashley [name] is a 33 y.o. year old female who presents with abdominal pain, RUQ, nausea and vomiting.   Patient has hx of gastric bypass. She took motrin earlier without relive. Denies blood in stool, no diarrhea. CT shows duodenitis and cannot r/o ulcer. Lipase normal today, she was dx with pancreatitis about two days ago. She continues to have significant abdominal pain.     She was admitted with plans for IV fluids, pain management, PRN Zofran, Protonix, GI consultation    Hospital Course:   Duodenitis was noted on abdominal CT scan and plans were to continue pain control, PPI.  GI evaluated on the 23rd with concerns for differential diagnosis of anastomotic ulcer, penetrating ulcer, peptic ulcer disease, erosive esophagitis, gastritis, pancreatitis.  Noted CT changes related to gastric bypass, and normocytic anemia suspected secondary to IDA in the setting of history of gastric bypass.  Plan to continue Protonix twice daily, to order iron studies, and for MRI/MRCP to further evaluate the abdomen.  With plans for EGD with enteroscopy.  This showed findings as described below.  Opioid-induced constipation was also noted on the 25th.  She underwent MRI/MRCP on the 25th.  This showed what appeared to be a fluid collection concerning for abscess adjacent to the third and fourth portions of the duodenum suggesting up probably sealed off duodenal ulcer perforation mimicking acute pancreatitis.  GI at this point recommended a clear liquid diet, IV PPI, to consult bariatric surgery for opinions and recommendations.  And consider IR drainage if worsening symptoms, leukocytosis or fever.  General surgery evaluated and agreed with acid suppression with PPI, trial of antibiotics eventual repeat CT scan, checking H. pylori antibodies, to completely abstain from aspirin, NSAIDs, and smoking.  The patient was started on Zosyn.  Ultrasound the  gallbladder was also pursued.  Diet was advanced per general surgery although patient still had complaints of bloating related to constipation and abdominal pain.  ID was consulted for antibiotic recommendations on the 27th.  Antibiotics were adjusted to IV Cipro and Flagyl.  ID felt that H. pylori serology would not be very useful from differentiating active from previous infection and that as patient is already on PPI and antibiotics other antigen testing would be less useful at this time.  Also noted asymptomatic bacteriuria with no plans for treatment.  General surgery noted gallstones on gallbladder ultrasound but felt symptoms were likely related to periduodenal fluid collection and agree with course  of antibiotics and repeat CT with plans for eventual lap cholecystectomy, IOC, and liver biopsy, and if fluid collection has resolved and no additional intervention needed but could offer opportunity for transgastric endoscopy to evaluate the duodenum if necessary but recommended avoiding surgery and endoscopy in the acute phase with contained perforated ulcer.  GI added laxatives to help with narcotic induced constipation and recommended follow-up as outpatient once cleared by surgery.  The patient was still having a difficult time with constipation and abdominal symptoms on the 28th however she had begun to decrease her use of narcotics.  She did report some pain with a infiltrated left upper extremity IV which was removed on the 28th as well.  The patient reports that she is had a bowel movement on the 29th.  Her abdominal pain is improved.  She still has some discomfort related to the left upper extremity.  ID has converted her to oral antibiotics and recommends to complete a 14-day course with repeat CT scan in 1 to 2 weeks.  Otherwise the patient reports improved abdominal symptoms and she is plan for discharge today with plans for antibiotics as above as well as  addition of PPI, iron.  Given continued pain with inability to use NSAIDs will refill her Percocet for short course although she is been advised to avoid excessive narcotics to avoid worsening constipation.      Procedures:    EGD with enteroscopy performed on 04/06/2018  FINDINGS:  1. Esophagus - Z-line was located at 35 cm. No ulcer, mass, stricture or varices.  2. Stomach - Normal gastric pouch with intact gastrojejunal anastomosis. No anastomotic ulcer or  stricture.  3. Alimental jejunal limb - Normal mucosa. No ulcer, mass or strictures. Unable to reach and  intubate jejunojejunal anastomosis and distal duodenum.  ENDOSCOPIC DIAGNOSIS:  Normal post-RNYGB anatomy.  Failed retrograde attempt to reach J-J anastomosis and distal duodenum.  RECOMMENDATIONS:  1. Clear liquid diet as tolerated.  2. Wait MRI and MRCP. If gallstones are noted then would recommend laparoscopic  cholecystectomy together with laparoscopic evaluation of her distal stomach and duodenum.  3. Stop Protonix IV. Continue Bentyl.    Consultants:   Dr. Cipriano Bunker and colleagues of GI  Dr. Esau Grew of General Surgery  Dr. Foye Clock and colleagues of ID    Imaging:  Ct Abd Pelv W Cont    Result Date: 04/03/2018  CT of the abdomen and pelvis INDICATION:  RUQ pain.    COMPARISON: No relevant studies. TECHNIQUE: CT of the abdomen and pelvis was performed with nonionic intravenous contrast with coronal and sagittal reformatted images. DICOM format image data is available to non-affiliated external healthcare facilities or entities on a secure, media free, reciprocally searchable basis with patient authorization for 12 months following the date of the study. FINDINGS: LOWER THORAX: There are bilateral lower lobe infiltrates, left greater than right. Additional areas of subsegmental atelectasis. ABDOMEN: There is no free air or free fluid. There is a slightly ill-defined 2.8 cm hypoenhancing lesion within the left medial  hepatic lobe adjacent to the falciform ligament for which MRI of the abdomen with and without contrast is suggested. There is fatty infiltration of the liver. There are no radiopaque gallstones. The adrenal glands are unremarkable. There is no hydronephrosis or nephrolithiasis. 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 There is thickening of the horizontal portion of the duodenum with surrounding free fluid and inflammatory change. This is inferior to the uncinate process of the pancreas  and inflammation likely secondary to duodenal inflammation. Lipase normal on chart review. No definite free air. No dilated loops of bowel.     PELVIS: There is no focal bladder wall thickening. The uterus is present. Negative for aortic dissection. OSSEOUS STRUCTURES: Acutely intact.     IMPRESSION: 1.  Thickening of the horizontal portion of duodenum with surrounding inflammatory changes. Findings likely reflect duodenitis. Superimposed ulcer not excluded. While inflammation is near the pancreas, lipase is normal in chart review. 2.  Fatty infiltration of the liver. 2.8 cm indeterminate lesion within the left medial hepatic lobe. MRI with and without contrast recommended. 3.  Bibasilar infiltrates/atelectasis.        MRI Abd w/ and w/o contrast on 04/06/18   MRI ABDOMEN WITH AND WITHOUT CONTRAST  ??  CLINICAL HISTORY: Right upper quadrant pain  ??  COMPARISON: CT dated 04/03/2018  ??  TECHNIQUE: Multiplanar, multisequence imaging of the upper abdomen was performed  before and after administration of intravenous contrast.  ??  FINDINGS:  ??  Subsegmental atelectasis left greater than right lung base.  ??  2.7 cm focal lesion within the left hepatic lobe, adjacent to the falciform  ligament is consistent with focal hepatic steatosis. This region manifested as  signal dropout on the out of phase sequence. Liver, otherwise grossly  unremarkable. Gallbladder, spleen, adrenal glands and kidneys grossly   unremarkable. Gastric bypass surgery. Inflammatory change adjacent to the  pancreatic head and uncinate process. There is also thickening of the distal  second and proximal third portions of the duodenum. Adjacent to the junction of  the second and third portions of the duodenum, there is a peripherally enhancing  8.5 x 4.6 x 2.8 cm (ML by CC by AP) fluid collection. The fluid collection is  intimate with the inferior margin of the duodenum within this region, and  extends to the right anterior perirenal fascia. This is concerning for a  developing abscess. This may be related to a contained, ruptured ulcer. Other  differential considerations may include pancreatic pseudocyst with possible  superimposed inflammation of the pancreatic head/uncinate process.  ??  IMPRESSION  IMPRESSION:  1. Lesion within the liver visualized on previous CT is most consistent with  focal fatty infiltration. No aggressive process.  2. Prominent peripherally enhancing fluid collection adjacent to the pancreatic  head/uncinate process with extensive surrounding inflammatory change as  described. This is concerning for a developing abscess. This may be related to a  contained, ruptured ulcer. Other differential considerations may include  pancreatic pseudocyst with possible superimposed inflammation of the pancreatic  head/uncinate process.  3. Subsegmental atelectasis within the lung bases.        Limited right upper quadrant ultrasound on 04/08/2018  Indication: Right upper quadrant pain  ??  Sonographic evaluation right upper quadrant shows diffusely echogenic liver.  Adjacent to the left hepatic lobe and superior to the pancreas is a fluid  collection measuring 2.4 x 1.5 x 3.0 cm which appears uncomplicated. A mobile  stone is seen in the gallbladder  without associated focal tenderness or wall  thickening. Common bile duct 2 mm. Pancreas, aorta, IVC, right kidney normal.  ??  IMPRESSION   IMPRESSION: Subhepatic fluid collection which may correspond to lesions seen on  prior MRI of 04/06/2018. Cholelithiasis without ultrasound criteria for acute  cholecystitis. Fatty liver.    Physical Exam on Discharge:  Visit Vitals  BP 98/83 (BP Patient Position: Supine)   Pulse 97   Temp 98.1 ??F (36.7 ??C)  Resp 16   Ht 5' (1.524 m)   Wt 88.9 kg (196 lb)   LMP 04/01/2018 (Exact Date)   SpO2 91%   BMI 38.28 kg/m??     General: Alert, Obese AAF,  pleasant to conversation, NAD  CV: Regular rate and rhythm, no murmurs, rubs, gallops  Pulm: Lungs clear to auscultation bilaterally, no focal wheezes, crackles, rhonchi. Some mild pleuritic pain with inspiration.  GI: Soft, Improved abdominal tenderness, compressible, rare but present bowel sounds  Extremity: No clubbing, cyanosis, no pitting lower leg edema, capillary refill time intact to distal fingertips.  Area of ~3 cm of Induration at L UE at former IV site.  Skin: Warm, dry, tattoos      Most Recent BMP and CBC:    Lab Results   Component Value Date/Time    Sodium 140 04/11/2018 03:00 AM    Potassium 4.1 04/11/2018 03:00 AM    Chloride 107 04/11/2018 03:00 AM    CO2 28 04/11/2018 03:00 AM    Anion gap 5 04/11/2018 03:00 AM    Glucose 92 04/11/2018 03:00 AM    BUN 2 (L) 04/11/2018 03:00 AM    Creatinine 0.6 04/11/2018 03:00 AM    BUN/Creatinine ratio 12 04/01/2018 12:01 AM    GFR est AA >60.0 04/11/2018 03:00 AM    GFR est non-AA >60 04/11/2018 03:00 AM    Calcium 8.9 04/11/2018 03:00 AM      Lab Results   Component Value Date/Time    WBC 5.4 04/10/2018 02:18 AM    HGB 9.1 (L) 04/10/2018 02:18 AM    HCT 29.5 (L) 04/10/2018 02:18 AM    PLATELET 376 04/10/2018 02:18 AM    MCV 98.3 (H) 04/10/2018 02:18 AM          Condition at discharge: Stable.     Disposition:  Home      PCP:  Chipper HerbZhang, Da, MD, F/up in one week    F/up with ID: Dr. Foye ClockAarti Desai or NP-Brenda Scott in 1-2 weeks    F/up with Surgery: Dr. Esau GrewGlen Moore in 1-2 weeks     F/up with GI: Dr. Cipriano BunkerFelix Tiongco after cleared by surgery.      Total time for DC was 36 minutes.      Scarlette ArJoseph Shamika Pedregon, MD  April 11, 2018  20:18

## 2018-04-11 NOTE — Progress Notes (Signed)
Bedside and Verbal shift change report given to Lilian K (RN) (oncoming nurse) by Nikia (LPN) (offgoing nurse). Report included the following information SBAR, Kardex, Intake/Output and MAR.

## 2018-04-12 LAB — GASTRIN: Gastrin, serum: 19 pg/mL (ref <=100)

## 2018-04-12 MED ORDER — FERROUS SULFATE 325 MG (65 MG ELEMENTAL IRON) TAB
325 mg (65 mg iron) | ORAL_TABLET | Freq: Every day | ORAL | 0 refills | Status: AC
Start: 2018-04-12 — End: ?

## 2018-04-12 MED ORDER — OXYCODONE-ACETAMINOPHEN 5 MG-325 MG TAB
5-325 mg | ORAL_TABLET | Freq: Four times a day (QID) | ORAL | 0 refills | Status: AC | PRN
Start: 2018-04-12 — End: 2018-04-15

## 2018-04-12 MED ORDER — OMEPRAZOLE 40 MG CAP, DELAYED RELEASE
40 mg | ORAL_CAPSULE | Freq: Every day | ORAL | 1 refills | Status: AC
Start: 2018-04-12 — End: 2018-05-11

## 2018-04-12 MED ORDER — CIPROFLOXACIN 500 MG TAB
500 mg | ORAL_TABLET | Freq: Two times a day (BID) | ORAL | 0 refills | Status: AC
Start: 2018-04-12 — End: 2018-04-20

## 2018-04-12 MED ORDER — METRONIDAZOLE 500 MG TAB
500 mg | ORAL_TABLET | Freq: Three times a day (TID) | ORAL | 0 refills | Status: AC
Start: 2018-04-12 — End: 2018-04-20

## 2018-04-12 MED FILL — DOCUSATE SODIUM 100 MG CAP: 100 mg | ORAL | Qty: 1

## 2018-04-12 MED FILL — OXYCODONE 5 MG TAB: 5 mg | ORAL | Qty: 1

## 2018-04-12 MED FILL — POLYETHYLENE GLYCOL 3350 17 GRAM (100 %) ORAL POWDER PACKET: 17 gram | ORAL | Qty: 1

## 2018-04-12 MED FILL — HYDROMORPHONE 1 MG/ML INJECTION SOLUTION: 1 mg/mL | INTRAMUSCULAR | Qty: 1

## 2018-04-12 MED FILL — CHILDS/IRON CHEWABLE TABLET: ORAL | Qty: 1

## 2018-04-12 MED FILL — BD POSIFLUSH NORMAL SALINE 0.9 % INJECTION SYRINGE: INTRAMUSCULAR | Qty: 10

## 2018-04-12 MED FILL — FERROUS SULFATE 325 MG (65 MG ELEMENTAL IRON) TAB: 325 mg (65 mg iron) | ORAL | Qty: 1

## 2018-04-12 NOTE — Progress Notes (Signed)
Problem: Falls - Risk of  Goal: *Absence of Falls  Description  Document Ashley [name] Fall Risk and appropriate interventions in the flowsheet.  Outcome: Progressing Towards Goal  Note: Fall Risk Interventions:  Mobility Interventions: Assess mobility with egress test         Medication Interventions: Teach patient to arise slowly                   Problem: Pain  Goal: *Control of Pain  Outcome: Progressing Towards Goal

## 2018-04-12 NOTE — Progress Notes (Signed)
Discharge Plan:   home    Discharge Date:     04/12/2018     Assisted Living Facility:    n/a      The facility confirmed that they are ready to receive the patient:     Yes/No; the CM spoke with     n/a    Does the patient have  an insurance company assigned cm   Yes/NO    Spoke with case Production designer, theatre/television/film  n/a @ n/a      Home Health Needed:     n/a    Home Health Agency:    n/a    FOC given :     Confirmed start of care with the home health agency and spoke with:      n/a      Transportation: Family/    Transport Time:    morning     patient, nurse  aware of pickup time    yes

## 2018-04-12 NOTE — Progress Notes (Signed)
Bedside and Verbal shift change report given to Nikia/Kim (oncoming nurse) by Lilian (offgoing nurse). Report included the following information SBAR.

## 2018-04-12 NOTE — Progress Notes (Signed)
Problem: Falls - Risk of  Goal: *Absence of Falls  Description  Document Schmid Fall Risk and appropriate interventions in the flowsheet.  Outcome: Progressing Towards Goal  Note: Fall Risk Interventions:  Mobility Interventions: Assess mobility with egress test         Medication Interventions: Teach patient to arise slowly                   Problem: Pain  Goal: *Control of Pain  Outcome: Progressing Towards Goal

## 2018-04-12 NOTE — Progress Notes (Signed)
Discharge Plan:   home    Discharge Date:     04/12/2018     Assisted Living Facility:    n/a      The facility confirmed that they are ready to receive the patient:     Yes/No; the CM spoke with     n/a    Does the patient have  an insurance company assigned cm   Yes/NO    Spoke with case manager  n/a @ n/a      Home Health Needed:     n/a    Home Health Agency:    n/a    FOC given :     Confirmed start of care with the home health agency and spoke with:      n/a      Transportation: Family/    Transport Time:    morning     patient, nurse  aware of pickup time    yes

## 2018-04-12 NOTE — Other (Addendum)
----------  DocumentID: [id number]------------------------------------------------              [facility]                       Patient Education Report         Name: Ashley [name], Ashley [name]                  Date: 04/05/2018    MRN: [id number]                    Time: 7:10:34 AM         Patient ordered video: 'Patient Safety: Stay Safe While you are in the Hospital'    from [id number] via phone number: [phone] at 7:10:34 AM    Description: This program outlines some of the precautions patients can take to ensure a speedy recovery without extra complications. The video emphasizes the importance of communicating with the healthcare team.    ----------DocumentID: [id number]------------------------------------------------                       [facility]          Patient Education Report - Discharge Summary        Date: 04/12/2018   Time: 11:20:18 AM   Name: Ashley [name], Ashley [name]   MRN: [id number]      Account Number: [id number]      Education History:        Patient ordered video: 'Patient Safety: Stay Safe While you are in the Hospital' from [id number] on 04/05/2018 07:10:34 AM

## 2018-04-18 ENCOUNTER — Encounter

## 2018-04-25 ENCOUNTER — Inpatient Hospital Stay: Admit: 2018-04-25 | Payer: MEDICAID | Attending: Infectious Disease | Primary: Family Medicine

## 2018-04-25 DIAGNOSIS — K269 Duodenal ulcer, unspecified as acute or chronic, without hemorrhage or perforation: Secondary | ICD-10-CM

## 2018-04-25 MED ORDER — IOPAMIDOL 61 % IV SOLN
300 mg iodine /mL (61 %) | Freq: Once | INTRAVENOUS | Status: AC
Start: 2018-04-25 — End: 2018-04-25
  Administered 2018-04-25: 20:00:00 via INTRAVENOUS

## 2018-04-25 MED FILL — ISOVUE-300  61 % INTRAVENOUS SOLUTION: 300 mg iodine /mL (61 %) | INTRAVENOUS | Qty: 80

## 2018-08-28 ENCOUNTER — Emergency Department: Admit: 2018-08-28 | Payer: MEDICAID | Primary: Family Medicine

## 2018-08-28 ENCOUNTER — Inpatient Hospital Stay
Admit: 2018-08-28 | Discharge: 2018-08-31 | Disposition: A | Discharge status: Home / Self Care | Payer: MEDICAID | Attending: Internal Medicine | Admitting: Internal Medicine

## 2018-08-28 DIAGNOSIS — K859 Acute pancreatitis without necrosis or infection, unspecified: Secondary | ICD-10-CM | POA: Diagnosis present

## 2018-08-28 DIAGNOSIS — K852 Alcohol induced acute pancreatitis without necrosis or infection: Principal | ICD-10-CM

## 2018-08-28 LAB — POC URINE MACROSCOPIC
Bilirubin, Urine: NEGATIVE
Bilirubin: NEGATIVE
Blood, Urine: NEGATIVE
Blood: NEGATIVE
Glucose, Ur: NEGATIVE mg/dl
Glucose: NEGATIVE mg/dl
Ketone: NEGATIVE mg/dl
Ketones, Urine: NEGATIVE mg/dl
Leukocyte Esterase, Urine: NEGATIVE
Leukocyte Esterase: NEGATIVE
Nitrite, Urine: POSITIVE — AB
Nitrites: POSITIVE — AB
Specific Gravity, UA: 1.025 (ref 1.005–1.030)
Specific gravity: 1.025 (ref 1.005–1.030)
Urobilinogen, UA, POCT: 0.2 EU/dl (ref 0.0–1.0)
Urobilinogen: 0.2 EU/dl (ref 0.0–1.0)
pH (UA): 6 (ref 5–9)
pH, UA: 6 (ref 5–9)

## 2018-08-28 LAB — POC URINE MICROSCOPIC

## 2018-08-28 LAB — EKG 12-LEAD
Atrial Rate: 88 {beats}/min
Diagnosis: NORMAL
P Axis: 53 degrees
P-R Interval: 140 ms
Q-T Interval: 362 ms
QRS Duration: 82 ms
QTc Calculation (Bazett): 438 ms
R Axis: 23 degrees
T Axis: 23 degrees
Ventricular Rate: 88 {beats}/min

## 2018-08-28 LAB — CBC WITH AUTO DIFFERENTIAL
Basophils %: 0.1 % (ref 0–3)
Eosinophils %: 0.2 % (ref 0–5)
Hematocrit: 38 % (ref 37.0–50.0)
Hemoglobin: 12.8 gm/dl — ABNORMAL LOW (ref 13.0–17.2)
Immature Granulocytes: 0.4 % (ref 0.0–3.0)
Lymphocytes %: 19.1 % — ABNORMAL LOW (ref 28–48)
MCH: 33.1 pg (ref 25.4–34.6)
MCHC: 33.7 gm/dl (ref 30.0–36.0)
MCV: 98.2 fL — ABNORMAL HIGH (ref 80.0–98.0)
MPV: 10.7 fL — ABNORMAL HIGH (ref 6.0–10.0)
Monocytes %: 6.4 % (ref 1–13)
Neutrophils %: 73.8 % — ABNORMAL HIGH (ref 34–64)
Nucleated RBCs: 0 (ref 0–0)
Platelets: 255 10*3/uL (ref 140–450)
RBC: 3.87 M/uL (ref 3.60–5.20)
RDW-SD: 54.8 — ABNORMAL HIGH (ref 36.4–46.3)
WBC: 8.3 10*3/uL (ref 4.0–11.0)

## 2018-08-28 LAB — COMPREHENSIVE METABOLIC PANEL
ALT: 61 U/L (ref 12–78)
AST: 72 U/L — ABNORMAL HIGH (ref 15–37)
Albumin: 3 gm/dl — ABNORMAL LOW (ref 3.4–5.0)
Alkaline Phosphatase: 164 U/L — ABNORMAL HIGH (ref 45–117)
Anion Gap: 7 mmol/L (ref 5–15)
BUN: 7 mg/dl (ref 7–25)
CO2: 23 mEq/L (ref 21–32)
Calcium: 8.3 mg/dl — ABNORMAL LOW (ref 8.5–10.1)
Chloride: 106 mEq/L (ref 98–107)
Creatinine: 0.8 mg/dl (ref 0.6–1.3)
EGFR IF NonAfrican American: >60
GFR African American: >60
Glucose: 71 mg/dl — ABNORMAL LOW (ref 74–106)
Potassium: 3.7 mEq/L (ref 3.5–5.1)
Sodium: 136 mEq/L (ref 136–145)
Total Bilirubin: 0.5 mg/dl (ref 0.2–1.0)
Total Protein: 7.3 gm/dl (ref 6.4–8.2)

## 2018-08-28 LAB — TROPONIN
Troponin I: <0.015 ng/ml (ref 0.000–0.045)
Troponin I: <0.015 ng/ml (ref 0.000–0.045)

## 2018-08-28 LAB — LIPASE
Lipase: 396 U/L — ABNORMAL HIGH (ref 73–393)
Lipase: 396 U/L — ABNORMAL HIGH (ref 73–393)

## 2018-08-28 LAB — POC HCG,URINE
HCG urine, QL: NEGATIVE
Pregnancy Test(Urn): NEGATIVE

## 2018-08-28 LAB — METABOLIC PANEL, COMPREHENSIVE
ALT (SGPT): 61 U/L (ref 12–78)
AST (SGOT): 72 U/L — ABNORMAL HIGH (ref 15–37)
Albumin: 3 gm/dl — ABNORMAL LOW (ref 3.4–5.0)
Alk. phosphatase: 164 U/L — ABNORMAL HIGH (ref 45–117)
Anion gap: 7 mmol/L (ref 5–15)
BUN: 7 mg/dl (ref 7–25)
Bilirubin, total: 0.5 mg/dl (ref 0.2–1.0)
CO2: 23 mEq/L (ref 21–32)
Calcium: 8.3 mg/dl — ABNORMAL LOW (ref 8.5–10.1)
Chloride: 106 mEq/L (ref 98–107)
Creatinine: 0.8 mg/dl (ref 0.6–1.3)
GFR est AA: >60
GFR est non-AA: >60
Glucose: 71 mg/dl — ABNORMAL LOW (ref 74–106)
Potassium: 3.7 mEq/L (ref 3.5–5.1)
Protein, total: 7.3 gm/dl (ref 6.4–8.2)
Sodium: 136 mEq/L (ref 136–145)

## 2018-08-28 LAB — EKG, 12 LEAD, INITIAL
Atrial Rate: 88 {beats}/min
Calculated P Axis: 53 degrees
Calculated R Axis: 23 degrees
Calculated T Axis: 23 degrees
Diagnosis: NORMAL
P-R Interval: 140 ms
Q-T Interval: 362 ms
QRS Duration: 82 ms
QTC Calculation (Bezet): 438 ms
Ventricular Rate: 88 {beats}/min

## 2018-08-28 LAB — CBC WITH AUTOMATED DIFF
BASOPHILS: 0.1 % (ref 0–3)
EOSINOPHILS: 0.2 % (ref 0–5)
HCT: 38 % (ref 37.0–50.0)
HGB: 12.8 gm/dl — ABNORMAL LOW (ref 13.0–17.2)
IMMATURE GRANULOCYTES: 0.4 % (ref 0.0–3.0)
LYMPHOCYTES: 19.1 % — ABNORMAL LOW (ref 28–48)
MCH: 33.1 pg (ref 25.4–34.6)
MCHC: 33.7 gm/dl (ref 30.0–36.0)
MCV: 98.2 fL — ABNORMAL HIGH (ref 80.0–98.0)
MONOCYTES: 6.4 % (ref 1–13)
MPV: 10.7 fL — ABNORMAL HIGH (ref 6.0–10.0)
NEUTROPHILS: 73.8 % — ABNORMAL HIGH (ref 34–64)
NRBC: 0 (ref 0–0)
PLATELET: 255 10*3/uL (ref 140–450)
RBC: 3.87 M/uL (ref 3.60–5.20)
RDW-SD: 54.8 — ABNORMAL HIGH (ref 36.4–46.3)
WBC: 8.3 10*3/uL (ref 4.0–11.0)

## 2018-08-28 LAB — TROPONIN I
Troponin-I: <0.015 ng/ml (ref 0.000–0.045)
Troponin-I: <0.015 ng/ml (ref 0.000–0.045)

## 2018-08-28 MED ORDER — MORPHINE 4 MG/ML SYRINGE
4 mg/mL | INTRAMUSCULAR | Status: AC
Start: 2018-08-28 — End: 2018-08-28
  Administered 2018-08-28: 23:00:00 via INTRAVENOUS

## 2018-08-28 MED ORDER — ONDANSETRON (PF) 4 MG/2 ML INJECTION
4 mg/2 mL | INTRAMUSCULAR | Status: AC
Start: 2018-08-28 — End: 2018-08-28
  Administered 2018-08-28: via INTRAVENOUS

## 2018-08-28 MED ORDER — ONDANSETRON (PF) 4 MG/2 ML INJECTION
4 mg/2 mL | INTRAMUSCULAR | Status: AC
Start: 2018-08-28 — End: 2018-08-28
  Administered 2018-08-28: 19:00:00 via INTRAVENOUS

## 2018-08-28 MED ORDER — IOPAMIDOL 61 % IV SOLN
300 mg iodine /mL (61 %) | Freq: Once | INTRAVENOUS | Status: AC
Start: 2018-08-28 — End: 2018-08-28
  Administered 2018-08-28: 22:00:00 via INTRAVENOUS

## 2018-08-28 MED FILL — MORPHINE 4 MG/ML SYRINGE: 4 mg/mL | INTRAMUSCULAR | Qty: 1

## 2018-08-28 MED FILL — ONDANSETRON (PF) 4 MG/2 ML INJECTION: 4 mg/2 mL | INTRAMUSCULAR | Qty: 2

## 2018-08-28 MED FILL — ISOVUE-300  61 % INTRAVENOUS SOLUTION: 300 mg iodine /mL (61 %) | INTRAVENOUS | Qty: 80

## 2018-08-28 NOTE — ED Notes (Signed)
Pt aware she is NPO at this time. Dr.Verma at bedside.

## 2018-08-28 NOTE — H&P (Signed)
Hospitalist Admission History and Physical    NAME:  Ashley [name]   DOB:   03-16-1985   MRN:   [id number]     PCP:  Roosevelt Locks Da, MD  Admission Date/Time:  08/28/2018 8:13 PM  Anticipated Date of Discharge: 08/31/2018  Anticipated Disposition (home, SNF) : HH         Assessment/Plan:      Active Problems:    Acute pancreatitis (08/28/2018)       Acute pancreatitis  Elevated LFT    Obesity status post gastric bypass    ___________________________________________________  PLAN:    Admit the patient to medicine    Patient was admitted few months ago and was found to have fluid collection inferior to pancreatic head which was found to be walled off abscess and was treated with antibiotics  Considering recurrent pain right upper quadrant ultrasound was done which showed unremarkable gallbladder but peripancreatic fluid collection which is stable compared to CT scan done in 07/2018  CT scan of the abdomen was done which was concerning for acute pancreatitis but no fluid collection noted and resolution of the pseudocyst has before  We will keep the patient n.p.o.  We will give another 1 L of IV fluids, hydration to continue till morning, repeat lipase in the morning  Troponins were done which are negative nonspecific changes on EKG      She continues to drink wine every night since discharge from last time but has not had any issues with food    Risk of deterioration:  []Low    [x]Moderate  []High    Prophylaxis:  []Lovenox  []Coumadin  [x]Hep SQ  []SCD???s  []H2B/PPI    Disposition:  []Home w/ Family   [x]HH PT,OT,RN   []SNF/LTC   []SAH/Rehab             Subjective:   CHIEF COMPLAINT:    Chief Complaint   Patient presents with   ??? Abdominal Pain   ??? Shortness of Breath       HISTORY OF PRESENT ILLNESS:     Ashley [name] is a 33 y.o. BLACK OR [place] female who presents with shortness of breath and chest pain  Patient has history of duodenal ulcer and perforation 5 months ago, gastric bypass has been having upper abdominal pain  similar to when she was admitted to the hospital in Jan  She was under the impression that she had pancreatitis, has had a fluid collection inferior to the pancreatic head which was likely considered to be pancreatic pseudocyst but later was felt to be from a ruptured ulcer with abscess and was seen by infectious disease, gastroenterology  Patient describes some chills but not had high-grade fever      Past Medical History:   Diagnosis Date   ??? Ill-defined condition     bronchitis   ??? Pneumonia         Past Surgical History:   Procedure Laterality Date   ??? HX CESAREAN SECTION  2002, 2004, 2008    x 3   ??? HX CESAREAN SECTION     ??? HX GASTRIC BYPASS  10-2009    [facility]   ??? HX GASTRIC BYPASS     ??? HX GASTRIC BYPASS         Social History     Tobacco Use   ??? Smoking status: Former Smoker     Types: Cigarettes     Last attempt to quit: 11/12/2009  Years since quitting: 8.7   ??? Smokeless tobacco: Never Used   Substance Use Topics   ??? Alcohol use: Not Currently     Alcohol/week: 0.0 - 4.0 standard drinks     Comment: occassional        Family History   Problem Relation Age of Onset   ??? Diabetes Mother    ??? Hypertension Father    ??? Heart Disease Maternal Grandmother    ??? Heart Attack Maternal Grandmother    ??? High Cholesterol Maternal Grandmother    ??? Arthritis-osteo Maternal Grandmother    ??? Stroke Paternal Aunt    ??? Kidney Disease Maternal Aunt         Allergies   Allergen Reactions   ??? Aspirin Other (comments)     Stomach ulcer   ??? Ibuprofen Unknown (comments)     Gastric Bypass--advised to avoid   ??? Nsaids (Non-Steroidal Anti-Inflammatory Drug) Other (comments)     Hx gastric bypass        Prior to Admission Medications   Prescriptions Last Dose Informant Patient Reported? Taking?   acetaminophen (TYLENOL) 325 mg tablet   No No   Sig: Take 2 Tabs by mouth every six (6) hours as needed for Pain.   dicyclomine (BENTYL) 20 mg tablet   No No   Sig: Take 1 Tab by mouth every six (6) hours as needed (abdominal cramps)  for up to 20 doses.   ferrous sulfate 325 mg (65 mg iron) tablet   No No   Sig: Take 1 Tab by mouth daily (with breakfast). Indications: anemia from inadequate iron   lidocaine (LIDODERM) 5 %   No No   Sig: Apply patch to the affected area for 12 hours a day and remove for 12 hours a day.   ondansetron (ZOFRAN ODT) 4 mg disintegrating tablet   No No   Sig: Take 1 Tab by mouth every eight (8) hours as needed for Nausea.      Facility-Administered Medications: None           REVIEW OF SYSTEMS:     [] Unable to obtain  ROS due to  []mental status change  []sedated   []intubated   [x]Total of 12 systems reviewed as follows:  Constitutional: negative fever, negative chills, negative weight loss  Eyes:   negative visual changes  ENT:   negative sore throat, tongue or lip swelling  Respiratory:  negative cough, negative dyspnea  Cards:  negative for chest pain, palpitations, lower extremity edema  GI:   Positive epigastric and upper abdominal pain  Genitourinary: negative for frequency, dysuria  Integument:  negative for rash and pruritus  Hematologic:  negative for easy bruising and gum/nose bleeding  Musculoskel: negative for myalgias,  back pain and muscle weakness  Neurological:  negative for headaches, dizziness, vertigo  Behavl/Psych: negative for feelings of anxiety, depression       Objective:   VITALS:    Visit Vitals  BP 114/66   Pulse 82   Temp 98 ??F (36.7 ??C)   Resp 15   Ht 5' (1.524 m)   Wt 84.8 kg (187 lb)   SpO2 98%   BMI 36.52 kg/m??     Temp (24hrs), Avg:98.4 ??F (36.9 ??C), Min:98 ??F (36.7 ??C), Max:98.8 ??F (37.1 ??C)      PHYSICAL EXAM: (Seen with PPE , gloves , gown , mask n95 and goggles )  General:    Alert, cooperative, no distress, appears stated age.  Head:   Normocephalic, without obvious abnormality, atraumatic.  Eyes:   Conjunctivae clear, anicteric sclerae.  Pupils are equal  Nose:  Nares normal. No drainage or sinus tenderness.  Throat:    Lips, mucosa, and tongue normal.  No  Thrush  Neck:  Supple, symmetrical,  no adenopathy, thyroid: non tender    no carotid bruit and no JVD.  Back:    Symmetric,  No CVA tenderness.  Lungs:   Clear to auscultation bilaterally.  No Wheezing or Rhonchi. No rales.  Chest wall:  No tenderness or deformity. No Accessory muscle use.  Heart:   Regular rate and rhythm,  no murmur, rub or gallop.  Abdomen:   Tenderness in the upper epigastric region, no guarding or rebound no signs of peritonitis  Extremities: Extremities normal, atraumatic, No cyanosis.  No edema. No clubbing  Skin:     Texture, turgor normal. No rashes or lesions.  Not Jaundiced  Psych:  Mildly anxious secondary to pain.  Neurologic: EOMs intact. No facial asymmetry. No aphasia or slurred speech. Normal  strength, Alert and oriented X 3.       LAB DATA REVIEWED:    Recent Results (from the past 12 hour(s))   CBC WITH AUTOMATED DIFF    Collection Time: 08/28/18  1:28 PM   Result Value Ref Range    WBC 8.3 4.0 - 11.0 1000/mm3    RBC 3.87 3.60 - 5.20 M/uL    HGB 12.8 (L) 13.0 - 17.2 gm/dl    HCT 38.0 37.0 - 50.0 %    MCV 98.2 (H) 80.0 - 98.0 fL    MCH 33.1 25.4 - 34.6 pg    MCHC 33.7 30.0 - 36.0 gm/dl    PLATELET 255 140 - 450 1000/mm3    MPV 10.7 (H) 6.0 - 10.0 fL    RDW-SD 54.8 (H) 36.4 - 46.3      NRBC 0 0 - 0      IMMATURE GRANULOCYTES 0.4 0.0 - 3.0 %    NEUTROPHILS 73.8 (H) 34 - 64 %    LYMPHOCYTES 19.1 (L) 28 - 48 %    MONOCYTES 6.4 1 - 13 %    EOSINOPHILS 0.2 0 - 5 %    BASOPHILS 0.1 0 - 3 %   METABOLIC PANEL, COMPREHENSIVE    Collection Time: 08/28/18  1:28 PM   Result Value Ref Range    Sodium 136 136 - 145 mEq/L    Potassium 3.7 3.5 - 5.1 mEq/L    Chloride 106 98 - 107 mEq/L    CO2 23 21 - 32 mEq/L    Glucose 71 (L) 74 - 106 mg/dl    BUN 7 7 - 25 mg/dl    Creatinine 0.8 0.6 - 1.3 mg/dl    GFR est AA >60.0      GFR est non-AA >60      Calcium 8.3 (L) 8.5 - 10.1 mg/dl    AST (SGOT) 72 (H) 15 - 37 U/L    ALT (SGPT) 61 12 - 78 U/L    Alk. phosphatase 164 (H) 45 - 117 U/L    Bilirubin,  total 0.5 0.2 - 1.0 mg/dl    Protein, total 7.3 6.4 - 8.2 gm/dl    Albumin 3.0 (L) 3.4 - 5.0 gm/dl    Anion gap 7 5 - 15 mmol/L   LIPASE    Collection Time: 08/28/18  1:28 PM   Result Value Ref Range    Lipase 396 (H)  73 - 393 U/L   TROPONIN I    Collection Time: 08/28/18  1:28 PM   Result Value Ref Range    Troponin-I <0.015 0.000 - 0.045 ng/ml   POC HCG,URINE    Collection Time: 08/28/18  1:40 PM   Result Value Ref Range    HCG urine, QL negative NEGATIVE,Negative,negative     EKG, 12 LEAD, INITIAL    Collection Time: 08/28/18  1:41 PM   Result Value Ref Range    Ventricular Rate 88 BPM    Atrial Rate 88 BPM    P-R Interval 140 ms    QRS Duration 82 ms    Q-T Interval 362 ms    QTC Calculation (Bezet) 438 ms    Calculated P Axis 53 degrees    Calculated R Axis 23 degrees    Calculated T Axis 23 degrees    Diagnosis       Normal sinus rhythm  Normal ECG  When compared with ECG of 19-Mar-2017 10:43,  No significant change since prior tracing  Confirmed by Marolyn Haller, M.D., Maeola Harman ([id number]) on 08/28/2018 5:54:53 PM     POC URINE MACROSCOPIC    Collection Time: 08/28/18  1:46 PM   Result Value Ref Range    Glucose Negative NEGATIVE,Negative mg/dl    Bilirubin Negative NEGATIVE,Negative      Ketone Negative NEGATIVE,Negative mg/dl    Specific gravity 1.025 1.005 - 1.030      Blood Negative NEGATIVE,Negative      pH (UA) 6.0 5 - 9      Protein Trace (A) NEGATIVE,Negative mg/dl    Urobilinogen 0.2 0.0 - 1.0 EU/dl    Nitrites Positive (A) NEGATIVE,Negative      Leukocyte Esterase Negative NEGATIVE,Negative      Color Dark yellow      Appearance Cloudy     POC URINE MICROSCOPIC    Collection Time: 08/28/18  1:46 PM   Result Value Ref Range    Epithelial cells, squamous 5-9 /LPF    WBC 1-4 /HPF    Bacteria 4+ /HPF   TROPONIN I    Collection Time: 08/28/18  4:30 PM   Result Value Ref Range    Troponin-I <0.015 0.000 - 0.045 ng/ml         IMAGING RESULTS:    Xr Chest Sngl V    Result Date: 08/28/2018  Indication: chest pain.    Marland Kitchen      IMPRESSION: Portable AP upright view the chest exposed at 1:44 PM August 28, 2018 reveals the lungs are clear and the heart is of normal size.Marland Kitchen  No change from March 19, 2017.     Ct Abd Pelv W Cont    Result Date: 08/28/2018  EXAMINATION: CT ABD PELV W CONT CLINICAL INDICATION: Upper abdominal pain     COMPARISON:  April 25, 2018 TECHNIQUE:  All CT exams at this facility use one or more dose reduction techniques including automatic exposure control, ma/kV  adjustment per patient's size, or iterative reconstruction technique. DICOM format image data available to non-affiliated external healthcare facilities or entities on a secure, media free, reciprocal searchable basis with patient authorization for 12 months following the date of the study. Multiple contiguous axial CT images at 5  mm increments were obtained from the bases of the lungs to the  pubic symphysis with intravenous contrast. FINDINGS: Visualized lungs/chest: Unremarkable. Gallbladder normal. No intrahepatic biliary duct dilatation. Hepatobiliary: Unremarkable. Gallbladder normal. Kidneys/ureters: Unremarkable. Stomach: Postop changes about the stomach. Inflammation  within the duodenal sweep. Adrenal glands: Unremarkable. Pancreas: Pancreatic head and uncinate process indistinct with adjacent inflammation. Pancreatic body and tail normal. No pancreatic duct dilatation. Previous pancreatic head pseudocyst has resolved. Spleen: Unremarkable. Vascular structures:  Unremarkable. Peritoneum/mesentery/bowel:  Unremarkable. Retroperitoneal lymph nodes: Unremarkable Pelvis:  Left adnexal cystic lesion 36 x 29 mm, new. Appendix normal     Bones: No significant lesions. Miscellaneous: None.     IMPRESSION: 1. Pancreatitis 2. Resolution of pancreatic head pseudocyst. 3. Left adnexal cystic lesion, likely ovarian cyst. Recommend follow-up pelvic ultrasound in 6 weeks to ensure resolution.     [place] Ruq    Result Date: 08/28/2018  History: RUQ pain.         Impression: Fatty liver. Small gallbladder polyp. Stable peripancreatic head 3 cm fluid collection. Comment: Sonography of the right upper quadrant was performed. This was compared with CT abdomen and pelvis April 25, 2018 and right upper quadrant ultrasound April 08, 2018. The liver is of mild increased echotexture from fatty infiltration. The intra and extrahepatic ducts are of normal caliber. The common bile duct measures 4 millimeters. A 6 mm polyp is noted adherent to the gallbladder wall. Otherwise the wall is of normal thickness. Sonographic Percell Miller sign is negative. No definite gallstones. No pericholecystic fluid. A 3.1 x 2.1 cm fluid collection is again noted anterior to the head of the pancreas unchanged from February 2020. tThe right kidney is unremarkable..         Care Plan discussed with:     [x]Patient   []Family    []ED Care Manager  []ED Doc   []Specialist :    Total care time spent on reviewing the case/data/notes/EMR,examining the patient,documentation,coordinating care with nurses and consultants is 40  minutes.       ___________________________________________________  Admitting Physician: Charlaine Dalton, MD     Dragon medical dictation software was used for portions of this report.  Unintended voice transcription errors may have occurred.

## 2018-08-28 NOTE — ED Notes (Signed)
Pt in low bed, no distress noted.

## 2018-08-28 NOTE — ED Provider Notes (Signed)
[facility]  Emergency Department Treatment Report    Patient: Ashley [name] Age: 33 y.o. Sex: female    Date of Birth: 1986/02/27 Admit Date: 08/28/2018 PCP: Roosevelt Locks Da, MD   MRN: [id number]  CSN: [id number]  ED Attending: Lindajo Royal, MD     Room: ER11/ER11 Time Dictated: 1:14 PM      Patient information was obtained from: patient  History/Exam limitations: none  Patient presented to the Emergency Department: private vehicle    Chief Complaint   Chief Complaint   Patient presents with   ??? Abdominal Pain   ??? Shortness of Breath       History of Present Illness   33 y.o. female with PMH of duodenal ulcer with perforation five months ago, gastric bypass, costochondritis who presents with acute 3 days of shortness of breath and chest pain after having intermittent upper abdominal pain for couple weeks.duration/onset.  Patient describes pain as similar to when she had issues with her abdominal 5 months ago.  Patient was under the impression that she had pancreatitis.  On her review of that admission in January, patient was found to have a fluid collection inferior to the pancreatic head measuring 3.3 x 2.4 cm which initially was considered as possible pancreatic pseudocyst but was later felt to be from a ruptured ulcer with abscess.  Patient complains of nausea without vomiting.  Normal bowel movements.  No cough.  Patient describes some chills sensation but has not measured her temperature at home.  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  Patient does not believe she has had any unprotected exposures to coronavirus.  Patient denies any urinary symptoms.  Review of Systems     Constitutional: No measured temperature, sensation of cold  Eyes: No visual symptoms or eye pain.  ENT: No sore throat, runny nose or ear pain.  Respiratory: No cough, dyspnea or wheezing.  Cardiovascular: Shortness of breath with precordial chest pain as described in HPI   gastrointestinal:  Nausea with abdominal pain as described in HPI  Genitourinary: No dysuria, frequency, or urgency.  Musculoskeletal: No joint pain or swelling.  Integumentary: No rashes.  Neurological: No headaches, sensory or motor symptoms.    Past Medical/Surgical History     Past Medical History:   Diagnosis Date   ??? Ill-defined condition     bronchitis   ??? Pneumonia      Past Surgical History:   Procedure Laterality Date   ??? HX CESAREAN SECTION  2002, 2004, 2008    x 3   ??? HX CESAREAN SECTION     ??? HX GASTRIC BYPASS  10-2009    [facility]   ??? HX GASTRIC BYPASS     ??? HX GASTRIC BYPASS         Social History     Social History     Socioeconomic History   ??? Marital status: SINGLE     Spouse name: Not on file   ??? Number of children: Not on file   ??? Years of education: Not on file   ??? Highest education level: Not on file   Tobacco Use   ??? Smoking status: Former Smoker     Types: Cigarettes     Last attempt to quit: 11/12/2009     Years since quitting: 8.7   ??? Smokeless tobacco: Never Used   Substance and Sexual Activity   ??? Alcohol use: Not Currently  Alcohol/week: 0.0 - 4.0 standard drinks     Comment: occassional   ??? Drug use: No   ??? Sexual activity: Yes     Partners: Male     Birth control/protection: None   Social History Narrative    ** Merged History Encounter **            Family History     Family History   Problem Relation Age of Onset   ??? Diabetes Mother    ??? Hypertension Father    ??? Heart Disease Maternal Grandmother    ??? Heart Attack Maternal Grandmother    ??? High Cholesterol Maternal Grandmother    ??? Arthritis-osteo Maternal Grandmother    ??? Stroke Paternal Aunt    ??? Kidney Disease Maternal Aunt        Current Medications     Prior to Admission Medications   Prescriptions Last Dose Informant Patient Reported? Taking?   acetaminophen (TYLENOL) 325 mg tablet   No No   Sig: Take 2 Tabs by mouth every six (6) hours as needed for Pain.   dicyclomine (BENTYL) 20 mg tablet   No No   Sig: Take 1 Tab by mouth every six (6)  hours as needed (abdominal cramps) for up to 20 doses.   ferrous sulfate 325 mg (65 mg iron) tablet   No No   Sig: Take 1 Tab by mouth daily (with breakfast). Indications: anemia from inadequate iron   lidocaine (LIDODERM) 5 %   No No   Sig: Apply patch to the affected area for 12 hours a day and remove for 12 hours a day.   ondansetron (ZOFRAN ODT) 4 mg disintegrating tablet   No No   Sig: Take 1 Tab by mouth every eight (8) hours as needed for Nausea.      Facility-Administered Medications: None       Allergies     Allergies   Allergen Reactions   ??? Aspirin Other (comments)     Stomach ulcer   ??? Ibuprofen Unknown (comments)     Gastric Bypass--advised to avoid   ??? Nsaids (Non-Steroidal Anti-Inflammatory Drug) Other (comments)     Hx gastric bypass       Physical Exam     ED Triage Vitals   ED Encounter Vitals Group      BP 08/28/18 1241 132/80      Pulse (Heart Rate) 08/28/18 1241 93      Resp Rate 08/28/18 1241 18      Temp 08/28/18 1241 98.8 ??F (37.1 ??C)      Temp src --       O2 Sat (%) 08/28/18 1241 100 %      Weight 08/28/18 1230 187 lb      Height 08/28/18 1230 5'       Constitutional: Patient appears well developed and well nourished. Marland Kitchen Appearance and behavior are age and situation appropriate.  HEENT: Conjunctiva clear. Mucous membranes moist, non-erythematous. No stridor.  Neck: supple, non tender, symmetrical.   Respiratory: lungs clear to auscultation, nonlabored respirations. No tachypnea or accessory muscle use.  Cardiovascular: heart regular rate and rhythm without murmur rubs or gallops.   Calves soft and non-tender. Distal pulses 2+ and equal bilaterally. Gastrointestinal:  Abdomen soft, tenderness epigastric.  No guarding no rebound.    Musculoskeletal: Joints no swelling  Integumentary: warm and dry without rashes or lesions  Neurologic: alert and oriented, Sensation intact, motor strength equal and symmetric.  No facial asymmetry or  dysarthria.    Impression and Management Plan   Patient  presents with chest pain and epigastric pain associated with some shortness of breath and nausea.  Pulmonary work-up initiated.  Ultrasound of the right upper quadrant will be checked for signs of cholecystitis.  Depending on labs and reexam, patient may require repeat CT of abdomen.    Prior medical records reviewed.  04/09/27 [place] RUQ showed cholelithiasis without signs of cholecysitis, subhepatic fluid collection 2.4x1.5x3 cm    COVID Exposure RIsk Screen  - Performed due to current COVID pandemic  Is the patient a healthcare worker?  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  Patient states that she has been wearing PPE throughout her work and feels that she has not had any unprotected exposures.  Patient had facemask.    PPE worn during exam:  N95 mask, googles  Diagnostic Studies   Lab:   Recent Results (from the past 12 hour(s))   CBC WITH AUTOMATED DIFF    Collection Time: 08/28/18  1:28 PM   Result Value Ref Range    WBC 8.3 4.0 - 11.0 1000/mm3    RBC 3.87 3.60 - 5.20 M/uL    HGB 12.8 (L) 13.0 - 17.2 gm/dl    HCT 38.0 37.0 - 50.0 %    MCV 98.2 (H) 80.0 - 98.0 fL    MCH 33.1 25.4 - 34.6 pg    MCHC 33.7 30.0 - 36.0 gm/dl    PLATELET 255 140 - 450 1000/mm3    MPV 10.7 (H) 6.0 - 10.0 fL    RDW-SD 54.8 (H) 36.4 - 46.3      NRBC 0 0 - 0      IMMATURE GRANULOCYTES 0.4 0.0 - 3.0 %    NEUTROPHILS 73.8 (H) 34 - 64 %    LYMPHOCYTES 19.1 (L) 28 - 48 %    MONOCYTES 6.4 1 - 13 %    EOSINOPHILS 0.2 0 - 5 %    BASOPHILS 0.1 0 - 3 %   METABOLIC PANEL, COMPREHENSIVE    Collection Time: 08/28/18  1:28 PM   Result Value Ref Range    Sodium 136 136 - 145 mEq/L    Potassium 3.7 3.5 - 5.1 mEq/L    Chloride 106 98 - 107 mEq/L    CO2 23 21 - 32 mEq/L    Glucose 71 (L) 74 - 106 mg/dl    BUN 7 7 - 25 mg/dl    Creatinine 0.8 0.6 - 1.3 mg/dl    GFR est AA >60.0      GFR est non-AA >60      Calcium 8.3 (L) 8.5 - 10.1 mg/dl    AST (SGOT) 72 (H) 15 - 37 U/L    ALT (SGPT) 61 12 - 78 U/L    Alk. phosphatase 164 (H) 45 -  117 U/L    Bilirubin, total 0.5 0.2 - 1.0 mg/dl    Protein, total 7.3 6.4 - 8.2 gm/dl    Albumin 3.0 (L) 3.4 - 5.0 gm/dl    Anion gap 7 5 - 15 mmol/L   LIPASE    Collection Time: 08/28/18  1:28 PM   Result Value Ref Range    Lipase 396 (H) 73 - 393 U/L   TROPONIN I    Collection Time: 08/28/18  1:28 PM   Result Value Ref Range    Troponin-I <0.015 0.000 - 0.045 ng/ml   POC HCG,URINE    Collection Time:  08/28/18  1:40 PM   Result Value Ref Range    HCG urine, QL negative NEGATIVE,Negative,negative     EKG, 12 LEAD, INITIAL    Collection Time: 08/28/18  1:41 PM   Result Value Ref Range    Ventricular Rate 88 BPM    Atrial Rate 88 BPM    P-R Interval 140 ms    QRS Duration 82 ms    Q-T Interval 362 ms    QTC Calculation (Bezet) 438 ms    Calculated P Axis 53 degrees    Calculated R Axis 23 degrees    Calculated T Axis 23 degrees    Diagnosis       Normal sinus rhythm  Normal ECG  When compared with ECG of 19-Mar-2017 10:43,  No significant change since prior tracing  Confirmed by Marolyn Haller, M.D., Maeola Harman ([id number]) on 08/28/2018 5:54:53 PM     POC URINE MACROSCOPIC    Collection Time: 08/28/18  1:46 PM   Result Value Ref Range    Glucose Negative NEGATIVE,Negative mg/dl    Bilirubin Negative NEGATIVE,Negative      Ketone Negative NEGATIVE,Negative mg/dl    Specific gravity 1.025 1.005 - 1.030      Blood Negative NEGATIVE,Negative      pH (UA) 6.0 5 - 9      Protein Trace (A) NEGATIVE,Negative mg/dl    Urobilinogen 0.2 0.0 - 1.0 EU/dl    Nitrites Positive (A) NEGATIVE,Negative      Leukocyte Esterase Negative NEGATIVE,Negative      Color Dark yellow      Appearance Cloudy     POC URINE MICROSCOPIC    Collection Time: 08/28/18  1:46 PM   Result Value Ref Range    Epithelial cells, squamous 5-9 /LPF    WBC 1-4 /HPF    Bacteria 4+ /HPF   TROPONIN I    Collection Time: 08/28/18  4:30 PM   Result Value Ref Range    Troponin-I <0.015 0.000 - 0.045 ng/ml         Xr Chest Sngl V    Result Date: 08/28/2018  Indication: chest pain.    Marland Kitchen      IMPRESSION: Portable AP upright view the chest exposed at 1:44 PM August 28, 2018 reveals the lungs are clear and the heart is of normal size.Marland Kitchen  No change from March 19, 2017.     Ct Abd Pelv W Cont    Result Date: 08/28/2018  EXAMINATION: CT ABD PELV W CONT CLINICAL INDICATION: Upper abdominal pain     COMPARISON:  April 25, 2018 TECHNIQUE:  All CT exams at this facility use one or more dose reduction techniques including automatic exposure control, ma/kV  adjustment per patient's size, or iterative reconstruction technique. DICOM format image data available to non-affiliated external healthcare facilities or entities on a secure, media free, reciprocal searchable basis with patient authorization for 12 months following the date of the study. Multiple contiguous axial CT images at 5  mm increments were obtained from the bases of the lungs to the  pubic symphysis with intravenous contrast. FINDINGS: Visualized lungs/chest: Unremarkable. Gallbladder normal. No intrahepatic biliary duct dilatation. Hepatobiliary: Unremarkable. Gallbladder normal. Kidneys/ureters: Unremarkable. Stomach: Postop changes about the stomach. Inflammation within the duodenal sweep. Adrenal glands: Unremarkable. Pancreas: Pancreatic head and uncinate process indistinct with adjacent inflammation. Pancreatic body and tail normal. No pancreatic duct dilatation. Previous pancreatic head pseudocyst has resolved. Spleen: Unremarkable. Vascular structures:  Unremarkable. Peritoneum/mesentery/bowel:  Unremarkable. Retroperitoneal lymph nodes: Unremarkable Pelvis:  Left adnexal cystic lesion 36 x 29 mm, new. Appendix normal     Bones: No significant lesions. Miscellaneous: None.     IMPRESSION: 1. Pancreatitis 2. Resolution of pancreatic head pseudocyst. 3. Left adnexal cystic lesion, likely ovarian cyst. Recommend follow-up pelvic ultrasound in 6 weeks to ensure resolution.     [place] Ruq    Result Date: 08/28/2018  History: RUQ pain.         Impression: Fatty liver. Small gallbladder polyp. Stable peripancreatic head 3 cm fluid collection. Comment: Sonography of the right upper quadrant was performed. This was compared with CT abdomen and pelvis April 25, 2018 and right upper quadrant ultrasound April 08, 2018. The liver is of mild increased echotexture from fatty infiltration. The intra and extrahepatic ducts are of normal caliber. The common bile duct measures 4 millimeters. A 6 mm polyp is noted adherent to the gallbladder wall. Otherwise the wall is of normal thickness. Sonographic Percell Miller sign is negative. No definite gallstones. No pericholecystic fluid. A 3.1 x 2.1 cm fluid collection is again noted anterior to the head of the pancreas unchanged from February 2020. tThe right kidney is unremarkable..       EKG: Preliminary EKG interpreted by myself, Truddie Crumble MD, shows normal sinus rhythm.  Nonspecific EKG changes noted but no signs of acute infarct.       Medical Decision Making/ED Course   Patient's labs shows that urinalysis is positive for nitrates.  5-9 epithelial cells with 4+ bacteria on micro.  Pregnancy test negative.  CBC stable.   Electrolytes returned unremarkable.  AST elevated at 72 with normal ALT at 61 and normal bilirubin at 0.5.  Lipase 396.  Troponin pending.  Repeat 3-hour troponin ordered for complaints of chest pain.  Patient currently at ultrasound right upper quadrant.  Care signed out to Dr. Ysidro Evert to recheck patient and ultrasound results for final disposition.      7:26 PM  Continuation by Thana Farr. Ysidro Evert, MD.  Patient signed out to me by Dr. Rigoberto Noel pending ultrasound and completion of labs.  Patient presents with upper abdominal pain.  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  Admission in the hospital 5 months ago secondary to upper abdominal pain found to have fluid collection inferior to pancreatic head which was initially thought to represent pseudocyst.  Later thought to be secondary to ruptured duodenal  ulcer with walled off abscess.  She was treated with antibiotics and supportively.  She returns today with recurrent pain.  Right upper quadrant ultrasound shows unremarkable gallbladder but with peripancreatic fluid collection noted to be stable compared to CT scan of February 2020.  CT abdomen pelvis was performed and is concerning for acute pancreatitis.  No fluid collection is noted and resolution of "pseudocyst" is reported.  Discussed with Dr. Laren Boom [place] who believes that fluid seen on ultrasound is simply fluid seen on duodenal sweep.  No abscess identified.  Lipase is borderline high but with inflammation seen at pancreatic head will treat as acute pancreatitis admit to hospitalist for bowel rest, IV fluids and supportive care.  Discussed with Dr. Winona Legato for admission.    Medications   ondansetron (ZOFRAN) injection 4 mg (4 mg IntraVENous Given 08/28/18 1503)   iopamidoL (ISOVUE 300) 61 % contrast injection 80 mL (80 mL IntraVENous Given 08/28/18 1748)   morphine injection 4 mg (4 mg IntraVENous Given 08/28/18 1832)     Final Diagnosis       ICD-10-CM ICD-9-CM   1. Abdominal  pain, epigastric R10.13 789.06   2. Acute pancreatitis, unspecified complication status, unspecified pancreatitis type K85.90 577.0     Disposition   Admit      Truddie Crumble MD  August 28, 2018     Thana Farr. Ysidro Evert, MD  August 28, 2018    My signature above authenticates this document and my orders, the final    diagnosis (es), discharge prescription (s), and instructions in the Epic    record.    Nursing notes have been reviewed by the physician.    Please note that portions of this note were completed with a voice recognition program Dragon.  Efforts were made to edit the dictations but words may be mis-transcribed.

## 2018-08-28 NOTE — ED Notes (Signed)
Complains of abdomen pain, points to right side that radiates to her back x 2 weeks.  Patient with hx of pancreatitis.  Complains of shortness of breath also, and pain when she takes a deep breathe, x 2 days

## 2018-08-28 NOTE — ED Notes (Signed)
Report from Ethan,RN.

## 2018-08-28 NOTE — ED Notes (Signed)
 TRANSFER - OUT REPORT:    Verbal report given to Roseanne, RN (name) on Ashley [name]  being transferred to [facility] (unit) for routine progression of care       Report consisted of patient's Situation, Background, Assessment and   Recommendations(SBAR).     Information from the following report(s) SBAR was reviewed with the receiving nurse.    Lines:   Peripheral IV 08/28/18 Right Antecubital (Active)        Opportunity for questions and clarification was provided.      Patient transported with:   Registered Nurse

## 2018-08-28 NOTE — ED Notes (Signed)
Trop sent to lab

## 2018-08-28 NOTE — ED Notes (Signed)
Trop sent to lab.

## 2018-08-28 NOTE — ED Triage Notes (Signed)
Complains of abdomen pain, points to right side that radiates to her back x 2 weeks.  Patient with hx of pancreatitis.  Complains of shortness of breath also, and pain when she takes a deep breathe, x 2 days

## 2018-08-28 NOTE — H&P (Addendum)
Hospitalist Admission History and Physical    NAME:  Ashley [name]   DOB:   1986-03-04   MRN:   [id number]     PCP:  Roosevelt Locks Da, MD  Admission Date/Time:  08/28/2018 8:13 PM  Anticipated Date of Discharge: 08/31/2018  Anticipated Disposition (home, SNF) : HH         Assessment/Plan:      Active Problems:    Acute pancreatitis (08/28/2018)       Acute pancreatitis  Elevated LFT    Obesity status post gastric bypass    ___________________________________________________  PLAN:    Admit the patient to medicine    Patient was admitted few months ago and was found to have fluid collection inferior to pancreatic head which was found to be walled off abscess and was treated with antibiotics  Considering recurrent pain right upper quadrant ultrasound was done which showed unremarkable gallbladder but peripancreatic fluid collection which is stable compared to CT scan done in 07/2018  CT scan of the abdomen was done which was concerning for acute pancreatitis but no fluid collection noted and resolution of the pseudocyst has before  We will keep the patient n.p.o.  We will give another 1 L of IV fluids, hydration to continue till morning, repeat lipase in the morning  Troponins were done which are negative nonspecific changes on EKG      She continues to drink wine every night since discharge from last time but has not had any issues with food    Risk of deterioration:  [] Low    [x] Moderate  [] High    Prophylaxis:  [] Lovenox  [] Coumadin  [x] Hep SQ  [] SCD???s  [] H2B/PPI    Disposition:  [] Home w/ Family   [x] HH PT,OT,RN   [] SNF/LTC   [] SAH/Rehab             Subjective:   CHIEF COMPLAINT:    Chief Complaint   Patient presents with   ??? Abdominal Pain   ??? Shortness of Breath       HISTORY OF PRESENT ILLNESS:     Ashley [name] is a 33 y.o. BLACK OR [place] female who presents with shortness of breath and chest pain  Patient has history of duodenal ulcer and perforation 5 months ago,  gastric bypass has been having upper abdominal pain similar to when she was admitted to the hospital in Jan  She was under the impression that she had pancreatitis, has had a fluid collection inferior to the pancreatic head which was likely considered to be pancreatic pseudocyst but later was felt to be from a ruptured ulcer with abscess and was seen by infectious disease, gastroenterology  Patient describes some chills but not had high-grade fever      Past Medical History:   Diagnosis Date   ??? Ill-defined condition     bronchitis   ??? Pneumonia         Past Surgical History:   Procedure Laterality Date   ??? HX CESAREAN SECTION  2002, 2004, 2008    x 3   ??? HX CESAREAN SECTION     ??? HX GASTRIC BYPASS  10-2009    [facility] [place]   ??? HX GASTRIC BYPASS     ??? HX GASTRIC BYPASS         Social History     Tobacco Use   ??? Smoking status: Former Smoker     Types: Cigarettes     Last attempt to quit: 11/12/2009  Years since quitting: 8.7   ??? Smokeless tobacco: Never Used   Substance Use Topics   ??? Alcohol use: Not Currently     Alcohol/week: 0.0 - 4.0 standard drinks     Comment: occassional        Family History   Problem Relation Age of Onset   ??? Diabetes Mother    ??? Hypertension Father    ??? Heart Disease Maternal Grandmother    ??? Heart Attack Maternal Grandmother    ??? High Cholesterol Maternal Grandmother    ??? Arthritis-osteo Maternal Grandmother    ??? Stroke Paternal Aunt    ??? Kidney Disease Maternal Aunt         Allergies   Allergen Reactions   ??? Aspirin Other (comments)     Stomach ulcer   ??? Ibuprofen Unknown (comments)     Gastric Bypass--advised to avoid   ??? Nsaids (Non-Steroidal Anti-Inflammatory Drug) Other (comments)     Hx gastric bypass        Prior to Admission Medications   Prescriptions Last Dose Informant Patient Reported? Taking?   acetaminophen (TYLENOL) 325 mg tablet   No No   Sig: Take 2 Tabs by mouth every six (6) hours as needed for Pain.   dicyclomine (BENTYL) 20 mg tablet   No No    Sig: Take 1 Tab by mouth every six (6) hours as needed (abdominal cramps) for up to 20 doses.   ferrous sulfate 325 mg (65 mg iron) tablet   No No   Sig: Take 1 Tab by mouth daily (with breakfast). Indications: anemia from inadequate iron   lidocaine (LIDODERM) 5 %   No No   Sig: Apply patch to the affected area for 12 hours a day and remove for 12 hours a day.   ondansetron (ZOFRAN ODT) 4 mg disintegrating tablet   No No   Sig: Take 1 Tab by mouth every eight (8) hours as needed for Nausea.      Facility-Administered Medications: None           REVIEW OF SYSTEMS:     []  Unable to obtain  ROS due to  [] mental status change  [] sedated   [] intubated   [x] Total of 12 systems reviewed as follows:  Constitutional: negative fever, negative chills, negative weight loss  Eyes:   negative visual changes  ENT:   negative sore throat, tongue or lip swelling  Respiratory:  negative cough, negative dyspnea  Cards:  negative for chest pain, palpitations, lower extremity edema  GI:   Positive epigastric and upper abdominal pain  Genitourinary: negative for frequency, dysuria  Integument:  negative for rash and pruritus  Hematologic:  negative for easy bruising and gum/nose bleeding  Musculoskel: negative for myalgias,  back pain and muscle weakness  Neurological:  negative for headaches, dizziness, vertigo  Behavl/Psych: negative for feelings of anxiety, depression       Objective:   VITALS:    Visit Vitals  BP 114/66   Pulse 82   Temp 98 ??F (36.7 ??C)   Resp 15   Ht 5' (1.524 m)   Wt 84.8 kg (187 lb)   SpO2 98%   BMI 36.52 kg/m??     Temp (24hrs), Avg:98.4 ??F (36.9 ??C), Min:98 ??F (36.7 ??C), Max:98.8 ??F (37.1 ??C)      PHYSICAL EXAM: (Seen with PPE , gloves , gown , mask n95 and goggles )  General:    Alert, cooperative, no distress, appears stated age.  Head:   Normocephalic, without obvious abnormality, atraumatic.  Eyes:   Conjunctivae clear, anicteric sclerae.  Pupils are equal   Nose:  Nares normal. No drainage or sinus tenderness.  Throat:    Lips, mucosa, and tongue normal.  No Thrush  Neck:  Supple, symmetrical,  no adenopathy, thyroid: non tender    no carotid bruit and no JVD.  Back:    Symmetric,  No CVA tenderness.  Lungs:   Clear to auscultation bilaterally.  No Wheezing or Rhonchi. No rales.  Chest wall:  No tenderness or deformity. No Accessory muscle use.  Heart:   Regular rate and rhythm,  no murmur, rub or gallop.  Abdomen:   Tenderness in the upper epigastric region, no guarding or rebound no signs of peritonitis  Extremities: Extremities normal, atraumatic, No cyanosis.  No edema. No clubbing  Skin:     Texture, turgor normal. No rashes or lesions.  Not Jaundiced  Psych:  Mildly anxious secondary to pain.  Neurologic: EOMs intact. No facial asymmetry. No aphasia or slurred speech. Normal  strength, Alert and oriented X 3.       LAB DATA REVIEWED:    Recent Results (from the past 12 hour(s))   CBC WITH AUTOMATED DIFF    Collection Time: 08/28/18  1:28 PM   Result Value Ref Range    WBC 8.3 4.0 - 11.0 1000/mm3    RBC 3.87 3.60 - 5.20 M/uL    HGB 12.8 (L) 13.0 - 17.2 gm/dl    HCT 38.0 37.0 - 50.0 %    MCV 98.2 (H) 80.0 - 98.0 fL    MCH 33.1 25.4 - 34.6 pg    MCHC 33.7 30.0 - 36.0 gm/dl    PLATELET 255 140 - 450 1000/mm3    MPV 10.7 (H) 6.0 - 10.0 fL    RDW-SD 54.8 (H) 36.4 - 46.3      NRBC 0 0 - 0      IMMATURE GRANULOCYTES 0.4 0.0 - 3.0 %    NEUTROPHILS 73.8 (H) 34 - 64 %    LYMPHOCYTES 19.1 (L) 28 - 48 %    MONOCYTES 6.4 1 - 13 %    EOSINOPHILS 0.2 0 - 5 %    BASOPHILS 0.1 0 - 3 %   METABOLIC PANEL, COMPREHENSIVE    Collection Time: 08/28/18  1:28 PM   Result Value Ref Range    Sodium 136 136 - 145 mEq/L    Potassium 3.7 3.5 - 5.1 mEq/L    Chloride 106 98 - 107 mEq/L    CO2 23 21 - 32 mEq/L    Glucose 71 (L) 74 - 106 mg/dl    BUN 7 7 - 25 mg/dl    Creatinine 0.8 0.6 - 1.3 mg/dl    GFR est AA >60.0      GFR est non-AA >60      Calcium 8.3 (L) 8.5 - 10.1 mg/dl     AST (SGOT) 72 (H) 15 - 37 U/L    ALT (SGPT) 61 12 - 78 U/L    Alk. phosphatase 164 (H) 45 - 117 U/L    Bilirubin, total 0.5 0.2 - 1.0 mg/dl    Protein, total 7.3 6.4 - 8.2 gm/dl    Albumin 3.0 (L) 3.4 - 5.0 gm/dl    Anion gap 7 5 - 15 mmol/L   LIPASE    Collection Time: 08/28/18  1:28 PM   Result Value Ref Range    Lipase 396 (H)  73 - 393 U/L   TROPONIN I    Collection Time: 08/28/18  1:28 PM   Result Value Ref Range    Troponin-I <0.015 0.000 - 0.045 ng/ml   POC HCG,URINE    Collection Time: 08/28/18  1:40 PM   Result Value Ref Range    HCG urine, QL negative NEGATIVE,Negative,negative     EKG, 12 LEAD, INITIAL    Collection Time: 08/28/18  1:41 PM   Result Value Ref Range    Ventricular Rate 88 BPM    Atrial Rate 88 BPM    P-R Interval 140 ms    QRS Duration 82 ms    Q-T Interval 362 ms    QTC Calculation (Bezet) 438 ms    Calculated P Axis 53 degrees    Calculated R Axis 23 degrees    Calculated T Axis 23 degrees    Diagnosis       Normal sinus rhythm  Normal ECG  When compared with ECG of 19-Mar-2017 10:43,  No significant change since prior tracing  Confirmed by Marolyn Haller, M.D., Maeola Harman ([id number]) on 08/28/2018 5:54:53 PM     POC URINE MACROSCOPIC    Collection Time: 08/28/18  1:46 PM   Result Value Ref Range    Glucose Negative NEGATIVE,Negative mg/dl    Bilirubin Negative NEGATIVE,Negative      Ketone Negative NEGATIVE,Negative mg/dl    Specific gravity 1.025 1.005 - 1.030      Blood Negative NEGATIVE,Negative      pH (UA) 6.0 5 - 9      Protein Trace (A) NEGATIVE,Negative mg/dl    Urobilinogen 0.2 0.0 - 1.0 EU/dl    Nitrites Positive (A) NEGATIVE,Negative      Leukocyte Esterase Negative NEGATIVE,Negative      Color Dark yellow      Appearance Cloudy     POC URINE MICROSCOPIC    Collection Time: 08/28/18  1:46 PM   Result Value Ref Range    Epithelial cells, squamous 5-9 /LPF    WBC 1-4 /HPF    Bacteria 4+ /HPF   TROPONIN I    Collection Time: 08/28/18  4:30 PM   Result Value Ref Range     Troponin-I <0.015 0.000 - 0.045 ng/ml         IMAGING RESULTS:    Xr Chest Sngl V    Result Date: 08/28/2018  Indication: chest pain.    Marland Kitchen     IMPRESSION: Portable AP upright view the chest exposed at 1:44 PM August 28, 2018 reveals the lungs are clear and the heart is of normal size.Marland Kitchen  No change from March 19, 2017.     Ct Abd Pelv W Cont    Result Date: 08/28/2018  EXAMINATION: CT ABD PELV W CONT CLINICAL INDICATION: Upper abdominal pain     COMPARISON:  April 25, 2018 TECHNIQUE:  All CT exams at this facility use one or more dose reduction techniques including automatic exposure control, ma/kV  adjustment per patient's size, or iterative reconstruction technique. DICOM format image data available to non-affiliated external healthcare facilities or entities on a secure, media free, reciprocal searchable basis with patient authorization for 12 months following the date of the study. Multiple contiguous axial CT images at 5  mm increments were obtained from the bases of the lungs to the  pubic symphysis with intravenous contrast. FINDINGS: Visualized lungs/chest: Unremarkable. Gallbladder normal. No intrahepatic biliary duct dilatation. Hepatobiliary: Unremarkable. Gallbladder normal. Kidneys/ureters: Unremarkable. Stomach: Postop changes about the stomach. Inflammation  within the duodenal sweep. Adrenal glands: Unremarkable. Pancreas: Pancreatic head and uncinate process indistinct with adjacent inflammation. Pancreatic body and tail normal. No pancreatic duct dilatation. Previous pancreatic head pseudocyst has resolved. Spleen: Unremarkable. Vascular structures:  Unremarkable. Peritoneum/mesentery/bowel:  Unremarkable. Retroperitoneal lymph nodes: Unremarkable Pelvis:  Left adnexal cystic lesion 36 x 29 mm, new. Appendix normal     Bones: No significant lesions. Miscellaneous: None.     IMPRESSION: 1. Pancreatitis 2. Resolution of pancreatic head pseudocyst.  3. Left adnexal cystic lesion, likely ovarian cyst. Recommend follow-up pelvic ultrasound in 6 weeks to ensure resolution.     [place] Ruq    Result Date: 08/28/2018  History: RUQ pain.        Impression: Fatty liver. Small gallbladder polyp. Stable peripancreatic head 3 cm fluid collection. Comment: Sonography of the right upper quadrant was performed. This was compared with CT abdomen and pelvis April 25, 2018 and right upper quadrant ultrasound April 08, 2018. The liver is of mild increased echotexture from fatty infiltration. The intra and extrahepatic ducts are of normal caliber. The common bile duct measures 4 millimeters. A 6 mm polyp is noted adherent to the gallbladder wall. Otherwise the wall is of normal thickness. Sonographic Percell Miller sign is negative. No definite gallstones. No pericholecystic fluid. A 3.1 x 2.1 cm fluid collection is again noted anterior to the head of the pancreas unchanged from February 2020. tThe right kidney is unremarkable..         Care Plan discussed with:     [x] Patient   [] Family    [] ED Care Manager  [] ED Doc   [] Specialist :    Total care time spent on reviewing the case/data/notes/EMR,examining the patient,documentation,coordinating care with nurses and consultants is 40  minutes.       ___________________________________________________  Admitting Physician: Charlaine Dalton, MD     Dragon medical dictation software was used for portions of this report.  Unintended voice transcription errors may have occurred.

## 2018-08-28 NOTE — ED Notes (Signed)
Pt aware she is NPO at this time. Dr.Verma at bedside.

## 2018-08-28 NOTE — Other (Signed)
TRANSFER - IN REPORT:    Verbal report received from Sarah RN (name) on Ashley [name]  being received from ED(unit) for routine progression of care      Report consisted of patient???s Situation, Background, Assessment and   Recommendations(SBAR).     Information from the following report(s) SBAR, Kardex, ED Summary, Intake/Output, MAR and Recent Results was reviewed with the receiving nurse.    Opportunity for questions and clarification was provided.      Assessment completed upon patient???s arrival to unit and care assumed.

## 2018-08-28 NOTE — ED Provider Notes (Signed)
[facility]  Emergency Department Treatment Report    Patient: Ashley [name] Age: 33 y.o. Sex: female    Date of Birth: 1985-08-28 Admit Date: 08/28/2018 PCP: Roosevelt Locks Da, MD   MRN: [id number]  CSN: [id number]  ED Attending: Lindajo Royal, MD     Room: ER11/ER11 Time Dictated: 1:14 PM      Patient information was obtained from: patient  History/Exam limitations: none  Patient presented to the Emergency Department: private vehicle    Chief Complaint   Chief Complaint   Patient presents with   ??? Abdominal Pain   ??? Shortness of Breath       History of Present Illness   33 y.o. female with PMH of duodenal ulcer with perforation five months ago, gastric bypass, costochondritis who presents with acute 3 days of shortness of breath and chest pain after having intermittent upper abdominal pain for couple weeks.duration/onset.  Patient describes pain as similar to when she had issues with her abdominal 5 months ago.  Patient was under the impression that she had pancreatitis.  On her review of that admission in January, patient was found to have a fluid collection inferior to the pancreatic head measuring 3.3 x 2.4 cm which initially was considered as possible pancreatic pseudocyst but was later felt to be from a ruptured ulcer with abscess.  Patient complains of nausea without vomiting.  Normal bowel movements.  No cough.  Patient describes some chills sensation but has not measured her temperature at home.  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  Patient does not believe she has had any unprotected exposures to coronavirus.  Patient denies any urinary symptoms.  Review of Systems     Constitutional: No measured temperature, sensation of cold  Eyes: No visual symptoms or eye pain.  ENT: No sore throat, runny nose or ear pain.  Respiratory: No cough, dyspnea or wheezing.  Cardiovascular: Shortness of breath with precordial chest pain as described in HPI    gastrointestinal: Nausea with abdominal pain as described in HPI  Genitourinary: No dysuria, frequency, or urgency.  Musculoskeletal: No joint pain or swelling.  Integumentary: No rashes.  Neurological: No headaches, sensory or motor symptoms.    Past Medical/Surgical History     Past Medical History:   Diagnosis Date   ??? Ill-defined condition     bronchitis   ??? Pneumonia      Past Surgical History:   Procedure Laterality Date   ??? HX CESAREAN SECTION  2002, 2004, 2008    x 3   ??? HX CESAREAN SECTION     ??? HX GASTRIC BYPASS  10-2009    [facility]   ??? HX GASTRIC BYPASS     ??? HX GASTRIC BYPASS         Social History     Social History     Socioeconomic History   ??? Marital status: SINGLE     Spouse name: Not on file   ??? Number of children: Not on file   ??? Years of education: Not on file   ??? Highest education level: Not on file   Tobacco Use   ??? Smoking status: Former Smoker     Types: Cigarettes     Last attempt to quit: 11/12/2009     Years since quitting: 8.7   ??? Smokeless tobacco: Never Used   Substance and Sexual Activity   ??? Alcohol use: Not Currently  Alcohol/week: 0.0 - 4.0 standard drinks     Comment: occassional   ??? Drug use: No   ??? Sexual activity: Yes     Partners: Male     Birth control/protection: None   Social History Narrative    ** Merged History Encounter **            Family History     Family History   Problem Relation Age of Onset   ??? Diabetes Mother    ??? Hypertension Father    ??? Heart Disease Maternal Grandmother    ??? Heart Attack Maternal Grandmother    ??? High Cholesterol Maternal Grandmother    ??? Arthritis-osteo Maternal Grandmother    ??? Stroke Paternal Aunt    ??? Kidney Disease Maternal Aunt        Current Medications     Prior to Admission Medications   Prescriptions Last Dose Informant Patient Reported? Taking?   acetaminophen (TYLENOL) 325 mg tablet   No No   Sig: Take 2 Tabs by mouth every six (6) hours as needed for Pain.   dicyclomine (BENTYL) 20 mg tablet   No No    Sig: Take 1 Tab by mouth every six (6) hours as needed (abdominal cramps) for up to 20 doses.   ferrous sulfate 325 mg (65 mg iron) tablet   No No   Sig: Take 1 Tab by mouth daily (with breakfast). Indications: anemia from inadequate iron   lidocaine (LIDODERM) 5 %   No No   Sig: Apply patch to the affected area for 12 hours a day and remove for 12 hours a day.   ondansetron (ZOFRAN ODT) 4 mg disintegrating tablet   No No   Sig: Take 1 Tab by mouth every eight (8) hours as needed for Nausea.      Facility-Administered Medications: None       Allergies     Allergies   Allergen Reactions   ??? Aspirin Other (comments)     Stomach ulcer   ??? Ibuprofen Unknown (comments)     Gastric Bypass--advised to avoid   ??? Nsaids (Non-Steroidal Anti-Inflammatory Drug) Other (comments)     Hx gastric bypass       Physical Exam     ED Triage Vitals   ED Encounter Vitals Group      BP 08/28/18 1241 132/80      Pulse (Heart Rate) 08/28/18 1241 93      Resp Rate 08/28/18 1241 18      Temp 08/28/18 1241 98.8 ??F (37.1 ??C)      Temp src --       O2 Sat (%) 08/28/18 1241 100 %      Weight 08/28/18 1230 187 lb      Height 08/28/18 1230 5'       Constitutional: Patient appears well developed and well nourished. Marland Kitchen Appearance and behavior are age and situation appropriate.  HEENT: Conjunctiva clear. Mucous membranes moist, non-erythematous. No stridor.  Neck: supple, non tender, symmetrical.   Respiratory: lungs clear to auscultation, nonlabored respirations. No tachypnea or accessory muscle use.  Cardiovascular: heart regular rate and rhythm without murmur rubs or gallops.   Calves soft and non-tender. Distal pulses 2+ and equal bilaterally. Gastrointestinal:  Abdomen soft, tenderness epigastric.  No guarding no rebound.    Musculoskeletal: Joints no swelling  Integumentary: warm and dry without rashes or lesions  Neurologic: alert and oriented, Sensation intact, motor strength equal and symmetric.  No facial asymmetry or  dysarthria.     Impression and Management Plan   Patient presents with chest pain and epigastric pain associated with some shortness of breath and nausea.  Pulmonary work-up initiated.  Ultrasound of the right upper quadrant will be checked for signs of cholecystitis.  Depending on labs and reexam, patient may require repeat CT of abdomen.    Prior medical records reviewed.  04/09/27 [place] RUQ showed cholelithiasis without signs of cholecysitis, subhepatic fluid collection 2.4x1.5x3 cm    COVID Exposure RIsk Screen  - Performed due to current COVID pandemic  Is the patient a healthcare worker?  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  Patient states that she has been wearing PPE throughout her work and feels that she has not had any unprotected exposures.  Patient had facemask.    PPE worn during exam:  N95 mask, googles  Diagnostic Studies   Lab:   Recent Results (from the past 12 hour(s))   CBC WITH AUTOMATED DIFF    Collection Time: 08/28/18  1:28 PM   Result Value Ref Range    WBC 8.3 4.0 - 11.0 1000/mm3    RBC 3.87 3.60 - 5.20 M/uL    HGB 12.8 (L) 13.0 - 17.2 gm/dl    HCT 38.0 37.0 - 50.0 %    MCV 98.2 (H) 80.0 - 98.0 fL    MCH 33.1 25.4 - 34.6 pg    MCHC 33.7 30.0 - 36.0 gm/dl    PLATELET 255 140 - 450 1000/mm3    MPV 10.7 (H) 6.0 - 10.0 fL    RDW-SD 54.8 (H) 36.4 - 46.3      NRBC 0 0 - 0      IMMATURE GRANULOCYTES 0.4 0.0 - 3.0 %    NEUTROPHILS 73.8 (H) 34 - 64 %    LYMPHOCYTES 19.1 (L) 28 - 48 %    MONOCYTES 6.4 1 - 13 %    EOSINOPHILS 0.2 0 - 5 %    BASOPHILS 0.1 0 - 3 %   METABOLIC PANEL, COMPREHENSIVE    Collection Time: 08/28/18  1:28 PM   Result Value Ref Range    Sodium 136 136 - 145 mEq/L    Potassium 3.7 3.5 - 5.1 mEq/L    Chloride 106 98 - 107 mEq/L    CO2 23 21 - 32 mEq/L    Glucose 71 (L) 74 - 106 mg/dl    BUN 7 7 - 25 mg/dl    Creatinine 0.8 0.6 - 1.3 mg/dl    GFR est AA >60.0      GFR est non-AA >60      Calcium 8.3 (L) 8.5 - 10.1 mg/dl    AST (SGOT) 72 (H) 15 - 37 U/L     ALT (SGPT) 61 12 - 78 U/L    Alk. phosphatase 164 (H) 45 - 117 U/L    Bilirubin, total 0.5 0.2 - 1.0 mg/dl    Protein, total 7.3 6.4 - 8.2 gm/dl    Albumin 3.0 (L) 3.4 - 5.0 gm/dl    Anion gap 7 5 - 15 mmol/L   LIPASE    Collection Time: 08/28/18  1:28 PM   Result Value Ref Range    Lipase 396 (H) 73 - 393 U/L   TROPONIN I    Collection Time: 08/28/18  1:28 PM   Result Value Ref Range    Troponin-I <0.015 0.000 - 0.045 ng/ml   POC HCG,URINE    Collection Time:  08/28/18  1:40 PM   Result Value Ref Range    HCG urine, QL negative NEGATIVE,Negative,negative     EKG, 12 LEAD, INITIAL    Collection Time: 08/28/18  1:41 PM   Result Value Ref Range    Ventricular Rate 88 BPM    Atrial Rate 88 BPM    P-R Interval 140 ms    QRS Duration 82 ms    Q-T Interval 362 ms    QTC Calculation (Bezet) 438 ms    Calculated P Axis 53 degrees    Calculated R Axis 23 degrees    Calculated T Axis 23 degrees    Diagnosis       Normal sinus rhythm  Normal ECG  When compared with ECG of 19-Mar-2017 10:43,  No significant change since prior tracing  Confirmed by Marolyn Haller, M.D., Maeola Harman ([id number]) on 08/28/2018 5:54:53 PM     POC URINE MACROSCOPIC    Collection Time: 08/28/18  1:46 PM   Result Value Ref Range    Glucose Negative NEGATIVE,Negative mg/dl    Bilirubin Negative NEGATIVE,Negative      Ketone Negative NEGATIVE,Negative mg/dl    Specific gravity 1.025 1.005 - 1.030      Blood Negative NEGATIVE,Negative      pH (UA) 6.0 5 - 9      Protein Trace (A) NEGATIVE,Negative mg/dl    Urobilinogen 0.2 0.0 - 1.0 EU/dl    Nitrites Positive (A) NEGATIVE,Negative      Leukocyte Esterase Negative NEGATIVE,Negative      Color Dark yellow      Appearance Cloudy     POC URINE MICROSCOPIC    Collection Time: 08/28/18  1:46 PM   Result Value Ref Range    Epithelial cells, squamous 5-9 /LPF    WBC 1-4 /HPF    Bacteria 4+ /HPF   TROPONIN I    Collection Time: 08/28/18  4:30 PM   Result Value Ref Range    Troponin-I <0.015 0.000 - 0.045 ng/ml          Xr Chest Sngl V    Result Date: 08/28/2018  Indication: chest pain.    Marland Kitchen     IMPRESSION: Portable AP upright view the chest exposed at 1:44 PM August 28, 2018 reveals the lungs are clear and the heart is of normal size.Marland Kitchen  No change from March 19, 2017.     Ct Abd Pelv W Cont    Result Date: 08/28/2018  EXAMINATION: CT ABD PELV W CONT CLINICAL INDICATION: Upper abdominal pain     COMPARISON:  April 25, 2018 TECHNIQUE:  All CT exams at this facility use one or more dose reduction techniques including automatic exposure control, ma/kV  adjustment per patient's size, or iterative reconstruction technique. DICOM format image data available to non-affiliated external healthcare facilities or entities on a secure, media free, reciprocal searchable basis with patient authorization for 12 months following the date of the study. Multiple contiguous axial CT images at 5  mm increments were obtained from the bases of the lungs to the  pubic symphysis with intravenous contrast. FINDINGS: Visualized lungs/chest: Unremarkable. Gallbladder normal. No intrahepatic biliary duct dilatation. Hepatobiliary: Unremarkable. Gallbladder normal. Kidneys/ureters: Unremarkable. Stomach: Postop changes about the stomach. Inflammation within the duodenal sweep. Adrenal glands: Unremarkable. Pancreas: Pancreatic head and uncinate process indistinct with adjacent inflammation. Pancreatic body and tail normal. No pancreatic duct dilatation. Previous pancreatic head pseudocyst has resolved. Spleen: Unremarkable. Vascular structures:  Unremarkable. Peritoneum/mesentery/bowel:  Unremarkable. Retroperitoneal lymph nodes: Unremarkable Pelvis:  Left adnexal cystic lesion 36 x 29 mm, new. Appendix normal     Bones: No significant lesions. Miscellaneous: None.     IMPRESSION: 1. Pancreatitis 2. Resolution of pancreatic head pseudocyst. 3. Left adnexal cystic lesion, likely ovarian cyst. Recommend follow-up  pelvic ultrasound in 6 weeks to ensure resolution.     [place] Ruq    Result Date: 08/28/2018  History: RUQ pain.        Impression: Fatty liver. Small gallbladder polyp. Stable peripancreatic head 3 cm fluid collection. Comment: Sonography of the right upper quadrant was performed. This was compared with CT abdomen and pelvis April 25, 2018 and right upper quadrant ultrasound April 08, 2018. The liver is of mild increased echotexture from fatty infiltration. The intra and extrahepatic ducts are of normal caliber. The common bile duct measures 4 millimeters. A 6 mm polyp is noted adherent to the gallbladder wall. Otherwise the wall is of normal thickness. Sonographic Percell Miller sign is negative. No definite gallstones. No pericholecystic fluid. A 3.1 x 2.1 cm fluid collection is again noted anterior to the head of the pancreas unchanged from February 2020. tThe right kidney is unremarkable..       EKG: Preliminary EKG interpreted by myself, Truddie Crumble MD, shows normal sinus rhythm.  Nonspecific EKG changes noted but no signs of acute infarct.       Medical Decision Making/ED Course   Patient's labs shows that urinalysis is positive for nitrates.  5-9 epithelial cells with 4+ bacteria on micro.  Pregnancy test negative.  CBC stable.   Electrolytes returned unremarkable.  AST elevated at 72 with normal ALT at 61 and normal bilirubin at 0.5.  Lipase 396.  Troponin pending.  Repeat 3-hour troponin ordered for complaints of chest pain.  Patient currently at ultrasound right upper quadrant.  Care signed out to Dr. Ysidro Evert to recheck patient and ultrasound results for final disposition.      7:26 PM  Continuation by Thana Farr. Ysidro Evert, MD.  Patient signed out to me by Dr. Rigoberto Noel pending ultrasound and completion of labs.  Patient presents with upper abdominal pain.  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  Admission in the hospital 5 months ago secondary to upper abdominal pain found to have fluid collection inferior to pancreatic  head which was initially thought to represent pseudocyst.  Later thought to be secondary to ruptured duodenal ulcer with walled off abscess.  She was treated with antibiotics and supportively.  She returns today with recurrent pain.  Right upper quadrant ultrasound shows unremarkable gallbladder but with peripancreatic fluid collection noted to be stable compared to CT scan of February 2020.  CT abdomen pelvis was performed and is concerning for acute pancreatitis.  No fluid collection is noted and resolution of "pseudocyst" is reported.  Discussed with Dr. Laren Boom [place] who believes that fluid seen on ultrasound is simply fluid seen on duodenal sweep.  No abscess identified.  Lipase is borderline high but with inflammation seen at pancreatic head will treat as acute pancreatitis admit to hospitalist for bowel rest, IV fluids and supportive care.  Discussed with Dr. Winona Legato for admission.    Medications   ondansetron (ZOFRAN) injection 4 mg (4 mg IntraVENous Given 08/28/18 1503)   iopamidoL (ISOVUE 300) 61 % contrast injection 80 mL (80 mL IntraVENous Given 08/28/18 1748)   morphine injection 4 mg (4 mg IntraVENous Given 08/28/18 1832)     Final Diagnosis       ICD-10-CM ICD-9-CM   1. Abdominal  pain, epigastric R10.13 789.06   2. Acute pancreatitis, unspecified complication status, unspecified pancreatitis type K85.90 577.0     Disposition   Admit      Truddie Crumble MD  August 28, 2018     Thana Farr. Ysidro Evert, MD  August 28, 2018    My signature above authenticates this document and my orders, the final    diagnosis (es), discharge prescription (s), and instructions in the Epic    record.    Nursing notes have been reviewed by the physician.    Please note that portions of this note were completed with a voice recognition program Dragon.  Efforts were made to edit the dictations but words may be mis-transcribed.

## 2018-08-28 NOTE — ED Notes (Addendum)
TRANSFER - OUT REPORT:    Verbal report given to Roseanne, RN (name) on Ashley [name]  being transferred to [facility] (unit) for routine progression of care       Report consisted of patient???s Situation, Background, Assessment and   Recommendations(SBAR).     Information from the following report(s) SBAR was reviewed with the receiving nurse.    Lines:   Peripheral IV 08/28/18 Right Antecubital (Active)        Opportunity for questions and clarification was provided.      Patient transported with:   Registered Nurse

## 2018-08-29 LAB — COMPREHENSIVE METABOLIC PANEL
ALT: 47 U/L (ref 12–78)
AST: 49 U/L — ABNORMAL HIGH (ref 15–37)
Albumin: 2.6 gm/dl — ABNORMAL LOW (ref 3.4–5.0)
Alkaline Phosphatase: 154 U/L — ABNORMAL HIGH (ref 45–117)
Anion Gap: 5 mmol/L (ref 5–15)
BUN: 7 mg/dl (ref 7–25)
CO2: 24 mEq/L (ref 21–32)
Calcium: 7.9 mg/dl — ABNORMAL LOW (ref 8.5–10.1)
Chloride: 108 mEq/L — ABNORMAL HIGH (ref 98–107)
Creatinine: 0.8 mg/dl (ref 0.6–1.3)
EGFR IF NonAfrican American: >60
GFR African American: >60
Glucose: 92 mg/dl (ref 74–106)
Potassium: 3.6 mEq/L (ref 3.5–5.1)
Sodium: 136 mEq/L (ref 136–145)
Total Bilirubin: 1 mg/dl (ref 0.2–1.0)
Total Protein: 6.3 gm/dl — ABNORMAL LOW (ref 6.4–8.2)

## 2018-08-29 LAB — FOLATE
Folate: 5.1 ng/ml (ref 3.1–17.5)
Folate: 5.1 ng/ml (ref 3.1–17.5)

## 2018-08-29 LAB — CBC WITH AUTO DIFFERENTIAL
Basophils %: 0.3 % (ref 0–3)
Eosinophils %: 1.1 % (ref 0–5)
Hematocrit: 35.8 % — ABNORMAL LOW (ref 37.0–50.0)
Hemoglobin: 11.6 gm/dl — ABNORMAL LOW (ref 13.0–17.2)
Immature Granulocytes: 0.3 % (ref 0.0–3.0)
Lymphocytes %: 21.6 % — ABNORMAL LOW (ref 28–48)
MCH: 32.6 pg (ref 25.4–34.6)
MCHC: 32.4 gm/dl (ref 30.0–36.0)
MCV: 100.6 fL — ABNORMAL HIGH (ref 80.0–98.0)
MPV: 11.1 fL — ABNORMAL HIGH (ref 6.0–10.0)
Monocytes %: 5.7 % (ref 1–13)
Neutrophils %: 71 % — ABNORMAL HIGH (ref 34–64)
Nucleated RBCs: 0 (ref 0–0)
Platelets: 206 10*3/uL (ref 140–450)
RBC: 3.56 M/uL — ABNORMAL LOW (ref 3.60–5.20)
RDW-SD: 55.6 — ABNORMAL HIGH (ref 36.4–46.3)
WBC: 7.5 10*3/uL (ref 4.0–11.0)

## 2018-08-29 LAB — FERRITIN
Ferritin: 61.5 ng/ml (ref 8.0–252.0)
Ferritin: 61.5 ng/ml (ref 8.0–252.0)

## 2018-08-29 LAB — VITAMIN B12
VITAMIN B12: 376 pg/ml (ref 193–986)
Vitamin B12: 376 pg/ml (ref 193–986)

## 2018-08-29 LAB — HEMOGLOBIN AND HEMATOCRIT
Hematocrit: 36 % — ABNORMAL LOW (ref 37.0–50.0)
Hemoglobin: 11.9 gm/dl — ABNORMAL LOW (ref 13.0–17.2)

## 2018-08-29 LAB — TIBC
TIBC: 222 ug/dL — ABNORMAL LOW (ref 250–450)
TIBC: 222 ug/dL — ABNORMAL LOW (ref 250–450)

## 2018-08-29 LAB — IRON
Iron: 151 ug/dL (ref 50–170)
Iron: 151 ug/dL (ref 50–170)

## 2018-08-29 LAB — TROPONIN: Troponin I: <0.015 ng/ml (ref 0.000–0.045)

## 2018-08-29 LAB — HGB & HCT
HCT: 36 % — ABNORMAL LOW (ref 37.0–50.0)
HGB: 11.9 gm/dl — ABNORMAL LOW (ref 13.0–17.2)

## 2018-08-29 LAB — METABOLIC PANEL, COMPREHENSIVE
ALT (SGPT): 47 U/L (ref 12–78)
AST (SGOT): 49 U/L — ABNORMAL HIGH (ref 15–37)
Albumin: 2.6 gm/dl — ABNORMAL LOW (ref 3.4–5.0)
Alk. phosphatase: 154 U/L — ABNORMAL HIGH (ref 45–117)
Anion gap: 5 mmol/L (ref 5–15)
BUN: 7 mg/dl (ref 7–25)
Bilirubin, total: 1 mg/dl (ref 0.2–1.0)
CO2: 24 mEq/L (ref 21–32)
Calcium: 7.9 mg/dl — ABNORMAL LOW (ref 8.5–10.1)
Chloride: 108 mEq/L — ABNORMAL HIGH (ref 98–107)
Creatinine: 0.8 mg/dl (ref 0.6–1.3)
GFR est AA: >60
GFR est non-AA: >60
Glucose: 92 mg/dl (ref 74–106)
Potassium: 3.6 mEq/L (ref 3.5–5.1)
Protein, total: 6.3 gm/dl — ABNORMAL LOW (ref 6.4–8.2)
Sodium: 136 mEq/L (ref 136–145)

## 2018-08-29 LAB — CBC WITH AUTOMATED DIFF
BASOPHILS: 0.3 % (ref 0–3)
EOSINOPHILS: 1.1 % (ref 0–5)
HCT: 35.8 % — ABNORMAL LOW (ref 37.0–50.0)
HGB: 11.6 gm/dl — ABNORMAL LOW (ref 13.0–17.2)
IMMATURE GRANULOCYTES: 0.3 % (ref 0.0–3.0)
LYMPHOCYTES: 21.6 % — ABNORMAL LOW (ref 28–48)
MCH: 32.6 pg (ref 25.4–34.6)
MCHC: 32.4 gm/dl (ref 30.0–36.0)
MCV: 100.6 fL — ABNORMAL HIGH (ref 80.0–98.0)
MONOCYTES: 5.7 % (ref 1–13)
MPV: 11.1 fL — ABNORMAL HIGH (ref 6.0–10.0)
NEUTROPHILS: 71 % — ABNORMAL HIGH (ref 34–64)
NRBC: 0 (ref 0–0)
PLATELET: 206 10*3/uL (ref 140–450)
RBC: 3.56 M/uL — ABNORMAL LOW (ref 3.60–5.20)
RDW-SD: 55.6 — ABNORMAL HIGH (ref 36.4–46.3)
WBC: 7.5 10*3/uL (ref 4.0–11.0)

## 2018-08-29 LAB — TROPONIN I: Troponin-I: <0.015 ng/ml (ref 0.000–0.045)

## 2018-08-29 MED ORDER — MULTIVITAMIN,TX-MINERALS TAB
Freq: Every day | ORAL | Status: DC
Start: 2018-08-29 — End: 2018-08-31
  Administered 2018-08-30 – 2018-08-31 (×2): via ORAL

## 2018-08-29 MED ORDER — FERROUS SULFATE 325 MG (65 MG ELEMENTAL IRON) TAB
325 mg (65 mg iron) | Freq: Every day | ORAL | Status: DC
Start: 2018-08-29 — End: 2018-08-29

## 2018-08-29 MED ORDER — PANTOPRAZOLE 40 MG IV SOLR
40 mg | Freq: Two times a day (BID) | INTRAVENOUS | Status: DC
Start: 2018-08-29 — End: 2018-08-31
  Administered 2018-08-30 – 2018-08-31 (×4): via INTRAVENOUS

## 2018-08-29 MED ORDER — DEXTROSE 5% IN NORMAL SALINE IV
INTRAVENOUS | Status: DC
Start: 2018-08-29 — End: 2018-08-31
  Administered 2018-08-29 – 2018-08-31 (×5): via INTRAVENOUS

## 2018-08-29 MED ORDER — NALOXONE 0.4 MG/ML INJECTION
0.4 mg/mL | INTRAMUSCULAR | Status: DC | PRN
Start: 2018-08-29 — End: 2018-08-31

## 2018-08-29 MED ORDER — MORPHINE 2 MG/ML INJECTION
2 mg/mL | INTRAMUSCULAR | Status: DC | PRN
Start: 2018-08-29 — End: 2018-08-31
  Administered 2018-08-29 – 2018-08-31 (×8): via INTRAVENOUS

## 2018-08-29 MED ORDER — SODIUM CHLORIDE 0.9 % IJ SYRG
Freq: Three times a day (TID) | INTRAMUSCULAR | Status: DC
Start: 2018-08-29 — End: 2018-08-31
  Administered 2018-08-29 – 2018-08-31 (×8): via INTRAVENOUS

## 2018-08-29 MED ORDER — SODIUM CHLORIDE 0.9 % IV
INTRAVENOUS | Status: AC
Start: 2018-08-29 — End: 2018-08-29
  Administered 2018-08-29: 01:00:00 via INTRAVENOUS

## 2018-08-29 MED ORDER — SODIUM CHLORIDE 0.9 % IJ SYRG
INTRAMUSCULAR | Status: DC | PRN
Start: 2018-08-29 — End: 2018-08-31
  Administered 2018-08-29: 02:00:00 via INTRAVENOUS

## 2018-08-29 MED ORDER — MORPHINE 2 MG/ML INJECTION
2 mg/mL | INTRAMUSCULAR | Status: DC | PRN
Start: 2018-08-29 — End: 2018-08-29
  Administered 2018-08-29 (×3): via INTRAVENOUS

## 2018-08-29 MED ORDER — PROMETHAZINE IN NS 12.5 MG/50 ML IV PIGGY BAG
12.5 mg/50 ml | Freq: Four times a day (QID) | INTRAVENOUS | Status: DC | PRN
Start: 2018-08-29 — End: 2018-08-31
  Administered 2018-08-31: 17:00:00 via INTRAVENOUS

## 2018-08-29 MED ORDER — THIAMINE HCL 100 MG TAB
100 mg | Freq: Every day | ORAL | Status: DC
Start: 2018-08-29 — End: 2018-08-31
  Administered 2018-08-30 – 2018-08-31 (×2): via ORAL

## 2018-08-29 MED ORDER — FOLIC ACID 1 MG TAB
1 mg | Freq: Every day | ORAL | Status: DC
Start: 2018-08-29 — End: 2018-08-31
  Administered 2018-08-30 – 2018-08-31 (×2): via ORAL

## 2018-08-29 MED ORDER — ACETAMINOPHEN 325 MG TABLET
325 mg | Freq: Four times a day (QID) | ORAL | Status: DC | PRN
Start: 2018-08-29 — End: 2018-08-31

## 2018-08-29 MED ORDER — SODIUM CHLORIDE 0.9% BOLUS IV
0.9 % | Freq: Once | INTRAVENOUS | Status: AC
Start: 2018-08-29 — End: 2018-08-28
  Administered 2018-08-29: 02:00:00 via INTRAVENOUS

## 2018-08-29 MED ORDER — ALUM-MAG HYDROXIDE-SIMETH 200 MG-200 MG-20 MG/5 ML ORAL SUSP
200-200-20 mg/5 mL | ORAL | Status: DC | PRN
Start: 2018-08-29 — End: 2018-08-30
  Administered 2018-08-29: 18:00:00 via ORAL

## 2018-08-29 MED ORDER — SODIUM CHLORIDE 0.9 % INJECTION
40 mg | Freq: Every day | INTRAMUSCULAR | Status: DC
Start: 2018-08-29 — End: 2018-08-29
  Administered 2018-08-29: 13:00:00 via INTRAVENOUS

## 2018-08-29 MED ORDER — HEPARIN (PORCINE) 5,000 UNIT/ML IJ SOLN
5000 unit/mL | Freq: Three times a day (TID) | INTRAMUSCULAR | Status: DC
Start: 2018-08-29 — End: 2018-08-31
  Administered 2018-08-29 – 2018-08-31 (×8): via SUBCUTANEOUS

## 2018-08-29 MED ORDER — ALUM-MAG HYDROXIDE-SIMETH 200 MG-200 MG-20 MG/5 ML ORAL SUSP
200-200-20 mg/5 mL | ORAL | Status: DC | PRN
Start: 2018-08-29 — End: 2018-08-31
  Administered 2018-08-31: 10:00:00 via ORAL

## 2018-08-29 MED ORDER — SODIUM CHLORIDE 0.9 % IV PIGGY BACK
1 gram | INTRAVENOUS | Status: DC
Start: 2018-08-29 — End: 2018-08-31
  Administered 2018-08-29 – 2018-08-31 (×3): via INTRAVENOUS

## 2018-08-29 MED ORDER — ONDANSETRON (PF) 4 MG/2 ML INJECTION
4 mg/2 mL | Freq: Four times a day (QID) | INTRAMUSCULAR | Status: DC | PRN
Start: 2018-08-29 — End: 2018-08-31
  Administered 2018-08-29 – 2018-08-31 (×7): via INTRAVENOUS

## 2018-08-29 MED ORDER — ALBUTEROL SULFATE 0.083 % (0.83 MG/ML) SOLN FOR INHALATION
2.5 mg /3 mL (0.083 %) | Freq: Four times a day (QID) | RESPIRATORY_TRACT | Status: DC | PRN
Start: 2018-08-29 — End: 2018-08-31

## 2018-08-29 MED ORDER — HYDROCODONE-ACETAMINOPHEN 5 MG-325 MG TAB
5-325 mg | ORAL | Status: DC | PRN
Start: 2018-08-29 — End: 2018-08-31
  Administered 2018-08-30 – 2018-08-31 (×5): via ORAL

## 2018-08-29 MED ORDER — SODIUM CHLORIDE 0.9 % IV
INTRAVENOUS | Status: DC
Start: 2018-08-29 — End: 2018-08-29
  Administered 2018-08-29 (×2): via INTRAVENOUS

## 2018-08-29 MED FILL — ONDANSETRON (PF) 4 MG/2 ML INJECTION: 4 mg/2 mL | INTRAMUSCULAR | Qty: 2

## 2018-08-29 MED FILL — SODIUM CHLORIDE 0.9 % IV: INTRAVENOUS | Qty: 1000

## 2018-08-29 MED FILL — MORPHINE 2 MG/ML INJECTION: 2 mg/mL | INTRAMUSCULAR | Qty: 2

## 2018-08-29 MED FILL — MORPHINE 2 MG/ML INJECTION: 2 mg/mL | INTRAMUSCULAR | Qty: 1

## 2018-08-29 MED FILL — MAG-AL PLUS 200 MG-200 MG-20 MG/5 ML ORAL SUSPENSION: 200-200-20 mg/5 mL | ORAL | Qty: 30

## 2018-08-29 MED FILL — BD POSIFLUSH NORMAL SALINE 0.9 % INJECTION SYRINGE: INTRAMUSCULAR | Qty: 10

## 2018-08-29 MED FILL — DEXTROSE 5% IN NORMAL SALINE IV: INTRAVENOUS | Qty: 1000

## 2018-08-29 MED FILL — PANTOPRAZOLE 40 MG IV SOLR: 40 mg | INTRAVENOUS | Qty: 40

## 2018-08-29 MED FILL — CEFTRIAXONE 1 GRAM SOLUTION FOR INJECTION: 1 gram | INTRAMUSCULAR | Qty: 1

## 2018-08-29 MED FILL — HEPARIN (PORCINE) 5,000 UNIT/ML IJ SOLN: 5000 unit/mL | INTRAMUSCULAR | Qty: 1

## 2018-08-29 NOTE — Consults (Signed)
Surgery Consult      Patient: Ashley [name]               Sex: female          DOA: 08/28/2018       Date of Birth:  June 14, 1985      Age:  33 y.o.        LOS:  LOS: 1 day       HPI:      Ashley [name] is a 33 y.o. female who presents with abdominal pain.  Patient was diagnosed with pancreatitis in February.  At that time had a CAT scan that showed a large pseudocyst as well as questionable abscess.  Patient resolved from this.  Patient had an ultrasound and a CAT scan that showed a 6 mm polyp versus stone.  Patient was to follow-up with Dr. Laurance Flatten as an outpatient for possible cholecystectomy.  Patient now presents again to the emergency room with abdominal pain.  Patient was found to have slightly elevated pancreatic enzymes.  This pseudocyst has improved.  Patient states most of her pain is in the epigastric right upper quadrant    Past Medical History:   Diagnosis Date   ??? Ill-defined condition     bronchitis   ??? Pneumonia      Past Surgical History:   Procedure Laterality Date   ??? HX CESAREAN SECTION  2002, 2004, 2008    x 3   ??? HX CESAREAN SECTION     ??? HX GASTRIC BYPASS  10-2009    [facility]   ??? HX GASTRIC BYPASS     ??? HX GASTRIC BYPASS        Family History   Problem Relation Age of Onset   ??? Diabetes Mother    ??? Hypertension Father    ??? Heart Disease Maternal Grandmother    ??? Heart Attack Maternal Grandmother    ??? High Cholesterol Maternal Grandmother    ??? Arthritis-osteo Maternal Grandmother    ??? Stroke Paternal Aunt    ??? Kidney Disease Maternal Aunt      Social History     Socioeconomic History   ??? Marital status: SINGLE     Spouse name: Not on file   ??? Number of children: Not on file   ??? Years of education: Not on file   ??? Highest education level: Not on file   Tobacco Use   ??? Smoking status: Former Smoker     Types: Cigarettes     Last attempt to quit: 11/12/2009     Years since quitting: 8.8   ??? Smokeless tobacco: Never Used   Substance and Sexual Activity   ??? Alcohol use: Not Currently      Alcohol/week: 0.0 - 4.0 standard drinks     Comment: occassional   ??? Drug use: No   ??? Sexual activity: Yes     Partners: Male     Birth control/protection: None   Social History Narrative    ** Merged History Encounter **           Current Facility-Administered Medications   Medication Dose Route Frequency   ??? ondansetron (ZOFRAN) injection 4 mg  4 mg IntraVENous Q6H PRN   ??? cefTRIAXone (ROCEPHIN) 1 g in 0.9% sodium chloride (MBP/ADV) 50 mL MBP  1 g IntraVENous Q24H   ??? dextrose 5% and 0.9% NaCl infusion  100 mL/hr IntraVENous CONTINUOUS   ??? morphine injection 2 mg  2 mg IntraVENous  Q4H PRN   ??? promethazine (PHENERGAN) 12.5 mg in NS 50 mL IVPB  12.5 mg IntraVENous Q6H PRN   ??? alum-mag hydroxide-simeth (MYLANTA) oral suspension 30 mL  30 mL Oral Q4H PRN   ??? sodium chloride (NS) flush 5-10 mL  5-10 mL IntraVENous Q8H   ??? sodium chloride (NS) flush 5-10 mL  5-10 mL IntraVENous PRN   ??? naloxone (NARCAN) injection 0.1 mg  0.1 mg IntraVENous PRN   ??? acetaminophen (TYLENOL) tablet 650 mg  650 mg Oral Q6H PRN   ??? HYDROcodone-acetaminophen (NORCO) 5-325 mg per tablet 1 Tab  1 Tab Oral Q4H PRN   ??? alum-mag hydroxide-simeth (MYLANTA) oral suspension 30 mL  30 mL Oral Q4H PRN   ??? albuterol (PROVENTIL VENTOLIN) nebulizer solution 2.5 mg  2.5 mg Nebulization Q6H PRN   ??? heparin (porcine) injection 5,000 Units  5,000 Units SubCUTAneous Q8H   ??? pantoprazole (PROTONIX) 40 mg in 0.9% sodium chloride 10 mL injection  40 mg IntraVENous DAILY      Allergies   Allergen Reactions   ??? Aspirin Other (comments)     Stomach ulcer   ??? Ibuprofen Unknown (comments)     Gastric Bypass--advised to avoid   ??? Nsaids (Non-Steroidal Anti-Inflammatory Drug) Other (comments)     Hx gastric bypass       Review of Systems:  A comprehensive review of systems was negative except for that written in the History of Present Illness.    Physical Exam:        Patient Vitals for the past 8 hrs:   BP Temp Pulse Resp SpO2   08/29/18 1143 114/67 98 ??F (36.7 ??C) 79  16 98 %   08/29/18 0829 105/65 98.2 ??F (36.8 ??C) 75 16 100 %       Temp (24hrs), Avg:98.2 ??F (36.8 ??C), Min:98 ??F (36.7 ??C), Max:98.6 ??F (37 ??C)      Physical Exam:  Normocephalic atraumatic no cervical adenopathy sclera icteric there is no thyromegaly her extract muscles are intact mucous membranes are moist her heart regular rate and rhythm lungs are clear to auscultation her abdomen is positive bowel sounds soft tenderness in the epigastric region no peritoneal signs no masses    Labs Reviewed:  All lab results for the last 24 hours reviewed.      Assessment:     Active Problems:    Acute pancreatitis (08/28/2018)          Plan:     [id number] year old female with a history of pancreatitis with pseudocyst questionable abscess which has mostly resolved patient now has returned with abdominal pain again elevated slightly pancreatic enzymes.  Patient had CT scan and ultrasound.  Ultrasound shows a 6 mm polyp in the gallbladder.  At the present time I agree with having GI evaluation for questionable bypass ulcer  Versus regular ulcer.  Patient would probably benefit from laparoscopic cholecystectomy because a 6 mm polyp in her gallbladder.  Patient does have a history of alcohol use which might be the cause of her pancreatitis.  Will follow with you we will see what GI suggests.  Signed By: Philis Piqueavid L Adiya Selmer, MD, FACS    August 29, 2018

## 2018-08-29 NOTE — Progress Notes (Signed)
Progress Notes by Everlene Farrier, MD at 08/29/18 2956                Author: Everlene Farrier, MD  Service: Hospitalist  Author Type: Physician       Filed: 08/29/18 1033  Date of Service: 08/29/18 0917  Status: Signed          Editor: Everlene Farrier, MD (Physician)                          INTERNAL MEDICINE PROGRESS NOTE      Date of note:      August 29, 2018      Patient:               Ashley [name],  33 y.o., female   Admit Date:        08/28/2018   Length of Stay:  1 day(s)      Problem List:      Patient Active Problem List        Diagnosis  Code         ?  Previous cesarean delivery, antepartum condition or complication  O13.086     ?  Bariatric surgery status complicating pregnancy, childbirth, or the puerperium, antepartum condition or complication  V78.469     ?  Intra-abdominal abscess ([place])  K65.1     ?  IV infiltrate  T80.1XXA     ?  Duodenitis  K29.80         ?  Acute pancreatitis  K85.90             Subjective + interval history        Has abdominal pain in lower abdomen, bladder pressure   Had diarrhea prior to admission. No BM today        Assessment :        1.  Presented with RUQ and epigastric pain   2.  Acute pancreatitis   3.  Elevated LFT   4.  Diarrhea for 2 weeks prior to admission, no recent antibiotic use   5.  Hospitalization in Jan 2020 for peripancreatic cyst/ abscess - presumed to be due to ruptured duodenal ulcer. Treated with antibiotics   6.  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  Obesity BMI 36   9.  Daily alcohol use   10.  Left adnexal cyst - likely ovarian cyst - outpatient follow up        Plan :        Continue bowel rest, judicious pain management. IV fluids      RUQ ultrasound - Fatty liver. Small gallbladder polyp. Stable peripancreatic head 3 cm fluid collection.   However, CT abdomen - . Pancreatitis ;Resolution of pancreatic head pseudocyst      After last hospitalization in Jan 2020, patient was supposed to follow up  with general sugery Dr. Laurance Flatten to schedule lap cholecystectomy and GI Dr. Delma Freeze. But she did not follow up       I have consulted both general surgery and GI      PPI for GI ppx      Ceftriaxone for UTI. Follow urine culture      Check C. Diff if diarrhea persists      Patient has been counseled to quit drinking alcohol       She says she will update her  family         - Code status: full code      Recommend to continue hospitalization.   Expected date of discharge: tbd   Plan for disposition - tbd        Objective:        Visit Vitals      BP  105/65 (BP 1 Location: Right arm, BP Patient Position: Supine)     Pulse  75     Temp  98.2 ??F (36.8 ??C)     Resp  16     Ht  5' (1.524 m)     Wt  84.8 kg (187 lb)     SpO2  100%        BMI  36.52 kg/m??              No intake or output data in the 24 hours ending 08/29/18 0917        Physical Exam:        GEN - AAOx3, NAD   HEENT -  mucous membranes moist   Neck - supple, no JVD   Cardiac - RRR, S1, S2, no murmurs   Chest/Lungs - clear to auscultation without wheezes or rhonchi   Abdomen - soft, tenderness in suprapubic region, epigastric region and RUQ . No guarding   Extremities - no clubbing/ cyanosis/ edema   Neuro - CN 2-12 intact. No focal deficits. No motor or sensory deficit appreciated.    Skin - no rashes or lesions        Current medications:           Current Facility-Administered Medications:    ?  ondansetron (ZOFRAN) injection 4 mg, 4 mg, IntraVENous, Q6H PRN, Knox Saliva, MD, 4 mg at 08/29/18 0840   ?  ferrous sulfate tablet 325 mg, 325 mg, Oral, DAILY WITH BREAKFAST, Charlaine Dalton, MD, Stopped at 08/29/18 0800   ?  sodium chloride (NS) flush 5-10 mL, 5-10 mL, IntraVENous, Q8H, Charlaine Dalton, MD, 10 mL at 08/28/18 2138   ?  sodium chloride (NS) flush 5-10 mL, 5-10 mL, IntraVENous, PRN, Charlaine Dalton, MD, 10 mL at 08/28/18 2137   ?  naloxone [facility]) injection 0.1 mg, 0.1 mg, IntraVENous, PRN, Charlaine Dalton, MD   ?  acetaminophen (TYLENOL) tablet 650  mg, 650 mg, Oral, Q6H PRN, Charlaine Dalton, MD   ?  HYDROcodone-acetaminophen (NORCO) 5-325 mg per tablet 1 Tab, 1 Tab, Oral, Q4H PRN, Charlaine Dalton, MD   ?  morphine injection 2-4 mg, 2-4 mg, IntraVENous, Q4H PRN, Charlaine Dalton, MD, 2 mg at 08/29/18 5852   ?  alum-mag hydroxide-simeth (MYLANTA) oral suspension 30 mL, 30 mL, Oral, Q4H PRN, Charlaine Dalton, MD   ?  albuterol (PROVENTIL VENTOLIN) nebulizer solution 2.5 mg, 2.5 mg, Nebulization, Q6H PRN, Charlaine Dalton, MD   ?  heparin (porcine) injection 5,000 Units, 5,000 Units, SubCUTAneous, Q8H, Charlaine Dalton, MD, Stopped at 08/29/18 0500   ?  0.9% sodium chloride infusion, 150 mL/hr, IntraVENous, CONTINUOUS, Charlaine Dalton, MD, Last Rate: 150 mL/hr at 08/29/18 0300, 150 mL/hr at 08/29/18 0300   ?  pantoprazole (PROTONIX) 40 mg in 0.9% sodium chloride 10 mL injection, 40 mg, IntraVENous, DAILY, Charlaine Dalton, MD, 40 mg at 08/29/18 0840        Labs:          Recent Results (from the past 24 hour(s))     CBC WITH AUTOMATED DIFF  Collection Time: 08/28/18  1:28 PM         Result  Value  Ref Range            WBC  8.3  4.0 - 11.0 1000/mm3       RBC  3.87  3.60 - 5.20 M/uL       HGB  12.8 (L)  13.0 - 17.2 gm/dl       HCT  38.0  37.0 - 50.0 %       MCV  98.2 (H)  80.0 - 98.0 fL       MCH  33.1  25.4 - 34.6 pg       MCHC  33.7  30.0 - 36.0 gm/dl       PLATELET  255  140 - 450 1000/mm3       MPV  10.7 (H)  6.0 - 10.0 fL       RDW-SD  54.8 (H)  36.4 - 46.3         NRBC  0  0 - 0         IMMATURE GRANULOCYTES  0.4  0.0 - 3.0 %       NEUTROPHILS  73.8 (H)  34 - 64 %       LYMPHOCYTES  19.1 (L)  28 - 48 %       MONOCYTES  6.4  1 - 13 %       EOSINOPHILS  0.2  0 - 5 %       BASOPHILS  0.1  0 - 3 %       METABOLIC PANEL, COMPREHENSIVE          Collection Time: 08/28/18  1:28 PM         Result  Value  Ref Range            Sodium  136  136 - 145 mEq/L       Potassium  3.7  3.5 - 5.1 mEq/L       Chloride  106  98 - 107 mEq/L       CO2  23  21 - 32 mEq/L       Glucose  71  (L)  74 - 106 mg/dl       BUN  7  7 - 25 mg/dl       Creatinine  0.8  0.6 - 1.3 mg/dl       GFR est AA  >60.0          GFR est non-AA  >60          Calcium  8.3 (L)  8.5 - 10.1 mg/dl       AST (SGOT)  72 (H)  15 - 37 U/L       ALT (SGPT)  61  12 - 78 U/L       Alk. phosphatase  164 (H)  45 - 117 U/L       Bilirubin, total  0.5  0.2 - 1.0 mg/dl       Protein, total  7.3  6.4 - 8.2 gm/dl       Albumin  3.0 (L)  3.4 - 5.0 gm/dl       Anion gap  7  5 - 15 mmol/L       LIPASE          Collection Time: 08/28/18  1:28 PM         Result  Value  Ref  Range            Lipase  396 (H)  73 - 393 U/L       TROPONIN I          Collection Time: 08/28/18  1:28 PM         Result  Value  Ref Range            Troponin-I  <0.015  0.000 - 0.045 ng/ml       CULTURE, URINE          Collection Time: 08/28/18  1:35 PM         Result  Value  Ref Range            Isolate  (A)                >100,000 CFU/mL   Gram Negative Bacilli Isolated   Identification And Susceptibility To Follow          POC HCG,URINE          Collection Time: 08/28/18  1:40 PM         Result  Value  Ref Range            HCG urine, QL  negative  NEGATIVE,Negative,negative         EKG, 12 LEAD, INITIAL          Collection Time: 08/28/18  1:41 PM         Result  Value  Ref Range            Ventricular Rate  88  BPM       Atrial Rate  88  BPM       P-R Interval  140  ms       QRS Duration  82  ms       Q-T Interval  362  ms       QTC Calculation (Bezet)  438  ms       Calculated P Axis  53  degrees       Calculated R Axis  23  degrees       Calculated T Axis  23  degrees       Diagnosis                 Normal sinus rhythm   Normal ECG   When compared with ECG of 19-Mar-2017 10:43,   No significant change since prior tracing   Confirmed by Marolyn Haller, M.D., Maeola Harman ([id number]) on 08/28/2018 5:54:53 PM          POC URINE MACROSCOPIC          Collection Time: 08/28/18  1:46 PM         Result  Value  Ref Range            Glucose  Negative  NEGATIVE,Negative mg/dl       Bilirubin  Negative   NEGATIVE,Negative         Ketone  Negative  NEGATIVE,Negative mg/dl       Specific gravity  1.025  1.005 - 1.030         Blood  Negative  NEGATIVE,Negative         pH (UA)  6.0  5 - 9         Protein  Trace (A)  NEGATIVE,Negative mg/dl       Urobilinogen  0.2  0.0 - 1.0 EU/dl       Nitrites  Positive (A)  NEGATIVE,Negative         Leukocyte Esterase  Negative  NEGATIVE,Negative         Color  Dark yellow          Appearance  Cloudy          POC URINE MICROSCOPIC          Collection Time: 08/28/18  1:46 PM         Result  Value  Ref Range            Epithelial cells, squamous  5-9  /LPF       WBC  1-4  /HPF       Bacteria  4+  /HPF       TROPONIN I          Collection Time: 08/28/18  4:30 PM         Result  Value  Ref Range            Troponin-I  <0.015  0.000 - 0.045 ng/ml       TROPONIN I          Collection Time: 08/28/18  7:40 PM         Result  Value  Ref Range            Troponin-I  <0.015  0.000 - 0.045 ng/ml       HGB & HCT          Collection Time: 08/28/18  9:39 PM         Result  Value  Ref Range            HGB  11.9 (L)  13.0 - 17.2 gm/dl       HCT  36.0 (L)  37.0 - 50.0 %       CBC WITH AUTOMATED DIFF          Collection Time: 08/29/18  2:35 AM         Result  Value  Ref Range            WBC  7.5  4.0 - 11.0 1000/mm3       RBC  3.56 (L)  3.60 - 5.20 M/uL       HGB  11.6 (L)  13.0 - 17.2 gm/dl       HCT  35.8 (L)  37.0 - 50.0 %       MCV  100.6 (H)  80.0 - 98.0 fL       MCH  32.6  25.4 - 34.6 pg       MCHC  32.4  30.0 - 36.0 gm/dl       PLATELET  206  140 - 450 1000/mm3       MPV  11.1 (H)  6.0 - 10.0 fL       RDW-SD  55.6 (H)  36.4 - 46.3         NRBC  0  0 - 0         IMMATURE GRANULOCYTES  0.3  0.0 - 3.0 %       NEUTROPHILS  71.0 (H)  34 - 64 %       LYMPHOCYTES  21.6 (L)  28 - 48 %       MONOCYTES  5.7  1 - 13 %       EOSINOPHILS  1.1  0 - 5 %       BASOPHILS  0.3  0 - 3 %  METABOLIC PANEL, COMPREHENSIVE          Collection Time: 08/29/18  2:35 AM         Result  Value  Ref Range             Sodium  136  136 - 145 mEq/L       Potassium  3.6  3.5 - 5.1 mEq/L       Chloride  108 (H)  98 - 107 mEq/L       CO2  24  21 - 32 mEq/L       Glucose  92  74 - 106 mg/dl       BUN  7  7 - 25 mg/dl       Creatinine  0.8  0.6 - 1.3 mg/dl       GFR est AA  >60.0          GFR est non-AA  >60          Calcium  7.9 (L)  8.5 - 10.1 mg/dl       AST (SGOT)  49 (H)  15 - 37 U/L       ALT (SGPT)  47  12 - 78 U/L       Alk. phosphatase  154 (H)  45 - 117 U/L       Bilirubin, total  1.0  0.2 - 1.0 mg/dl       Protein, total  6.3 (L)  6.4 - 8.2 gm/dl       Albumin  2.6 (L)  3.4 - 5.0 gm/dl            Anion gap  5  5 - 15 mmol/L           XR Results:     Results from Hospital Encounter encounter on 08/28/18     XR CHEST SNGL V           Narrative  Indication: chest pain.    .              Impression  IMPRESSION: Portable AP upright view the chest exposed at 1:44 PM August 28, 2018   reveals the lungs are clear and the heart is of normal size.Marland Kitchen  No change from   March 19, 2017.              CT Results:     Results from Hospital Encounter encounter on 08/28/18     CT ABD PELV W CONT           Narrative  EXAMINATION: CT ABD PELV W CONT      CLINICAL INDICATION: Upper abdominal pain           COMPARISON:  April 25, 2018      TECHNIQUE:  All CT exams at this facility use one or more dose reduction   techniques including automatic exposure control, ma/kV  adjustment per patient's   size, or iterative reconstruction technique. DICOM format image data available   to non-affiliated external healthcare facilities or entities on a secure, media   free, reciprocal searchable basis with patient authorization for 12 months   following the date of the study.      Multiple contiguous axial CT images at 5  mm increments were obtained from the   bases of the lungs to the  pubic symphysis with intravenous contrast.      FINDINGS:      Visualized lungs/chest: Unremarkable. Gallbladder normal. No intrahepatic  biliary duct dilatation.       Hepatobiliary: Unremarkable. Gallbladder normal.      Kidneys/ureters: Unremarkable.      Stomach: Postop changes about the stomach. Inflammation within the duodenal   sweep.      Adrenal glands: Unremarkable.      Pancreas: Pancreatic head and uncinate process indistinct with adjacent   inflammation. Pancreatic body and tail normal. No pancreatic duct dilatation.   Previous pancreatic head pseudocyst has resolved.      Spleen: Unremarkable.      Vascular structures:  Unremarkable.      Peritoneum/mesentery/bowel:  Unremarkable.      Retroperitoneal lymph nodes: Unremarkable      Pelvis:  Left adnexal cystic lesion 36 x 29 mm, new. Appendix normal           Bones: No significant lesions.      Miscellaneous: None.              Impression  IMPRESSION:       1. Pancreatitis   2. Resolution of pancreatic head pseudocyst.   3. Left adnexal cystic lesion, likely ovarian cyst. Recommend follow-up pelvic   ultrasound in 6 weeks to ensure resolution.              MRI Results:     Results from Hospital Encounter encounter on 04/03/18     MRI ABD W WO CONT           Narrative  MRI ABDOMEN WITH AND WITHOUT CONTRAST      CLINICAL HISTORY: Right upper quadrant pain      COMPARISON: CT dated 04/03/2018      TECHNIQUE: Multiplanar, multisequence imaging of the upper abdomen was performed   before and after administration of intravenous contrast.      FINDINGS:      Subsegmental atelectasis left greater than right lung base.      2.7 cm focal lesion within the left hepatic lobe, adjacent to the falciform   ligament is consistent with focal hepatic steatosis. This region manifested as   signal dropout on the out of phase sequence. Liver, otherwise grossly   unremarkable. Gallbladder, spleen, adrenal glands and kidneys grossly   unremarkable. Gastric bypass surgery. Inflammatory change adjacent to the   pancreatic head and uncinate process. There is also thickening of the distal   second and proximal third portions of the duodenum.  Adjacent to the junction of   the second and third portions of the duodenum, there is a peripherally enhancing   8.5 x 4.6 x 2.8 cm (ML by CC by AP) fluid collection. The fluid collection is   intimate with the inferior margin of the duodenum within this region, and   extends to the right anterior perirenal fascia. This is concerning for a   developing abscess. This may be related to a contained, ruptured ulcer. Other   differential considerations may include pancreatic pseudocyst with possible   superimposed inflammation of the pancreatic head/uncinate process.              Impression  IMPRESSION:   1. Lesion within the liver visualized on previous CT is most consistent with   focal fatty infiltration. No aggressive process.   2. Prominent peripherally enhancing fluid collection adjacent to the pancreatic   head/uncinate process with extensive surrounding inflammatory change as   described. This is concerning for a developing abscess. This may be related to a   contained, ruptured ulcer. Other differential considerations may include  pancreatic pseudocyst with possible superimposed inflammation of the pancreatic   head/uncinate process.   3. Subsegmental atelectasis within the lung bases.              Nuclear Medicine Results:   No results found for this or any previous visit.      [place] Results:     Results from [facility] encounter on 08/28/18     [place] RUQ           Narrative  History: RUQ pain.                  Impression  Impression: Fatty liver. Small gallbladder polyp. Stable peripancreatic head 3   cm fluid collection.      Comment: Sonography of the right upper quadrant was performed. This was compared   with CT abdomen and pelvis April 25, 2018 and right upper quadrant ultrasound   April 08, 2018.      The liver is of mild increased echotexture from fatty infiltration. The intra   and extrahepatic ducts are of normal caliber. The common bile duct measures 4   millimeters. A 6 mm polyp is noted  adherent to the gallbladder wall. Otherwise   the wall is of normal thickness. Sonographic Percell Miller sign is negative. No   definite gallstones. No pericholecystic fluid. A 3.1 x 2.1 cm fluid collection   is again noted anterior to the head of the pancreas unchanged from February   2020. tThe right kidney is unremarkable..              IR Results:   No results found for this or any previous visit.      VAS/US Results:     Results from Hospital Encounter encounter on 01/20/18     DUPLEX LOWER EXT VENOUS RIGHT           Narrative                                                                Study ID:    [id number]                                                      [facility]                                             [id number] [place],                                           [place]                                        Lower Extremity Venous Report      Name: TINITA, BROOKER Date: 01/20/2018 08:00 PM   MRN: [id number]                      Patient Location: [id number]   DOB: 1985/03/21                  Age: 52 yrs   Gender: Female                   Account #: [id number]   Reason For Study: Right leg pain   Ordering Physician: Johnella Moloney      Performed By: Lowella Dell, RVS      Interpretation Summary   No evidence of superficial or deep vein thrombosis noted in the right lower   extremity.   No evidence of deep venous thrombosis in the contralateral/left common    femoral   vein.   _____________________________________________________________________________   __         QUALITY/PROCEDURE   Limited unilateral venous duplex performed of the right leg. Quality of the   study is good. M79.604.       HISTORY/SYMPTOMS   Right leg pain.      RIGHT LEG   The common femoral, femoral, popliteal, posterior tibial and peroneal veins   were examined with duplex ultrasound. The deep veins were patent and   compressible with no evidence of intraluminal thrombus. Spontaneous, phasic   venous flow with normal augmentation noted throughout the right leg.      RIGHT SAPHENOUS VEINS   Spectral Doppler exam demonstrates normal venous flow in the right great   saphenous vein. Compression of the right great saphenous vein is complete.   Filling defects in the right great saphenous vein are absent.         LEFT LEG   Spectral Doppler exam demonstrates normal venous flow in the left common   femoral vein. Compression of the left common femoral vein is complete.    Filling   defects in the left common femoral vein are absent.         Electronically signed byDR Wenda Low, MD   01/21/2018 01:10  AM              Total clinical care time was 40   minutes of which more than 50% was spent in coordination of care and counseling (time spent with patient/family face to face, physical exam, reviewing laboratory and imaging investigations, speaking with physicians and  nursing staff involved in this patient's care).       Perfecto Kingdom,  MD   [facility]   August 29, 2018   Time: 9:17 AM

## 2018-08-29 NOTE — Progress Notes (Signed)
 NUTRITION RECOMMENDATIONS:   . Advance diet as tolerated to low fat when medically feasible.  . Order Ensure Clear (each provides 240 kcal, 8g protein) TID while on clear liquids and advance to Ensure Compact (each 4 oz bottle provides 220 kcal, 9g protein) once diet advances.  . Rec add thiamine and MVI/min supplementation, consider adding Vit B12 and Ca/D as well.   . Daily wts to trend.    NUTRITION INITIAL EVALUATION    NUTRITION ASSESSMENT:       Reason for assessment: MST    Admitting diagnosis:   Acute pancreatitis [K85.90]     PMH:   Past Medical History:   Diagnosis Date   . Ill-defined condition     bronchitis   . Pneumonia        Current pertinent medications: heparin, ferrous sulfate, protonix, IVF @ 150 ml/hr    Pertinent labs: 6/17: AST 49, ALP 154    Anthropometrics & Physical Assessment:   Height:   Ht Readings from Last 3 Encounters:   08/28/18 5' (1.524 m)   04/03/18 5' (1.524 m)   04/06/18 5' 1 (1.549 m)      Weight:   Wt Readings from Last 3 Encounters:   08/28/18 84.8 kg (187 lb)   04/03/18 88.9 kg (196 lb)   04/06/18 80.9 kg (178 lb 5.6 oz)         BMI: Body mass index is 36.52 kg/m.     IBW: Ideal body weight: 45.5 kg (100 lb 4.9 oz)           UBW: 192 lb per pt, states clothes have been fitting differently    Wt change: -5 lb (2.6%) x 5 months         []  significant       [x]  not significant         []  intended         [x]  not intended    GI symptoms/issues:       Pt admitted w/ pancreatitis. Reports abd pain, n/v  Hx if gastric bypass  Hx of duodenal ulcer w/ perf Jan 2020  Last Bowel Movement Date: 08/28/18     Abdominal Assessment: Obese, Tender, Semi-soft  Bowel Sounds: Active     Chewing/swallowing issues:   None reported    Skin integrity:   intact       Muscle Wasting:  . Temporal: none  . Clavicle: none    Fat Wasting:  . Suborbital: none             Fluid accumulation:    none       Diet and intake history:    Current diet order:    DIET NPO     Food allergies:  none     Diet/intake history: Pt reports appetite had improved a lot since January admission, but has dropped off again lately. States eating one meal per day d/t work and schedule. Would sometimes drink a protein shake. States being lactose intolerant (milk), okay with cheese.  Takes a MVI/min and FA supplement at home.            []  <50% intake x >5 days         [x]  <50% intake x >1 month         []  <75% intake x >7 days         []  <75% intake x 1 month         []  <75% intake  x 3 months     Current appetite/PO intake:   No data recorded   Monitor intake per EMR documentation once information becomes available     Assessment of Current MNT:   Currently NPO which is inadequate to meet needs. Advance diet as tolerated to low fat when medically able.     Estimated daily nutrition intake needs:   1700 - 2125 kcals (20-25 kcal/kg)   85 - 102 g protein (1-1.2 g/kg)   2125 mL fluid (25 ml/kg)    NUTRITION DIAGNOSIS:     1. Inadequate energy intake related to decreased ability to consume sufficient energy 2/2 prior hospitalization and pancreatitis as evidenced by pt report <50% intake >1 month, and current NPO status.      NUTRITION INTERVENTION / RECOMMENDATIONS:     . Advance diet as tolerated to low fat when medically feasible.  . Order Ensure Clear (each provides 240 kcal, 8g protein) TID while on clear liquids and advance to Ensure Compact (each 4 oz bottle provides 220 kcal, 9g protein) once diet advances.  . Rec add thiamine and MVI/min supplementation, consider adding Vit B12 and Ca/D as well.   . Daily wts to trend.    NUTRITION EDUCATION     Education history:   []  No prior education  []  Inpatient education  []  Outpatient education  []  Other:   [x]  Unknown    Diet history: see above    Diet instructed on: pancreatitis    Person(s) educated:  [x]  patient  []  family/significant other  []  guardian/caregiver  []  other:    Education modifications:   []  cultural/religious preferences  []  visual (font size)     []   language     []  age appropriate  []  other:    Barriers to learning/intervention:   []  emotional     []  desire/motivation     []  readiness to learn   []  financial  []  physical and/or cognitive barriers     []  other:    Method of Instruction:   [x]  verbal      [x]  written         []  teachback       []  interpreter:          []  other:     Learner comprehension:    []  unsure  []  poor     []  fair    [x]  good    Expected compliance:   []  unsure  []  poor      []  fair        [x]  good       NUTRITION MONITORING AND EVALUATION:     Nutrition level of care:  []  low       [x]  moderate      []  high    Nutrition monitoring: PO intake, diet tolerance and compliance, wt trend, nutrition-related labs, hydration, s/sx of new skin concerns, medical changes; will follow up per policy.    Nutrition goals:  Pt to meet/tolerate >75% of estimated needs, maintain weight throughout LOS, nutrition-related labs WNL, maintain skin integrity.    Duwaine Levin, MS, RD  08/29/18  Pager # [phone]

## 2018-08-29 NOTE — Consults (Signed)
Consults  by Victorino Dike D, NP at 08/29/18 1518                Author: Ardeth Sportsman, NP  Service: Gastroenterology  Author Type: Nurse Practitioner       Filed: 08/29/18 1844  Date of Service: 08/29/18 1518  Status: Attested Addendum          Editor: Ardeth Sportsman, NP (Nurse Practitioner)       Related Notes: Original Note by Ardeth Sportsman, NP (Nurse Practitioner) filed at 08/29/18  1844          Cosigner: Isac Sarna, MD at 08/29/18 2003          Attestation signed by Isac Sarna, MD at 08/29/18 2003          I have reviewed the mid-level documentation, agree with the documentation, medical decision making and treatment plan as outlined by the mid-level provider.    She either has acute recurrent pancreatitis due to alcohol intake versus chronic pancreatitis.    CT scan and [place] were reviewed with radiology and there is definite resolution of her prior pseudocyst and that the [place] finding this admission was the duodenal  lumen and not a pseudocyst.  Continue supportive management and patient educated about the dangers of regular alcohol intake.                                            Gastroenterology Consult          Patient: Ashley [name]  MRN: [id number]   SSN: [id number]          Date of Birth: [birth year]   Age: 33 y.o.   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  Normal gallbladder.  Inflammation within the duodenal sweep.  Postop changes about the stomach.  Pancreatic head with use in a process in the setting with adjacent inflammation.  Pancreatic body and tail normal.   No pancreatic ductal dilatation.  Previous pancreatic head pseudocyst has resolved.  Spleen unremarkable.  Peritoneum/mesentery/bowel unremarkable.  Retroperitoneal lymph nodes unremarkable.    -[place] 08/28/2018 reviewed with Radiologist.  No evidence of fluid collection of pseudocyst identified on subsequent review of imaging  with comparison. Small gallbladder polyp noted and fatty infiltration of the liver. CBD 4 mm. Intra-and extrahepatic  ducts normal. No definite gallstones.   --Hx of possible sealed off duodenal ulcer perforation with possible abscess    --Elevated AST and alk phos suspect secondary to EtOH use. 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 Rule out B12 or folate deficiency   --Diarrhea rule out infectious source, pancreatic insufficiency or other etiology   --Roux-en-Y Gastric Bypass   --Family hx of Ulcerative Colitis-Mother   --Family hx Colon Cancer-Maternal Grandfather     Plan:     --Monitor LFTs, CBC, BMP, periodic lipase   --Start Protonix 66m BID    --Will order iron studies, B12, folate, acute hepatitis panel   --Will order stool studies to include stool for C. Diff and pancreatic elastase   --  Continue IVF, trial full liquid diet as tolerated    --Monitor abdominal exam, Judicious pain management   --If no improvement in symptoms, may consider repeat EGD     --Start thiamine, folic acid and MVI   --More recommendations pending above results and pts clinical condition      Subjective:         Ashley [name] is a 33 y.o. female who is being seen for worsening abdominal pain more prominent to the epigastric and right upper quadrant  area over the last few days.  The patient states the pain was similar to her prior episode in January when she was noted to have pancreatitis thought to be secondary to ulcer formation MRI findings at that time indicating a possible sealed off duodenal  ulcer perforation with possible abscess. She was evaluated by [facility]. The pt states that she was not able to follow up as indicated. She states that her abdominal pain was intermittent since last hospitalization. She denies dizziness,  SOB or chest  pain. She reports nausea, vomiting x multiple episodes of bilious liquid with her last episode just prior to hospitalization. She denies hematemesis.  She reports a fair appetite and denies weight loss.  Her normal bowel pattern consist of daily to every  other day firm to soft brown color stools but states that over the last few weeks she has been experiencing increase stool output consisting of loose to watery BMs following meals averaging approximately 5-6 stools per day.  She reports her last BM was  yesterday described as dark in color.  She denies hematochezia or melena.  She denies NSAID or anticoagulant use.  She denies starting any other new medications recently to include herbal supplements.  She reports intermittent tobacco use and states that  she does drink wine daily 6 to 8 ounces. She denies any other complaints at this time.          Hospital Problems   Date Reviewed:  04/11/2018                         Codes  Class  Noted  POA              Acute pancreatitis  ICD-10-CM: K85.90   ICD-9-CM: 678.9    08/28/2018  Unknown                         Past Medical History:        Diagnosis  Date         ?  Ill-defined condition            bronchitis         ?  Pneumonia            Past Surgical History:         Procedure  Laterality  Date          ?  HX CESAREAN SECTION    2002, 2004, 2008          x 3          ?  HX CESAREAN SECTION         ?  HX GASTRIC BYPASS    [place]          ?  HX GASTRIC BYPASS              ?  HX GASTRIC BYPASS               Family History         Problem  Relation  Age of Onset          ?  Diabetes  Mother       ?  Hypertension  Father       ?  Heart Disease  Maternal Grandmother       ?  Heart Attack  Maternal Grandmother       ?  High Cholesterol  Maternal Grandmother       ?  Arthritis-osteo  Maternal Grandmother       ?  Stroke  Paternal Aunt            ?  Kidney Disease  Maternal Aunt            Social History          Tobacco Use         ?   Smoking status:  Former Smoker              Types:  Cigarettes         Last attempt to quit:  11/12/2009         Years since quitting:  8.8         ?  Smokeless tobacco:  Never Used       Substance Use Topics         ?  Alcohol use:  Not Currently              Alcohol/week:  0.0 - 4.0 standard drinks             Comment: occassional           Current Facility-Administered Medications             Medication  Dose  Route  Frequency  Provider  Last Rate  Last Dose              ?  ondansetron (ZOFRAN) injection 4 mg   4 mg  IntraVENous  Q6H PRN  Knox Saliva, MD     4 mg at 08/29/18 1458     ?  cefTRIAXone (ROCEPHIN) 1 g in 0.9% sodium chloride (MBP/ADV) 50 mL MBP   1 g  IntraVENous  Q24H  Simoes, Ruchita S, MD  100 mL/hr at 08/29/18 1045  1 g at 08/29/18 1045     ?  dextrose 5% and 0.9% NaCl infusion   100 mL/hr  IntraVENous  CONTINUOUS  Simoes, Ruchita S, MD  100 mL/hr at 08/29/18 1046  100 mL/hr at 08/29/18 1046     ?  morphine injection 2 mg   2 mg  IntraVENous  Q4H PRN  Simoes, Ruchita S, MD     2 mg at 08/29/18 1044     ?  promethazine (PHENERGAN) 12.5 mg in NS 50 mL IVPB   12.5 mg  IntraVENous  Q6H PRN  Simoes, Ruchita S, MD           ?  alum-mag hydroxide-simeth (MYLANTA) oral suspension 30 mL   30 mL  Oral  Q4H PRN  Simoes, Ruchita S, MD           ?  sodium chloride (NS) flush 5-10 mL   5-10 mL  IntraVENous  Q8H  Charlaine Dalton, MD     Stopped at 08/29/18 1400     ?  sodium chloride (  NS) flush 5-10 mL   5-10 mL  IntraVENous  PRN  Charlaine Dalton, MD     10 mL at 08/28/18 2137     ?  naloxone [facility]) injection 0.1 mg   0.1 mg  IntraVENous  PRN  Charlaine Dalton, MD           ?  acetaminophen (TYLENOL) tablet 650 mg   650 mg  Oral  Q6H PRN  Charlaine Dalton, MD                    ?  HYDROcodone-acetaminophen (NORCO) 5-325 mg per tablet 1 Tab   1 Tab  Oral  Q4H PRN  Charlaine Dalton, MD                    ?  alum-mag hydroxide-simeth (MYLANTA) oral suspension 30 mL   30 mL  Oral  Q4H PRN  Charlaine Dalton, MD     30 mL  at 08/29/18 1404     ?  albuterol (PROVENTIL VENTOLIN) nebulizer solution 2.5 mg   2.5 mg  Nebulization  Q6H PRN  Charlaine Dalton, MD           ?  heparin (porcine) injection 5,000 Units   5,000 Units  SubCUTAneous  Q8H  Charlaine Dalton, MD     5,000 Units at 08/29/18 1345              ?  pantoprazole (PROTONIX) 40 mg in 0.9% sodium chloride 10 mL injection   40 mg  IntraVENous  DAILY  Charlaine Dalton, MD     40 mg at 08/29/18 0840           Allergies        Allergen  Reactions         ?  Aspirin  Other (comments)             Stomach ulcer         ?  Ibuprofen  Unknown (comments)             Gastric Bypass--advised to avoid         ?  Nsaids (Non-Steroidal Anti-Inflammatory Drug)  Other (comments)             Hx gastric bypass        Review of Systems:   A 12 point review of systems were obtained with pertinent positives noted in above HPI, otherwise negative         Objective:        Patient Vitals for the past 24 hrs:            Temp  Pulse  Resp  BP  SpO2            08/29/18 1143  98 ??F (36.7 ??C)  79  16  114/67  98 %            08/29/18 0829  98.2 ??F (36.8 ??C)  75  16  105/65  100 %     08/29/18 0405  98.2 ??F (36.8 ??C)  76  16  104/64  100 %     08/28/18 2317  98.6 ??F (37 ??C)  71  16  111/66  100 %     08/28/18 2047  98.4 ??F (36.9 ??C)  74  16  119/73  100 %     08/28/18 1925  98 ??F (36.7 ??C)  --  --  --  --  08/28/18 1901  --  82  15  114/66  98 %     08/28/18 1831  --  84  21  104/53  95 %     08/28/18 1805  --  88  24  108/64  97 %     08/28/18 1732  --  71  12  108/75  98 %            08/28/18 1602  --  74  20  111/65  99 %        Physical Exam:   GENERAL: alert, pleasant, cooperative, no distress, appears  stated age   EYE:  No scleral icterus noted    LUNG: clear to auscultation bilaterally   HEART: S1, S2    ABDOMEN: soft, tender, bowel sounds  active. No palpable masses , hernias or organomegaly appreciated    EXTREMITIES: no edema   SKIN: no rash or abnormalities   NEUROLOGIC: AOx3      Diagnostic  Imaging:   Ct Abd Pelv W Cont      Result Date: 08/28/2018   EXAMINATION: CT ABD PELV W CONT CLINICAL INDICATION: Upper abdominal pain     COMPARISON:  April 25, 2018 TECHNIQUE:  All CT exams at this facility use one or more dose reduction techniques including automatic exposure control, ma/kV  adjustment per  patient's size, or iterative reconstruction technique. DICOM format image data available to non-affiliated external healthcare facilities or entities on a secure, media free, reciprocal searchable basis with patient authorization for 12 months following  the date of the study. Multiple contiguous axial CT images at 5  mm increments were obtained from the bases of the lungs to the  pubic symphysis with intravenous contrast. FINDINGS: Visualized lungs/chest: Unremarkable. Gallbladder normal. No intrahepatic  biliary duct dilatation. Hepatobiliary: Unremarkable. Gallbladder normal. Kidneys/ureters: Unremarkable. Stomach: Postop changes about the stomach. Inflammation within the duodenal sweep. Adrenal glands: Unremarkable. Pancreas: Pancreatic head and uncinate  process indistinct with adjacent inflammation. Pancreatic body and tail normal. No pancreatic duct dilatation. Previous pancreatic head pseudocyst has resolved. Spleen: Unremarkable. Vascular structures:  Unremarkable. Peritoneum/mesentery/bowel:  Unremarkable.  Retroperitoneal lymph nodes: Unremarkable Pelvis:  Left adnexal cystic lesion 36 x 29 mm, new. Appendix normal     Bones: No significant lesions. Miscellaneous: None.       IMPRESSION: 1. Pancreatitis 2. Resolution of pancreatic head pseudocyst. 3. Left adnexal cystic lesion, likely ovarian cyst. Recommend follow-up pelvic ultrasound in 6 weeks to ensure resolution.       [place] Ruq      Result Date: 08/28/2018   History: RUQ pain.          Impression: Fatty liver. Small gallbladder polyp. Stable peripancreatic head 3 cm fluid collection. Comment: Sonography of the right upper quadrant was performed.  This was compared with CT abdomen and pelvis April 25, 2018 and right upper quadrant  ultrasound April 08, 2018. The liver is of mild increased echotexture from fatty infiltration. The intra and extrahepatic ducts are of normal caliber. The common bile duct measures 4 millimeters. A 6 mm polyp is noted adherent to the gallbladder wall.  Otherwise the wall is of normal thickness. Sonographic Percell Miller sign is negative. No definite gallstones. No pericholecystic fluid. A 3.1 x 2.1 cm fluid collection is again noted anterior to the head of the pancreas unchanged from February 2020. tThe right  kidney is unremarkable..          Laboratory Results:  Labs:  Results:        Chemistry  Recent Labs         08/29/18   0235  08/28/18   1328      GLU  92  71*      NA  136  136      K  3.6  3.7      CL  108*  106      CO2  24  23      BUN  7  7      CREA  0.8  0.8      CA  7.9*  8.3*      AGAP  5  7      AP  154*  164*      TP  6.3*  7.3      ALB  2.6*  3.0*          Estimated Creatinine Clearance: 95.7 mL/min (based on SCr of 0.8 mg/dL).        CBC w/Diff  Recent Labs         08/29/18   0235  08/28/18   2139  08/28/18   1328      WBC  7.5   --   8.3      RBC  3.56*   --   3.87      HGB  11.6*  11.9*  12.8*      HCT  35.8*  36.0*  38.0      PLT  206   --   255      GRANS  71.0*   --   73.8*      LYMPH  21.6*   --   19.1*      EOS  1.1   --   0.2                 Cardiac Enzymes  No results for input(s): CPK, CKND1, MYO in the last 72 hours.      No lab exists for component: CKRMB, TROIP     Coagulation  No results for input(s): PTP, INR, APTT, INREXT in the last 72 hours.         Hepatitis Panel  Lab Results      Component  Value  Date/Time        Hep B surface Ag screen  Negative  12/20/2010 12:00 AM            Recent Labs         08/29/18   0235  08/28/18   1328      ALT  47  61      TBILI  1.0  0.5      AP  154*  164*      ALB  2.6*  3.0*      TP  6.3*  7.3                       Amylase Lipase          Liver Enzymes   Recent Labs         08/29/18   0235      TP  6.3*      ALB  2.6*      AP  154*      ALT  47                 Thyroid Studies  No results  for input(s): T4, T3U, TSH, TSHEXT in the last 72 hours.      No lab exists for component: T3RU          Pathology  pathology           Signed By:  Victorino Dike MS, APRN, FNP-C       August 29, 2018           Office: [phone]

## 2018-08-29 NOTE — Progress Notes (Signed)
Problem: Falls - Risk of  Goal: *Absence of Falls  Description: Document Ashley [name] Fall Risk and appropriate interventions in the flowsheet.  Outcome: Progressing Towards Goal  Note: Fall Risk Interventions:                 Elimination Interventions: Call light in reach, Bed/chair exit alarm, Patient to call for help with toileting needs              Problem: Pancreatitis  Goal: *Control of acute pain  Outcome: Progressing Towards Goal

## 2018-08-29 NOTE — Progress Notes (Signed)
[name] in [id number]: c/o nausea, indigestion. Zofran is q6, do you want to increase it? Also, can she have Tums or Maalox? Thanks, Surveyor, minerals, RN [id number]</SPAN>

## 2018-08-29 NOTE — Consults (Addendum)
Gastroenterology Consult    Patient: Ashley [name] MRN: [id number]  SSN: [id number]    Date of Birth: [birth year]  Age: 33 y.o.  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  Normal gallbladder.  Inflammation within the duodenal sweep.  Postop changes about the stomach.  Pancreatic head with use in a process in the setting with adjacent inflammation.  Pancreatic body and tail normal.  No pancreatic ductal dilatation.  Previous pancreatic head pseudocyst has resolved.  Spleen unremarkable.  Peritoneum/mesentery/bowel unremarkable.  Retroperitoneal lymph nodes unremarkable.   -[place] 08/28/2018 reviewed with Radiologist.  No evidence of fluid collection of pseudocyst identified on subsequent review of imaging with comparison. Small gallbladder polyp noted and fatty infiltration of the liver. CBD 4 mm. Intra-and extrahepatic ducts normal. No definite gallstones.  --Hx of possible sealed off duodenal ulcer perforation with possible abscess   --Elevated AST and alk phos suspect secondary to EtOH use. 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 Rule out B12 or folate deficiency  --Diarrhea rule out infectious source, pancreatic insufficiency or other etiology  --Roux-en-Y Gastric Bypass  --Family hx of Ulcerative Colitis-Mother  --Family hx Colon Cancer-Maternal Grandfather  Plan:   --Monitor LFTs, CBC, BMP, periodic lipase  --Start Protonix 3m BID   --Will order iron studies, B12, folate, acute hepatitis panel  --Will order stool studies to include stool for C. Diff and pancreatic elastase  --Continue IVF, trial full liquid diet as tolerated   --Monitor abdominal exam, Judicious pain management  --If no improvement in symptoms, may consider repeat EGD     --Start thiamine, folic acid and MVI  --More recommendations pending above results and pts clinical condition   Subjective:      Ashley BREISCHis a 33y.o. female who is being seen for worsening abdominal pain more prominent to the epigastric and right upper quadrant area over the last few days.  The patient states the pain was similar to her prior episode in January when she was noted to have pancreatitis thought to be secondary to ulcer formation MRI findings at that time indicating a possible sealed off duodenal ulcer perforation with possible abscess. [facility] states that she was not able to follow up as indicated. She states that her abdominal pain was intermittent since last hospitalization. She denies dizziness, SOB or chest pain. She reports nausea, vomiting x multiple episodes of bilious liquid with her last episode just prior to hospitalization. She denies hematemesis.  She reports a fair appetite and denies weight loss.  Her normal bowel pattern consist of daily to every other day firm to soft brown color stools but states that over the last few weeks she has been experiencing increase stool output consisting of loose to watery BMs following meals averaging approximately 5-6 stools per day.  She reports her last BM was yesterday described as dark in color.  She denies hematochezia or melena.  She denies NSAID or anticoagulant use.  She denies starting any other new medications recently to include herbal supplements.  She reports intermittent tobacco use and states that she does drink wine daily 6 to 8 ounces. She denies any other complaints at this time.     Hospital Problems  Date Reviewed: 04/11/2018          Codes Class Noted POA    Acute pancreatitis ICD-10-CM: K85.90  ICD-9-CM: 295.2  08/28/2018 Unknown            Past Medical History:   Diagnosis Date   ??? Ill-defined condition     bronchitis   ??? Pneumonia      Past Surgical History:   Procedure Laterality Date    ??? HX CESAREAN SECTION  2002, 2004, 2008    x 3   ??? HX CESAREAN SECTION     ??? HX GASTRIC BYPASS  10-2009    [facility]   ??? HX GASTRIC BYPASS     ??? HX GASTRIC BYPASS        Family History   Problem Relation Age of Onset   ??? Diabetes Mother    ??? Hypertension Father    ??? Heart Disease Maternal Grandmother    ??? Heart Attack Maternal Grandmother    ??? High Cholesterol Maternal Grandmother    ??? Arthritis-osteo Maternal Grandmother    ??? Stroke Paternal Aunt    ??? Kidney Disease Maternal Aunt      Social History     Tobacco Use   ??? Smoking status: Former Smoker     Types: Cigarettes     Last attempt to quit: 11/12/2009     Years since quitting: 8.8   ??? Smokeless tobacco: Never Used   Substance Use Topics   ??? Alcohol use: Not Currently     Alcohol/week: 0.0 - 4.0 standard drinks     Comment: occassional      Current Facility-Administered Medications   Medication Dose Route Frequency Provider Last Rate Last Dose   ??? ondansetron (ZOFRAN) injection 4 mg  4 mg IntraVENous Q6H PRN Knox Saliva, MD   4 mg at 08/29/18 1458   ??? cefTRIAXone (ROCEPHIN) 1 g in 0.9% sodium chloride (MBP/ADV) 50 mL MBP  1 g IntraVENous Q24H Simoes, Ruchita S, MD 100 mL/hr at 08/29/18 1045 1 g at 08/29/18 1045   ??? dextrose 5% and 0.9% NaCl infusion  100 mL/hr IntraVENous CONTINUOUS Simoes, Ruchita S, MD 100 mL/hr at 08/29/18 1046 100 mL/hr at 08/29/18 1046   ??? morphine injection 2 mg  2 mg IntraVENous Q4H PRN Simoes, Ruchita S, MD   2 mg at 08/29/18 1044   ??? promethazine (PHENERGAN) 12.5 mg in NS 50 mL IVPB  12.5 mg IntraVENous Q6H PRN Simoes, Ruchita S, MD       ??? alum-mag hydroxide-simeth (MYLANTA) oral suspension 30 mL  30 mL Oral Q4H PRN Simoes, Ruchita S, MD       ??? sodium chloride (NS) flush 5-10 mL  5-10 mL IntraVENous Q8H Charlaine Dalton, MD   Stopped at 08/29/18 1400   ??? sodium chloride (NS) flush 5-10 mL  5-10 mL IntraVENous PRN Charlaine Dalton, MD   10 mL at 08/28/18 2137   ??? naloxone (NARCAN) injection 0.1 mg  0.1 mg IntraVENous PRN Charlaine Dalton, MD       ??? acetaminophen (TYLENOL) tablet 650 mg  650 mg Oral Q6H PRN Charlaine Dalton, MD       ??? HYDROcodone-acetaminophen (NORCO) 5-325 mg per tablet 1 Tab  1 Tab Oral Q4H PRN Charlaine Dalton, MD       ??? alum-mag hydroxide-simeth (MYLANTA) oral suspension 30 mL  30 mL Oral Q4H PRN Charlaine Dalton, MD   30 mL at 08/29/18 1404   ??? albuterol (PROVENTIL  VENTOLIN) nebulizer solution 2.5 mg  2.5 mg Nebulization Q6H PRN Charlaine Dalton, MD       ??? heparin (porcine) injection 5,000 Units  5,000 Units SubCUTAneous Q8H Charlaine Dalton, MD   5,000 Units at 08/29/18 1345   ??? pantoprazole (PROTONIX) 40 mg in 0.9% sodium chloride 10 mL injection  40 mg IntraVENous DAILY Charlaine Dalton, MD   40 mg at 08/29/18 0840      Allergies   Allergen Reactions   ??? Aspirin Other (comments)     Stomach ulcer   ??? Ibuprofen Unknown (comments)     Gastric Bypass--advised to avoid   ??? Nsaids (Non-Steroidal Anti-Inflammatory Drug) Other (comments)     Hx gastric bypass     Review of Systems:  A 12 point review of systems were obtained with pertinent positives noted in above HPI, otherwise negative     Objective:     Patient Vitals for the past 24 hrs:   Temp Pulse Resp BP SpO2   08/29/18 1143 98 ??F (36.7 ??C) 79 16 114/67 98 %   08/29/18 0829 98.2 ??F (36.8 ??C) 75 16 105/65 100 %   08/29/18 0405 98.2 ??F (36.8 ??C) 76 16 104/64 100 %   08/28/18 2317 98.6 ??F (37 ??C) 71 16 111/66 100 %   08/28/18 2047 98.4 ??F (36.9 ??C) 74 16 119/73 100 %   08/28/18 1925 98 ??F (36.7 ??C) ??? ??? ??? ???   08/28/18 1901 ??? 82 15 114/66 98 %   08/28/18 1831 ??? 84 21 104/53 95 %   08/28/18 1805 ??? 88 24 108/64 97 %   08/28/18 1732 ??? 71 12 108/75 98 %   08/28/18 1602 ??? 74 20 111/65 99 %     Physical Exam:  GENERAL: alert, pleasant, cooperative, no distress, appears stated age  EYE:  No scleral icterus noted   LUNG: clear to auscultation bilaterally  HEART: S1, S2   ABDOMEN: soft, tender, bowel sounds active. No palpable masses, hernias or organomegaly appreciated    EXTREMITIES: no edema  SKIN: no rash or abnormalities  NEUROLOGIC: AOx3    Diagnostic Imaging:  Ct Abd Pelv W Cont    Result Date: 08/28/2018  EXAMINATION: CT ABD PELV W CONT CLINICAL INDICATION: Upper abdominal pain     COMPARISON:  April 25, 2018 TECHNIQUE:  All CT exams at this facility use one or more dose reduction techniques including automatic exposure control, ma/kV  adjustment per patient's size, or iterative reconstruction technique. DICOM format image data available to non-affiliated external healthcare facilities or entities on a secure, media free, reciprocal searchable basis with patient authorization for 12 months following the date of the study. Multiple contiguous axial CT images at 5  mm increments were obtained from the bases of the lungs to the  pubic symphysis with intravenous contrast. FINDINGS: Visualized lungs/chest: Unremarkable. Gallbladder normal. No intrahepatic biliary duct dilatation. Hepatobiliary: Unremarkable. Gallbladder normal. Kidneys/ureters: Unremarkable. Stomach: Postop changes about the stomach. Inflammation within the duodenal sweep. Adrenal glands: Unremarkable. Pancreas: Pancreatic head and uncinate process indistinct with adjacent inflammation. Pancreatic body and tail normal. No pancreatic duct dilatation. Previous pancreatic head pseudocyst has resolved. Spleen: Unremarkable. Vascular structures:  Unremarkable. Peritoneum/mesentery/bowel:  Unremarkable. Retroperitoneal lymph nodes: Unremarkable Pelvis:  Left adnexal cystic lesion 36 x 29 mm, new. Appendix normal     Bones: No significant lesions. Miscellaneous: None.     IMPRESSION: 1. Pancreatitis 2. Resolution of pancreatic head pseudocyst. 3. Left adnexal cystic lesion, likely ovarian cyst. Recommend  follow-up pelvic ultrasound in 6 weeks to ensure resolution.     [place] Ruq    Result Date: 08/28/2018  History: RUQ pain.        Impression: Fatty liver. Small gallbladder polyp. Stable peripancreatic  head 3 cm fluid collection. Comment: Sonography of the right upper quadrant was performed. This was compared with CT abdomen and pelvis April 25, 2018 and right upper quadrant ultrasound April 08, 2018. The liver is of mild increased echotexture from fatty infiltration. The intra and extrahepatic ducts are of normal caliber. The common bile duct measures 4 millimeters. A 6 mm polyp is noted adherent to the gallbladder wall. Otherwise the wall is of normal thickness. Sonographic Percell Miller sign is negative. No definite gallstones. No pericholecystic fluid. A 3.1 x 2.1 cm fluid collection is again noted anterior to the head of the pancreas unchanged from February 2020. tThe right kidney is unremarkable..       Laboratory Results:    Labs: Results:   Chemistry Recent Labs     08/29/18  0235 08/28/18  1328   GLU 92 71*   NA 136 136   K 3.6 3.7   CL 108* 106   CO2 24 23   BUN 7 7   CREA 0.8 0.8   CA 7.9* 8.3*   AGAP 5 7   AP 154* 164*   TP 6.3* 7.3   ALB 2.6* 3.0*    Estimated Creatinine Clearance: 95.7 mL/min (based on SCr of 0.8 mg/dL).   CBC w/Diff Recent Labs     08/29/18  0235 08/28/18  2139 08/28/18  1328   WBC 7.5  --  8.3   RBC 3.56*  --  3.87   HGB 11.6* 11.9* 12.8*   HCT 35.8* 36.0* 38.0   PLT 206  --  255   GRANS 71.0*  --  73.8*   LYMPH 21.6*  --  19.1*   EOS 1.1  --  0.2      Cardiac Enzymes No results for input(s): CPK, CKND1, MYO in the last 72 hours.    No lab exists for component: CKRMB, TROIP   Coagulation No results for input(s): PTP, INR, APTT, INREXT in the last 72 hours.    Hepatitis Panel Lab Results   Component Value Date/Time    Hep B surface Ag screen Negative 12/20/2010 12:00 AM     Recent Labs     08/29/18  0235 08/28/18  1328   ALT 47 61   TBILI 1.0 0.5   AP 154* 164*   ALB 2.6* 3.0*   TP 6.3* 7.3         Amylase Lipase    Liver Enzymes Recent Labs     08/29/18  0235   TP 6.3*   ALB 2.6*   AP 154*   ALT 47      Thyroid Studies No results for input(s): T4, T3U, TSH, TSHEXT in the last  72 hours.    No lab exists for component: T3RU     Pathology pathology     Signed By: Victorino Dike MS, APRN, FNP-C    August 29, 2018     Office: [phone]

## 2018-08-29 NOTE — Progress Notes (Signed)
INTERNAL MEDICINE PROGRESS NOTE    Date of note:      August 29, 2018    Patient:               Ashley [name], 33 y.o., female  Admit Date:        08/28/2018  Length of Stay:  1 day(s)    Problem List:   Patient Active Problem List   Diagnosis Code   ??? Previous cesarean delivery, antepartum condition or complication W29.562   ??? Bariatric surgery status complicating pregnancy, childbirth, or the puerperium, antepartum condition or complication Z30.865   ??? Intra-abdominal abscess ([place]) K65.1   ??? IV infiltrate T80.1XXA   ??? Duodenitis K29.80   ??? Acute pancreatitis K85.90       Subjective + interval history     Has abdominal pain in lower abdomen, bladder pressure  Had diarrhea prior to admission. No BM today    Assessment :     1. Presented with RUQ and epigastric pain  2. Acute pancreatitis  3. Elevated LFT  4. Diarrhea for 2 weeks prior to admission, no recent antibiotic use  5. Hospitalization in Jan 2020 for peripancreatic cyst/ abscess - presumed to be due to ruptured duodenal ulcer. Treated with antibiotics  6. 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 Obesity BMI 36  9. Daily alcohol use  10. Left adnexal cyst - likely ovarian cyst - outpatient follow up    Plan :     Continue bowel rest, judicious pain management. IV fluids    RUQ ultrasound - Fatty liver. Small gallbladder polyp. Stable peripancreatic head 3 cm fluid collection.  However, CT abdomen - . Pancreatitis ;Resolution of pancreatic head pseudocyst    After last hospitalization in Jan 2020, patient was supposed to follow up with general sugery Dr. Laurance Flatten to schedule lap cholecystectomy and GI Dr. Delma Freeze. But she did not follow up     I have consulted both general surgery and GI    PPI for GI ppx    Ceftriaxone for UTI. Follow urine culture    Check C. Diff if diarrhea persists    Patient has been counseled to quit drinking alcohol     She says she will update her family      - Code status: full code     Recommend to continue hospitalization.  Expected date of discharge: tbd  Plan for disposition - tbd    Objective:     Visit Vitals  BP 105/65 (BP 1 Location: Right arm, BP Patient Position: Supine)   Pulse 75   Temp 98.2 ??F (36.8 ??C)   Resp 16   Ht 5' (1.524 m)   Wt 84.8 kg (187 lb)   SpO2 100%   BMI 36.52 kg/m??         No intake or output data in the 24 hours ending 08/29/18 0917    Physical Exam:     GEN - AAOx3, NAD  HEENT -  mucous membranes moist  Neck - supple, no JVD  Cardiac - RRR, S1, S2, no murmurs  Chest/Lungs - clear to auscultation without wheezes or rhonchi  Abdomen - soft, tenderness in suprapubic region, epigastric region and RUQ . No guarding  Extremities - no clubbing/ cyanosis/ edema  Neuro - CN 2-12 intact. No focal deficits. No motor or sensory deficit appreciated.   Skin - no rashes or lesions    Current medications:  Current Facility-Administered Medications:   ???  ondansetron (ZOFRAN) injection 4 mg, 4 mg, IntraVENous, Q6H PRN, Knox Saliva, MD, 4 mg at 08/29/18 0840  ???  ferrous sulfate tablet 325 mg, 325 mg, Oral, DAILY WITH BREAKFAST, Charlaine Dalton, MD, Stopped at 08/29/18 0800  ???  sodium chloride (NS) flush 5-10 mL, 5-10 mL, IntraVENous, Q8H, Charlaine Dalton, MD, 10 mL at 08/28/18 2138  ???  sodium chloride (NS) flush 5-10 mL, 5-10 mL, IntraVENous, PRN, Charlaine Dalton, MD, 10 mL at 08/28/18 2137  ???  naloxone (NARCAN) injection 0.1 mg, 0.1 mg, IntraVENous, PRN, Charlaine Dalton, MD  ???  acetaminophen (TYLENOL) tablet 650 mg, 650 mg, Oral, Q6H PRN, Charlaine Dalton, MD  ???  HYDROcodone-acetaminophen (NORCO) 5-325 mg per tablet 1 Tab, 1 Tab, Oral, Q4H PRN, Charlaine Dalton, MD  ???  morphine injection 2-4 mg, 2-4 mg, IntraVENous, Q4H PRN, Charlaine Dalton, MD, 2 mg at 08/29/18 6160  ???  alum-mag hydroxide-simeth (MYLANTA) oral suspension 30 mL, 30 mL, Oral, Q4H PRN, Charlaine Dalton, MD  ???  albuterol (PROVENTIL VENTOLIN) nebulizer solution 2.5 mg, 2.5 mg, Nebulization, Q6H PRN, Charlaine Dalton, MD   ???  heparin (porcine) injection 5,000 Units, 5,000 Units, SubCUTAneous, Q8H, Charlaine Dalton, MD, Stopped at 08/29/18 0500  ???  0.9% sodium chloride infusion, 150 mL/hr, IntraVENous, CONTINUOUS, Charlaine Dalton, MD, Last Rate: 150 mL/hr at 08/29/18 0300, 150 mL/hr at 08/29/18 0300  ???  pantoprazole (PROTONIX) 40 mg in 0.9% sodium chloride 10 mL injection, 40 mg, IntraVENous, DAILY, Charlaine Dalton, MD, 40 mg at 08/29/18 0840    Labs:     Recent Results (from the past 24 hour(s))   CBC WITH AUTOMATED DIFF    Collection Time: 08/28/18  1:28 PM   Result Value Ref Range    WBC 8.3 4.0 - 11.0 1000/mm3    RBC 3.87 3.60 - 5.20 M/uL    HGB 12.8 (L) 13.0 - 17.2 gm/dl    HCT 38.0 37.0 - 50.0 %    MCV 98.2 (H) 80.0 - 98.0 fL    MCH 33.1 25.4 - 34.6 pg    MCHC 33.7 30.0 - 36.0 gm/dl    PLATELET 255 140 - 450 1000/mm3    MPV 10.7 (H) 6.0 - 10.0 fL    RDW-SD 54.8 (H) 36.4 - 46.3      NRBC 0 0 - 0      IMMATURE GRANULOCYTES 0.4 0.0 - 3.0 %    NEUTROPHILS 73.8 (H) 34 - 64 %    LYMPHOCYTES 19.1 (L) 28 - 48 %    MONOCYTES 6.4 1 - 13 %    EOSINOPHILS 0.2 0 - 5 %    BASOPHILS 0.1 0 - 3 %   METABOLIC PANEL, COMPREHENSIVE    Collection Time: 08/28/18  1:28 PM   Result Value Ref Range    Sodium 136 136 - 145 mEq/L    Potassium 3.7 3.5 - 5.1 mEq/L    Chloride 106 98 - 107 mEq/L    CO2 23 21 - 32 mEq/L    Glucose 71 (L) 74 - 106 mg/dl    BUN 7 7 - 25 mg/dl    Creatinine 0.8 0.6 - 1.3 mg/dl    GFR est AA >60.0      GFR est non-AA >60      Calcium 8.3 (L) 8.5 - 10.1 mg/dl    AST (SGOT) 72 (H) 15 - 37 U/L    ALT (SGPT) 61 12 - 78 U/L  Alk. phosphatase 164 (H) 45 - 117 U/L    Bilirubin, total 0.5 0.2 - 1.0 mg/dl    Protein, total 7.3 6.4 - 8.2 gm/dl    Albumin 3.0 (L) 3.4 - 5.0 gm/dl    Anion gap 7 5 - 15 mmol/L   LIPASE    Collection Time: 08/28/18  1:28 PM   Result Value Ref Range    Lipase 396 (H) 73 - 393 U/L   TROPONIN I    Collection Time: 08/28/18  1:28 PM   Result Value Ref Range    Troponin-I <0.015 0.000 - 0.045 ng/ml   CULTURE, URINE     Collection Time: 08/28/18  1:35 PM   Result Value Ref Range    Isolate (A)       >100,000 CFU/mL  Gram Negative Bacilli Isolated  Identification And Susceptibility To Follow     POC HCG,URINE    Collection Time: 08/28/18  1:40 PM   Result Value Ref Range    HCG urine, QL negative NEGATIVE,Negative,negative     EKG, 12 LEAD, INITIAL    Collection Time: 08/28/18  1:41 PM   Result Value Ref Range    Ventricular Rate 88 BPM    Atrial Rate 88 BPM    P-R Interval 140 ms    QRS Duration 82 ms    Q-T Interval 362 ms    QTC Calculation (Bezet) 438 ms    Calculated P Axis 53 degrees    Calculated R Axis 23 degrees    Calculated T Axis 23 degrees    Diagnosis       Normal sinus rhythm  Normal ECG  When compared with ECG of 19-Mar-2017 10:43,  No significant change since prior tracing  Confirmed by Marolyn Haller, M.D., Maeola Harman ([id number]) on 08/28/2018 5:54:53 PM     POC URINE MACROSCOPIC    Collection Time: 08/28/18  1:46 PM   Result Value Ref Range    Glucose Negative NEGATIVE,Negative mg/dl    Bilirubin Negative NEGATIVE,Negative      Ketone Negative NEGATIVE,Negative mg/dl    Specific gravity 1.025 1.005 - 1.030      Blood Negative NEGATIVE,Negative      pH (UA) 6.0 5 - 9      Protein Trace (A) NEGATIVE,Negative mg/dl    Urobilinogen 0.2 0.0 - 1.0 EU/dl    Nitrites Positive (A) NEGATIVE,Negative      Leukocyte Esterase Negative NEGATIVE,Negative      Color Dark yellow      Appearance Cloudy     POC URINE MICROSCOPIC    Collection Time: 08/28/18  1:46 PM   Result Value Ref Range    Epithelial cells, squamous 5-9 /LPF    WBC 1-4 /HPF    Bacteria 4+ /HPF   TROPONIN I    Collection Time: 08/28/18  4:30 PM   Result Value Ref Range    Troponin-I <0.015 0.000 - 0.045 ng/ml   TROPONIN I    Collection Time: 08/28/18  7:40 PM   Result Value Ref Range    Troponin-I <0.015 0.000 - 0.045 ng/ml   HGB & HCT    Collection Time: 08/28/18  9:39 PM   Result Value Ref Range    HGB 11.9 (L) 13.0 - 17.2 gm/dl    HCT 36.0 (L) 37.0 - 50.0 %    CBC WITH AUTOMATED DIFF    Collection Time: 08/29/18  2:35 AM   Result Value Ref Range    WBC 7.5 4.0 - 11.0  1000/mm3    RBC 3.56 (L) 3.60 - 5.20 M/uL    HGB 11.6 (L) 13.0 - 17.2 gm/dl    HCT 35.8 (L) 37.0 - 50.0 %    MCV 100.6 (H) 80.0 - 98.0 fL    MCH 32.6 25.4 - 34.6 pg    MCHC 32.4 30.0 - 36.0 gm/dl    PLATELET 206 140 - 450 1000/mm3    MPV 11.1 (H) 6.0 - 10.0 fL    RDW-SD 55.6 (H) 36.4 - 46.3      NRBC 0 0 - 0      IMMATURE GRANULOCYTES 0.3 0.0 - 3.0 %    NEUTROPHILS 71.0 (H) 34 - 64 %    LYMPHOCYTES 21.6 (L) 28 - 48 %    MONOCYTES 5.7 1 - 13 %    EOSINOPHILS 1.1 0 - 5 %    BASOPHILS 0.3 0 - 3 %   METABOLIC PANEL, COMPREHENSIVE    Collection Time: 08/29/18  2:35 AM   Result Value Ref Range    Sodium 136 136 - 145 mEq/L    Potassium 3.6 3.5 - 5.1 mEq/L    Chloride 108 (H) 98 - 107 mEq/L    CO2 24 21 - 32 mEq/L    Glucose 92 74 - 106 mg/dl    BUN 7 7 - 25 mg/dl    Creatinine 0.8 0.6 - 1.3 mg/dl    GFR est AA >60.0      GFR est non-AA >60      Calcium 7.9 (L) 8.5 - 10.1 mg/dl    AST (SGOT) 49 (H) 15 - 37 U/L    ALT (SGPT) 47 12 - 78 U/L    Alk. phosphatase 154 (H) 45 - 117 U/L    Bilirubin, total 1.0 0.2 - 1.0 mg/dl    Protein, total 6.3 (L) 6.4 - 8.2 gm/dl    Albumin 2.6 (L) 3.4 - 5.0 gm/dl    Anion gap 5 5 - 15 mmol/L       XR Results:  Results from Hospital Encounter encounter on 08/28/18   XR CHEST SNGL V    Narrative Indication: chest pain.    .      Impression IMPRESSION: Portable AP upright view the chest exposed at 1:44 PM August 28, 2018  reveals the lungs are clear and the heart is of normal size.Marland Kitchen  No change from  March 19, 2017.         CT Results:  Results from Hospital Encounter encounter on 08/28/18   CT ABD PELV W CONT    Narrative EXAMINATION: CT ABD PELV W CONT    CLINICAL INDICATION: Upper abdominal pain         COMPARISON:  April 25, 2018    TECHNIQUE:  All CT exams at this facility use one or more dose reduction  techniques including automatic exposure control, ma/kV  adjustment per  patient's  size, or iterative reconstruction technique. DICOM format image data available  to non-affiliated external healthcare facilities or entities on a secure, media  free, reciprocal searchable basis with patient authorization for 12 months  following the date of the study.    Multiple contiguous axial CT images at 5  mm increments were obtained from the  bases of the lungs to the  pubic symphysis with intravenous contrast.    FINDINGS:    Visualized lungs/chest: Unremarkable. Gallbladder normal. No intrahepatic  biliary duct dilatation.    Hepatobiliary: Unremarkable. Gallbladder normal.    Kidneys/ureters:  Unremarkable.    Stomach: Postop changes about the stomach. Inflammation within the duodenal  sweep.    Adrenal glands: Unremarkable.    Pancreas: Pancreatic head and uncinate process indistinct with adjacent  inflammation. Pancreatic body and tail normal. No pancreatic duct dilatation.  Previous pancreatic head pseudocyst has resolved.    Spleen: Unremarkable.    Vascular structures:  Unremarkable.    Peritoneum/mesentery/bowel:  Unremarkable.    Retroperitoneal lymph nodes: Unremarkable    Pelvis:  Left adnexal cystic lesion 36 x 29 mm, new. Appendix normal         Bones: No significant lesions.    Miscellaneous: None.      Impression IMPRESSION:     1. Pancreatitis  2. Resolution of pancreatic head pseudocyst.  3. Left adnexal cystic lesion, likely ovarian cyst. Recommend follow-up pelvic  ultrasound in 6 weeks to ensure resolution.         MRI Results:  Results from Hospital Encounter encounter on 04/03/18   MRI ABD W WO CONT    Narrative MRI ABDOMEN WITH AND WITHOUT CONTRAST    CLINICAL HISTORY: Right upper quadrant pain    COMPARISON: CT dated 04/03/2018    TECHNIQUE: Multiplanar, multisequence imaging of the upper abdomen was performed  before and after administration of intravenous contrast.    FINDINGS:    Subsegmental atelectasis left greater than right lung base.     2.7 cm focal lesion within the left hepatic lobe, adjacent to the falciform  ligament is consistent with focal hepatic steatosis. This region manifested as  signal dropout on the out of phase sequence. Liver, otherwise grossly  unremarkable. Gallbladder, spleen, adrenal glands and kidneys grossly  unremarkable. Gastric bypass surgery. Inflammatory change adjacent to the  pancreatic head and uncinate process. There is also thickening of the distal  second and proximal third portions of the duodenum. Adjacent to the junction of  the second and third portions of the duodenum, there is a peripherally enhancing  8.5 x 4.6 x 2.8 cm (ML by CC by AP) fluid collection. The fluid collection is  intimate with the inferior margin of the duodenum within this region, and  extends to the right anterior perirenal fascia. This is concerning for a  developing abscess. This may be related to a contained, ruptured ulcer. Other  differential considerations may include pancreatic pseudocyst with possible  superimposed inflammation of the pancreatic head/uncinate process.      Impression IMPRESSION:  1. Lesion within the liver visualized on previous CT is most consistent with  focal fatty infiltration. No aggressive process.  2. Prominent peripherally enhancing fluid collection adjacent to the pancreatic  head/uncinate process with extensive surrounding inflammatory change as  described. This is concerning for a developing abscess. This may be related to a  contained, ruptured ulcer. Other differential considerations may include  pancreatic pseudocyst with possible superimposed inflammation of the pancreatic  head/uncinate process.  3. Subsegmental atelectasis within the lung bases.         Nuclear Medicine Results:  No results found for this or any previous visit.    [place] Results:  Results from [place] encounter on 08/28/18   [place] RUQ    Narrative History: RUQ pain.           Impression Impression: Fatty liver. Small gallbladder polyp. Stable peripancreatic head 3  cm fluid collection.    Comment: Sonography of the right upper quadrant was performed. This was compared  with CT abdomen and pelvis April 25, 2018 and  right upper quadrant ultrasound  April 08, 2018.    The liver is of mild increased echotexture from fatty infiltration. The intra  and extrahepatic ducts are of normal caliber. The common bile duct measures 4  millimeters. A 6 mm polyp is noted adherent to the gallbladder wall. Otherwise  the wall is of normal thickness. Sonographic Percell Miller sign is negative. No  definite gallstones. No pericholecystic fluid. A 3.1 x 2.1 cm fluid collection  is again noted anterior to the head of the pancreas unchanged from February  2020. tThe right kidney is unremarkable..         IR Results:  No results found for this or any previous visit.    VAS/US Results:  Results from Hospital Encounter encounter on 01/20/18   DUPLEX LOWER EXT VENOUS RIGHT    Narrative                                                               Study ID:   [id number]                                                 [facility]                                            [address] [place],                                          [place]  Lower Extremity Venous Report    Name: JOZETTE, CASTRELLON Date: 01/20/2018 08:00 PM  MRN: [id number]                      Patient Location: [id number]  DOB: May 23, 1985                  Age: 10 yrs  Gender: Female                   Account #: [id number]  Reason For Study: Right leg pain  Ordering Physician: Johnella Moloney     Performed By: Lowella Dell, RVS    Interpretation Summary  No evidence of superficial or deep vein thrombosis noted in the right lower  extremity.  No evidence of deep venous thrombosis in the contralateral/left common   femoral  vein.  _____________________________________________________________________________  __      QUALITY/PROCEDURE  Limited unilateral venous duplex performed of the right leg. Quality of the  study is good. M79.604.    HISTORY/SYMPTOMS  Right leg pain.    RIGHT LEG  The common femoral, femoral, popliteal, posterior tibial and peroneal veins  were examined with duplex ultrasound. The deep veins were patent and  compressible with no evidence of intraluminal thrombus. Spontaneous, phasic  venous flow with normal augmentation noted throughout the right leg.    RIGHT SAPHENOUS VEINS  Spectral Doppler exam demonstrates normal venous flow in the right great  saphenous vein. Compression of the right great saphenous vein is complete.  Filling defects in the right great saphenous vein are absent.      LEFT LEG  Spectral Doppler exam demonstrates normal venous flow in the left common  femoral vein. Compression of the left common femoral vein is complete.   Filling  defects in the left common femoral vein are absent.      Electronically signed byDR Wenda Low, MD   01/21/2018 01:10 AM         Total clinical care time was 40   minutes of which more than 50% was spent in coordination of care and counseling (time spent with patient/family face to face, physical exam, reviewing laboratory and imaging investigations, speaking with physicians and nursing staff involved in this patient's care).     Perfecto Kingdom,  MD  [facility]  August 29, 2018  Time: 9:17 AM

## 2018-08-29 NOTE — Progress Notes (Signed)
Problem: Falls - Risk of  Goal: *Absence of Falls  Description: Document Schmid Fall Risk and appropriate interventions in the flowsheet.  Outcome: Progressing Towards Goal  Note: Fall Risk Interventions:                 Elimination Interventions: Call light in reach, Bed/chair exit alarm, Patient to call for help with toileting needs              Problem: Pancreatitis  Goal: *Control of acute pain  Outcome: Progressing Towards Goal

## 2018-08-29 NOTE — Other (Signed)
Bedside shift change report given to [employer] representative (oncoming nurse) by Almira Bar (offgoing nurse). Report included the following information SBAR, Kardex, MAR and Recent Results.

## 2018-08-29 NOTE — Consults (Signed)
Surgery Consult      Patient: Ashley [name]               Sex: female          DOA: 08/28/2018       Date of Birth:  1986-01-02      Age:  33 y.o.        LOS:  LOS: 1 day       HPI:      Ashley [name] is a 33 y.o. female who presents with abdominal pain.  Patient was diagnosed with pancreatitis in February.  At that time had a CAT scan that showed a large pseudocyst as well as questionable abscess.  Patient resolved from this.  Patient had an ultrasound and a CAT scan that showed a 6 mm polyp versus stone.  Patient was to follow-up with Dr. Laurance Flatten as an outpatient for possible cholecystectomy.  Patient now presents again to the emergency room with abdominal pain.  Patient was found to have slightly elevated pancreatic enzymes.  This pseudocyst has improved.  Patient states most of her pain is in the epigastric right upper quadrant    Past Medical History:   Diagnosis Date   ??? Ill-defined condition     bronchitis   ??? Pneumonia      Past Surgical History:   Procedure Laterality Date   ??? HX CESAREAN SECTION  2002, 2004, 2008    x 3   ??? HX CESAREAN SECTION     ??? HX GASTRIC BYPASS  10-2009    [facility]   ??? HX GASTRIC BYPASS     ??? HX GASTRIC BYPASS        Family History   Problem Relation Age of Onset   ??? Diabetes Mother    ??? Hypertension Father    ??? Heart Disease Maternal Grandmother    ??? Heart Attack Maternal Grandmother    ??? High Cholesterol Maternal Grandmother    ??? Arthritis-osteo Maternal Grandmother    ??? Stroke Paternal Aunt    ??? Kidney Disease Maternal Aunt      Social History     Socioeconomic History   ??? Marital status: SINGLE     Spouse name: Not on file   ??? Number of children: Not on file   ??? Years of education: Not on file   ??? Highest education level: Not on file   Tobacco Use   ??? Smoking status: Former Smoker     Types: Cigarettes     Last attempt to quit: 11/12/2009     Years since quitting: 8.8   ??? Smokeless tobacco: Never Used   Substance and Sexual Activity   ??? Alcohol use: Not Currently      Alcohol/week: 0.0 - 4.0 standard drinks     Comment: occassional   ??? Drug use: No   ??? Sexual activity: Yes     Partners: Male     Birth control/protection: None   Social History Narrative    ** Merged History Encounter **           Current Facility-Administered Medications   Medication Dose Route Frequency   ??? ondansetron (ZOFRAN) injection 4 mg  4 mg IntraVENous Q6H PRN   ??? cefTRIAXone (ROCEPHIN) 1 g in 0.9% sodium chloride (MBP/ADV) 50 mL MBP  1 g IntraVENous Q24H   ??? dextrose 5% and 0.9% NaCl infusion  100 mL/hr IntraVENous CONTINUOUS   ??? morphine injection 2 mg  2 mg IntraVENous  Q4H PRN   ??? promethazine (PHENERGAN) 12.5 mg in NS 50 mL IVPB  12.5 mg IntraVENous Q6H PRN   ??? alum-mag hydroxide-simeth (MYLANTA) oral suspension 30 mL  30 mL Oral Q4H PRN   ??? sodium chloride (NS) flush 5-10 mL  5-10 mL IntraVENous Q8H   ??? sodium chloride (NS) flush 5-10 mL  5-10 mL IntraVENous PRN   ??? naloxone (NARCAN) injection 0.1 mg  0.1 mg IntraVENous PRN   ??? acetaminophen (TYLENOL) tablet 650 mg  650 mg Oral Q6H PRN   ??? HYDROcodone-acetaminophen (NORCO) 5-325 mg per tablet 1 Tab  1 Tab Oral Q4H PRN   ??? alum-mag hydroxide-simeth (MYLANTA) oral suspension 30 mL  30 mL Oral Q4H PRN   ??? albuterol (PROVENTIL VENTOLIN) nebulizer solution 2.5 mg  2.5 mg Nebulization Q6H PRN   ??? heparin (porcine) injection 5,000 Units  5,000 Units SubCUTAneous Q8H   ??? pantoprazole (PROTONIX) 40 mg in 0.9% sodium chloride 10 mL injection  40 mg IntraVENous DAILY      Allergies   Allergen Reactions   ??? Aspirin Other (comments)     Stomach ulcer   ??? Ibuprofen Unknown (comments)     Gastric Bypass--advised to avoid   ??? Nsaids (Non-Steroidal Anti-Inflammatory Drug) Other (comments)     Hx gastric bypass       Review of Systems:  A comprehensive review of systems was negative except for that written in the History of Present Illness.    Physical Exam:        Patient Vitals for the past 8 hrs:   BP Temp Pulse Resp SpO2    08/29/18 1143 114/67 98 ??F (36.7 ??C) 79 16 98 %   08/29/18 0829 105/65 98.2 ??F (36.8 ??C) 75 16 100 %       Temp (24hrs), Avg:98.2 ??F (36.8 ??C), Min:98 ??F (36.7 ??C), Max:98.6 ??F (37 ??C)      Physical Exam:  Normocephalic atraumatic no cervical adenopathy sclera icteric there is no thyromegaly her extract muscles are intact mucous membranes are moist her heart regular rate and rhythm lungs are clear to auscultation her abdomen is positive bowel sounds soft tenderness in the epigastric region no peritoneal signs no masses    Labs Reviewed:  All lab results for the last 24 hours reviewed.      Assessment:     Active Problems:    Acute pancreatitis (08/28/2018)          Plan:     33 year old female with a history of pancreatitis with pseudocyst questionable abscess which has mostly resolved patient now has returned with abdominal pain again elevated slightly pancreatic enzymes.  Patient had CT scan and ultrasound.  Ultrasound shows a 6 mm polyp in the gallbladder.  At the present time I agree with having GI evaluation for questionable bypass ulcer  Versus regular ulcer.  Patient would probably benefit from laparoscopic cholecystectomy because a 6 mm polyp in her gallbladder.  Patient does have a history of alcohol use which might be the cause of her pancreatitis.  Will follow with you we will see what GI suggests.  Signed By: Philis Piqueavid L Jalin Erpelding, MD, FACS    August 29, 2018

## 2018-08-29 NOTE — Progress Notes (Signed)
NUTRITION RECOMMENDATIONS:   ? Advance diet as tolerated to low fat when medically feasible.  ? Order Ensure Clear (each provides 240 kcal, 8g protein) TID while on clear liquids and advance to Ensure Compact (each 4 oz bottle provides 220 kcal, 9g protein) once diet advances.  ? Rec add thiamine and MVI/min supplementation, consider adding Vit B12 and Ca/D as well.   ? Daily wts to trend.    NUTRITION INITIAL EVALUATION    NUTRITION ASSESSMENT:       Reason for assessment: MST    Admitting diagnosis:   Acute pancreatitis [K85.90]     PMH:   Past Medical History:   Diagnosis Date   ??? Ill-defined condition     bronchitis   ??? Pneumonia        Current pertinent medications: heparin, ferrous sulfate, protonix, IVF @ 150 ml/hr    Pertinent labs: 6/17: AST 49, ALP 154    Anthropometrics & Physical Assessment:   Height:   Ht Readings from Last 3 Encounters:   08/28/18 5' (1.524 m)   04/03/18 5' (1.524 m)   04/06/18 5\' 1"  (1.549 m)      Weight:   Wt Readings from Last 3 Encounters:   08/28/18 84.8 kg (187 lb)   04/03/18 88.9 kg (196 lb)   04/06/18 80.9 kg (178 lb 5.6 oz)         BMI: Body mass index is 36.52 kg/m??.     IBW: Ideal body weight: 45.5 kg (100 lb 4.9 oz)           UBW: 192 lb per pt, states clothes have been fitting differently    Wt change: -5 lb (2.6%) x 5 months         []  significant       [x]  not significant         []  intended         [x]  not intended    GI symptoms/issues:       Pt admitted w/ pancreatitis. 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 Temporal: none  ? Clavicle: none    Fat Wasting:  ? Suborbital: none             Fluid accumulation:    none       Diet and intake history:   ?? Current diet order:   ?? DIET NPO    ?? Food allergies: none     ?? Diet/intake history: Pt reports appetite had improved a lot since January admission, but has dropped off again lately. States eating one meal per day d/t work and schedule. Would sometimes drink a protein shake. States being lactose intolerant (milk), okay with cheese.  Takes a MVI/min and FA supplement at home.            []  <50% intake x >5 days         [x]  <50% intake x >1 month         []  <75% intake x >7 days         []  <75% intake x 1 month         []  <75% intake  x 3 months    ?? Current appetite/PO intake:   No data recorded   Monitor intake per EMR documentation once information becomes available     Assessment of Current MNT:   Currently NPO which is inadequate to meet needs. Advance diet as tolerated to low fat when medically able.     Estimated daily nutrition intake needs:  ?? 1700 - 2125 kcals (20-25 kcal/kg)  ?? 85 - 102 g protein (1-1.2 g/kg)  ?? 2125 mL fluid (25 ml/kg)    NUTRITION DIAGNOSIS:     1. Inadequate energy intake related to decreased ability to consume sufficient energy 2/2 prior hospitalization and pancreatitis as evidenced by pt report <50% intake >1 month, and current NPO status.      NUTRITION INTERVENTION / RECOMMENDATIONS:     ? Advance diet as tolerated to low fat when medically feasible.  ? Order Ensure Clear (each provides 240 kcal, 8g protein) TID while on clear liquids and advance to Ensure Compact (each 4 oz bottle provides 220 kcal, 9g protein) once diet advances.  ? Rec add thiamine and MVI/min supplementation, consider adding Vit B12 and Ca/D as well.   ? Daily wts to trend.    NUTRITION EDUCATION     Education history:   []  No prior education  []  Inpatient education  []  Outpatient education  []  Other:   [x]  Unknown    Diet history: see above    Diet instructed on: pancreatitis    Person(s) educated:  [x]  patient  []  family/significant other  []  guardian/caregiver  []  other:    Education modifications:   []  cultural/religious preferences  []  visual (font size)      []  language     []  age appropriate  []  other:    Barriers to learning/intervention:   []  emotional     []  desire/motivation     []  readiness to learn   []  financial  []  physical and/or cognitive barriers     []  other:    Method of Instruction:   [x]  verbal      [x]  written         []  teachback       []  interpreter:          []  other:     Learner comprehension:    []  unsure  []  poor     []  fair    [x]  good    Expected compliance:   []  unsure  []  poor      []  fair        [x]  good       NUTRITION MONITORING AND EVALUATION:     Nutrition level of care:  []  low       [x]  moderate      []  high    Nutrition monitoring: PO intake, diet tolerance and compliance, wt trend, nutrition-related labs, hydration, s/sx of new skin concerns, medical changes; will follow up per policy.    Nutrition goals:  Pt to meet/tolerate >75% of estimated needs, maintain weight throughout LOS, nutrition-related labs WNL, maintain skin integrity.    Nilsa NuttingMegan Dockweiler, MS, RD  08/29/18  Pager # [phone]

## 2018-08-29 NOTE — Progress Notes (Signed)
Ashley [name] in 4226: c/o nausea, indigestion. Zofran is q6, do you want to increase it? Also, can she have Tums or Maalox? Thanks, Sheryl, RN [id number]</SPAN>

## 2018-08-30 LAB — CBC WITH AUTO DIFFERENTIAL
Basophils %: 0.2 % (ref 0–3)
Eosinophils %: 2.5 % (ref 0–5)
Hematocrit: 37.1 % (ref 37.0–50.0)
Hemoglobin: 11.7 gm/dl — ABNORMAL LOW (ref 13.0–17.2)
Immature Granulocytes: 0.2 % (ref 0.0–3.0)
Lymphocytes %: 43.1 % (ref 28–48)
MCH: 32.5 pg (ref 25.4–34.6)
MCHC: 31.5 gm/dl (ref 30.0–36.0)
MCV: 103.1 fL — ABNORMAL HIGH (ref 80.0–98.0)
MPV: 11.6 fL — ABNORMAL HIGH (ref 6.0–10.0)
Monocytes %: 8.1 % (ref 1–13)
Neutrophils %: 45.9 % (ref 34–64)
Nucleated RBCs: 0 (ref 0–0)
Platelets: 203 10*3/uL (ref 140–450)
RBC: 3.6 M/uL (ref 3.60–5.20)
RDW-SD: 55 — ABNORMAL HIGH (ref 36.4–46.3)
WBC: 4.3 10*3/uL (ref 4.0–11.0)

## 2018-08-30 LAB — COMPREHENSIVE METABOLIC PANEL
ALT: 35 U/L (ref 12–78)
AST: 42 U/L — ABNORMAL HIGH (ref 15–37)
Albumin: 2.3 gm/dl — ABNORMAL LOW (ref 3.4–5.0)
Alkaline Phosphatase: 128 U/L — ABNORMAL HIGH (ref 45–117)
Anion Gap: 5 mmol/L (ref 5–15)
BUN: 3 mg/dl — ABNORMAL LOW (ref 7–25)
CO2: 23 mEq/L (ref 21–32)
Calcium: 7.8 mg/dl — ABNORMAL LOW (ref 8.5–10.1)
Chloride: 110 mEq/L — ABNORMAL HIGH (ref 98–107)
Creatinine: 0.7 mg/dl (ref 0.6–1.3)
EGFR IF NonAfrican American: >60
GFR African American: >60
Glucose: 87 mg/dl (ref 74–106)
Potassium: 3.6 mEq/L (ref 3.5–5.1)
Sodium: 138 mEq/L (ref 136–145)
Total Bilirubin: 0.6 mg/dl (ref 0.2–1.0)
Total Protein: 5.7 gm/dl — ABNORMAL LOW (ref 6.4–8.2)

## 2018-08-30 LAB — CULTURE, URINE: Isolate: >100000 — AB

## 2018-08-30 LAB — BILIRUBIN, DIRECT
Bilirubin, Direct: 0.1 mg/dl (ref 0.0–0.2)
Bilirubin, direct: 0.1 mg/dl (ref 0.0–0.2)

## 2018-08-30 LAB — LIPASE
Lipase: 80 U/L (ref 73–393)
Lipase: 80 U/L (ref 73–393)

## 2018-08-30 LAB — CBC WITH AUTOMATED DIFF
BASOPHILS: 0.2 % (ref 0–3)
EOSINOPHILS: 2.5 % (ref 0–5)
HCT: 37.1 % (ref 37.0–50.0)
HGB: 11.7 gm/dl — ABNORMAL LOW (ref 13.0–17.2)
IMMATURE GRANULOCYTES: 0.2 % (ref 0.0–3.0)
LYMPHOCYTES: 43.1 % (ref 28–48)
MCH: 32.5 pg (ref 25.4–34.6)
MCHC: 31.5 gm/dl (ref 30.0–36.0)
MCV: 103.1 fL — ABNORMAL HIGH (ref 80.0–98.0)
MONOCYTES: 8.1 % (ref 1–13)
MPV: 11.6 fL — ABNORMAL HIGH (ref 6.0–10.0)
NEUTROPHILS: 45.9 % (ref 34–64)
NRBC: 0 (ref 0–0)
PLATELET: 203 10*3/uL (ref 140–450)
RBC: 3.6 M/uL (ref 3.60–5.20)
RDW-SD: 55 — ABNORMAL HIGH (ref 36.4–46.3)
WBC: 4.3 10*3/uL (ref 4.0–11.0)

## 2018-08-30 LAB — METABOLIC PANEL, COMPREHENSIVE
ALT (SGPT): 35 U/L (ref 12–78)
AST (SGOT): 42 U/L — ABNORMAL HIGH (ref 15–37)
Albumin: 2.3 gm/dl — ABNORMAL LOW (ref 3.4–5.0)
Alk. phosphatase: 128 U/L — ABNORMAL HIGH (ref 45–117)
Anion gap: 5 mmol/L (ref 5–15)
BUN: 3 mg/dl — ABNORMAL LOW (ref 7–25)
Bilirubin, total: 0.6 mg/dl (ref 0.2–1.0)
CO2: 23 mEq/L (ref 21–32)
Calcium: 7.8 mg/dl — ABNORMAL LOW (ref 8.5–10.1)
Chloride: 110 mEq/L — ABNORMAL HIGH (ref 98–107)
Creatinine: 0.7 mg/dl (ref 0.6–1.3)
GFR est AA: >60
GFR est non-AA: >60
Glucose: 87 mg/dl (ref 74–106)
Potassium: 3.6 mEq/L (ref 3.5–5.1)
Protein, total: 5.7 gm/dl — ABNORMAL LOW (ref 6.4–8.2)
Sodium: 138 mEq/L (ref 136–145)

## 2018-08-30 MED FILL — MORPHINE 2 MG/ML INJECTION: 2 mg/mL | INTRAMUSCULAR | Qty: 1

## 2018-08-30 MED FILL — ONDANSETRON (PF) 4 MG/2 ML INJECTION: 4 mg/2 mL | INTRAMUSCULAR | Qty: 2

## 2018-08-30 MED FILL — VITAMINS AND MINERALS TABLET: ORAL | Qty: 1

## 2018-08-30 MED FILL — HYDROCODONE-ACETAMINOPHEN 5 MG-325 MG TAB: 5-325 mg | ORAL | Qty: 1

## 2018-08-30 MED FILL — BD POSIFLUSH NORMAL SALINE 0.9 % INJECTION SYRINGE: INTRAMUSCULAR | Qty: 10

## 2018-08-30 MED FILL — VITAMIN B-1 100 MG TABLET: 100 mg | ORAL | Qty: 1

## 2018-08-30 MED FILL — PANTOPRAZOLE 40 MG IV SOLR: 40 mg | INTRAVENOUS | Qty: 40

## 2018-08-30 MED FILL — FOLIC ACID 1 MG TAB: 1 mg | ORAL | Qty: 1

## 2018-08-30 MED FILL — HEPARIN (PORCINE) 5,000 UNIT/ML IJ SOLN: 5000 unit/mL | INTRAMUSCULAR | Qty: 1

## 2018-08-30 MED FILL — CEFTRIAXONE 1 GRAM SOLUTION FOR INJECTION: 1 gram | INTRAMUSCULAR | Qty: 1

## 2018-08-30 NOTE — Progress Notes (Signed)
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  Normal gallbladder.  Inflammation within the duodenal sweep.  Postop changes about the stomach.  Pancreatic head with use in a process in the setting with adjacent inflammation.  Pancreatic  body and tail normal.  No pancreatic ductal dilatation.  Previous pancreatic head pseudocyst has resolved.  Spleen unremarkable.  Peritoneum/mesentery/bowel unremarkable.  Retroperitoneal lymph nodes unremarkable.               -[place] 08/28/2018 reviewed with Radiologist.  No evidence of fluid collection of pseudocyst identified on subsequent review of imaging with comparison. Small gallbladder polyp noted and fatty infiltration of the liver. CBD 4 mm.  Intra-and extrahepatic ducts normal. No definite gallstones.   --Hx of possible sealed off duodenal ulcer perforation with possible abscess    --Elevated AST and alk phos suspect secondary to EtOH use. 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    --Diarrhea-Resolved. rule out infectious source, pancreatic insufficiency or other etiology   --Roux-en-Y Gastric Bypass   --Family hx??of Ulcerative??Colitis-Mother   --Family hx  Colon??Cancer-Maternal??Grandfather      PLAN:      --Monitor LFTs, CBC, BMP, periodic lipase   --Continue Protonix 38m BID    --Acute hepatitis panel results pending    --Stool studies to include stool for C. Diff and pancreatic elastase-NOT obtained d/t No BMs    --Full liquids for now and advance to low fat low cholesterol diet as tolerated    --Monitor abdominal exam, Judicious pain management   --Continue thiamine, folic acid and MVI   --Once discharged, pt will need to follow up as an outpt with Dr. TDelma Freezein 3-4weeks       SUBJECTIVE:   Pt reports some improvement in her abdominal pain. She denies SOB, chest pain, nausea or vomiting. Tolerating liquids       OBJECTIVE:   Patient Vitals for the past 24 hrs:            Temp  Pulse  Resp  BP  SpO2            08/30/18 1118  98.2 ??F (  36.8 ??C)  63  18  97/58  98 %            08/30/18 0819  98.1 ??F (36.7 ??C)  (!) 59  16  115/65  100 %     08/30/18 0419  97.8 ??F (36.6 ??C)  75  16  105/80  100 %     08/29/18 2325  97.8 ??F (36.6 ??C)  82  16  105/64  99 %     08/29/18 2004  98.1 ??F (36.7 ??C)  77  16  103/61  100 %            08/29/18 1545  98.4 ??F (36.9 ??C)  67  16  131/70  100 %        Physical Exam:   GENERAL: alert, pleasant, cooperative, no distress, appears stated age   EYE: No scleral icterus noted    LUNG: clear to auscultation bilaterally   HEART: S1, S2    ABDOMEN: soft, mildly tender  more prominent to epigastric area, bowel sounds active          Labs:  Results:        Chemistry  Recent Labs         08/30/18   0508  08/29/18   0235  08/28/18   1328      GLU  87  92  71*      NA  138  136  136      K  3.6  3.6  3.7      CL  110*  108*  106      CO2  23  24  23       BUN  3*  7  7      CREA  0.7  0.8  0.8      CA  7.8*  7.9*  8.3*      AGAP  5  5  7       AP  128*  154*  164*      TP  5.7*  6.3*  7.3      ALB  2.3*  2.6*  3.0*          Estimated Creatinine Clearance: 109.4 mL/min (based on SCr of 0.7 mg/dL).     CBC w/Diff  Recent Labs         08/30/18   0846   08/29/18   0235  08/28/18   2139  08/28/18   1328      WBC  4.3  7.5   --   8.3      RBC  3.60  3.56*   --   3.87      HGB  11.7*  11.6*  11.9*  12.8*      HCT  37.1  35.8*  36.0*  38.0      PLT  203  206   --   255      GRANS  45.9  71.0*   --   73.8*      LYMPH  43.1  21.6*   --   19.1*      EOS  2.5  1.1   --   0.2              Cardiac Enzymes  No results for input(s): CPK, CKND1, MYO in the last 72 hours.      No lab exists for component: CKRMB, TROIP     Coagulation  No results  for input(s): PTP, INR, APTT, INREXT in the last 72 hours.         Hepatitis Panel  Lab Results      Component  Value  Date/Time        Hep B surface Ag screen  Negative  12/20/2010 12:00 AM            Recent Labs         08/30/18   0508  08/29/18   0235  08/28/18   1328      ALT  35  47  61      TBILI  0.6  1.0  0.5      AP  128*  154*  164*      ALB  2.3*  2.6*  3.0*      TP  5.7*  6.3*  7.3                       Amylase Lipase          Liver Enzymes  Recent Labs         08/30/18   0508      TP  5.7*      ALB  2.3*      AP  128*      ALT  35                 Thyroid Studies  No results for input(s): T4, T3U, TSH, TSHEXT in the last 72 hours.      No lab exists for component: T3RU       Pathology  pathology        Diagnostic Imaging:   No results found.        Allergies        Allergen  Reactions         ?  Aspirin  Other (comments)             Stomach ulcer         ?  Ibuprofen  Unknown (comments)             Gastric Bypass--advised to avoid         ?  Nsaids (Non-Steroidal Anti-Inflammatory Drug)  Other (comments)             Hx gastric bypass          Current Facility-Administered Medications          Medication  Dose  Route  Frequency           ?  ondansetron (ZOFRAN) injection 4 mg   4 mg  IntraVENous  Q6H PRN     ?  cefTRIAXone (ROCEPHIN) 1 g in 0.9% sodium chloride (MBP/ADV) 50 mL MBP   1 g  IntraVENous  Q24H     ?  dextrose 5% and 0.9% NaCl infusion   100 mL/hr  IntraVENous  CONTINUOUS     ?  morphine injection 2 mg   2 mg   IntraVENous  Q4H PRN     ?  promethazine (PHENERGAN) 12.5 mg in NS 50 mL IVPB   12.5 mg  IntraVENous  Q6H PRN           ?  alum-mag hydroxide-simeth (MYLANTA) oral suspension 30 mL   30 mL  Oral  Q4H PRN           ?  pantoprazole (PROTONIX) 40 mg in 0.9% sodium chloride 10 mL injection  40 mg  IntraVENous  Q12H     ?  thiamine HCL (B-1) tablet 100 mg   100 mg  Oral  DAILY     ?  folic acid (FOLVITE) tablet 1 mg   1 mg  Oral  DAILY     ?  therapeutic multivitamin-minerals (THERAGRAN-M) tablet 1 Tab   1 Tab  Oral  DAILY     ?  sodium chloride (NS) flush 5-10 mL   5-10 mL  IntraVENous  Q8H     ?  sodium chloride (NS) flush 5-10 mL   5-10 mL  IntraVENous  PRN     ?  naloxone (NARCAN) injection 0.1 mg   0.1 mg  IntraVENous  PRN     ?  acetaminophen (TYLENOL) tablet 650 mg   650 mg  Oral  Q6H PRN     ?  HYDROcodone-acetaminophen (NORCO) 5-325 mg per tablet 1 Tab   1 Tab  Oral  Q4H PRN     ?  albuterol (PROVENTIL VENTOLIN) nebulizer solution 2.5 mg   2.5 mg  Nebulization  Q6H PRN           ?  heparin (porcine) injection 5,000 Units   5,000 Units  SubCUTAneous  Q8H        Active Problems:     Acute pancreatitis (08/28/2018)         Chantel Dixon MS, APRN, FNP-C   Office: [phone]   August 30, 2018      I was personally available for consultation. I have reviewed patient's record and  agree with the documentation recorded by the Physician Asst/Nurse Practitioner, including the assessment, treatment plan, and disposition.      Sloan Leiter, MD   August 30, 2018

## 2018-08-30 NOTE — Progress Notes (Signed)
 Patient admitted on 08/28/2018 from home with Pancreatitis    Chief Complaint   Patient presents with   . Abdominal Pain   . Shortness of Breath          Tentative dc plan: Home with family assistance       Treatment Team: Treatment Team: Attending Provider: Murl Fontaine RAMAN, MD; Consulting Provider: Lonnie Passy, MD; Consulting Provider: Murl Fontaine RAMAN, MD; Care Manager: Tilmon Pao; Consulting Provider: Franchot Alm CROME, MD; Consulting Provider: Melvenia Pencil D, NP      The patient has been admitted to the hospital 1 times in the past 12 months.    Previous 4 Admission Dates Admission and Discharge Diagnosis Interventions Barriers Disposition   04/03/18-04/12/18 Abdominal Abscess                             Caregivers Participating in Plan of Care/Discharge Plan with the patient: None      Anticipated DME needs for discharge: None      Does the patient have appropriate clothing available to be worn at discharge? Yes      The patient and care participants are willing to travel N/A  area for discharge facility.  The patient and plan of care participants have been provided with a list of all available Rehab Facilities or Home Health agencies as applicable. CM will follow up with a list of facilities or agencies that are offer acceptance.    CM has disclosed any financial interest that [facility] may have with any facility or agency.    Anticipated Discharge Date: 2-3 days     The plan of care and discharge plan has been discussed with Sim[place], Fontaine RAMAN, MD and all other appropriate providers and adjusted per interdisciplinary team recommendations and in discussion with the patient and the patient designated Care Plan Participants.    Barriers to Healthcare Success/ Readmission Risk Factors: None    Consults:  Palliative Care Consult Recommended: No  Transitional Care Clinic Referral: No  Transitional Nurse Navigator Referral: No  Oncology Navigator Referral: No  SW consulted: No  [facility] Consulted: No  Outside Hospital/Community Resources Referrals and Collaboration: No    Food/Nutrition Needs:   No      Dietician Consulted: No    RRAT Score: Low Risk            8       Total Score        4 IP Visits Last 12 Months (1-3=4, 4=9, >4=11)    4 Pt. Coverage (Medicare=5 , Medicaid, or Self-Pay=4)        Criteria that do not apply:    Has Seen PCP in Last 6 Months (Yes=3, No=0)    Married. Living with Significant Other. Assisted Living. LTAC. SNF. or   Rehab    Patient Length of Stay (>5 days = 3)    Charlson Comorbidity Score (Age + Comorbid Conditions)           PCP: Laurita, Da, MD .   How do you get to your doctor appointments? Sister drives     Specialists:     [facility]   Are there any medications that you have trouble paying for? No  Any difficulty getting your medications? No    DME available at Home: None      Home O2 Provider: No  Home O2 L Flow:  No  Dialysis Unit N/A     Home Environment and Prior Level of Function: Lives at [address]  [place] [address] @HOMEPHONE @.     Lives with boyfriend in a 2 story home with 3 steps    Responsibilities at home include ADLS       Prior to admission open services: No    Home Health Agency-  Personal Care Agency-    Extended Emergency Contact Information  Primary Emergency Contact: Harrell,Derrick   [place]  Home Phone: [phone]  Mobile Phone: [phone]  Relation: Boyfriend  Secondary Emergency Contact: SIGNA RAISIN  Mobile Phone: [phone]  Relation: Friend     Transportation: Sister  will transport home    Therapy Recommendations:    OT = No    PT = No    SLP =  No     RT Home O2 Evaluation =  No    Wound Care =  No    Case Management Assessment    ABUSE/NEGLECT SCREENING   Physical Abuse/Neglect: Denies   Sexual Abuse: Denies   Sexual Abuse: Denies   Other Abuse/Issues: Denies          PRIMARY DECISION MAKER    SELF                                CARE MANAGEMENT INTERVENTIONS   Readmission Interview  Completed: Not Applicable   PCP Verified by CM: Yes           Mode of Transport at Discharge: BLS       Transition of Care Consult (CM Consult): Discharge Planning           MyChart Signup: No   Discharge Durable Medical Equipment: No   Physical Therapy Consult: No   Occupational Therapy Consult: No   Speech Therapy Consult: No   Current Support Network: Other(Lives with boyfriend )   Reason for Referral: DCP Rounds   History Provided By: Patient, Medical Record   Patient Orientation: Alert and Oriented, Person, Place, Situation, Self   Cognition: Alert   Support System Response: Cooperative   Previous Living Arrangement: Lives with Family Independent   Home Accessibility: Steps(3 steps )   Prior Functional Level: Independent in ADLs/IADLs   Current Functional Level: Independent in ADLs/IADLs       Can patient return to prior living arrangement: Yes   Ability to make needs known:: Good   Family able to assist with home care needs:: Yes   Pets: None           Types of Needs Identified: Disease Management Education, Treatment Education       Confirm Follow Up Transport: Family   Confirm Transport and Arrange: Yes              DISCHARGE LOCATION   Discharge Placement: Home with family assistance

## 2018-08-30 NOTE — Progress Notes (Signed)
[facility] Progress Note    Patient: Ashley [name] MRN: [id number]  SSN: [id number]    Date of Birth: Dec 29, 1985  Age: 33 y.o.  Sex: female      Admit Date: 08/28/2018    LOS: 2 days     Subjective:     Still with some nausea but feeling better from abdominal pain    Objective:     Vitals:    08/29/18 2004 08/29/18 2325 08/30/18 0419 08/30/18 0819   BP: 103/61 105/64 105/80 115/65   Pulse: 77 82 75 (!) 59   Resp: 16 16 16 16    Temp: 98.1 ??F (36.7 ??C) 97.8 ??F (36.6 ??C) 97.8 ??F (36.6 ??C) 98.1 ??F (36.7 ??C)   SpO2: 100% 99% 100% 100%   Weight:       Height:            Intake and Output:  Current Shift: 06/18 0701 - 06/18 1900  In: -   Out: 800 [Urine:800]  Last three shifts: 06/16 1901 - 06/18 0700  In: 5638 [P.O.:400; I.V.:2930]  Out: -     Physical Exam:   ABDOMEN: soft, some epigastric tenderness    Lab/Data Review:  No results found.        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  Patient would probably benefit from having her gallbladder out electively because of a 6 mm polyp but no immediate cholecystectomy is necessary.  Elmer Bales, MD  August 30, 2018

## 2018-08-30 NOTE — Progress Notes (Signed)
Problem: Falls - Risk of  Goal: *Absence of Falls  Description: Document Bridgette Habermann Fall Risk and appropriate interventions in the flowsheet.  Outcome: Progressing Towards Goal  Note: Fall Risk Interventions:            Medication Interventions: Patient to call before getting OOB, Teach patient to arise slowly    Elimination Interventions: Call light in reach, Stay With Me (per policy)              Problem: Pain  Goal: *Control of Pain  Outcome: Progressing Towards Goal     Problem: Pancreatitis  Goal: *Control of acute pain  Outcome: Progressing Towards Goal     Problem: Pancreatitis  Goal: *Absence of nausea/vomiting  Outcome: Progressing Towards Goal

## 2018-08-30 NOTE — Progress Notes (Signed)
Problem: Pain  Goal: *Control of Pain  Outcome: Progressing Towards Goal  Goal: *PALLIATIVE CARE:  Alleviation of Pain  Outcome: Progressing Towards Goal     Problem: Falls - Risk of  Goal: *Absence of Falls  Description: Document Schmid Fall Risk and appropriate interventions in the flowsheet.  Outcome: Progressing Towards Goal  Note: Fall Risk Interventions:            Medication Interventions: Bed/chair exit alarm, Patient to call before getting OOB, Teach patient to arise slowly    Elimination Interventions: Call light in reach, Patient to call for help with toileting needs, Toileting schedule/hourly rounds              Problem: Patient Education: Go to Patient Education Activity  Goal: Patient/Family Education  Outcome: Progressing Towards Goal     Problem: Pancreatitis  Goal: *Control of acute pain  Outcome: Progressing Towards Goal  Goal: *Absence of nausea/vomiting  Outcome: Progressing Towards Goal  Goal: *Optimize nutritional status  Outcome: Progressing Towards Goal  Goal: *Labs within defined limits  Outcome: Progressing Towards Goal     Problem: Risk for Spread of Infection  Goal: Prevent transmission of infectious organism to others  Description: Prevent the transmission of infectious organisms to other patients, staff members, and visitors.  Outcome: Progressing Towards Goal     Problem: Patient Education:  Go to Education Activity  Goal: Patient/Family Education  Outcome: Progressing Towards Goal

## 2018-08-30 NOTE — Progress Notes (Signed)
Progress Notes by Everlene Farrier, MD at 08/30/18 1054                Author: Everlene Farrier, MD  Service: Hospitalist  Author Type: Physician       Filed: 08/30/18 1440  Date of Service: 08/30/18 1054  Status: Signed          Editor: Everlene Farrier, MD (Physician)                          INTERNAL MEDICINE PROGRESS NOTE      Date of note:      August 30, 2018      Patient:               Ashley [name],  33 y.o., female   Admit Date:        08/28/2018   Length of Stay:  2 day(s)      Problem List:      Patient Active Problem List        Diagnosis  Code         ?  Previous cesarean delivery, antepartum condition or complication  E31.540     ?  Bariatric surgery status complicating pregnancy, childbirth, or the puerperium, antepartum condition or complication  G86.761     ?  Intra-abdominal abscess ([place])  K65.1     ?  IV infiltrate  T80.1XXA     ?  Duodenitis  K29.80         ?  Acute pancreatitis  K85.90             Subjective + interval history        Abdominal pain is improving. Was nauseous all day yesterday and this morning. However was able to eat some soup for lunch        Assessment :     ??   1.  Presented with RUQ and epigastric pain - could be from pancreatitis or gastritis   2.  Acute pancreatitis - suspect due to alcohol use   3.  Elevated LFT - could be from alcohol use   4.  Diarrhea for 2 weeks prior to admission, no recent antibiotic use   5.  Hospitalization in Jan 2020 for peripancreatic cyst/ abscess - presumed to be due to ruptured duodenal ulcer. Treated with antibiotics   6.  UTI present on admission - klebsiella   7.  History of Roux en Y gastric bypass   8.  Obesity BMI 36   9.  Daily alcohol use   10.  Left adnexal cyst - likely ovarian cyst - outpatient follow up   ??     Plan :     ??   Lipase is normal now. LFT trending down. Advance diet to soft solids may be later this evening only if patient is able to tolerate full liquid diet   Patient has been strongly counseled to quit  drinking completely      Continue IV Protonix 40 mg twice daily.   ??   CT abdomen shows resolution of pancreatic pseudocyst      RUQ ultrasound shows Gall bladder polyp.  General surgery has been consulted.  Recommend outpatient follow-up to schedule laparoscopic cholecystectomy.??   ??   Urine culture-Klebsiella.  Continue ceftriaxone   ??   Heparin for DVT prophylaxis      Patient says she will update her family  Hopefully home tomorrow   ??   ??   - Code status: full code   ??   Recommend to continue hospitalization.   Expected date of discharge:  Tomorrow   Plan for disposition - tbd           Objective:           Visit Vitals      BP  115/65 (BP 1 Location: Left arm, BP Patient Position: Supine)     Pulse  (!) 59     Temp  98.1 ??F (36.7 ??C)     Resp  16     Ht  5' (1.524 m)     Wt  84.8 kg (187 lb)     SpO2  100%        BMI  36.52 kg/m??                    Intake/Output Summary (Last 24 hours) at 08/30/2018 1054   Last data filed at 08/30/2018 0910     Gross per 24 hour        Intake  3330 ml        Output  800 ml        Net  2530 ml             Physical Exam:        GEN - AAOx3, NAD   HEENT -  mucous membranes moist   Neck - supple, no JVD   Cardiac - RRR, S1, S2, no murmurs   Chest/Lungs - clear to auscultation without wheezes or rhonchi   Abdomen - soft, tenderness in suprapubic region, epigastric region and RUQ -much improved. No guarding   Extremities - no clubbing/ cyanosis/ edema   Neuro - CN 2-12 intact. No focal deficits. No motor or sensory deficit appreciated.    Skin - no rashes or lesions   ??        Current medications:           Current Facility-Administered Medications:    ?  ondansetron (ZOFRAN) injection 4 mg, 4 mg, IntraVENous, Q6H PRN, Knox Saliva, MD, 4 mg at 08/30/18 0902   ?  cefTRIAXone (ROCEPHIN) 1 g in 0.9% sodium chloride (MBP/ADV) 50 mL MBP, 1 g, IntraVENous, Q24H, Wealthy Danielski S, MD, Last Rate: 100 mL/hr at 08/29/18 1045, 1 g at 08/29/18 1045   ?  dextrose 5% and 0.9% NaCl  infusion, 100 mL/hr, IntraVENous, CONTINUOUS, Jurney Overacker S, MD, Last Rate: 100 mL/hr at 08/30/18 0521, 100 mL/hr at 08/30/18 0521   ?  morphine injection 2 mg, 2 mg, IntraVENous, Q4H PRN, Romelia Bromell S, MD, 2 mg at 08/30/18 0643   ?  promethazine (PHENERGAN) 12.5 mg in NS 50 mL IVPB, 12.5 mg, IntraVENous, Q6H PRN, Keisha Amer S, MD   ?  alum-mag hydroxide-simeth (MYLANTA) oral suspension 30 mL, 30 mL, Oral, Q4H PRN, Levan Aloia S, MD   ?  pantoprazole (PROTONIX) 40 mg in 0.9% sodium chloride 10 mL injection, 40 mg, IntraVENous, Q12H, Dixon, Chantel D, NP, 40 mg at 08/30/18 0901   ?  thiamine HCL (B-1) tablet 100 mg, 100 mg, Oral, DAILY, Dixon, Chantel D, NP, 100 mg at 08/30/18 0900   ?  folic acid (FOLVITE) tablet 1 mg, 1 mg, Oral, DAILY, Dixon, Chantel D, NP, 1 mg at 08/30/18 0901   ?  therapeutic multivitamin-minerals (THERAGRAN-M) tablet 1 Tab, 1 Tab, Oral, DAILY, Dixon, Chantel D, NP,  1 Tab at 08/30/18 0901   ?  sodium chloride (NS) flush 5-10 mL, 5-10 mL, IntraVENous, Q8H, Charlaine Dalton, MD, 10 mL at 08/30/18 0521   ?  sodium chloride (NS) flush 5-10 mL, 5-10 mL, IntraVENous, PRN, Charlaine Dalton, MD, 10 mL at 08/28/18 2137   ?  naloxone [facility]) injection 0.1 mg, 0.1 mg, IntraVENous, PRN, Charlaine Dalton, MD   ?  acetaminophen (TYLENOL) tablet 650 mg, 650 mg, Oral, Q6H PRN, Charlaine Dalton, MD   ?  HYDROcodone-acetaminophen (NORCO) 5-325 mg per tablet 1 Tab, 1 Tab, Oral, Q4H PRN, Charlaine Dalton, MD, 1 Tab at 08/30/18 0901   ?  albuterol (PROVENTIL VENTOLIN) nebulizer solution 2.5 mg, 2.5 mg, Nebulization, Q6H PRN, Charlaine Dalton, MD   ?  heparin (porcine) injection 5,000 Units, 5,000 Units, SubCUTAneous, Q8H, Charlaine Dalton, MD, 5,000 Units at 08/30/18 4010        Labs:          Recent Results (from the past 24 hour(s))     LIPASE          Collection Time: 08/30/18  5:08 AM         Result  Value  Ref Range            Lipase  80  73 - 393 U/L       METABOLIC PANEL, COMPREHENSIVE           Collection Time: 08/30/18  5:08 AM         Result  Value  Ref Range            Sodium  138  136 - 145 mEq/L            Potassium  3.6  3.5 - 5.1 mEq/L       Chloride  110 (H)  98 - 107 mEq/L       CO2  23  21 - 32 mEq/L       Glucose  87  74 - 106 mg/dl       BUN  3 (L)  7 - 25 mg/dl       Creatinine  0.7  0.6 - 1.3 mg/dl       GFR est AA  >60.0          GFR est non-AA  >60          Calcium  7.8 (L)  8.5 - 10.1 mg/dl       AST (SGOT)  42 (H)  15 - 37 U/L       ALT (SGPT)  35  12 - 78 U/L       Alk. phosphatase  128 (H)  45 - 117 U/L       Bilirubin, total  0.6  0.2 - 1.0 mg/dl       Protein, total  5.7 (L)  6.4 - 8.2 gm/dl       Albumin  2.3 (L)  3.4 - 5.0 gm/dl       Anion gap  5  5 - 15 mmol/L       BILIRUBIN, DIRECT          Collection Time: 08/30/18  5:08 AM         Result  Value  Ref Range            Bilirubin, direct  0.1  0.0 - 0.2 mg/dl       CBC WITH AUTOMATED DIFF  Collection Time: 08/30/18  8:46 AM         Result  Value  Ref Range            WBC  4.3  4.0 - 11.0 1000/mm3       RBC  3.60  3.60 - 5.20 M/uL       HGB  11.7 (L)  13.0 - 17.2 gm/dl       HCT  37.1  37.0 - 50.0 %       MCV  103.1 (H)  80.0 - 98.0 fL       MCH  32.5  25.4 - 34.6 pg       MCHC  31.5  30.0 - 36.0 gm/dl       PLATELET  203  140 - 450 1000/mm3       MPV  11.6 (H)  6.0 - 10.0 fL       RDW-SD  55.0 (H)  36.4 - 46.3         NRBC  0  0 - 0         IMMATURE GRANULOCYTES  0.2  0.0 - 3.0 %       NEUTROPHILS  45.9  34 - 64 %       LYMPHOCYTES  43.1  28 - 48 %       MONOCYTES  8.1  1 - 13 %       EOSINOPHILS  2.5  0 - 5 %            BASOPHILS  0.2  0 - 3 %           XR Results:     Results from Hospital Encounter encounter on 08/28/18     XR CHEST SNGL V           Narrative  Indication: chest pain.    .              Impression  IMPRESSION: Portable AP upright view the chest exposed at 1:44 PM August 28, 2018   reveals the lungs are clear and the heart is of normal size.Marland Kitchen  No change from   March 19, 2017.              CT Results:      Results from Hospital Encounter encounter on 08/28/18     CT ABD PELV W CONT           Narrative  EXAMINATION: CT ABD PELV W CONT      CLINICAL INDICATION: Upper abdominal pain           COMPARISON:  April 25, 2018      TECHNIQUE:  All CT exams at this facility use one or more dose reduction   techniques including automatic exposure control, ma/kV  adjustment per patient's   size, or iterative reconstruction technique. DICOM format image data available   to non-affiliated external healthcare facilities or entities on a secure, media   free, reciprocal searchable basis with patient authorization for 12 months   following the date of the study.      Multiple contiguous axial CT images at 5  mm increments were obtained from the   bases of the lungs to the  pubic symphysis with intravenous contrast.      FINDINGS:      Visualized lungs/chest: Unremarkable. Gallbladder normal. No intrahepatic   biliary duct dilatation.      Hepatobiliary: Unremarkable. Gallbladder normal.  Kidneys/ureters: Unremarkable.      Stomach: Postop changes about the stomach. Inflammation within the duodenal   sweep.      Adrenal glands: Unremarkable.      Pancreas: Pancreatic head and uncinate process indistinct with adjacent   inflammation. Pancreatic body and tail normal. No pancreatic duct dilatation.   Previous pancreatic head pseudocyst has resolved.      Spleen: Unremarkable.      Vascular structures:  Unremarkable.      Peritoneum/mesentery/bowel:  Unremarkable.      Retroperitoneal lymph nodes: Unremarkable      Pelvis:  Left adnexal cystic lesion 36 x 29 mm, new. Appendix normal           Bones: No significant lesions.      Miscellaneous: None.              Impression  IMPRESSION:       1. Pancreatitis   2. Resolution of pancreatic head pseudocyst.   3. Left adnexal cystic lesion, likely ovarian cyst. Recommend follow-up pelvic   ultrasound in 6 weeks to ensure resolution.              MRI Results:     Results from Hospital  Encounter encounter on 04/03/18     MRI ABD W WO CONT           Narrative  MRI ABDOMEN WITH AND WITHOUT CONTRAST      CLINICAL HISTORY: Right upper quadrant pain      COMPARISON: CT dated 04/03/2018      TECHNIQUE: Multiplanar, multisequence imaging of the upper abdomen was performed   before and after administration of intravenous contrast.      FINDINGS:      Subsegmental atelectasis left greater than right lung base.      2.7 cm focal lesion within the left hepatic lobe, adjacent to the falciform   ligament is consistent with focal hepatic steatosis. This region manifested as   signal dropout on the out of phase sequence. Liver, otherwise grossly   unremarkable. Gallbladder, spleen, adrenal glands and kidneys grossly   unremarkable. Gastric bypass surgery. Inflammatory change adjacent to the   pancreatic head and uncinate process. There is also thickening of the distal   second and proximal third portions of the duodenum. Adjacent to the junction of   the second and third portions of the duodenum, there is a peripherally enhancing   8.5 x 4.6 x 2.8 cm (ML by CC by AP) fluid collection. The fluid collection is   intimate with the inferior margin of the duodenum within this region, and   extends to the right anterior perirenal fascia. This is concerning for a   developing abscess. This may be related to a contained, ruptured ulcer. Other   differential considerations may include pancreatic pseudocyst with possible   superimposed inflammation of the pancreatic head/uncinate process.              Impression  IMPRESSION:   1. Lesion within the liver visualized on previous CT is most consistent with   focal fatty infiltration. No aggressive process.   2. Prominent peripherally enhancing fluid collection adjacent to the pancreatic   head/uncinate process with extensive surrounding inflammatory change as   described. This is concerning for a developing abscess. This may be related to a   contained, ruptured ulcer. Other  differential considerations may include   pancreatic pseudocyst with possible superimposed inflammation of the pancreatic   head/uncinate process.   3.  Subsegmental atelectasis within the lung bases.              Nuclear Medicine Results:   No results found for this or any previous visit.      [place] Results:     Results from [place] encounter on 08/28/18     [place] RUQ           Narrative  History: RUQ pain.                  Impression  Impression: Fatty liver. Small gallbladder polyp. Stable peripancreatic head 3   cm fluid collection.      Comment: Sonography of the right upper quadrant was performed. This was compared   with CT abdomen and pelvis April 25, 2018 and right upper quadrant ultrasound   April 08, 2018.      The liver is of mild increased echotexture from fatty infiltration. The intra   and extrahepatic ducts are of normal caliber. The common bile duct measures 4   millimeters. A 6 mm polyp is noted adherent to the gallbladder wall. Otherwise   the wall is of normal thickness. Sonographic Percell Miller sign is negative. No   definite gallstones. No pericholecystic fluid. A 3.1 x 2.1 cm fluid collection   is again noted anterior to the head of the pancreas unchanged from February   2020. tThe right kidney is unremarkable..              IR Results:   No results found for this or any previous visit.      VAS/US Results:     Results from Hospital Encounter encounter on 01/20/18     DUPLEX LOWER EXT VENOUS RIGHT           Narrative                                                                Study ID:    [id number]                                                      [facility]                                             [address]  [place],                                            [place]                                        Lower Extremity Venous Report      Name: AMARISS, DETAMORE Date: 01/20/2018 08:00 PM   MRN: [id number]                      Patient Location: [id number]   DOB: 17-Mar-1985                  Age: 27 yrs   Gender: Female                   Account #: [id number]   Reason For Study: Right leg pain   Ordering Physician: Johnella Moloney      Performed By: Lowella Dell, RVS      Interpretation Summary   No evidence of superficial or deep vein thrombosis noted in the right lower   extremity.   No evidence of deep venous thrombosis in the contralateral/left common    femoral   vein.   _____________________________________________________________________________   __         QUALITY/PROCEDURE   Limited unilateral venous duplex performed of the right leg. Quality of the   study is good. M79.604.      HISTORY/SYMPTOMS   Right leg pain.      RIGHT LEG   The common femoral, femoral, popliteal, posterior tibial and peroneal veins   were examined with duplex ultrasound. The deep veins were patent and   compressible with no evidence of intraluminal thrombus. Spontaneous, phasic   venous flow with normal augmentation noted throughout the right leg.      RIGHT SAPHENOUS VEINS   Spectral Doppler exam demonstrates normal venous flow in the right great   saphenous vein. Compression of the right great saphenous vein is complete.   Filling defects in the right great saphenous vein are absent.         LEFT LEG   Spectral Doppler exam demonstrates normal venous flow in the left common   femoral vein. Compression of the left common femoral vein is complete.    Filling   defects in the left common femoral vein are absent.         Electronically signed byDR Wenda Low, MD   01/21/2018 01:10 AM              Total clinical care time was 35  minutes of which more than 50% was spent in coordination of care and  counseling (time spent with patient/family face  to face, physical exam, reviewing laboratory and imaging investigations, speaking with physicians and  nursing staff involved in this patient's care).       Perfecto Kingdom,  MD   [facility]   August 30, 2018   Time: 10:54 AM

## 2018-08-30 NOTE — Other (Signed)
PAGER ID: [phone]   MESSAGE: Hi ,IV in [phone], Yvet[place], Drotar is infiltrated. kindly place a new line.Lija [phone]

## 2018-08-30 NOTE — Other (Signed)
Received report from the Lab stating pt has a critical lab result for WBC.verified with chart regarding pt meds and condition. Agreed with repeat wbc

## 2018-08-30 NOTE — Progress Notes (Signed)
INTERNAL MEDICINE PROGRESS NOTE    Date of note:      August 30, 2018    Patient:               Ashley [name], 33 y.o., female  Admit Date:        08/28/2018  Length of Stay:  2 day(s)    Problem List:   Patient Active Problem List   Diagnosis Code   ??? Previous cesarean delivery, antepartum condition or complication N82.956   ??? Bariatric surgery status complicating pregnancy, childbirth, or the puerperium, antepartum condition or complication O13.086   ??? Intra-abdominal abscess ([place]) K65.1   ??? IV infiltrate T80.1XXA   ??? Duodenitis K29.80   ??? Acute pancreatitis K85.90       Subjective + interval history     Abdominal pain is improving. Was nauseous all day yesterday and this morning. However was able to eat some soup for lunch    Assessment :   ??  1. Presented with RUQ and epigastric pain - could be from pancreatitis or gastritis  2. Acute pancreatitis - suspect due to alcohol use  3. Elevated LFT - could be from alcohol use  4. Diarrhea for 2 weeks prior to admission, no recent antibiotic use  5. Hospitalization in Jan 2020 for peripancreatic cyst/ abscess - presumed to be due to ruptured duodenal ulcer. Treated with antibiotics  6. 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 Obesity BMI 36  9. Daily alcohol use  10. Left adnexal cyst - likely ovarian cyst - outpatient follow up  ??  Plan :   ??  Lipase is normal now. LFT trending down. Advance diet to soft solids may be later this evening only if patient is able to tolerate full liquid diet  Patient has been strongly counseled to quit drinking completely    Continue IV Protonix 40 mg twice daily.  ??  CT abdomen shows resolution of pancreatic pseudocyst    RUQ ultrasound shows Gall bladder polyp.  General surgery has been consulted.  Recommend outpatient follow-up to schedule laparoscopic cholecystectomy.??  ??  Urine culture???Klebsiella.  Continue ceftriaxone  ??  Heparin for DVT prophylaxis     Patient says she will update her family    Hopefully home tomorrow  ??  ??  - Code status: full code  ??  Recommend to continue hospitalization.  Expected date of discharge:  Tomorrow  Plan for disposition - tbd      Objective:       Visit Vitals  BP 115/65 (BP 1 Location: Left arm, BP Patient Position: Supine)   Pulse (!) 59   Temp 98.1 ??F (36.7 ??C)   Resp 16   Ht 5' (1.524 m)   Wt 84.8 kg (187 lb)   SpO2 100%   BMI 36.52 kg/m??             Intake/Output Summary (Last 24 hours) at 08/30/2018 1054  Last data filed at 08/30/2018 0910  Gross per 24 hour   Intake 3330 ml   Output 800 ml   Net 2530 ml       Physical Exam:     GEN - AAOx3, NAD  HEENT -  mucous membranes moist  Neck - supple, no JVD  Cardiac - RRR, S1, S2, no murmurs  Chest/Lungs - clear to auscultation without wheezes or rhonchi  Abdomen - soft, tenderness in suprapubic region, epigastric region and  RUQ -much improved. No guarding  Extremities - no clubbing/ cyanosis/ edema  Neuro - CN 2-12 intact. No focal deficits. No motor or sensory deficit appreciated.   Skin - no rashes or lesions  ??    Current medications:       Current Facility-Administered Medications:   ???  ondansetron (ZOFRAN) injection 4 mg, 4 mg, IntraVENous, Q6H PRN, Knox Saliva, MD, 4 mg at 08/30/18 0902  ???  cefTRIAXone (ROCEPHIN) 1 g in 0.9% sodium chloride (MBP/ADV) 50 mL MBP, 1 g, IntraVENous, Q24H, Lizzett Nobile S, MD, Last Rate: 100 mL/hr at 08/29/18 1045, 1 g at 08/29/18 1045  ???  dextrose 5% and 0.9% NaCl infusion, 100 mL/hr, IntraVENous, CONTINUOUS, Carlinda Ohlson S, MD, Last Rate: 100 mL/hr at 08/30/18 0521, 100 mL/hr at 08/30/18 0521  ???  morphine injection 2 mg, 2 mg, IntraVENous, Q4H PRN, Ebin Palazzi S, MD, 2 mg at 08/30/18 3154  ???  promethazine (PHENERGAN) 12.5 mg in NS 50 mL IVPB, 12.5 mg, IntraVENous, Q6H PRN, Nyazia Canevari S, MD  ???  alum-mag hydroxide-simeth (MYLANTA) oral suspension 30 mL, 30 mL, Oral, Q4H PRN, Dru Laurel S, MD   ???  pantoprazole (PROTONIX) 40 mg in 0.9% sodium chloride 10 mL injection, 40 mg, IntraVENous, Q12H, Dixon, Chantel D, NP, 40 mg at 08/30/18 0901  ???  thiamine HCL (B-1) tablet 100 mg, 100 mg, Oral, DAILY, Dixon, Chantel D, NP, 100 mg at 00/86/76 1950  ???  folic acid (FOLVITE) tablet 1 mg, 1 mg, Oral, DAILY, Dixon, Chantel D, NP, 1 mg at 08/30/18 0901  ???  therapeutic multivitamin-minerals (THERAGRAN-M) tablet 1 Tab, 1 Tab, Oral, DAILY, Dixon, Chantel D, NP, 1 Tab at 08/30/18 0901  ???  sodium chloride (NS) flush 5-10 mL, 5-10 mL, IntraVENous, Q8H, Charlaine Dalton, MD, 10 mL at 08/30/18 0521  ???  sodium chloride (NS) flush 5-10 mL, 5-10 mL, IntraVENous, PRN, Charlaine Dalton, MD, 10 mL at 08/28/18 2137  ???  naloxone (NARCAN) injection 0.1 mg, 0.1 mg, IntraVENous, PRN, Charlaine Dalton, MD  ???  acetaminophen (TYLENOL) tablet 650 mg, 650 mg, Oral, Q6H PRN, Charlaine Dalton, MD  ???  HYDROcodone-acetaminophen (NORCO) 5-325 mg per tablet 1 Tab, 1 Tab, Oral, Q4H PRN, Charlaine Dalton, MD, 1 Tab at 08/30/18 0901  ???  albuterol (PROVENTIL VENTOLIN) nebulizer solution 2.5 mg, 2.5 mg, Nebulization, Q6H PRN, Charlaine Dalton, MD  ???  heparin (porcine) injection 5,000 Units, 5,000 Units, SubCUTAneous, Q8H, Charlaine Dalton, MD, 5,000 Units at 08/30/18 0521    Labs:     Recent Results (from the past 24 hour(s))   LIPASE    Collection Time: 08/30/18  5:08 AM   Result Value Ref Range    Lipase 80 73 - 932 U/L   METABOLIC PANEL, COMPREHENSIVE    Collection Time: 08/30/18  5:08 AM   Result Value Ref Range    Sodium 138 136 - 145 mEq/L    Potassium 3.6 3.5 - 5.1 mEq/L    Chloride 110 (H) 98 - 107 mEq/L    CO2 23 21 - 32 mEq/L    Glucose 87 74 - 106 mg/dl    BUN 3 (L) 7 - 25 mg/dl    Creatinine 0.7 0.6 - 1.3 mg/dl    GFR est AA >60.0      GFR est non-AA >60      Calcium 7.8 (L) 8.5 - 10.1 mg/dl    AST (SGOT) 42 (H) 15 - 37 U/L    ALT (SGPT) 35  12 - 78 U/L    Alk. phosphatase 128 (H) 45 - 117 U/L    Bilirubin, total 0.6 0.2 - 1.0 mg/dl     Protein, total 5.7 (L) 6.4 - 8.2 gm/dl    Albumin 2.3 (L) 3.4 - 5.0 gm/dl    Anion gap 5 5 - 15 mmol/L   BILIRUBIN, DIRECT    Collection Time: 08/30/18  5:08 AM   Result Value Ref Range    Bilirubin, direct 0.1 0.0 - 0.2 mg/dl   CBC WITH AUTOMATED DIFF    Collection Time: 08/30/18  8:46 AM   Result Value Ref Range    WBC 4.3 4.0 - 11.0 1000/mm3    RBC 3.60 3.60 - 5.20 M/uL    HGB 11.7 (L) 13.0 - 17.2 gm/dl    HCT 37.1 37.0 - 50.0 %    MCV 103.1 (H) 80.0 - 98.0 fL    MCH 32.5 25.4 - 34.6 pg    MCHC 31.5 30.0 - 36.0 gm/dl    PLATELET 203 140 - 450 1000/mm3    MPV 11.6 (H) 6.0 - 10.0 fL    RDW-SD 55.0 (H) 36.4 - 46.3      NRBC 0 0 - 0      IMMATURE GRANULOCYTES 0.2 0.0 - 3.0 %    NEUTROPHILS 45.9 34 - 64 %    LYMPHOCYTES 43.1 28 - 48 %    MONOCYTES 8.1 1 - 13 %    EOSINOPHILS 2.5 0 - 5 %    BASOPHILS 0.2 0 - 3 %       XR Results:  Results from Hospital Encounter encounter on 08/28/18   XR CHEST SNGL V    Narrative Indication: chest pain.    .      Impression IMPRESSION: Portable AP upright view the chest exposed at 1:44 PM August 28, 2018  reveals the lungs are clear and the heart is of normal size.Marland Kitchen  No change from  March 19, 2017.         CT Results:  Results from Hospital Encounter encounter on 08/28/18   CT ABD PELV W CONT    Narrative EXAMINATION: CT ABD PELV W CONT    CLINICAL INDICATION: Upper abdominal pain         COMPARISON:  April 25, 2018    TECHNIQUE:  All CT exams at this facility use one or more dose reduction  techniques including automatic exposure control, ma/kV  adjustment per patient's  size, or iterative reconstruction technique. DICOM format image data available  to non-affiliated external healthcare facilities or entities on a secure, media  free, reciprocal searchable basis with patient authorization for 12 months  following the date of the study.    Multiple contiguous axial CT images at 5  mm increments were obtained from the   bases of the lungs to the  pubic symphysis with intravenous contrast.    FINDINGS:    Visualized lungs/chest: Unremarkable. Gallbladder normal. No intrahepatic  biliary duct dilatation.    Hepatobiliary: Unremarkable. Gallbladder normal.    Kidneys/ureters: Unremarkable.    Stomach: Postop changes about the stomach. Inflammation within the duodenal  sweep.    Adrenal glands: Unremarkable.    Pancreas: Pancreatic head and uncinate process indistinct with adjacent  inflammation. Pancreatic body and tail normal. No pancreatic duct dilatation.  Previous pancreatic head pseudocyst has resolved.    Spleen: Unremarkable.    Vascular structures:  Unremarkable.    Peritoneum/mesentery/bowel:  Unremarkable.    Retroperitoneal  lymph nodes: Unremarkable    Pelvis:  Left adnexal cystic lesion 36 x 29 mm, new. Appendix normal         Bones: No significant lesions.    Miscellaneous: None.      Impression IMPRESSION:     1. Pancreatitis  2. Resolution of pancreatic head pseudocyst.  3. Left adnexal cystic lesion, likely ovarian cyst. Recommend follow-up pelvic  ultrasound in 6 weeks to ensure resolution.         MRI Results:  Results from Hospital Encounter encounter on 04/03/18   MRI ABD W WO CONT    Narrative MRI ABDOMEN WITH AND WITHOUT CONTRAST    CLINICAL HISTORY: Right upper quadrant pain    COMPARISON: CT dated 04/03/2018    TECHNIQUE: Multiplanar, multisequence imaging of the upper abdomen was performed  before and after administration of intravenous contrast.    FINDINGS:    Subsegmental atelectasis left greater than right lung base.    2.7 cm focal lesion within the left hepatic lobe, adjacent to the falciform  ligament is consistent with focal hepatic steatosis. This region manifested as  signal dropout on the out of phase sequence. Liver, otherwise grossly  unremarkable. Gallbladder, spleen, adrenal glands and kidneys grossly  unremarkable. Gastric bypass surgery. Inflammatory change adjacent to the   pancreatic head and uncinate process. There is also thickening of the distal  second and proximal third portions of the duodenum. Adjacent to the junction of  the second and third portions of the duodenum, there is a peripherally enhancing  8.5 x 4.6 x 2.8 cm (ML by CC by AP) fluid collection. The fluid collection is  intimate with the inferior margin of the duodenum within this region, and  extends to the right anterior perirenal fascia. This is concerning for a  developing abscess. This may be related to a contained, ruptured ulcer. Other  differential considerations may include pancreatic pseudocyst with possible  superimposed inflammation of the pancreatic head/uncinate process.      Impression IMPRESSION:  1. Lesion within the liver visualized on previous CT is most consistent with  focal fatty infiltration. No aggressive process.  2. Prominent peripherally enhancing fluid collection adjacent to the pancreatic  head/uncinate process with extensive surrounding inflammatory change as  described. This is concerning for a developing abscess. This may be related to a  contained, ruptured ulcer. Other differential considerations may include  pancreatic pseudocyst with possible superimposed inflammation of the pancreatic  head/uncinate process.  3. Subsegmental atelectasis within the lung bases.         Nuclear Medicine Results:  No results found for this or any previous visit.    [place] Results:  Results from [place] encounter on 08/28/18   [place] RUQ    Narrative History: RUQ pain.          Impression Impression: Fatty liver. Small gallbladder polyp. Stable peripancreatic head 3  cm fluid collection.    Comment: Sonography of the right upper quadrant was performed. This was compared  with CT abdomen and pelvis April 25, 2018 and right upper quadrant ultrasound  April 08, 2018.    The liver is of mild increased echotexture from fatty infiltration. The intra   and extrahepatic ducts are of normal caliber. The common bile duct measures 4  millimeters. A 6 mm polyp is noted adherent to the gallbladder wall. Otherwise  the wall is of normal thickness. Sonographic Percell Miller sign is negative. No  definite gallstones. No pericholecystic fluid. A 3.1 x 2.1  cm fluid collection  is again noted anterior to the head of the pancreas unchanged from February  2020. tThe right kidney is unremarkable..         IR Results:  No results found for this or any previous visit.    VAS/US Results:  Results from Hospital Encounter encounter on 01/20/18   DUPLEX LOWER EXT VENOUS RIGHT    Narrative                                                               Study ID:   [id number]                                                 [facility]                                            [address] [place],                                          [place]                                   Lower Extremity Venous Report    Name: TENNESSEE, HANLON Date: 01/20/2018 08:00 PM  MRN: [id number]  Patient Location: [id number]  DOB: [birth year]                  Age: 55 yrs  Gender: Female                   Account #: [id number]  Reason For Study: Right leg pain  Ordering Physician: Johnella Moloney    Performed By: Lowella Dell, RVS    Interpretation Summary  No evidence of superficial or deep vein thrombosis noted in the right lower  extremity.  No evidence of deep venous thrombosis in the contralateral/left common   femoral  vein.  _____________________________________________________________________________  __      QUALITY/PROCEDURE   Limited unilateral venous duplex performed of the right leg. Quality of the  study is good. M79.604.    HISTORY/SYMPTOMS  Right leg pain.    RIGHT LEG  The common femoral, femoral, popliteal, posterior tibial and peroneal veins  were examined with duplex ultrasound. The deep veins were patent and  compressible with no evidence of intraluminal thrombus. Spontaneous, phasic  venous flow with normal augmentation noted throughout the right leg.    RIGHT SAPHENOUS VEINS  Spectral Doppler exam demonstrates normal venous flow in the right great  saphenous vein. Compression of the right great saphenous vein is complete.  Filling defects in the right great saphenous vein are absent.      LEFT LEG  Spectral Doppler exam demonstrates normal venous flow in the left common  femoral vein. Compression of the left common femoral vein is complete.   Filling  defects in the left common femoral vein are absent.      Electronically signed byDR Wenda Low, MD   01/21/2018 01:10 AM         Total clinical care time was 35  minutes of which more than 50% was spent in coordination of care and counseling (time spent with patient/family face to face, physical exam, reviewing laboratory and imaging investigations, speaking with physicians and nursing staff involved in this patient's care).     Perfecto Kingdom,  MD  [facility]  August 30, 2018  Time: 10:54 AM

## 2018-08-30 NOTE — Progress Notes (Signed)
Problem: Pain  Goal: *Control of Pain  Outcome: Progressing Towards Goal  Goal: *PALLIATIVE CARE:  Alleviation of Pain  Outcome: Progressing Towards Goal     Problem: Falls - Risk of  Goal: *Absence of Falls  Description: Document Schmid Fall Risk and appropriate interventions in the flowsheet.  Outcome: Progressing Towards Goal  Note: Fall Risk Interventions:            Medication Interventions: Bed/chair exit alarm, Patient to call before getting OOB, Teach patient to arise slowly    Elimination Interventions: Call light in reach, Patient to call for help with toileting needs, Toileting schedule/hourly rounds              Problem: Patient Education: Go to Patient Education Activity  Goal: Patient/Family Education  Outcome: Progressing Towards Goal     Problem: Pancreatitis  Goal: *Control of acute pain  Outcome: Progressing Towards Goal  Goal: *Absence of nausea/vomiting  Outcome: Progressing Towards Goal  Goal: *Optimize nutritional status  Outcome: Progressing Towards Goal  Goal: *Labs within defined limits  Outcome: Progressing Towards Goal     Problem: Risk for Spread of Infection  Goal: Prevent transmission of infectious organism to others  Description: Prevent the transmission of infectious organisms to other patients, staff members, and visitors.  Outcome: Progressing Towards Goal     Problem: Patient Education:  Go to Education Activity  Goal: Patient/Family Education  Outcome: Progressing Towards Goal

## 2018-08-30 NOTE — Progress Notes (Addendum)
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  Normal gallbladder.  Inflammation within the duodenal sweep.  Postop changes about the stomach.  Pancreatic head with use in a process in the setting with adjacent inflammation.  Pancreatic body and tail normal.  No pancreatic ductal dilatation.  Previous pancreatic head pseudocyst has resolved.  Spleen unremarkable.  Peritoneum/mesentery/bowel unremarkable.  Retroperitoneal lymph nodes unremarkable.              -[place] 08/28/2018 reviewed with Radiologist.  No evidence of fluid collection of pseudocyst identified on subsequent review of imaging with comparison. Small gallbladder polyp noted and fatty infiltration of the liver. CBD 4 mm. Intra-and extrahepatic ducts normal. No definite gallstones.  --Hx of possible sealed off duodenal ulcer perforation with possible abscess   --Elevated AST and alk phos suspect secondary to EtOH use. 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   --Diarrhea-Resolved. rule out infectious source, pancreatic insufficiency or other etiology  --Roux-en-Y Gastric Bypass  --Family hx??of Ulcerative??Colitis-Mother  --Family hx Colon??Cancer-Maternal??Grandfather    PLAN:     --Monitor LFTs, CBC, BMP, periodic lipase  --Continue Protonix '40mg'$  BID   --Acute hepatitis panel results pending   --Stool studies to include stool for C. Diff and pancreatic elastase-NOT obtained d/t No BMs   --Full liquids for now and advance to low fat low cholesterol diet as tolerated   --Monitor abdominal exam, Judicious pain management   --Continue thiamine, folic acid and MVI  --Once discharged, pt will need to follow up as an outpt with Dr. Delma Freeze in 3-4weeks     SUBJECTIVE:  Pt reports some improvement in her abdominal pain. She denies SOB, chest pain, nausea or vomiting. Tolerating liquids     OBJECTIVE:  Patient Vitals for the past 24 hrs:   Temp Pulse Resp BP SpO2   08/30/18 1118 98.2 ??F (36.8 ??C) 63 18 97/58 98 %   08/30/18 0819 98.1 ??F (36.7 ??C) (!) 59 16 115/65 100 %   08/30/18 0419 97.8 ??F (36.6 ??C) 75 16 105/80 100 %   08/29/18 2325 97.8 ??F (36.6 ??C) 82 16 105/64 99 %   08/29/18 2004 98.1 ??F (36.7 ??C) 77 16 103/61 100 %   08/29/18 1545 98.4 ??F (36.9 ??C) 67 16 131/70 100 %     Physical Exam:  GENERAL: alert, pleasant, cooperative, no distress, appears stated age  EYE: No scleral icterus noted   LUNG: clear to auscultation bilaterally  HEART: S1, S2   ABDOMEN: soft, mildly tender more prominent to epigastric area, bowel sounds active     Labs: Results:   Chemistry Recent Labs     08/30/18  0508 08/29/18  0235 08/28/18  1328   GLU 87 92 71*   NA 138 136 136   K 3.6 3.6 3.7   CL 110* 108* 106   CO2 '23 24 23   '$ BUN 3* 7  7   CREA 0.7 0.8 0.8   CA 7.8* 7.9* 8.3*   AGAP _0 AP 128* 154* 164*   TP 5.7* 6.3* 7.3   ALB 2.3* 2.6* 3.0*    Estimated Creatinine Clearance: 109.4 mL/min (based on SCr of 0.7 mg/dL).   CBC w/Diff Recent Labs     08/30/18  0846 08/29/18  0235 08/28/18  2139 08/28/18  1328   WBC 4.3 7.5  --  8.3   RBC 3.60 3.56*  --  3.87   HGB 11.7* 11.6* 11.9* 12.8*   HCT 37.1 35.8* 36.0* 38.0   PLT 203 206  --  255   GRANS 45.9 71.0*  --  73.8*   LYMPH 43.1 21.6*  --  19.1*   EOS 2.5 1.1  --  0.2      Cardiac Enzymes No results for input(s): CPK, CKND1, MYO in the last 72 hours.    No lab exists for component: CKRMB, TROIP   Coagulation No results for input(s): PTP, INR, APTT, INREXT in the last 72 hours.    Hepatitis Panel Lab Results   Component Value Date/Time    Hep B surface Ag screen Negative 12/20/2010 12:00 AM     Recent Labs      08/30/18  0508 08/29/18  0235 08/28/18  1328   ALT 35 47 61   TBILI 0.6 1.0 0.5   AP 128* 154* 164*   ALB 2.3* 2.6* 3.0*   TP 5.7* 6.3* 7.3         Amylase Lipase    Liver Enzymes Recent Labs     08/30/18  0508   TP 5.7*   ALB 2.3*   AP 128*   ALT 35      Thyroid Studies No results for input(s): T4, T3U, TSH, TSHEXT in the last 72 hours.    No lab exists for component: T3RU     Pathology pathology     Diagnostic Imaging:  No results found.    Allergies   Allergen Reactions   ??? Aspirin Other (comments)     Stomach ulcer   ??? Ibuprofen Unknown (comments)     Gastric Bypass--advised to avoid   ??? Nsaids (Non-Steroidal Anti-Inflammatory Drug) Other (comments)     Hx gastric bypass     Current Facility-Administered Medications   Medication Dose Route Frequency   ??? ondansetron (ZOFRAN) injection 4 mg  4 mg IntraVENous Q6H PRN   ??? cefTRIAXone (ROCEPHIN) 1 g in 0.9% sodium chloride (MBP/ADV) 50 mL MBP  1 g IntraVENous Q24H   ??? dextrose 5% and 0.9% NaCl infusion  100 mL/hr IntraVENous CONTINUOUS   ??? morphine injection 2 mg  2 mg IntraVENous Q4H PRN   ??? promethazine (PHENERGAN) 12.5 mg in NS 50 mL IVPB  12.5 mg IntraVENous Q6H PRN   ??? alum-mag hydroxide-simeth (MYLANTA) oral suspension 30 mL  30 mL Oral Q4H PRN   ??? pantoprazole (PROTONIX) 40 mg in 0.9% sodium chloride 10 mL injection  40 mg IntraVENous Q12H   ??? thiamine HCL (B-1) tablet 100 mg  100 mg Oral DAILY   ??? folic acid (FOLVITE) tablet 1 mg  1 mg Oral DAILY   ??? therapeutic multivitamin-minerals (THERAGRAN-M) tablet 1 Tab  1 Tab Oral DAILY   ??? sodium chloride (NS) flush 5-10 mL  5-10 mL IntraVENous Q8H   ??? sodium chloride (NS) flush 5-10 mL  5-10 mL IntraVENous PRN   ??? naloxone (NARCAN) injection 0.1 mg  0.1 mg IntraVENous PRN   ??? acetaminophen (TYLENOL) tablet 650 mg  650 mg Oral Q6H PRN   ??? HYDROcodone-acetaminophen (NORCO) 5-325 mg per tablet 1 Tab  1 Tab Oral Q4H PRN   ??? albuterol (PROVENTIL VENTOLIN) nebulizer solution 2.5 mg  2.5 mg Nebulization Q6H PRN    ??? heparin (porcine) injection 5,000 Units  5,000 Units SubCUTAneous Q8H     Active Problems:    Acute pancreatitis (08/28/2018)      Chantel Dixon MS, APRN, FNP-C  Office: [phone]  August 30, 2018    I was personally available for consultation. I have reviewed patient's record and agree with the documentation recorded by the Physician Asst/Nurse Practitioner, including the assessment, treatment plan, and disposition.    Sloan Leiter, MD  August 30, 2018

## 2018-08-30 NOTE — Progress Notes (Signed)
Patient admitted on 08/28/2018 from home with Pancreatitis    Chief Complaint   Patient presents with   ??? Abdominal Pain   ??? Shortness of Breath          Tentative dc plan: Home with family assistance       Treatment Team: Treatment Team: Attending Provider: Everlene Farrier, MD; Consulting Provider: Charlaine Dalton, MD; Consulting Provider: Everlene Farrier, MD; Care Manager: Ralph Dowdy; Consulting Provider: Elmer Bales, MD; Consulting Provider: Victorino Dike D, NP      The patient has been admitted to the hospital 1 times in the past 12 months.    Previous 4 Admission Dates Admission and Discharge Diagnosis Interventions Barriers Disposition   04/03/18-04/12/18 Abdominal Abscess                             Caregivers Participating in Plan of Care/Discharge Plan with the patient: None      Anticipated DME needs for discharge: None      Does the patient have appropriate clothing available to be worn at discharge? Yes      The patient and care participants are willing to travel N/A  area for discharge facility.  The patient and plan of care participants have been provided with a list of all available Rehab Facilities or [place] agencies as applicable. CM will follow up with a list of facilities or agencies that are offer acceptance.    CM has disclosed any financial interest that [facility] may have with any facility or agency.    Anticipated Discharge Date: 2-3 days     The plan of care and discharge plan has been discussed with Sim[place], Legrand Como, MD and all other appropriate providers and adjusted per interdisciplinary team recommendations and in discussion with the patient and the patient designated Care Plan Participants.    Barriers to Healthcare Success/ Readmission Risk Factors: None    Consults:  Palliative Care Consult Recommended: No  Transitional Care Clinic Referral: No  Transitional Nurse Navigator Referral: No  Oncology Navigator Referral: No  SW consulted: No   [facility] Consulted: No  Outside Hospital/Community Resources Referrals and Collaboration: No    Food/Nutrition Needs:   No      Dietician Consulted: No    RRAT Score: Low Risk            8       Total Score        4 IP Visits Last 12 Months (1-3=4, 4=9, >4=11)    4 Pt. Coverage (Medicare=5 , Medicaid, or Self-Pay=4)        Criteria that do not apply:    Has Seen PCP in Last 6 Months (Yes=3, No=0)    Married. Living with Significant Other. Assisted Living. LTAC. SNF. or   Rehab    Patient Length of Stay (>5 days = 3)    Charlson Comorbidity Score (Age + Comorbid Conditions)           PCP: Roosevelt Locks, Da, MD .   How do you get to your doctor appointments? Sister drives     Specialists:     [facility]   Are there any medications that you have trouble paying for? No  Any difficulty getting your medications? No    DME available at Home: None      Home O2 Provider: No  Home O2 L Flow:  No  Dialysis Unit N/A     Home Environment and Prior Level of Function: [address] at [address]  [place] [id number] @HOMEPHONE @.     Lives with boyfriend in a 2 story home with 3 steps    Responsibilities at home include ADLS       Prior to admission open services: No    Home Health Agency-  Personal Care Agency-    Extended Emergency Contact Information  Primary Emergency Contact: [facility],Derrick   [place]  Home Phone: [phone]  Mobile Phone: [phone]  Relation: Boyfriend  Secondary Emergency Contact: Parks RangerGRIFFIN, THEORDORE  Mobile Phone: [phone]  Relation: Friend     Transportation: Sister  will transport home    Therapy Recommendations:    OT = No    PT = No    SLP =  No     RT Home O2 Evaluation =  No    Wound Care =  No    Case Management Assessment    ABUSE/NEGLECT SCREENING   Physical Abuse/Neglect: Denies   Sexual Abuse: Denies   Sexual Abuse: Denies   Other Abuse/Issues: Denies          PRIMARY DECISION MAKER    SELF                                 CARE MANAGEMENT INTERVENTIONS   Readmission Interview Completed: Not Applicable   PCP Verified by CM: Yes           Mode of Transport at Discharge: BLS       Transition of Care Consult (CM Consult): Discharge Planning           MyChart Signup: No   Discharge Durable Medical Equipment: No   Physical Therapy Consult: No   Occupational Therapy Consult: No   Speech Therapy Consult: No   Current Support Network: Other(Lives with boyfriend )   Reason for Referral: DCP Rounds   History Provided By: Patient, Medical Record   Patient Orientation: Alert and Oriented, Person, Place, Situation, Self   Cognition: Alert   Support System Response: Cooperative   Previous Living Arrangement: Lives with Family Independent   Home Accessibility: Steps(3 steps )   Prior Functional Level: Independent in ADLs/IADLs   Current Functional Level: Independent in ADLs/IADLs       Can patient return to prior living arrangement: Yes   Ability to make needs known:: Good   Family able to assist with home care needs:: Yes   Pets: None           Types of Needs Identified: Disease Management Education, Treatment Education       Confirm Follow Up Transport: Family   Confirm Transport and Arrange: Yes              DISCHARGE LOCATION   Discharge Placement: Home with family assistance

## 2018-08-30 NOTE — Progress Notes (Signed)
Problem: Falls - Risk of  Goal: *Absence of Falls  Description: Document Schmid Fall Risk and appropriate interventions in the flowsheet.  Outcome: Progressing Towards Goal  Note: Fall Risk Interventions:            Medication Interventions: Patient to call before getting OOB, Teach patient to arise slowly    Elimination Interventions: Call light in reach, Stay With Me (per policy)              Problem: Pain  Goal: *Control of Pain  Outcome: Progressing Towards Goal     Problem: Pancreatitis  Goal: *Control of acute pain  Outcome: Progressing Towards Goal     Problem: Pancreatitis  Goal: *Absence of nausea/vomiting  Outcome: Progressing Towards Goal

## 2018-08-30 NOTE — Other (Signed)
Bedside shift change report given to Rachel, RN (oncoming nurse) by Lija, RN (offgoing nurse). Report included the following information SBAR, Kardex, Procedure Summary, Intake/Output, MAR and Recent Results.

## 2018-08-30 NOTE — Progress Notes (Signed)
[facility] Progress Note    Patient: Ashley [name] MRN: [id number]  SSN: [id number]    Date of Birth: 12/06/1985  Age: 33 y.o.  Sex: female      Admit Date: 08/28/2018    LOS: 2 days     Subjective:     Still with some nausea but feeling better from abdominal pain    Objective:     Vitals:    08/29/18 2004 08/29/18 2325 08/30/18 0419 08/30/18 0819   BP: 103/61 105/64 105/80 115/65   Pulse: 77 82 75 (!) 59   Resp: 16 16 16 16   Temp: 98.1 ??F (36.7 ??C) 97.8 ??F (36.6 ??C) 97.8 ??F (36.6 ??C) 98.1 ??F (36.7 ??C)   SpO2: 100% 99% 100% 100%   Weight:       Height:            Intake and Output:  Current Shift: 06/18 0701 - 06/18 1900  In: -   Out: 800 [Urine:800]  Last three shifts: 06/16 1901 - 06/18 0700  In: 3330 [P.O.:400; I.V.:2930]  Out: -     Physical Exam:   ABDOMEN: soft, some epigastric tenderness    Lab/Data Review:  No results found.        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  Patient would probably benefit from having her gallbladder out electively because of a 6 mm polyp but no immediate cholecystectomy is necessary.  Gunhild Bautch L Kindra Bickham, MD  August 30, 2018

## 2018-08-31 LAB — CBC WITH AUTO DIFFERENTIAL
Basophils %: 0.2 % (ref 0–3)
Eosinophils %: 1.7 % (ref 0–5)
Hematocrit: 32 % — ABNORMAL LOW (ref 37.0–50.0)
Hemoglobin: 10.1 gm/dl — ABNORMAL LOW (ref 13.0–17.2)
Immature Granulocytes: 0.2 % (ref 0.0–3.0)
Lymphocytes %: 48 % (ref 28–48)
MCH: 32.5 pg (ref 25.4–34.6)
MCHC: 31.6 gm/dl (ref 30.0–36.0)
MCV: 102.9 fL — ABNORMAL HIGH (ref 80.0–98.0)
MPV: 11.7 fL — ABNORMAL HIGH (ref 6.0–10.0)
Monocytes %: 9.2 % (ref 1–13)
Neutrophils %: 40.7 % (ref 34–64)
Nucleated RBCs: 0 (ref 0–0)
Platelets: 196 10*3/uL (ref 140–450)
RBC: 3.11 M/uL — ABNORMAL LOW (ref 3.60–5.20)
RDW-SD: 55.5 — ABNORMAL HIGH (ref 36.4–46.3)
WBC: 5.2 10*3/uL (ref 4.0–11.0)

## 2018-08-31 LAB — COMPREHENSIVE METABOLIC PANEL
ALT: 37 U/L (ref 12–78)
AST: 68 U/L — ABNORMAL HIGH (ref 15–37)
Albumin: 2.3 gm/dl — ABNORMAL LOW (ref 3.4–5.0)
Alkaline Phosphatase: 133 U/L — ABNORMAL HIGH (ref 45–117)
Anion Gap: 4 mmol/L — ABNORMAL LOW (ref 5–15)
BUN: 2 mg/dl — ABNORMAL LOW (ref 7–25)
CO2: 26 mEq/L (ref 21–32)
Calcium: 7.9 mg/dl — ABNORMAL LOW (ref 8.5–10.1)
Chloride: 111 mEq/L — ABNORMAL HIGH (ref 98–107)
Creatinine: 0.7 mg/dl (ref 0.6–1.3)
EGFR IF NonAfrican American: >60
GFR African American: >60
Glucose: 91 mg/dl (ref 74–106)
Potassium: 4.1 mEq/L (ref 3.5–5.1)
Sodium: 140 mEq/L (ref 136–145)
Total Bilirubin: 0.3 mg/dl (ref 0.2–1.0)
Total Protein: 5.8 gm/dl — ABNORMAL LOW (ref 6.4–8.2)

## 2018-08-31 LAB — HEPATITIS PANEL, ACUTE
HCV Ab: NONREACTIVE
Hep A IgM: NONREACTIVE
Hep B Core Ab, IgM: NONREACTIVE
Hepatitis A, IgM: NONREACTIVE
Hepatitis B Surface Ag: NONREACTIVE
Hepatitis B core, IgM: NONREACTIVE
Hepatitis B surface Ag: NONREACTIVE
Hepatitis C virus Ab: NONREACTIVE
SIGNAL TO CUTOFF (HEP C): 0
Signal to Cutoff (Hep C): 0

## 2018-08-31 LAB — CBC WITH AUTOMATED DIFF
BASOPHILS: 0.2 % (ref 0–3)
EOSINOPHILS: 1.7 % (ref 0–5)
HCT: 32 % — ABNORMAL LOW (ref 37.0–50.0)
HGB: 10.1 gm/dl — ABNORMAL LOW (ref 13.0–17.2)
IMMATURE GRANULOCYTES: 0.2 % (ref 0.0–3.0)
LYMPHOCYTES: 48 % (ref 28–48)
MCH: 32.5 pg (ref 25.4–34.6)
MCHC: 31.6 gm/dl (ref 30.0–36.0)
MCV: 102.9 fL — ABNORMAL HIGH (ref 80.0–98.0)
MONOCYTES: 9.2 % (ref 1–13)
MPV: 11.7 fL — ABNORMAL HIGH (ref 6.0–10.0)
NEUTROPHILS: 40.7 % (ref 34–64)
NRBC: 0 (ref 0–0)
PLATELET: 196 10*3/uL (ref 140–450)
RBC: 3.11 M/uL — ABNORMAL LOW (ref 3.60–5.20)
RDW-SD: 55.5 — ABNORMAL HIGH (ref 36.4–46.3)
WBC: 5.2 10*3/uL (ref 4.0–11.0)

## 2018-08-31 LAB — METABOLIC PANEL, COMPREHENSIVE
ALT (SGPT): 37 U/L (ref 12–78)
AST (SGOT): 68 U/L — ABNORMAL HIGH (ref 15–37)
Albumin: 2.3 gm/dl — ABNORMAL LOW (ref 3.4–5.0)
Alk. phosphatase: 133 U/L — ABNORMAL HIGH (ref 45–117)
Anion gap: 4 mmol/L — ABNORMAL LOW (ref 5–15)
BUN: 2 mg/dl — ABNORMAL LOW (ref 7–25)
Bilirubin, total: 0.3 mg/dl (ref 0.2–1.0)
CO2: 26 mEq/L (ref 21–32)
Calcium: 7.9 mg/dl — ABNORMAL LOW (ref 8.5–10.1)
Chloride: 111 mEq/L — ABNORMAL HIGH (ref 98–107)
Creatinine: 0.7 mg/dl (ref 0.6–1.3)
GFR est AA: >60
GFR est non-AA: >60
Glucose: 91 mg/dl (ref 74–106)
Potassium: 4.1 mEq/L (ref 3.5–5.1)
Protein, total: 5.8 gm/dl — ABNORMAL LOW (ref 6.4–8.2)
Sodium: 140 mEq/L (ref 136–145)

## 2018-08-31 MED ORDER — FOLIC ACID 1 MG TAB
1 mg | ORAL_TABLET | Freq: Every day | ORAL | 0 refills | Status: AC
Start: 2018-08-31 — End: ?

## 2018-08-31 MED ORDER — CYANOCOBALAMIN 1,000 MCG/ML IJ SOLN
1000 mcg/mL | Freq: Once | INTRAMUSCULAR | Status: AC
Start: 2018-08-31 — End: 2018-08-31
  Administered 2018-08-31: 18:00:00 via INTRAMUSCULAR

## 2018-08-31 MED ORDER — BISACODYL 10 MG RECTAL SUPPOSITORY
10 mg | Freq: Once | RECTAL | Status: AC
Start: 2018-08-31 — End: 2018-08-31
  Administered 2018-08-31: 18:00:00 via RECTAL

## 2018-08-31 MED ORDER — THIAMINE HCL 100 MG TAB
100 mg | ORAL_TABLET | Freq: Every day | ORAL | 0 refills | Status: AC
Start: 2018-08-31 — End: ?

## 2018-08-31 MED ORDER — OMEPRAZOLE 20 MG CAP, DELAYED RELEASE
20 mg | ORAL_CAPSULE | Freq: Two times a day (BID) | ORAL | 1 refills | Status: AC
Start: 2018-08-31 — End: ?

## 2018-08-31 MED ORDER — CEPHALEXIN 500 MG CAP
500 mg | ORAL_CAPSULE | Freq: Three times a day (TID) | ORAL | 0 refills | Status: AC
Start: 2018-08-31 — End: ?

## 2018-08-31 MED ORDER — ONDANSETRON 4 MG TAB, RAPID DISSOLVE
4 mg | ORAL_TABLET | Freq: Three times a day (TID) | ORAL | 0 refills | Status: AC | PRN
Start: 2018-08-31 — End: ?

## 2018-08-31 MED FILL — ONDANSETRON (PF) 4 MG/2 ML INJECTION: 4 mg/2 mL | INTRAMUSCULAR | Qty: 2

## 2018-08-31 MED FILL — BD POSIFLUSH NORMAL SALINE 0.9 % INJECTION SYRINGE: INTRAMUSCULAR | Qty: 10

## 2018-08-31 MED FILL — HEPARIN (PORCINE) 5,000 UNIT/ML IJ SOLN: 5000 unit/mL | INTRAMUSCULAR | Qty: 1

## 2018-08-31 MED FILL — MORPHINE 2 MG/ML INJECTION: 2 mg/mL | INTRAMUSCULAR | Qty: 1

## 2018-08-31 MED FILL — BISAC-EVAC 10 MG RECTAL SUPPOSITORY: 10 mg | RECTAL | Qty: 1

## 2018-08-31 MED FILL — HYDROCODONE-ACETAMINOPHEN 5 MG-325 MG TAB: 5-325 mg | ORAL | Qty: 1

## 2018-08-31 MED FILL — PANTOPRAZOLE 40 MG IV SOLR: 40 mg | INTRAVENOUS | Qty: 40

## 2018-08-31 MED FILL — CEFTRIAXONE 1 GRAM SOLUTION FOR INJECTION: 1 gram | INTRAMUSCULAR | Qty: 1

## 2018-08-31 MED FILL — MAG-AL PLUS 200 MG-200 MG-20 MG/5 ML ORAL SUSPENSION: 200-200-20 mg/5 mL | ORAL | Qty: 30

## 2018-08-31 MED FILL — VITAMIN B-1 100 MG TABLET: 100 mg | ORAL | Qty: 1

## 2018-08-31 MED FILL — VITAMINS AND MINERALS TABLET: ORAL | Qty: 1

## 2018-08-31 MED FILL — PROMETHAZINE IN NS 12.5 MG/50 ML IV PIGGY BAG: 12.5 mg/50 ml | INTRAVENOUS | Qty: 50

## 2018-08-31 MED FILL — CYANOCOBALAMIN 1,000 MCG/ML IJ SOLN: 1000 mcg/mL | INTRAMUSCULAR | Qty: 1

## 2018-08-31 MED FILL — FOLIC ACID 1 MG TAB: 1 mg | ORAL | Qty: 1

## 2018-08-31 NOTE — Progress Notes (Signed)
Problem: Pain  Goal: *Control of Pain  Outcome: Progressing Towards Goal     Problem: Falls - Risk of  Goal: *Absence of Falls  Description: Document Schmid Fall Risk and appropriate interventions in the flowsheet.  Outcome: Progressing Towards Goal  Note: Fall Risk Interventions:            Medication Interventions: Bed/chair exit alarm    Elimination Interventions: Bed/chair exit alarm

## 2018-08-31 NOTE — Progress Notes (Signed)
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  Normal gallbladder.  Inflammation within the duodenal sweep.  Postop changes about the stomach.  Pancreatic head with use in a process in the setting with adjacent inflammation.  Pancreatic  body and tail normal.  No pancreatic ductal dilatation.  Previous pancreatic head pseudocyst has resolved.  Spleen unremarkable.  Peritoneum/mesentery/bowel unremarkable.  Retroperitoneal lymph nodes unremarkable.               -[place] 08/28/2018 reviewed with Radiologist.  No evidence of fluid collection of pseudocyst identified on subsequent review of imaging with comparison. Small gallbladder polyp noted and fatty infiltration of the liver. CBD 4 mm.  Intra-and extrahepatic ducts normal. No definite gallstones.   --Hx of possible sealed off duodenal ulcer perforation with possible abscess    --Elevated AST and alk phos suspect secondary to EtOH use.     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    --Diarrhea-Resolved. Ddx: infectious source, pancreatic insufficiency or other etiology   --Roux-en-Y Gastric Bypass   --Family hx??of Ulcerative??Colitis-Mother    --Family hx Colon??Cancer-Maternal??Grandfather      PLAN:      --Monitor LFTs, CBC, BMP, periodic lipase   --Continue Protonix 92m BID    --Stool studies to include stool for C. Diff and pancreatic elastase-NOT obtained d/t No BMs    --Advance to low fat low cholesterol diet as tolerated    --Monitor abdominal exam, Judicious pain management   --No further GI recommendations or interventions planned at this time   --Continue thiamine, folic acid and MVI   --Once discharged, pt will need to follow up as an outpt with Dr. TDelma Freezein 3Donald      Will sign off for now and be available as needed       SUBJECTIVE:   Pt reports some improvement in her abdominal pain. She denies SOB, chest pain, nausea or vomiting. Tolerating diet       OBJECTIVE:   Patient Vitals for the past 24 hrs:            Temp  Pulse  Resp  BP  SpO2            08/31/18 0849  98.3 ??F (36.8 ??C)  64  18  109/62  100 %            08/31/18 0517  97.9 ??F (36.6 ??C)  75  18  113/66  100 %     08/30/18 2342  --  83  18  109/64  100 %     08/30/18 2052  98.5 ??F (36.9 ??C)  61  20  105/66  100 %     08/30/18 1529  98.3 ??F (36.8 ??C)  65  16  102/82  96 %            08/30/18 1118  98.2 ??F (36.8 ??C)  63  18  97/58  98 %        Physical Exam:   GENERAL: alert, pleasant, cooperative, no distress, appears stated age   EYE: No scleral icterus noted    LUNG: clear to auscultation bilaterally   HEART: S1, S2    ABDOMEN: soft, mildly tender  more prominent to epigastric area, bowel sounds active          Labs:  Results:        Chemistry  Recent Labs         08/31/18   0056  08/30/18   0508  08/29/18   0235      GLU  91  87  92      NA  140  138  136      K  4.1  3.6  3.6      CL  111*  110*  108*      CO2  26  23  24       BUN  2*  3*  7      CREA  0.7  0.7  0.8      CA  7.9*  7.8*  7.9*      AGAP  4*  5  5      AP  133*  128*  154*      TP  5.8*  5.7*  6.3*      ALB  2.3*  2.3*  2.6*          Estimated Creatinine Clearance: 109.4 mL/min (based on SCr of 0.7 mg/dL).      CBC w/Diff  Recent Labs         08/31/18   0056  08/30/18   0846  08/29/18   0235      WBC  5.2  4.3  7.5      RBC  3.11*  3.60  3.56*      HGB  10.1*  11.7*  11.6*      HCT  32.0*  37.1  35.8*      PLT  196  203  206      GRANS  40.7  45.9  71.0*      LYMPH  48.0  43.1  21.6*      EOS  1.7  2.5  1.1              Cardiac Enzymes  No results for input(s): CPK, CKND1, MYO in the last 72 hours.      No lab exists for component: CKRMB, TROIP        Coagulation  No results for input(s): PTP, INR, APTT, INREXT, INREXT in the last 72 hours.  Hepatitis Panel  Lab Results      Component  Value  Date/Time        Hep B surface Ag screen  Negative  12/20/2010 12:00 AM            Recent Labs         08/31/18   0056  08/30/18   0508  08/29/18   0235      ALT  37  35  47      TBILI  0.3  0.6  1.0      AP  133*  128*  154*      ALB  2.3*  2.3*  2.6*      TP  5.8*  5.7*  6.3*                       Amylase Lipase          Liver Enzymes  Recent Labs         08/31/18   0056      TP  5.8*      ALB  2.3*      AP  133*      ALT  37                 Thyroid Studies  No results for input(s): T4, T3U, TSH, TSHEXT, TSHEXT in the last 72 hours.      No lab exists for component: T3RU       Pathology  pathology        Diagnostic Imaging:   No results found.        Allergies        Allergen  Reactions         ?  Aspirin  Other (comments)             Stomach ulcer         ?  Ibuprofen  Unknown (comments)             Gastric Bypass--advised to avoid         ?  Nsaids (Non-Steroidal Anti-Inflammatory Drug)  Other (comments)             Hx gastric bypass          Current Facility-Administered Medications          Medication  Dose  Route  Frequency           ?  ondansetron (ZOFRAN) injection 4 mg   4 mg  IntraVENous  Q6H PRN     ?  cefTRIAXone (ROCEPHIN) 1 g in 0.9% sodium chloride (MBP/ADV) 50 mL MBP   1 g  IntraVENous  Q24H     ?  dextrose 5% and 0.9% NaCl infusion   100 mL/hr  IntraVENous  CONTINUOUS     ?  morphine injection 2 mg   2 mg   IntraVENous  Q4H PRN           ?  promethazine (PHENERGAN) 12.5 mg in NS 50 mL IVPB   12.5 mg  IntraVENous  Q6H PRN           ?  alum-mag hydroxide-simeth (MYLANTA) oral suspension 30 mL   30 mL  Oral  Q4H PRN     ?  pantoprazole (PROTONIX) 40 mg in 0.9% sodium chloride 10 mL injection   40 mg  IntraVENous  Q12H     ?  thiamine HCL (B-1) tablet 100 mg  100 mg  Oral  DAILY     ?  folic acid (FOLVITE) tablet 1 mg   1 mg  Oral  DAILY     ?  therapeutic multivitamin-minerals (THERAGRAN-M) tablet 1 Tab   1 Tab  Oral  DAILY     ?  sodium chloride (NS) flush 5-10 mL   5-10 mL  IntraVENous  Q8H     ?  sodium chloride (NS) flush 5-10 mL   5-10 mL  IntraVENous  PRN     ?  naloxone (NARCAN) injection 0.1 mg   0.1 mg  IntraVENous  PRN     ?  acetaminophen (TYLENOL) tablet 650 mg   650 mg  Oral  Q6H PRN     ?  HYDROcodone-acetaminophen (NORCO) 5-325 mg per tablet 1 Tab   1 Tab  Oral  Q4H PRN     ?  albuterol (PROVENTIL VENTOLIN) nebulizer solution 2.5 mg   2.5 mg  Nebulization  Q6H PRN           ?  heparin (porcine) injection 5,000 Units   5,000 Units  SubCUTAneous  Q8H        Active Problems:     Acute pancreatitis (08/28/2018)         Chantel Dixon MS, APRN, FNP-C   Office: [phone]   August 31, 2018      I was personally available for consultation. I have reviewed patient's record and  agree with the documentation recorded by the Physician Asst/Nurse Practitioner, including the assessment, treatment plan, and disposition.      Sloan Leiter, MD   August 31, 2018

## 2018-08-31 NOTE — Discharge Summary (Signed)
Discharge Summary by Everlene Farrier, MD at 08/31/18 1434                Author: Everlene Farrier, MD  Service: Hospitalist  Author Type: Physician       Filed: 08/31/18 1440  Date of Service: 08/31/18 1434  Status: Signed          Editor: Everlene Farrier, MD (Physician)                  Discharge Summary                   Patient ID:   Ashley [name], 33 y.o.,  female   DOB: Aug 21, 1985      Admit Date: 08/28/2018   Discharge Date:  08/31/2018   Length of stay: 3 day(s)      PCP:  Roosevelt Locks Da, MD        Chief Complaint       Patient presents with        ?  Abdominal Pain        ?  Shortness of Breath              Hospital Problems   Date Reviewed:  04/11/2018                         Codes  Class  Noted  POA              Acute pancreatitis  ICD-10-CM: K85.90   ICD-9-CM: 563.1    08/28/2018  Unknown                          Discharge Diagnosis:       1.  RUQ and epigastric pain - could be from pancreatitis or gastritis. Pain has resolved   2.  Acute pancreatitis - suspect due to alcohol use   3.  Elevated LFT - could be from alcohol use   4.  Diarrhea for 2 weeks prior to admission, no recent antibiotic use   5.  Small gall bladder polyp   6.  Hospitalization in Jan 2020 for peripancreatic cyst/ abscess - presumed to be due to ruptured duodenal ulcer. Treated with antibiotics   7.  UTI present on admission - klebsiella   8.  History of Roux en Y gastric bypass   9.  Obesity BMI 36   10.  Daily alcohol use   11.  Left adnexal cyst - likely ovarian cyst - outpatient follow up      Hospital course   Ms. Gundrum was admitted to medical floor   For pancreatitis she was started on supportive care, bowel rest, pain management.      RUQ ultrasound - Fatty liver. Small gallbladder polyp. Stable peripancreatic head 3 cm fluid collection.   However, CT abdomen - . Pancreatitis ;Resolution of pancreatic head pseudocyst   ??   After last hospitalization in Jan 2020, patient was supposed to follow up with general sugery Dr.  Laurance Flatten to schedule lap cholecystectomy and GI Dr. Delma Freeze. But she could not follow up      General surgery Dr. Jonathon Jordan was consulted. Recommends outpatient follow up for scheduling cholecystectomy      GI was consulted. Recommend to continue PPI BID.      Patient is recovering well with conservative treatment. Her abdominal pain has resolved. Lipase is normal. Acute hepatitis panel is  negative. She has been strongly counseled to quit drinking alcohol. She is tolerating low fat diet. She has had no diarrhea  during hospitalization.   Medically stable for discharge on 08/31/2018               Discharge instructions:     1.  Check CBC CMP in one week - script provided to patient. She should follow up with her primary physician   2.  Discussed with patient multiple times to maintain follow up with general surgery for cholecystectomy in view of gall bladder polyp   3.  Will need close follow up with GI   4.  No NSAIDS      Follow-up:   PCP Dr. Roosevelt Locks - one week   GI Dr. Delma Freeze - 2-3 weeks   General surgery Dr. Jonathon Jordan - 2-3 weeks         HPI on Admission (per admitting physician Dr. Winona Legato):   Ashley [name] is a 33 y.o. BLACK OR [place] female who presents with shortness of breath and chest pain   Patient has history of duodenal ulcer and perforation 5 months ago, gastric bypass has been having upper abdominal pain similar to when she was admitted to the hospital in Jan   She was under the impression that she had pancreatitis, has had a fluid collection inferior to the pancreatic head which was likely considered to be pancreatic pseudocyst but later was felt to be from a ruptured ulcer with abscess and was seen by infectious  disease, gastroenterology   Patient describes some chills but not had high-grade fever         Condition at discharge:   stable      Disposition: home      Code status : full code            Consultants:     1.  Gastroenterology   2.  General surgery      Physical Exam on Discharge:    Visit Vitals      BP  105/62     Pulse  (!) 115     Temp  98 ??F (36.7 ??C)     Resp  16     Ht  5' (1.524 m)     Wt  84.8 kg (187 lb)     SpO2  100%        BMI  36.52 kg/m??        Gen - AAOx3, NAD   HEENT -  mucous membranes moist   Neck - supple, no JVD   Cardiac - RRR, S1, S2, no murmurs   Chest/Lungs - clear to auscultation without wheezes or rhonchi   Abdomen - soft, nontender, non distended, normal bowel sounds   Extremities - no clubbing/ cyanosis/ edema   Neuro - CN 2-12 intact. No motor or sensory deficit.   Skin - no rashes or lesions      Discharge medications :     Current Discharge Medication List              START taking these medications          Details        omeprazole (PRILOSEC) 20 mg capsule  Take 1 Cap by mouth two (2) times a day. Please open capsule and take granules with apple sauce   Qty: 60 Cap, Refills:  1               folic acid (FOLVITE)  1 mg tablet  Take 1 Tab by mouth daily.   Qty: 30 Tab, Refills:  0               thiamine HCL (B-1) 100 mg tablet  Take 1 Tab by mouth daily.   Qty: 30 Tab, Refills:  0               cephALEXin (Keflex) 500 mg capsule  Take 1 Cap by mouth three (3) times daily.   Qty: 6 Cap, Refills:  0                     CONTINUE these medications which have CHANGED          Details        ondansetron (Zofran ODT) 4 mg disintegrating tablet  Take 1 Tab by mouth every eight (8) hours as needed for Nausea.   Qty: 20 Tab, Refills:  0                     CONTINUE these medications which have NOT CHANGED          Details        ferrous sulfate 325 mg (65 mg iron) tablet  Take 1 Tab by mouth daily (with breakfast). Indications: anemia from inadequate iron   Qty: 30 Tab, Refills:  0               acetaminophen (TYLENOL) 325 mg tablet  Take 2 Tabs by mouth every six (6) hours as needed for Pain.   Qty: 20 Tab, Refills:  0                               Most Recent Labs:     Recent Results (from the past 24 hour(s))     CBC WITH AUTOMATED DIFF          Collection Time: 08/31/18  12:56 AM         Result  Value  Ref Range            WBC  5.2  4.0 - 11.0 1000/mm3       RBC  3.11 (L)  3.60 - 5.20 M/uL       HGB  10.1 (L)  13.0 - 17.2 gm/dl       HCT  32.0 (L)  37.0 - 50.0 %       MCV  102.9 (H)  80.0 - 98.0 fL       MCH  32.5  25.4 - 34.6 pg       MCHC  31.6  30.0 - 36.0 gm/dl       PLATELET  196  140 - 450 1000/mm3       MPV  11.7 (H)  6.0 - 10.0 fL       RDW-SD  55.5 (H)  36.4 - 46.3         NRBC  0  0 - 0         IMMATURE GRANULOCYTES  0.2  0.0 - 3.0 %       NEUTROPHILS  40.7  34 - 64 %       LYMPHOCYTES  48.0  28 - 48 %       MONOCYTES  9.2  1 - 13 %       EOSINOPHILS  1.7  0 - 5 %  BASOPHILS  0.2  0 - 3 %       METABOLIC PANEL, COMPREHENSIVE          Collection Time: 08/31/18 12:56 AM         Result  Value  Ref Range            Sodium  140  136 - 145 mEq/L       Potassium  4.1  3.5 - 5.1 mEq/L       Chloride  111 (H)  98 - 107 mEq/L       CO2  26  21 - 32 mEq/L       Glucose  91  74 - 106 mg/dl       BUN  2 (L)  7 - 25 mg/dl       Creatinine  0.7  0.6 - 1.3 mg/dl       GFR est AA  >60.0          GFR est non-AA  >60          Calcium  7.9 (L)  8.5 - 10.1 mg/dl       AST (SGOT)  68 (H)  15 - 37 U/L            ALT (SGPT)  37  12 - 78 U/L            Alk. phosphatase  133 (H)  45 - 117 U/L       Bilirubin, total  0.3  0.2 - 1.0 mg/dl       Protein, total  5.8 (L)  6.4 - 8.2 gm/dl       Albumin  2.3 (L)  3.4 - 5.0 gm/dl            Anion gap  4 (L)  5 - 15 mmol/L              XR Results:     Results from Hospital Encounter encounter on 08/28/18     XR CHEST SNGL V           Narrative  Indication: chest pain.    .              Impression  IMPRESSION: Portable AP upright view the chest exposed at 1:44 PM August 28, 2018   reveals the lungs are clear and the heart is of normal size.Marland Kitchen  No change from   March 19, 2017.              CT Results:     Results from Hospital Encounter encounter on 08/28/18     CT ABD PELV W CONT           Narrative  EXAMINATION: CT ABD PELV W CONT      CLINICAL  INDICATION: Upper abdominal pain           COMPARISON:  April 25, 2018      TECHNIQUE:  All CT exams at this facility use one or more dose reduction   techniques including automatic exposure control, ma/kV  adjustment per patient's   size, or iterative reconstruction technique. DICOM format image data available   to non-affiliated external healthcare facilities or entities on a secure, media   free, reciprocal searchable basis with patient authorization for 12 months   following the date of the study.      Multiple contiguous axial CT images at 5  mm increments were obtained from the   bases of  the lungs to the  pubic symphysis with intravenous contrast.      FINDINGS:      Visualized lungs/chest: Unremarkable. Gallbladder normal. No intrahepatic   biliary duct dilatation.      Hepatobiliary: Unremarkable. Gallbladder normal.      Kidneys/ureters: Unremarkable.      Stomach: Postop changes about the stomach. Inflammation within the duodenal   sweep.      Adrenal glands: Unremarkable.      Pancreas: Pancreatic head and uncinate process indistinct with adjacent   inflammation. Pancreatic body and tail normal. No pancreatic duct dilatation.   Previous pancreatic head pseudocyst has resolved.      Spleen: Unremarkable.      Vascular structures:  Unremarkable.      Peritoneum/mesentery/bowel:  Unremarkable.      Retroperitoneal lymph nodes: Unremarkable      Pelvis:  Left adnexal cystic lesion 36 x 29 mm, new. Appendix normal           Bones: No significant lesions.      Miscellaneous: None.              Impression  IMPRESSION:       1. Pancreatitis   2. Resolution of pancreatic head pseudocyst.   3. Left adnexal cystic lesion, likely ovarian cyst. Recommend follow-up pelvic   ultrasound in 6 weeks to ensure resolution.              MRI Results:     Results from Hospital Encounter encounter on 04/03/18     MRI ABD W WO CONT           Narrative  MRI ABDOMEN WITH AND WITHOUT CONTRAST      CLINICAL HISTORY: Right upper  quadrant pain      COMPARISON: CT dated 04/03/2018      TECHNIQUE: Multiplanar, multisequence imaging of the upper abdomen was performed   before and after administration of intravenous contrast.      FINDINGS:      Subsegmental atelectasis left greater than right lung base.      2.7 cm focal lesion within the left hepatic lobe, adjacent to the falciform   ligament is consistent with focal hepatic steatosis. This region manifested as   signal dropout on the out of phase sequence. Liver, otherwise grossly   unremarkable. Gallbladder, spleen, adrenal glands and kidneys grossly   unremarkable. Gastric bypass surgery. Inflammatory change adjacent to the   pancreatic head and uncinate process. There is also thickening of the distal   second and proximal third portions of the duodenum. Adjacent to the junction of   the second and third portions of the duodenum, there is a peripherally enhancing   8.5 x 4.6 x 2.8 cm (ML by CC by AP) fluid collection. The fluid collection is   intimate with the inferior margin of the duodenum within this region, and   extends to the right anterior perirenal fascia. This is concerning for a   developing abscess. This may be related to a contained, ruptured ulcer. Other   differential considerations may include pancreatic pseudocyst with possible   superimposed inflammation of the pancreatic head/uncinate process.              Impression  IMPRESSION:   1. Lesion within the liver visualized on previous CT is most consistent with   focal fatty infiltration. No aggressive process.   2. Prominent peripherally enhancing fluid collection adjacent to the pancreatic   head/uncinate process with extensive surrounding inflammatory  change as   described. This is concerning for a developing abscess. This may be related to a   contained, ruptured ulcer. Other differential considerations may include   pancreatic pseudocyst with possible superimposed inflammation of the pancreatic   head/uncinate process.    3. Subsegmental atelectasis within the lung bases.              Nuclear Medicine Results:   No results found for this or any previous visit.      [place] Results:     Results from [place] encounter on 08/28/18     [place] RUQ           Narrative  History: RUQ pain.                  Impression  Impression: Fatty liver. Small gallbladder polyp. Stable peripancreatic head 3   cm fluid collection.      Comment: Sonography of the right upper quadrant was performed. This was compared   with CT abdomen and pelvis April 25, 2018 and right upper quadrant ultrasound   April 08, 2018.      The liver is of mild increased echotexture from fatty infiltration. The intra   and extrahepatic ducts are of normal caliber. The common bile duct measures 4   millimeters. A 6 mm polyp is noted adherent to the gallbladder wall. Otherwise   the wall is of normal thickness. Sonographic Percell Miller sign is negative. No   definite gallstones. No pericholecystic fluid. A 3.1 x 2.1 cm fluid collection   is again noted anterior to the head of the pancreas unchanged from February   2020. tThe right kidney is unremarkable..              IR Results:   No results found for this or any previous visit.      VAS/US Results:     Results from Hospital Encounter encounter on 01/20/18     DUPLEX LOWER EXT VENOUS RIGHT           Narrative                                                                Study ID:    [id number]                                                      [facility]  [place],                                           [place]                                        Lower Extremity Venous Report      Name:  FINNLEE, GUARNIERI Date: 01/20/2018 08:00 PM   MRN: [id number]                      Patient Location: [id number]   DOB: 31-Oct-1985                  Age: 15 yrs   Gender: Female                   Account #: [id number]   Reason For Study: Right leg pain   Ordering Physician: Johnella Moloney      Performed By: Lowella Dell, RVS      Interpretation Summary   No evidence of superficial or deep vein thrombosis noted in the right lower   extremity.   No evidence of deep venous thrombosis in the contralateral/left common    femoral   vein.   _____________________________________________________________________________   __         QUALITY/PROCEDURE   Limited unilateral venous duplex performed of the right leg. Quality of the   study is good. M79.604.      HISTORY/SYMPTOMS   Right leg pain.      RIGHT LEG   The common femoral, femoral, popliteal, posterior tibial and peroneal veins   were examined with duplex ultrasound. The deep veins were patent and   compressible with no evidence of intraluminal thrombus. Spontaneous, phasic   venous flow with normal augmentation noted throughout the right leg.      RIGHT SAPHENOUS VEINS   Spectral Doppler exam demonstrates normal venous flow in the right great   saphenous vein. Compression of the right great saphenous vein is complete.   Filling defects in the right great saphenous vein are absent.         LEFT LEG   Spectral Doppler exam demonstrates normal venous flow in the left common   femoral vein. Compression  of the left common femoral vein is complete.    Filling   defects in the left common femoral vein are absent.         Electronically signed byDR Wenda Low, MD   01/21/2018 01:10 AM              Total discharge time 45 minutes.         Perfecto Kingdom, MD   [facility]   August 31, 2018   2:34 PM

## 2018-08-31 NOTE — Progress Notes (Signed)
Problem: Pain  Goal: *Control of Pain  Outcome: Progressing Towards Goal  Goal: *PALLIATIVE CARE:  Alleviation of Pain  Outcome: Progressing Towards Goal     Problem: Falls - Risk of  Goal: *Absence of Falls  Description: Document Schmid Fall Risk and appropriate interventions in the flowsheet.  Outcome: Progressing Towards Goal  Note: Fall Risk Interventions:            Medication Interventions: Bed/chair exit alarm, Patient to call before getting OOB, Teach patient to arise slowly    Elimination Interventions: Bed/chair exit alarm, Call light in reach, Patient to call for help with toileting needs              Problem: Patient Education: Go to Patient Education Activity  Goal: Patient/Family Education  Outcome: Progressing Towards Goal     Problem: Pancreatitis  Goal: *Control of acute pain  Outcome: Progressing Towards Goal  Goal: *Absence of nausea/vomiting  Outcome: Progressing Towards Goal  Goal: *Optimize nutritional status  Outcome: Progressing Towards Goal  Goal: *Labs within defined limits  Outcome: Progressing Towards Goal     Problem: Risk for Spread of Infection  Goal: Prevent transmission of infectious organism to others  Description: Prevent the transmission of infectious organisms to other patients, staff members, and visitors.  Outcome: Progressing Towards Goal     Problem: Patient Education:  Go to Education Activity  Goal: Patient/Family Education  Outcome: Progressing Towards Goal

## 2018-08-31 NOTE — Progress Notes (Signed)
Discharge Date: 08/31/2018    Discharge Location: Home    Discharge Needs: None    Transportation: Family will transport home     Communication with: Patient, RN, MD

## 2018-08-31 NOTE — Progress Notes (Addendum)
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  Normal gallbladder.  Inflammation within the duodenal sweep.  Postop changes about the stomach.  Pancreatic head with use in a process in the setting with adjacent inflammation.  Pancreatic body and tail normal.  No pancreatic ductal dilatation.  Previous pancreatic head pseudocyst has resolved.  Spleen unremarkable.  Peritoneum/mesentery/bowel unremarkable.  Retroperitoneal lymph nodes unremarkable.              -[place] 08/28/2018 reviewed with Radiologist.  No evidence of fluid collection of pseudocyst identified on subsequent review of imaging with comparison. Small gallbladder polyp noted and fatty infiltration of the liver. CBD 4 mm. Intra-and extrahepatic ducts normal. No definite gallstones.  --Hx of possible sealed off duodenal ulcer perforation with possible abscess   --Elevated AST and alk phos suspect secondary to EtOH use.    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   --Diarrhea-Resolved. Ddx: infectious source, pancreatic insufficiency or other etiology  --Roux-en-Y Gastric Bypass  --Family hx??of Ulcerative??Colitis-Mother  --Family hx Colon??Cancer-Maternal??Grandfather    PLAN:     --Monitor LFTs, CBC, BMP, periodic lipase  --Continue Protonix 59m BID   --Stool studies to include stool for C. Diff and pancreatic elastase-NOT obtained d/t No BMs   --Advance to low fat low cholesterol diet as tolerated   --Monitor abdominal exam, Judicious pain management  --No further GI recommendations or interventions planned at this time   --Continue thiamine, folic acid and MVI  --Once discharged, pt will need to follow up as an outpt with Dr. TDelma Freezein [facility]    Will sign off for now and be available as needed     SUBJECTIVE:  Pt reports some improvement in her abdominal pain. She denies SOB, chest pain, nausea or vomiting. Tolerating diet     OBJECTIVE:  Patient Vitals for the past 24 hrs:   Temp Pulse Resp BP SpO2   08/31/18 0849 98.3 ??F (36.8 ??C) 64 18 109/62 100 %   08/31/18 0517 97.9 ??F (36.6 ??C) 75 18 113/66 100 %   08/30/18 2342 ??? 83 18 109/64 100 %   08/30/18 2052 98.5 ??F (36.9 ??C) 61 20 105/66 100 %   08/30/18 1529 98.3 ??F (36.8 ??C) 65 16 102/82 96 %   08/30/18 1118 98.2 ??F (36.8 ??C) 63 18 97/58 98 %     Physical Exam:  GENERAL: alert, pleasant, cooperative, no distress, appears stated age  EYE: No scleral icterus noted   LUNG: clear to auscultation bilaterally  HEART: S1, S2   ABDOMEN: soft, mildly tender more prominent to epigastric area, bowel sounds active     Labs: Results:   Chemistry Recent Labs     08/31/18  0056 08/30/18  0508 08/29/18  0235   GLU 91 87 92   NA 140 138 136   K 4.1 3.6 3.6   CL 111* 110*  108*   CO2 26 23 24    BUN 2* 3* 7   CREA 0.7 0.7 0.8   CA 7.9* 7.8* 7.9*   AGAP 4* 5 5   AP 133* 128* 154*   TP 5.8* 5.7* 6.3*   ALB 2.3* 2.3* 2.6*    Estimated Creatinine Clearance: 109.4 mL/min (based on SCr of 0.7 mg/dL).   CBC w/Diff Recent Labs     08/31/18  0056 08/30/18  0846 08/29/18  0235   WBC 5.2 4.3 7.5   RBC 3.11* 3.60 3.56*   HGB 10.1* 11.7* 11.6*   HCT 32.0* 37.1 35.8*   PLT 196 203 206   GRANS 40.7 45.9 71.0*   LYMPH 48.0 43.1 21.6*   EOS 1.7 2.5 1.1      Cardiac Enzymes No results for input(s): CPK, CKND1, MYO in the last 72 hours.    No lab exists for component: CKRMB, TROIP   Coagulation No results for input(s): PTP, INR, APTT, INREXT, INREXT in the last 72 hours.    Hepatitis Panel Lab Results   Component Value Date/Time    Hep B surface Ag screen Negative 12/20/2010 12:00 AM     Recent Labs     08/31/18   0056 08/30/18  0508 08/29/18  0235   ALT 37 35 47   TBILI 0.3 0.6 1.0   AP 133* 128* 154*   ALB 2.3* 2.3* 2.6*   TP 5.8* 5.7* 6.3*         Amylase Lipase    Liver Enzymes Recent Labs     08/31/18  0056   TP 5.8*   ALB 2.3*   AP 133*   ALT 37      Thyroid Studies No results for input(s): T4, T3U, TSH, TSHEXT, TSHEXT in the last 72 hours.    No lab exists for component: T3RU     Pathology pathology     Diagnostic Imaging:  No results found.    Allergies   Allergen Reactions   ??? Aspirin Other (comments)     Stomach ulcer   ??? Ibuprofen Unknown (comments)     Gastric Bypass--advised to avoid   ??? Nsaids (Non-Steroidal Anti-Inflammatory Drug) Other (comments)     Hx gastric bypass     Current Facility-Administered Medications   Medication Dose Route Frequency   ??? ondansetron (ZOFRAN) injection 4 mg  4 mg IntraVENous Q6H PRN   ??? cefTRIAXone (ROCEPHIN) 1 g in 0.9% sodium chloride (MBP/ADV) 50 mL MBP  1 g IntraVENous Q24H   ??? dextrose 5% and 0.9% NaCl infusion  100 mL/hr IntraVENous CONTINUOUS   ??? morphine injection 2 mg  2 mg IntraVENous Q4H PRN   ??? promethazine (PHENERGAN) 12.5 mg in NS 50 mL IVPB  12.5 mg IntraVENous Q6H PRN   ??? alum-mag hydroxide-simeth (MYLANTA) oral suspension 30 mL  30 mL Oral Q4H PRN   ??? pantoprazole (PROTONIX) 40 mg in 0.9% sodium chloride 10 mL injection  40 mg IntraVENous Q12H   ??? thiamine HCL (B-1) tablet 100 mg  100 mg Oral DAILY   ??? folic acid (FOLVITE) tablet 1 mg  1 mg Oral DAILY   ??? therapeutic multivitamin-minerals (THERAGRAN-M) tablet 1 Tab  1 Tab Oral DAILY   ??? sodium chloride (NS) flush 5-10 mL  5-10 mL IntraVENous Q8H   ??? sodium chloride (NS) flush 5-10 mL  5-10 mL IntraVENous PRN   ??? naloxone (NARCAN) injection 0.1 mg  0.1 mg IntraVENous PRN   ??? acetaminophen (  TYLENOL) tablet 650 mg  650 mg Oral Q6H PRN   ??? HYDROcodone-acetaminophen (NORCO) 5-325 mg per tablet 1 Tab  1 Tab Oral Q4H PRN   ??? albuterol (PROVENTIL VENTOLIN) nebulizer solution 2.5 mg  2.5 mg Nebulization Q6H PRN    ??? heparin (porcine) injection 5,000 Units  5,000 Units SubCUTAneous Q8H     Active Problems:    Acute pancreatitis (08/28/2018)      Chantel Dixon MS, APRN, FNP-C  Office: [phone]  August 31, 2018    I was personally available for consultation. I have reviewed patient's record and agree with the documentation recorded by the Physician Asst/Nurse Practitioner, including the assessment, treatment plan, and disposition.    Sloan Leiter, MD  August 31, 2018

## 2018-08-31 NOTE — Other (Signed)
Bedside and Verbal shift change report given to Napre T Hayag, RN   (oncoming nurse) by Rachel Hutson (offgoing nurse). Report included the following information SBAR, Kardex, Intake/Output, MAR and Recent Results.

## 2018-08-31 NOTE — Other (Addendum)
----------  DocumentID: [id number]------------------------------------------------              [facility]                       Patient Education Report         Name: Ashley [name], Ashley [name]                  Date: 08/28/2018    MRN: [id number]                    Time: 10:02:10 PM         Patient ordered video: 'Patient Safety: Stay Safe While you are in the Hospital'    from [id number] via phone number: [phone] at 10:02:10 PM    Description: This program outlines some of the precautions patients can take to ensure a speedy recovery without extra complications. The video emphasizes the importance of communicating with the healthcare team.    ----------DocumentID: [id number]------------------------------------------------                       [facility]          Patient Education Report - Discharge Summary        Date: 08/31/2018   Time: 5:21:22 PM   Name: Ashley [name], Ashley [name]   MRN: [id number]      Account Number: [id number]      Education History:        Patient ordered video: 'Patient Safety: Stay Safe While you are in the Hospital' from [id number] on 08/28/2018 10:02:10 PM

## 2018-08-31 NOTE — Progress Notes (Signed)
Problem: Pain  Goal: *Control of Pain  Outcome: Progressing Towards Goal  Goal: *PALLIATIVE CARE:  Alleviation of Pain  Outcome: Progressing Towards Goal     Problem: Falls - Risk of  Goal: *Absence of Falls  Description: Document Schmid Fall Risk and appropriate interventions in the flowsheet.  Outcome: Progressing Towards Goal  Note: Fall Risk Interventions:            Medication Interventions: Bed/chair exit alarm, Patient to call before getting OOB, Teach patient to arise slowly    Elimination Interventions: Bed/chair exit alarm, Call light in reach, Patient to call for help with toileting needs              Problem: Patient Education: Go to Patient Education Activity  Goal: Patient/Family Education  Outcome: Progressing Towards Goal     Problem: Pancreatitis  Goal: *Control of acute pain  Outcome: Progressing Towards Goal  Goal: *Absence of nausea/vomiting  Outcome: Progressing Towards Goal  Goal: *Optimize nutritional status  Outcome: Progressing Towards Goal  Goal: *Labs within defined limits  Outcome: Progressing Towards Goal     Problem: Risk for Spread of Infection  Goal: Prevent transmission of infectious organism to others  Description: Prevent the transmission of infectious organisms to other patients, staff members, and visitors.  Outcome: Progressing Towards Goal     Problem: Patient Education:  Go to Education Activity  Goal: Patient/Family Education  Outcome: Progressing Towards Goal

## 2018-08-31 NOTE — Progress Notes (Signed)
Discharge Date: 08/31/2018    Discharge Location: Home    Discharge Needs: None    Transportation: Family will transport home     Communication with: Patient, RN, MD

## 2018-08-31 NOTE — Discharge Summary (Signed)
Discharge Summary           Patient ID:  Ashley [name], 33 y.o., female  DOB: 11-22-1985    Admit Date: 08/28/2018  Discharge Date:  08/31/2018  Length of stay: 3 day(s)    PCP:  Roosevelt Locks Da, MD    Chief Complaint   Patient presents with   ??? Abdominal Pain   ??? Shortness of Breath       Hospital Problems  Date Reviewed: 04/12/2018          Codes Class Noted POA    Acute pancreatitis ICD-10-CM: K85.90  ICD-9-CM: 010.9  08/28/2018 Unknown              Discharge Diagnosis:     1. RUQ and epigastric pain - could be from pancreatitis or gastritis. Pain has resolved  2. Acute pancreatitis - suspect due to alcohol use  3. Elevated LFT - could be from alcohol use  4. Diarrhea for 2 weeks prior to admission, no recent antibiotic use  5. Small gall bladder polyp  6. Hospitalization in Jan 2020 for peripancreatic cyst/ abscess - presumed to be due to ruptured duodenal ulcer. Treated with antibiotics  7. 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 Obesity BMI 36  10. Daily alcohol use  11. Left adnexal cyst - likely ovarian cyst - outpatient follow up    Hospital course  Ashley [name] was admitted to medical floor  For pancreatitis she was started on supportive care, bowel rest, pain management.    RUQ ultrasound - Fatty liver. Small gallbladder polyp. Stable peripancreatic head 3 cm fluid collection.  However, CT abdomen - . Pancreatitis ;Resolution of pancreatic head pseudocyst  ??  After last hospitalization in Jan 2020, patient was supposed to follow up with general sugery Dr. Laurance Flatten to schedule lap cholecystectomy and GI Dr. Delma Freeze. But she could not follow up    General surgery Dr. Jonathon Jordan was consulted. Recommends outpatient follow up for scheduling cholecystectomy    GI was consulted. Recommend to continue PPI BID.    Patient is recovering well with conservative treatment. Her abdominal pain has resolved. Lipase is normal. Acute hepatitis panel is negative. She has  been strongly counseled to quit drinking alcohol. She is tolerating low fat diet. She has had no diarrhea during hospitalization.  Medically stable for discharge on 08/31/2018          Discharge instructions:    1. Check CBC CMP in one week - script provided to patient. She should follow up with her primary physician  2. Discussed with patient multiple times to maintain follow up with general surgery for cholecystectomy in view of gall bladder polyp  3. Will need close follow up with GI  4. No NSAIDS    Follow-up:  PCP Dr. Roosevelt Locks - one week  GI Dr. Delma Freeze - 2-3 weeks  General surgery Dr. Jonathon Jordan - 2-3 weeks      HPI on Admission (per admitting physician Dr. Winona Legato):  Ashley [name] is a 33 y.o. BLACK [place] female who presents with shortness of breath and chest pain  Patient has history of duodenal ulcer and perforation 5 months ago, gastric bypass has been having upper abdominal pain similar to when she was admitted to the hospital in Jan  She was under the impression that she had pancreatitis, has had a fluid collection inferior to the pancreatic head which was likely considered to be  pancreatic pseudocyst but later was felt to be from a ruptured ulcer with abscess and was seen by infectious disease, gastroenterology  Patient describes some chills but not had high-grade fever      Condition at discharge:  stable    Disposition: home    Code status : full code        Consultants:    1. Gastroenterology  2. General surgery    Physical Exam on Discharge:  Visit Vitals  BP 105/62   Pulse (!) 115   Temp 98 ??F (36.7 ??C)   Resp 16   Ht 5' (1.524 m)   Wt 84.8 kg (187 lb)   SpO2 100%   BMI 36.52 kg/m??     Gen - AAOx3, NAD  HEENT -  mucous membranes moist  Neck - supple, no JVD  Cardiac - RRR, S1, S2, no murmurs  Chest/Lungs - clear to auscultation without wheezes or rhonchi  Abdomen - soft, nontender, non distended, normal bowel sounds  Extremities - no clubbing/ cyanosis/ edema   Neuro - CN 2-12 intact. No motor or sensory deficit.  Skin - no rashes or lesions    Discharge medications :  Current Discharge Medication List      START taking these medications    Details   omeprazole (PRILOSEC) 20 mg capsule Take 1 Cap by mouth two (2) times a day. Please open capsule and take granules with apple sauce  Qty: 60 Cap, Refills: 1      folic acid (FOLVITE) 1 mg tablet Take 1 Tab by mouth daily.  Qty: 30 Tab, Refills: 0      thiamine HCL (B-1) 100 mg tablet Take 1 Tab by mouth daily.  Qty: 30 Tab, Refills: 0      cephALEXin (Keflex) 500 mg capsule Take 1 Cap by mouth three (3) times daily.  Qty: 6 Cap, Refills: 0         CONTINUE these medications which have CHANGED    Details   ondansetron (Zofran ODT) 4 mg disintegrating tablet Take 1 Tab by mouth every eight (8) hours as needed for Nausea.  Qty: 20 Tab, Refills: 0         CONTINUE these medications which have NOT CHANGED    Details   ferrous sulfate 325 mg (65 mg iron) tablet Take 1 Tab by mouth daily (with breakfast). Indications: anemia from inadequate iron  Qty: 30 Tab, Refills: 0      acetaminophen (TYLENOL) 325 mg tablet Take 2 Tabs by mouth every six (6) hours as needed for Pain.  Qty: 20 Tab, Refills: 0                 Most Recent Labs:  Recent Results (from the past 24 hour(s))   CBC WITH AUTOMATED DIFF    Collection Time: 08/31/18 12:56 AM   Result Value Ref Range    WBC 5.2 4.0 - 11.0 1000/mm3    RBC 3.11 (L) 3.60 - 5.20 M/uL    HGB 10.1 (L) 13.0 - 17.2 gm/dl    HCT 32.0 (L) 37.0 - 50.0 %    MCV 102.9 (H) 80.0 - 98.0 fL    MCH 32.5 25.4 - 34.6 pg    MCHC 31.6 30.0 - 36.0 gm/dl    PLATELET 196 140 - 450 1000/mm3    MPV 11.7 (H) 6.0 - 10.0 fL    RDW-SD 55.5 (H) 36.4 - 46.3      NRBC 0 0 - 0  IMMATURE GRANULOCYTES 0.2 0.0 - 3.0 %    NEUTROPHILS 40.7 34 - 64 %    LYMPHOCYTES 48.0 28 - 48 %    MONOCYTES 9.2 1 - 13 %    EOSINOPHILS 1.7 0 - 5 %    BASOPHILS 0.2 0 - 3 %   METABOLIC PANEL, COMPREHENSIVE    Collection Time: 08/31/18 12:56 AM    Result Value Ref Range    Sodium 140 136 - 145 mEq/L    Potassium 4.1 3.5 - 5.1 mEq/L    Chloride 111 (H) 98 - 107 mEq/L    CO2 26 21 - 32 mEq/L    Glucose 91 74 - 106 mg/dl    BUN 2 (L) 7 - 25 mg/dl    Creatinine 0.7 0.6 - 1.3 mg/dl    GFR est AA >60.0      GFR est non-AA >60      Calcium 7.9 (L) 8.5 - 10.1 mg/dl    AST (SGOT) 68 (H) 15 - 37 U/L    ALT (SGPT) 37 12 - 78 U/L    Alk. phosphatase 133 (H) 45 - 117 U/L    Bilirubin, total 0.3 0.2 - 1.0 mg/dl    Protein, total 5.8 (L) 6.4 - 8.2 gm/dl    Albumin 2.3 (L) 3.4 - 5.0 gm/dl    Anion gap 4 (L) 5 - 15 mmol/L         XR Results:  Results from Hospital Encounter encounter on 08/28/18   XR CHEST SNGL V    Narrative Indication: chest pain.    .      Impression IMPRESSION: Portable AP upright view the chest exposed at 1:44 PM August 28, 2018  reveals the lungs are clear and the heart is of normal size.Marland Kitchen  No change from  March 19, 2017.         CT Results:  Results from Hospital Encounter encounter on 08/28/18   CT ABD PELV W CONT    Narrative EXAMINATION: CT ABD PELV W CONT    CLINICAL INDICATION: Upper abdominal pain         COMPARISON:  April 25, 2018    TECHNIQUE:  All CT exams at this facility use one or more dose reduction  techniques including automatic exposure control, ma/kV  adjustment per patient's  size, or iterative reconstruction technique. DICOM format image data available  to non-affiliated external healthcare facilities or entities on a secure, media  free, reciprocal searchable basis with patient authorization for 12 months  following the date of the study.    Multiple contiguous axial CT images at 5  mm increments were obtained from the  bases of the lungs to the  pubic symphysis with intravenous contrast.    FINDINGS:    Visualized lungs/chest: Unremarkable. Gallbladder normal. No intrahepatic  biliary duct dilatation.    Hepatobiliary: Unremarkable. Gallbladder normal.    Kidneys/ureters: Unremarkable.     Stomach: Postop changes about the stomach. Inflammation within the duodenal  sweep.    Adrenal glands: Unremarkable.    Pancreas: Pancreatic head and uncinate process indistinct with adjacent  inflammation. Pancreatic body and tail normal. No pancreatic duct dilatation.  Previous pancreatic head pseudocyst has resolved.    Spleen: Unremarkable.    Vascular structures:  Unremarkable.    Peritoneum/mesentery/bowel:  Unremarkable.    Retroperitoneal lymph nodes: Unremarkable    Pelvis:  Left adnexal cystic lesion 36 x 29 mm, new. Appendix normal  Bones: No significant lesions.    Miscellaneous: None.      Impression IMPRESSION:     1. Pancreatitis  2. Resolution of pancreatic head pseudocyst.  3. Left adnexal cystic lesion, likely ovarian cyst. Recommend follow-up pelvic  ultrasound in 6 weeks to ensure resolution.         MRI Results:  Results from Hospital Encounter encounter on 04/03/18   MRI ABD W WO CONT    Narrative MRI ABDOMEN WITH AND WITHOUT CONTRAST    CLINICAL HISTORY: Right upper quadrant pain    COMPARISON: CT dated 04/03/2018    TECHNIQUE: Multiplanar, multisequence imaging of the upper abdomen was performed  before and after administration of intravenous contrast.    FINDINGS:    Subsegmental atelectasis left greater than right lung base.    2.7 cm focal lesion within the left hepatic lobe, adjacent to the falciform  ligament is consistent with focal hepatic steatosis. This region manifested as  signal dropout on the out of phase sequence. Liver, otherwise grossly  unremarkable. Gallbladder, spleen, adrenal glands and kidneys grossly  unremarkable. Gastric bypass surgery. Inflammatory change adjacent to the  pancreatic head and uncinate process. There is also thickening of the distal  second and proximal third portions of the duodenum. Adjacent to the junction of  the second and third portions of the duodenum, there is a peripherally enhancing   8.5 x 4.6 x 2.8 cm (ML by CC by AP) fluid collection. The fluid collection is  intimate with the inferior margin of the duodenum within this region, and  extends to the right anterior perirenal fascia. This is concerning for a  developing abscess. This may be related to a contained, ruptured ulcer. Other  differential considerations may include pancreatic pseudocyst with possible  superimposed inflammation of the pancreatic head/uncinate process.      Impression IMPRESSION:  1. Lesion within the liver visualized on previous CT is most consistent with  focal fatty infiltration. No aggressive process.  2. Prominent peripherally enhancing fluid collection adjacent to the pancreatic  head/uncinate process with extensive surrounding inflammatory change as  described. This is concerning for a developing abscess. This may be related to a  contained, ruptured ulcer. Other differential considerations may include  pancreatic pseudocyst with possible superimposed inflammation of the pancreatic  head/uncinate process.  3. Subsegmental atelectasis within the lung bases.         Nuclear Medicine Results:  No results found for this or any previous visit.    [place] Results:  Results from [place] encounter on 08/28/18   [place] RUQ    Narrative History: RUQ pain.          Impression Impression: Fatty liver. Small gallbladder polyp. Stable peripancreatic head 3  cm fluid collection.    Comment: Sonography of the right upper quadrant was performed. This was compared  with CT abdomen and pelvis April 25, 2018 and right upper quadrant ultrasound  April 08, 2018.    The liver is of mild increased echotexture from fatty infiltration. The intra  and extrahepatic ducts are of normal caliber. The common bile duct measures 4  millimeters. A 6 mm polyp is noted adherent to the gallbladder wall. Otherwise  the wall is of normal thickness. Sonographic Percell Miller sign is negative. No   definite gallstones. No pericholecystic fluid. A 3.1 x 2.1 cm fluid collection  is again noted anterior to the head of the pancreas unchanged from February  2020. tThe right kidney is unremarkable.Marland Kitchen  IR Results:  No results found for this or any previous visit.    VAS/US Results:  Results from Hospital Encounter encounter on 01/20/18   DUPLEX LOWER EXT VENOUS RIGHT    Narrative                                                               Study ID:   [id number]                                                 [facility]                                            [address] [place],                                          [place]                                   Lower Extremity Venous Report    Name: KATLEN, SEYER Date: 01/20/2018 08:00 PM  MRN: [id number]                      Patient Location: FAO^[id number]^[id number]  DOB: 08-03-1985                  Age: 16 yrs  Gender: Female  Account #: [id number]  Reason For Study: Right leg pain  Ordering Physician: Johnella Moloney    Performed By: Lowella Dell, RVS    Interpretation Summary  No evidence of superficial or deep vein thrombosis noted in the right lower  extremity.  No evidence of deep venous thrombosis in the contralateral/left common   femoral  vein.  _____________________________________________________________________________  __      QUALITY/PROCEDURE  Limited unilateral venous duplex performed of the right leg. Quality of the  study is good. M79.604.    HISTORY/SYMPTOMS  Right leg pain.    RIGHT LEG  The common femoral, femoral, popliteal, posterior tibial and peroneal veins  were examined with duplex ultrasound. The deep veins were patent and   compressible with no evidence of intraluminal thrombus. Spontaneous, phasic  venous flow with normal augmentation noted throughout the right leg.    RIGHT SAPHENOUS VEINS  Spectral Doppler exam demonstrates normal venous flow in the right great  saphenous vein. Compression of the right great saphenous vein is complete.  Filling defects in the right great saphenous vein are absent.      LEFT LEG  Spectral Doppler exam demonstrates normal venous flow in the left common  femoral vein. Compression of the left common femoral vein is complete.   Filling  defects in the left common femoral vein are absent.      Electronically signed byDR Wenda Low, MD   01/21/2018 01:10 AM         Total discharge time 45 minutes.      Perfecto Kingdom, MD  [facility]  August 31, 2018  2:34 PM

## 2018-11-27 ENCOUNTER — Emergency Department: Admit: 2018-11-27 | Payer: MEDICAID | Primary: Family Medicine

## 2018-11-27 ENCOUNTER — Emergency Department: Admit: 2018-11-28 | Payer: MEDICAID | Primary: Family Medicine

## 2018-11-27 ENCOUNTER — Inpatient Hospital Stay
Admit: 2018-11-27 | Discharge: 2018-12-01 | Disposition: A | Discharge status: Home / Self Care | Payer: MEDICAID | Attending: Internal Medicine | Admitting: Internal Medicine

## 2018-11-27 DIAGNOSIS — K852 Alcohol induced acute pancreatitis without necrosis or infection: Secondary | ICD-10-CM

## 2018-11-27 LAB — CBC WITH AUTO DIFFERENTIAL
Basophils %: 0.3 % (ref 0–3)
Eosinophils %: 0.7 % (ref 0–5)
Hematocrit: 38.6 % (ref 37.0–50.0)
Hemoglobin: 13.3 gm/dl (ref 13.0–17.2)
Immature Granulocytes: 0.3 % (ref 0.0–3.0)
Lymphocytes %: 23.3 % — ABNORMAL LOW (ref 28–48)
MCH: 34.7 pg — ABNORMAL HIGH (ref 25.4–34.6)
MCHC: 34.5 gm/dl (ref 30.0–36.0)
MCV: 100.8 fL — ABNORMAL HIGH (ref 80.0–98.0)
MPV: 11.3 fL — ABNORMAL HIGH (ref 6.0–10.0)
Monocytes %: 6.6 % (ref 1–13)
Neutrophils %: 68.8 % — ABNORMAL HIGH (ref 34–64)
Nucleated RBCs: 0 (ref 0–0)
Platelets: 261 10*3/uL (ref 140–450)
RBC: 3.83 M/uL (ref 3.60–5.20)
RDW-SD: 49.7 — ABNORMAL HIGH (ref 36.4–46.3)
WBC: 7.3 10*3/uL (ref 4.0–11.0)

## 2018-11-27 LAB — CBC WITH AUTOMATED DIFF
BASOPHILS: 0.3 % (ref 0–3)
EOSINOPHILS: 0.7 % (ref 0–5)
HCT: 38.6 % (ref 37.0–50.0)
HGB: 13.3 gm/dl (ref 13.0–17.2)
IMMATURE GRANULOCYTES: 0.3 % (ref 0.0–3.0)
LYMPHOCYTES: 23.3 % — ABNORMAL LOW (ref 28–48)
MCH: 34.7 pg — ABNORMAL HIGH (ref 25.4–34.6)
MCHC: 34.5 gm/dl (ref 30.0–36.0)
MCV: 100.8 fL — ABNORMAL HIGH (ref 80.0–98.0)
MONOCYTES: 6.6 % (ref 1–13)
MPV: 11.3 fL — ABNORMAL HIGH (ref 6.0–10.0)
NEUTROPHILS: 68.8 % — ABNORMAL HIGH (ref 34–64)
NRBC: 0 (ref 0–0)
PLATELET: 261 10*3/uL (ref 140–450)
RBC: 3.83 M/uL (ref 3.60–5.20)
RDW-SD: 49.7 — ABNORMAL HIGH (ref 36.4–46.3)
WBC: 7.3 10*3/uL (ref 4.0–11.0)

## 2018-11-27 MED ORDER — ONDANSETRON (PF) 4 MG/2 ML INJECTION
4 mg/2 mL | Freq: Once | INTRAMUSCULAR | Status: AC
Start: 2018-11-27 — End: 2018-11-27
  Administered 2018-11-28: via INTRAVENOUS

## 2018-11-27 MED ORDER — SODIUM CHLORIDE 0.9% BOLUS IV
0.9 % | INTRAVENOUS | Status: AC
Start: 2018-11-27 — End: 2018-11-27
  Administered 2018-11-28: via INTRAVENOUS

## 2018-11-27 MED ORDER — FAMOTIDINE (PF) 20 MG/2 ML IV
20 mg/2 mL | INTRAVENOUS | Status: AC
Start: 2018-11-27 — End: 2018-11-27
  Administered 2018-11-28: via INTRAVENOUS

## 2018-11-27 MED ORDER — MORPHINE 4 MG/ML SYRINGE
4 mg/mL | INTRAMUSCULAR | Status: AC
Start: 2018-11-27 — End: 2018-11-27
  Administered 2018-11-28: via INTRAVENOUS

## 2018-11-27 MED ORDER — DIATRIZOATE MEGLUMINE & SODIUM 66 %-10 % ORAL SOLN
66-10 % | Freq: Once | ORAL | Status: AC
Start: 2018-11-27 — End: 2018-11-27
  Administered 2018-11-28: via ORAL

## 2018-11-27 MED FILL — MORPHINE 4 MG/ML SYRINGE: 4 mg/mL | INTRAMUSCULAR | Qty: 2

## 2018-11-27 MED FILL — GASTROGRAFIN 66 %-10 % ORAL SOLUTION: 66-10 % | ORAL | Qty: 30

## 2018-11-27 MED FILL — FAMOTIDINE (PF) 20 MG/2 ML IV: 20 mg/2 mL | INTRAVENOUS | Qty: 2

## 2018-11-27 MED FILL — ONDANSETRON (PF) 4 MG/2 ML INJECTION: 4 mg/2 mL | INTRAMUSCULAR | Qty: 2

## 2018-11-27 NOTE — H&P (Signed)
Hospitalist Admission History and Physical    NAME:  Ashley [name]   DOB:   1986/02/13   MRN:   [id number]     PCP:  Roosevelt Locks Da, MD  Admission Date/Time:  11/27/2018 11:06 PM  Anticipated Date of Discharge: 11/30/2018  Anticipated Disposition (home, SNF) : HH         Assessment/Plan:      Active Problems:    Acute pancreatitis (08/28/2018)       Acute pancreatitis  Obesity status post Roux-en-Y gastric bypass  History of alcohol abuse       ___________________________________________________  PLAN:    Admit the patient to medical bed  We will give 1 more liter of IV fluid bolus  N.p.o. except ice chips  Trend H&H to make sure patient is getting adequately hydrated and hemoglobin/hematocrit getting diluted  Protonix IV  Pain control, nausea control  Patient has been having dark stools, hemoglobin was okay and Hemoccult done in the emergency room was negative      Risk of deterioration:  []Low    [x]Moderate  []High    Prophylaxis:  []Lovenox  []Coumadin  [x]Hep SQ  []SCD???s  []H2B/PPI[]Eliquis []Xarelto     Disposition:  []Home w/ Family   [x]HH PT,OT,RN   []SNF/LTC   []SAH/Rehab             Subjective:   CHIEF COMPLAINT:    Chief Complaint   Patient presents with   ??? Abdominal Pain       HISTORY OF PRESENT ILLNESS:     Ashley [name] is a 33 y.o. BLACK OR [place] AMER[place] female who presents with worsening abdominal pain  Patient has history of alcohol abuse, has been admitted to the hospital for pancreatitis/gastritis secondary to alcohol abuse states that she has no longer drinking since last admission  She has noticed 3-day worth epigastric pain radiating to the back also has been having some black stools nausea vomiting  He said she has had a toothache last week and has been taking ibuprofen around-the-clock    Past Medical History:   Diagnosis Date   ??? Ill-defined condition     bronchitis   ??? Pneumonia         Past Surgical History:   Procedure Laterality Date   ??? HX CESAREAN SECTION  2002, 2004, 2008    x 3   ??? HX CESAREAN  SECTION     ??? HX GASTRIC BYPASS  10-2009    [facility]   ??? HX GASTRIC BYPASS     ??? HX GASTRIC BYPASS         Social History     Tobacco Use   ??? Smoking status: Former Smoker     Types: Cigarettes     Last attempt to quit: 11/12/2009     Years since quitting: 9.0   ??? Smokeless tobacco: Never Used   Substance Use Topics   ??? Alcohol use: Not Currently     Alcohol/week: 0.0 - 4.0 standard drinks     Comment: occassional        Family History   Problem Relation Age of Onset   ??? Diabetes Mother    ??? Hypertension Father    ??? Heart Disease Maternal Grandmother    ??? Heart Attack Maternal Grandmother    ??? High Cholesterol Maternal Grandmother    ??? Arthritis-osteo Maternal Grandmother    ??? Stroke Paternal Aunt    ??? Kidney Disease Maternal Aunt  Allergies   Allergen Reactions   ??? Aspirin Other (comments)     Stomach ulcer   ??? Ibuprofen Unknown (comments)     Gastric Bypass--advised to avoid   ??? Nsaids (Non-Steroidal Anti-Inflammatory Drug) Other (comments)     Hx gastric bypass        Prior to Admission Medications   Prescriptions Last Dose Informant Patient Reported? Taking?   acetaminophen (TYLENOL) 325 mg tablet   No No   Sig: Take 2 Tabs by mouth every six (6) hours as needed for Pain.   cephALEXin (Keflex) 500 mg capsule   No No   Sig: Take 1 Cap by mouth three (3) times daily.   ferrous sulfate 325 mg (65 mg iron) tablet   No No   Sig: Take 1 Tab by mouth daily (with breakfast). Indications: anemia from inadequate iron   folic acid (FOLVITE) 1 mg tablet   No No   Sig: Take 1 Tab by mouth daily.   omeprazole (PRILOSEC) 20 mg capsule   No No   Sig: Take 1 Cap by mouth two (2) times a day. Please open capsule and take granules with apple sauce   ondansetron (Zofran ODT) 4 mg disintegrating tablet   No No   Sig: Take 1 Tab by mouth every eight (8) hours as needed for Nausea.   thiamine HCL (B-1) 100 mg tablet   No No   Sig: Take 1 Tab by mouth daily.      Facility-Administered Medications: None           REVIEW OF  SYSTEMS:     _0  Unable to obtain  ROS due to  _1 mental status change  _2 sedated   _3 intubated   _4 Total of 12 systems reviewed as follows:  Constitutional: negative fever, negative chills, negative weight loss  Eyes:   negative visual changes  ENT:   negative sore throat, tongue or lip swelling  Respiratory:  negative cough, negative dyspnea  Cards:  negative for chest pain, palpitations, lower extremity edema  GI:   Positive for nausea vomiting diarrhea and abdominal pain  Genitourinary: negative for frequency, dysuria  Integument:  negative for rash and pruritus  Hematologic:  negative for easy bruising and gum/nose bleeding  Musculoskel: negative for myalgias,  back pain and muscle weakness  Neurological:  negative for headaches, dizziness, vertigo  Behavl/Psych: negative for feelings of anxiety, depression       Objective:   VITALS:    Visit Vitals  BP 112/76 (BP 1 Location: Right arm, BP Patient Position: Supine)   Pulse (!) 58   Temp 99.2 ??F (37.3 ??C)   Resp 20   SpO2 100%     Temp (24hrs), Avg:99.2 ??F (37.3 ??C), Min:99.2 ??F (37.3 ??C), Max:99.2 ??F (37.3 ??C)      PHYSICAL EXAM: (Seen with PPE , gloves , gown , mask n95 and goggles )  General:    Alert, cooperative, no distress, appears stated age.     Head:   Normocephalic, without obvious abnormality, atraumatic.  Eyes:   Conjunctivae clear, anicteric sclerae.  Pupils are equal  Nose:  Nares normal. No drainage or sinus tenderness.  Throat:    Lips, mucosa, and tongue normal.  No Thrush  Neck:  Supple, symmetrical,  no adenopathy, thyroid: non tender    no carotid bruit and no JVD.  Back:    Symmetric,  No CVA tenderness.  Lungs:   Clear to auscultation bilaterally.  No Wheezing or Rhonchi. No rales.  Chest wall:  No tenderness or deformity. No Accessory muscle use.  Heart:   Regular rate and rhythm,  no murmur, rub or gallop.  Abdomen:   Tenderness in the epigastric area, overall no guarding rebound no signs of peritonitis pulses present  Extremities: Mild  edema in the lower extremities, weakness which is stable pulses, no sign of ischemia or gangrene  Skin:     Texture, turgor normal. No rashes or lesions.  Not Jaundiced  Psych:  Patient is anxious  Neurologic: EOMs intact. No facial asymmetry. No aphasia or slurred speech. Normal  strength, Alert and oriented X 3.       LAB DATA REVIEWED:    Recent Results (from the past 12 hour(s))   CBC WITH AUTOMATED DIFF    Collection Time: 11/27/18  7:20 PM   Result Value Ref Range    WBC 7.3 4.0 - 11.0 1000/mm3    RBC 3.83 3.60 - 5.20 M/uL    HGB 13.3 13.0 - 17.2 gm/dl    HCT 38.6 37.0 - 50.0 %    MCV 100.8 (H) 80.0 - 98.0 fL    MCH 34.7 (H) 25.4 - 34.6 pg    MCHC 34.5 30.0 - 36.0 gm/dl    PLATELET 261 140 - 450 1000/mm3    MPV 11.3 (H) 6.0 - 10.0 fL    RDW-SD 49.7 (H) 36.4 - 46.3      NRBC 0 0 - 0      IMMATURE GRANULOCYTES 0.3 0.0 - 3.0 %    NEUTROPHILS 68.8 (H) 34 - 64 %    LYMPHOCYTES 23.3 (L) 28 - 48 %    MONOCYTES 6.6 1 - 13 %    EOSINOPHILS 0.7 0 - 5 %    BASOPHILS 0.3 0 - 3 %   METABOLIC PANEL, COMPREHENSIVE    Collection Time: 11/27/18  7:20 PM   Result Value Ref Range    Sodium 139 136 - 145 mEq/L    Potassium 4.1 3.5 - 5.1 mEq/L    Chloride 107 98 - 107 mEq/L    CO2 24 21 - 32 mEq/L    Glucose 90 74 - 106 mg/dl    BUN 6 (L) 7 - 25 mg/dl    Creatinine 0.7 0.6 - 1.3 mg/dl    GFR est AA >60.0      GFR est non-AA >60      Calcium 8.7 8.5 - 10.1 mg/dl    AST (SGOT) 61 (H) 15 - 37 U/L    ALT (SGPT) 72 12 - 78 U/L    Alk. phosphatase 182 (H) 45 - 117 U/L    Bilirubin, total 0.5 0.2 - 1.0 mg/dl    Protein, total 6.9 6.4 - 8.2 gm/dl    Albumin 3.1 (L) 3.4 - 5.0 gm/dl    Anion gap 8 5 - 15 mmol/L   LIPASE    Collection Time: 11/27/18  7:20 PM   Result Value Ref Range    Lipase 487 (H) 73 - 393 U/L   POC HCG,URINE    Collection Time: 11/27/18  8:22 PM   Result Value Ref Range    HCG urine, QL negative NEGATIVE,Negative,negative     POC URINE MACROSCOPIC    Collection Time: 11/27/18  8:37 PM   Result Value Ref Range    Glucose  Negative NEGATIVE,Negative mg/dl    Bilirubin Negative NEGATIVE,Negative      Ketone Negative NEGATIVE,Negative mg/dl    Specific gravity 1.025 1.005 - 1.030  Blood Trace-intact (A) NEGATIVE,Negative      pH (UA) 6.0 5 - 9      Protein Negative NEGATIVE,Negative mg/dl    Urobilinogen 0.2 0.0 - 1.0 EU/dl    Nitrites Positive (A) NEGATIVE,Negative      Leukocyte Esterase Small (A) NEGATIVE,Negative      Color Yellow      Appearance Clear     POC URINE MICROSCOPIC    Collection Time: 11/27/18  8:37 PM   Result Value Ref Range    Epithelial cells, squamous 15-29 /LPF    WBC 10-14 /HPF    RBC OCCASIONAL /HPF    Bacteria 2+ /HPF         IMAGING RESULTS:    Xr Chest Sngl V    Result Date: 11/27/2018  Clinical history: Abdominal pain EXAMINATION: Single view of the chest 11/27/2018 Correlation: 08/28/2018 FINDINGS: Trachea and heart size are within normal limits. Lungs are clear.     IMPRESSION: No acute pulmonary process.     Ct Abd Pelv W Cont    Result Date: 11/27/2018  Clinical history: Abdominal pain, vomiting EXAMINATION: CT scan abdomen and pelvis with intravenous and oral contrast 11/27/2018. 5 mm spiral scanning is performed from the costophrenic angles to the symphysis pubis. Coronal and sagittal reconstruction imaging has been obtained. Correlation: 08/28/2018 FINDINGS: Lung bases are clear. Degenerative changes of the spine. Liver demonstrates fatty infiltration. Gallbladder, spleen, adrenal glands, kidneys, uterus and adnexa are unremarkable. Peripancreatic inflammatory changes are present compatible with pancreatitis. No pancreatic or common bile duct dilatation. Bladder is underdistended. Colon, terminal ileum and small bowel loops are within normal limits. Status post gastric bypass surgery.  Abdominal aorta is within normal limits. No dominant lymph node enlargement or free fluid.     IMPRESSION: 1. Acute pancreatitis. 2. Fatty infiltration of the liver. 3. Gastric bypass surgery.       Care Plan discussed  with:     [x]Patient   []Family    []ED Care Manager  []ED Doc   []Specialist :    Total care time spent on reviewing the case/data/notes/EMR,examining the patient,documentation,coordinating care with nurses and consultants is 40 minutes.       ___________________________________________________  Admitting Physician: Charlaine Dalton, MD     Dragon medical dictation software was used for portions of this report.  Unintended voice transcription errors may have occurred.

## 2018-11-27 NOTE — ED Notes (Signed)
Report to Serena RN

## 2018-11-27 NOTE — ED Provider Notes (Signed)
[facility]  Emergency Department Treatment Report    Patient: Ashley [name] Age: 33 y.o. Sex: female    Date of Birth: 1985/11/03 Admit Date: 11/27/2018 PCP: Roosevelt Locks Da, MD   MRN: [id number]  CSN: [id number]     Room: ER11/ER11 Time Dictated: 7:23 PM        Chief Complaint   Abdominal pain  History of Present Illness   34 y.o. female with history alcohol abuse, and admission to this facility in June 2020 for acute pancreatitis and gastritis secondary to alcohol abuse.  Patient states that she no longer drinking.    She now presents complaining of a 3-day history of epigastric pain, sharp pain which is severe and radiates to her back.  Patient also complains of black stools, nausea and vomiting.    She states that she had a toothache last week, and for several days took ibuprofen around-the-clock.  Review of Systems   Constitutional: No fever, chills, or weight loss  Eyes: No visual symptoms.  ENT: No sore throat, runny nose or ear pain.  Respiratory:  No cough, dyspnea or wheezing.  Cardiovascular: No chest pain, pressure, palpitations, tightness or heaviness.  Gastrointestinal: Positive for nausea, vomiting, diarrhea and abdominal pain.  Genitourinary: No dysuria, frequency, or urgency.  Musculoskeletal: No joint pain or swelling.  Integumentary: No rashes.  Neurological: No headaches, sensory or motor symptoms.  Denies complaints in all other systems.    Past Medical/Surgical History     Past Medical History:   Diagnosis Date   ??? Ill-defined condition     bronchitis   ??? Pneumonia      Past Surgical History:   Procedure Laterality Date   ??? HX CESAREAN SECTION  2002, 2004, 2008    x 3   ??? HX CESAREAN SECTION     ??? HX GASTRIC BYPASS  10-2009    [facility]   ??? HX GASTRIC BYPASS     ??? HX GASTRIC BYPASS         Social History     Social History     Socioeconomic History   ??? Marital status: SINGLE     Spouse name: Not on file   ??? Number of children: Not on file   ??? Years of education: Not on file   ???  Highest education level: Not on file   Occupational History   ??? Not on file   Social Needs   ??? Financial resource strain: Not on file   ??? Food insecurity     Worry: Not on file     Inability: Not on file   ??? Transportation needs     Medical: Not on file     Non-medical: Not on file   Tobacco Use   ??? Smoking status: Former Smoker     Types: Cigarettes     Last attempt to quit: 11/12/2009     Years since quitting: 9.0   ??? Smokeless tobacco: Never Used   Substance and Sexual Activity   ??? Alcohol use: Not Currently     Alcohol/week: 0.0 - 4.0 standard drinks     Comment: occassional   ??? Drug use: No   ??? Sexual activity: Yes     Partners: Male     Birth control/protection: None   Lifestyle   ??? Physical activity     Days per week: Not on file     Minutes per session: Not on file   ??? Stress: Not on file  Relationships   ??? Social Product manager on phone: Not on file     Gets together: Not on file     Attends religious service: Not on file     Active member of club or organization: Not on file     Attends meetings of clubs or organizations: Not on file     Relationship status: Not on file   ??? Intimate partner violence     Fear of current or ex partner: Not on file     Emotionally abused: Not on file     Physically abused: Not on file     Forced sexual activity: Not on file   Other Topics Concern   ??? Not on file   Social History Narrative    ** Merged History Encounter **             Family History     Family History   Problem Relation Age of Onset   ??? Diabetes Mother    ??? Hypertension Father    ??? Heart Disease Maternal Grandmother    ??? Heart Attack Maternal Grandmother    ??? High Cholesterol Maternal Grandmother    ??? Arthritis-osteo Maternal Grandmother    ??? Stroke Paternal Aunt    ??? Kidney Disease Maternal Aunt        Current Medications     Prior to Admission medications    Medication Sig Start Date End Date Taking? Authorizing Provider   ondansetron (Zofran ODT) 4 mg disintegrating tablet Take 1 Tab by mouth every  eight (8) hours as needed for Nausea. 08/31/18   Simoes, Legrand Como, MD   omeprazole (PRILOSEC) 20 mg capsule Take 1 Cap by mouth two (2) times a day. Please open capsule and take granules with apple sauce 08/31/18   Simoes, Ruchita S, MD   folic acid (FOLVITE) 1 mg tablet Take 1 Tab by mouth daily. 09/01/18   Simoes, Ruchita S, MD   thiamine HCL (B-1) 100 mg tablet Take 1 Tab by mouth daily. 09/01/18   Simoes, Ruchita S, MD   cephALEXin (Keflex) 500 mg capsule Take 1 Cap by mouth three (3) times daily. 08/31/18   Simoes, Ruchita S, MD   ferrous sulfate 325 mg (65 mg iron) tablet Take 1 Tab by mouth daily (with breakfast). Indications: anemia from inadequate iron 04/12/18   Marzella Schlein, MD   acetaminophen (TYLENOL) 325 mg tablet Take 2 Tabs by mouth every six (6) hours as needed for Pain. 01/20/18   Burroughs Loffler, PA-C     Allergies     Allergies   Allergen Reactions   ??? Aspirin Other (comments)     Stomach ulcer   ??? Ibuprofen Unknown (comments)     Gastric Bypass--advised to avoid   ??? Nsaids (Non-Steroidal Anti-Inflammatory Drug) Other (comments)     Hx gastric bypass     Physical Exam     ED Triage Vitals   ED Encounter Vitals Group      BP       Pulse       Resp       Temp       Temp src       SpO2       Weight       Height        Constitutional: Morbidly obese woman who is alert oriented and nontoxic-appearing.  HEENT: Conjunctiva clear.  PERRLA. Mucous membranes moist, non-erythematous. Surface of the pharynx,  palate, and tongue are pink, moist and without lesions.  Neck: supple, non tender, symmetrical, no masses or JVD.   Respiratory: lungs clear to auscultation, nonlabored respirations. No tachypnea or accessory muscle use.  Cardiovascular: heart regular rate and rhythm without murmur rubs or gallops.   Calves soft and non-tender.  1+ pitting edema bilateral lower extremities.  Gastrointestinal:  Abdomen soft, obese, epigastric abdominal tenderness, no guarding or rebound are noted.  Rectal: Brown  heme-negative stool.  Musculoskeletal: Nail beds pink with prompt capillary refill  Integumentary: warm and dry without rashes or lesions  Neurologic: alert and oriented, Sensation intact, motor strength equal and symmetric.  No facial asymmetry or dysarthria.        Impression and Management Plan   This is a new problem for this patient.  Patient presents complaining of epigastric dental pain.  Patient had a history of gastric bypass, so we will need to do a oral contrast CT scan to rule out internal hernia.  We will start Pepcid.  Laboratory studies.  Hydrate patient and treat pain.    Diagnostic Studies   Lab:   Recent Results (from the past 12 hour(s))   CBC WITH AUTOMATED DIFF    Collection Time: 11/27/18  7:20 PM   Result Value Ref Range    WBC 7.3 4.0 - 11.0 1000/mm3    RBC 3.83 3.60 - 5.20 M/uL    HGB 13.3 13.0 - 17.2 gm/dl    HCT 38.6 37.0 - 50.0 %    MCV 100.8 (H) 80.0 - 98.0 fL    MCH 34.7 (H) 25.4 - 34.6 pg    MCHC 34.5 30.0 - 36.0 gm/dl    PLATELET 261 140 - 450 1000/mm3    MPV 11.3 (H) 6.0 - 10.0 fL    RDW-SD 49.7 (H) 36.4 - 46.3      NRBC 0 0 - 0      IMMATURE GRANULOCYTES 0.3 0.0 - 3.0 %    NEUTROPHILS 68.8 (H) 34 - 64 %    LYMPHOCYTES 23.3 (L) 28 - 48 %    MONOCYTES 6.6 1 - 13 %    EOSINOPHILS 0.7 0 - 5 %    BASOPHILS 0.3 0 - 3 %   METABOLIC PANEL, COMPREHENSIVE    Collection Time: 11/27/18  7:20 PM   Result Value Ref Range    Sodium 139 136 - 145 mEq/L    Potassium 4.1 3.5 - 5.1 mEq/L    Chloride 107 98 - 107 mEq/L    CO2 24 21 - 32 mEq/L    Glucose 90 74 - 106 mg/dl    BUN 6 (L) 7 - 25 mg/dl    Creatinine 0.7 0.6 - 1.3 mg/dl    GFR est AA >60.0      GFR est non-AA >60      Calcium 8.7 8.5 - 10.1 mg/dl    AST (SGOT) 61 (H) 15 - 37 U/L    ALT (SGPT) 72 12 - 78 U/L    Alk. phosphatase 182 (H) 45 - 117 U/L    Bilirubin, total 0.5 0.2 - 1.0 mg/dl    Protein, total 6.9 6.4 - 8.2 gm/dl    Albumin 3.1 (L) 3.4 - 5.0 gm/dl    Anion gap 8 5 - 15 mmol/L   LIPASE    Collection Time: 11/27/18  7:20 PM   Result  Value Ref Range    Lipase 487 (H) 73 - 393 U/L   POC HCG,URINE  Collection Time: 11/27/18  8:22 PM   Result Value Ref Range    HCG urine, QL negative NEGATIVE,Negative,negative     POC URINE MACROSCOPIC    Collection Time: 11/27/18  8:37 PM   Result Value Ref Range    Glucose Negative NEGATIVE,Negative mg/dl    Bilirubin Negative NEGATIVE,Negative      Ketone Negative NEGATIVE,Negative mg/dl    Specific gravity 1.025 1.005 - 1.030      Blood Trace-intact (A) NEGATIVE,Negative      pH (UA) 6.0 5 - 9      Protein Negative NEGATIVE,Negative mg/dl    Urobilinogen 0.2 0.0 - 1.0 EU/dl    Nitrites Positive (A) NEGATIVE,Negative      Leukocyte Esterase Small (A) NEGATIVE,Negative      Color Yellow      Appearance Clear     POC URINE MICROSCOPIC    Collection Time: 11/27/18  8:37 PM   Result Value Ref Range    Epithelial cells, squamous 15-29 /LPF    WBC 10-14 /HPF    RBC OCCASIONAL /HPF    Bacteria 2+ /HPF       Imaging:    Xr Chest Sngl V    Result Date: 11/27/2018  Clinical history: Abdominal pain EXAMINATION: Single view of the chest 11/27/2018 Correlation: 08/28/2018 FINDINGS: Trachea and heart size are within normal limits. Lungs are clear.     IMPRESSION: No acute pulmonary process.     Ct Abd Pelv W Cont    Result Date: 11/27/2018  Clinical history: Abdominal pain, vomiting EXAMINATION: CT scan abdomen and pelvis with intravenous and oral contrast 11/27/2018. 5 mm spiral scanning is performed from the costophrenic angles to the symphysis pubis. Coronal and sagittal reconstruction imaging has been obtained. Correlation: 08/28/2018 FINDINGS: Lung bases are clear. Degenerative changes of the spine. Liver demonstrates fatty infiltration. Gallbladder, spleen, adrenal glands, kidneys, uterus and adnexa are unremarkable. Peripancreatic inflammatory changes are present compatible with pancreatitis. No pancreatic or common bile duct dilatation. Bladder is underdistended. Colon, terminal ileum and small bowel loops are within  normal limits. Status post gastric bypass surgery.  Abdominal aorta is within normal limits. No dominant lymph node enlargement or free fluid.     IMPRESSION: 1. Acute pancreatitis. 2. Fatty infiltration of the liver. 3. Gastric bypass surgery.         ED Course/Medical Decision Making         ED Course as of Nov 27 2122   Tue Nov 27, 2018   2108 Patient hemodynamically stable.  Pain was treated with morphine and Zofran.  Patient given IV fluids.    Laboratory studies reveal elevated lipase.  And urinary tract infection.    [RS]   2108 CT scan shows findings consistent with acute pancreatitis.    We will page hospitalist for admission.    [RS]   2124 Discussed with Dr. Winona Legato, who agrees to admit.    [RS]      ED Course User Index  [RS] Oneita Hurt, MD         Critical Care: Critical care time excluding procedures, but including direct patient care, reviewing medical records, evaluating results of diagnostic testing, discussions with family members, and consulting with physicians: 30 minutes    Patient was evaluated while wearing a N-95 mask, goggles, and gloves.    Medications   sodium chloride 0.9 % bolus infusion 1,000 mL (1,000 mL IntraVENous New Bag 11/27/18 2003)   ondansetron (ZOFRAN) injection 4 mg (4 mg IntraVENous  Given 11/27/18 2003)   morphine injection 6 mg (6 mg IntraVENous Given 11/27/18 2004)   diatrizoate meg-diatrizoat sod (MD-GASTROVIEW,GASTROGRAFIN) 66-10 % contrast solution 30 mL (30 mL Oral Given 11/27/18 2003)   famotidine (PF) (PEPCID) injection 20 mg (20 mg IntraVENous Given 11/27/18 2002)   iopamidoL (ISOVUE 300) 61 % contrast injection 85 mL (85 mL IntraVENous Given 11/27/18 2056)     Final Diagnosis     1. Alcohol-induced acute pancreatitis, unspecified complication status    2. Abdominal pain, epigastric    3. Intractable vomiting with nausea, unspecified vomiting type        Disposition     Admission    Current Discharge Medication List        Oneita Hurt, M.D. [facility]  November 27, 2018    My signature above authenticates this document and my orders, the final    diagnosis (es), discharge prescription (s), and instructions in the Epic    record.  If you have any questions please contact [phone].     Nursing notes have been reviewed by the physician/ advanced practice    Clinician.    This chart was dictated using Systems analyst. Inadvertent errors may be present.

## 2018-11-27 NOTE — ED Notes (Signed)
Pt here for increased ABD pain,

## 2018-11-27 NOTE — ED Notes (Signed)
Report to Serena RN.

## 2018-11-27 NOTE — Other (Signed)
TRANSFER - IN REPORT:    Verbal report received from Selena, RN(name) on Deeanne L Mayon  being received from ED(unit) for routine progression of care      Report consisted of patient???s Situation, Background, Assessment and   Recommendations(SBAR).     Information from the following report(s) SBAR, Kardex, ED Summary, [facility] and Recent Results was reviewed with the receiving nurse.    Opportunity for questions and clarification was provided.      Assessment completed upon patient???s arrival to unit and care assumed.

## 2018-11-27 NOTE — H&P (Signed)
Hospitalist Admission History and Physical    NAME:  Ashley [name]   DOB:   [birth year]   MRN:   [id number]     PCP:  Roosevelt Locks Da, MD  Admission Date/Time:  11/27/2018 11:06 PM  Anticipated Date of Discharge: 11/30/2018  Anticipated Disposition (home, SNF) : HH         Assessment/Plan:      Active Problems:    Acute pancreatitis (08/28/2018)       Acute pancreatitis  Obesity status post Roux-en-Y gastric bypass  History of alcohol abuse       ___________________________________________________  PLAN:    Admit the patient to medical bed  We will give 1 more liter of IV fluid bolus  N.p.o. except ice chips  Trend H&H to make sure patient is getting adequately hydrated and hemoglobin/hematocrit getting diluted  Protonix IV  Pain control, nausea control  Patient has been having dark stools, hemoglobin was okay and Hemoccult done in the emergency room was negative      Risk of deterioration:  '[]'$ Low    '[x]'$ Moderate  '[]'$ High    Prophylaxis:  '[]'$ Lovenox  '[]'$ Coumadin  '[x]'$ Hep SQ  '[]'$ SCD???s  '[]'$ H2B/PPI'[]'$ Eliquis '[]'$ Xarelto     Disposition:  '[]'$ Home w/ Family   '[x]'$ HH PT,OT,RN   '[]'$ SNF/LTC   '[]'$ SAH/Rehab             Subjective:   CHIEF COMPLAINT:    Chief Complaint   Patient presents with   ??? Abdominal Pain       HISTORY OF PRESENT ILLNESS:     Ashley [name] is a 33 y.o. BLACK OR A[place] AMER[place] female who presents with worsening abdominal pain  Patient has history of alcohol abuse, has been admitted to the hospital for pancreatitis/gastritis secondary to alcohol abuse states that she has no longer drinking since last admission  She has noticed 3-day worth epigastric pain radiating to the back also has been having some black stools nausea vomiting  He said she has had a toothache last week and has been taking ibuprofen around-the-clock    Past Medical History:   Diagnosis Date   ??? Ill-defined condition     bronchitis   ??? Pneumonia         Past Surgical History:   Procedure Laterality Date   ??? HX CESAREAN SECTION  2002, 2004, 2008    x 3    ??? HX CESAREAN SECTION     ??? HX GASTRIC BYPASS  10-2009    [facility]   ??? HX GASTRIC BYPASS     ??? HX GASTRIC BYPASS         Social History     Tobacco Use   ??? Smoking status: Former Smoker     Types: Cigarettes     Last attempt to quit: 11/12/2009     Years since quitting: 9.0   ??? Smokeless tobacco: Never Used   Substance Use Topics   ??? Alcohol use: Not Currently     Alcohol/week: 0.0 - 4.0 standard drinks     Comment: occassional        Family History   Problem Relation Age of Onset   ??? Diabetes Mother    ??? Hypertension Father    ??? Heart Disease Maternal Grandmother    ??? Heart Attack Maternal Grandmother    ??? High Cholesterol Maternal Grandmother    ??? Arthritis-osteo Maternal Grandmother    ??? Stroke Paternal Aunt    ??? Kidney Disease Maternal Aunt  Allergies   Allergen Reactions   ??? Aspirin Other (comments)     Stomach ulcer   ??? Ibuprofen Unknown (comments)     Gastric Bypass--advised to avoid   ??? Nsaids (Non-Steroidal Anti-Inflammatory Drug) Other (comments)     Hx gastric bypass        Prior to Admission Medications   Prescriptions Last Dose Informant Patient Reported? Taking?   acetaminophen (TYLENOL) 325 mg tablet   No No   Sig: Take 2 Tabs by mouth every six (6) hours as needed for Pain.   cephALEXin (Keflex) 500 mg capsule   No No   Sig: Take 1 Cap by mouth three (3) times daily.   ferrous sulfate 325 mg (65 mg iron) tablet   No No   Sig: Take 1 Tab by mouth daily (with breakfast). Indications: anemia from inadequate iron   folic acid (FOLVITE) 1 mg tablet   No No   Sig: Take 1 Tab by mouth daily.   omeprazole (PRILOSEC) 20 mg capsule   No No   Sig: Take 1 Cap by mouth two (2) times a day. Please open capsule and take granules with apple sauce   ondansetron (Zofran ODT) 4 mg disintegrating tablet   No No   Sig: Take 1 Tab by mouth every eight (8) hours as needed for Nausea.   thiamine HCL (B-1) 100 mg tablet   No No   Sig: Take 1 Tab by mouth daily.      Facility-Administered Medications: None            REVIEW OF SYSTEMS:     '[]'$  Unable to obtain  ROS due to  '[]'$ mental status change  '[]'$ sedated   '[]'$ intubated   '[x]'$ Total of 12 systems reviewed as follows:  Constitutional: negative fever, negative chills, negative weight loss  Eyes:   negative visual changes  ENT:   negative sore throat, tongue or lip swelling  Respiratory:  negative cough, negative dyspnea  Cards:  negative for chest pain, palpitations, lower extremity edema  GI:   Positive for nausea vomiting diarrhea and abdominal pain  Genitourinary: negative for frequency, dysuria  Integument:  negative for rash and pruritus  Hematologic:  negative for easy bruising and gum/nose bleeding  Musculoskel: negative for myalgias,  back pain and muscle weakness  Neurological:  negative for headaches, dizziness, vertigo  Behavl/Psych: negative for feelings of anxiety, depression       Objective:   VITALS:    Visit Vitals  BP 112/76 (BP 1 Location: Right arm, BP Patient Position: Supine)   Pulse (!) 58   Temp 99.2 ??F (37.3 ??C)   Resp 20   SpO2 100%     Temp (24hrs), Avg:99.2 ??F (37.3 ??C), Min:99.2 ??F (37.3 ??C), Max:99.2 ??F (37.3 ??C)      PHYSICAL EXAM: (Seen with PPE , gloves , gown , mask n95 and goggles )  General:    Alert, cooperative, no distress, appears stated age.     Head:   Normocephalic, without obvious abnormality, atraumatic.  Eyes:   Conjunctivae clear, anicteric sclerae.  Pupils are equal  Nose:  Nares normal. No drainage or sinus tenderness.  Throat:    Lips, mucosa, and tongue normal.  No Thrush  Neck:  Supple, symmetrical,  no adenopathy, thyroid: non tender    no carotid bruit and no JVD.  Back:    Symmetric,  No CVA tenderness.  Lungs:   Clear to auscultation bilaterally.  No Wheezing or Rhonchi. No rales.  Chest wall:  No tenderness or deformity. No Accessory muscle use.  Heart:   Regular rate and rhythm,  no murmur, rub or gallop.  Abdomen:   Tenderness in the epigastric area, overall no guarding rebound no signs of peritonitis pulses present   Extremities: Mild edema in the lower extremities, weakness which is stable pulses, no sign of ischemia or gangrene  Skin:     Texture, turgor normal. No rashes or lesions.  Not Jaundiced  Psych:  Patient is anxious  Neurologic: EOMs intact. No facial asymmetry. No aphasia or slurred speech. Normal  strength, Alert and oriented X 3.       LAB DATA REVIEWED:    Recent Results (from the past 12 hour(s))   CBC WITH AUTOMATED DIFF    Collection Time: 11/27/18  7:20 PM   Result Value Ref Range    WBC 7.3 4.0 - 11.0 1000/mm3    RBC 3.83 3.60 - 5.20 M/uL    HGB 13.3 13.0 - 17.2 gm/dl    HCT 38.6 37.0 - 50.0 %    MCV 100.8 (H) 80.0 - 98.0 fL    MCH 34.7 (H) 25.4 - 34.6 pg    MCHC 34.5 30.0 - 36.0 gm/dl    PLATELET 261 140 - 450 1000/mm3    MPV 11.3 (H) 6.0 - 10.0 fL    RDW-SD 49.7 (H) 36.4 - 46.3      NRBC 0 0 - 0      IMMATURE GRANULOCYTES 0.3 0.0 - 3.0 %    NEUTROPHILS 68.8 (H) 34 - 64 %    LYMPHOCYTES 23.3 (L) 28 - 48 %    MONOCYTES 6.6 1 - 13 %    EOSINOPHILS 0.7 0 - 5 %    BASOPHILS 0.3 0 - 3 %   METABOLIC PANEL, COMPREHENSIVE    Collection Time: 11/27/18  7:20 PM   Result Value Ref Range    Sodium 139 136 - 145 mEq/L    Potassium 4.1 3.5 - 5.1 mEq/L    Chloride 107 98 - 107 mEq/L    CO2 24 21 - 32 mEq/L    Glucose 90 74 - 106 mg/dl    BUN 6 (L) 7 - 25 mg/dl    Creatinine 0.7 0.6 - 1.3 mg/dl    GFR est AA >60.0      GFR est non-AA >60      Calcium 8.7 8.5 - 10.1 mg/dl    AST (SGOT) 61 (H) 15 - 37 U/L    ALT (SGPT) 72 12 - 78 U/L    Alk. phosphatase 182 (H) 45 - 117 U/L    Bilirubin, total 0.5 0.2 - 1.0 mg/dl    Protein, total 6.9 6.4 - 8.2 gm/dl    Albumin 3.1 (L) 3.4 - 5.0 gm/dl    Anion gap 8 5 - 15 mmol/L   LIPASE    Collection Time: 11/27/18  7:20 PM   Result Value Ref Range    Lipase 487 (H) 73 - 393 U/L   POC HCG,URINE    Collection Time: 11/27/18  8:22 PM   Result Value Ref Range    HCG urine, QL negative NEGATIVE,Negative,negative     POC URINE MACROSCOPIC    Collection Time: 11/27/18  8:37 PM    Result Value Ref Range    Glucose Negative NEGATIVE,Negative mg/dl    Bilirubin Negative NEGATIVE,Negative      Ketone Negative NEGATIVE,Negative mg/dl    Specific gravity 1.025 1.005 - 1.030  Blood Trace-intact (A) NEGATIVE,Negative      pH (UA) 6.0 5 - 9      Protein Negative NEGATIVE,Negative mg/dl    Urobilinogen 0.2 0.0 - 1.0 EU/dl    Nitrites Positive (A) NEGATIVE,Negative      Leukocyte Esterase Small (A) NEGATIVE,Negative      Color Yellow      Appearance Clear     POC URINE MICROSCOPIC    Collection Time: 11/27/18  8:37 PM   Result Value Ref Range    Epithelial cells, squamous 15-29 /LPF    WBC 10-14 /HPF    RBC OCCASIONAL /HPF    Bacteria 2+ /HPF         IMAGING RESULTS:    Xr Chest Sngl V    Result Date: 11/27/2018  Clinical history: Abdominal pain EXAMINATION: Single view of the chest 11/27/2018 Correlation: 08/28/2018 FINDINGS: Trachea and heart size are within normal limits. Lungs are clear.     IMPRESSION: No acute pulmonary process.     Ct Abd Pelv W Cont    Result Date: 11/27/2018  Clinical history: Abdominal pain, vomiting EXAMINATION: CT scan abdomen and pelvis with intravenous and oral contrast 11/27/2018. 5 mm spiral scanning is performed from the costophrenic angles to the symphysis pubis. Coronal and sagittal reconstruction imaging has been obtained. Correlation: 08/28/2018 FINDINGS: Lung bases are clear. Degenerative changes of the spine. Liver demonstrates fatty infiltration. Gallbladder, spleen, adrenal glands, kidneys, uterus and adnexa are unremarkable. Peripancreatic inflammatory changes are present compatible with pancreatitis. No pancreatic or common bile duct dilatation. Bladder is underdistended. Colon, terminal ileum and small bowel loops are within normal limits. Status post gastric bypass surgery.  Abdominal aorta is within normal limits. No dominant lymph node enlargement or free fluid.     IMPRESSION: 1. Acute pancreatitis. 2. Fatty infiltration of the liver. 3.  Gastric bypass surgery.       Care Plan discussed with:     _0 Patient   _1 Family    _2 ED Care Manager  _3 ED Doc   _4 Specialist :    Total care time spent on reviewing the case/data/notes/EMR,examining the patient,documentation,coordinating care with nurses and consultants is 40 minutes.       ___________________________________________________  Admitting Physician: Charlaine Dalton, MD     Dragon medical dictation software was used for portions of this report.  Unintended voice transcription errors may have occurred.

## 2018-11-27 NOTE — ED Notes (Signed)
Pt here for increased ABD pain,

## 2018-11-27 NOTE — Other (Signed)
TRANSFER - OUT REPORT:    Verbal report given to Rachel(name) on Chaslyn L Otten  being transferred to 4west room 4208(unit) for routine progression of care       Report consisted of patient???s Situation, Background, Assessment and   Recommendations(SBAR).     Information from the following report(s) SBAR, MAR, Recent Results and Quality Measures was reviewed with the receiving nurse.    Lines:   Peripheral IV 11/27/18 Left Antecubital (Active)   Site Assessment Clean, dry, & intact 11/27/18 2002   Phlebitis Assessment 0 11/27/18 2002   Dressing Status New 11/27/18 2002   Hub Color/Line Status Pink 11/27/18 2002   Alcohol Cap Used Yes 11/27/18 2002        Opportunity for questions and clarification was provided.      Patient transported with:   [employer]

## 2018-11-27 NOTE — ED Provider Notes (Signed)
[place]  Emergency Department Treatment Report    Patient: Ashley [name] Age: 33 y.o. Sex: female    Date of Birth: [birth year] Admit Date: 11/27/2018 PCP: Roosevelt Locks Da, MD   MRN: [id number]  CSN: [id number]     Room: ER11/ER11 Time Dictated: 7:23 PM        Chief Complaint   Abdominal pain  History of Present Illness   33 y.o. female with history alcohol abuse, and admission to this facility in June 2020 for acute pancreatitis and gastritis secondary to alcohol abuse.  Patient states that she no longer drinking.    She now presents complaining of a 3-day history of epigastric pain, sharp pain which is severe and radiates to her back.  Patient also complains of black stools, nausea and vomiting.    She states that she had a toothache last week, and for several days took ibuprofen around-the-clock.  Review of Systems   Constitutional: No fever, chills, or weight loss  Eyes: No visual symptoms.  ENT: No sore throat, runny nose or ear pain.  Respiratory:  No cough, dyspnea or wheezing.  Cardiovascular: No chest pain, pressure, palpitations, tightness or heaviness.  Gastrointestinal: Positive for nausea, vomiting, diarrhea and abdominal pain.  Genitourinary: No dysuria, frequency, or urgency.  Musculoskeletal: No joint pain or swelling.  Integumentary: No rashes.  Neurological: No headaches, sensory or motor symptoms.  Denies complaints in all other systems.    Past Medical/Surgical History     Past Medical History:   Diagnosis Date   ??? Ill-defined condition     bronchitis   ??? Pneumonia      Past Surgical History:   Procedure Laterality Date   ??? HX CESAREAN SECTION  2002, 2004, 2008    x 3   ??? HX CESAREAN SECTION     ??? HX GASTRIC BYPASS  10-2009    [facility]   ??? HX GASTRIC BYPASS     ??? HX GASTRIC BYPASS         Social History     Social History     Socioeconomic History   ??? Marital status: SINGLE     Spouse name: Not on file   ??? Number of children: Not on file   ??? Years of education: Not on file    ??? Highest education level: Not on file   Occupational History   ??? Not on file   Social Needs   ??? Financial resource strain: Not on file   ??? Food insecurity     Worry: Not on file     Inability: Not on file   ??? Transportation needs     Medical: Not on file     Non-medical: Not on file   Tobacco Use   ??? Smoking status: Former Smoker     Types: Cigarettes     Last attempt to quit: 11/12/2009     Years since quitting: 9.0   ??? Smokeless tobacco: Never Used   Substance and Sexual Activity   ??? Alcohol use: Not Currently     Alcohol/week: 0.0 - 4.0 standard drinks     Comment: occassional   ??? Drug use: No   ??? Sexual activity: Yes     Partners: Male     Birth control/protection: None   Lifestyle   ??? Physical activity     Days per week: Not on file     Minutes per session: Not on file   ??? Stress: Not on file  Relationships   ??? Social Product manager on phone: Not on file     Gets together: Not on file     Attends religious service: Not on file     Active member of club or organization: Not on file     Attends meetings of clubs or organizations: Not on file     Relationship status: Not on file   ??? Intimate partner violence     Fear of current or ex partner: Not on file     Emotionally abused: Not on file     Physically abused: Not on file     Forced sexual activity: Not on file   Other Topics Concern   ??? Not on file   Social History Narrative    ** Merged History Encounter **             Family History     Family History   Problem Relation Age of Onset   ??? Diabetes Mother    ??? Hypertension Father    ??? Heart Disease Maternal Grandmother    ??? Heart Attack Maternal Grandmother    ??? High Cholesterol Maternal Grandmother    ??? Arthritis-osteo Maternal Grandmother    ??? Stroke Paternal Aunt    ??? Kidney Disease Maternal Aunt        Current Medications     Prior to Admission medications    Medication Sig Start Date End Date Taking? Authorizing Provider   ondansetron (Zofran ODT) 4 mg disintegrating tablet Take 1 Tab by mouth  every eight (8) hours as needed for Nausea. 08/31/18   Simoes, Legrand Como, MD   omeprazole (PRILOSEC) 20 mg capsule Take 1 Cap by mouth two (2) times a day. Please open capsule and take granules with apple sauce 08/31/18   Simoes, Ruchita S, MD   folic acid (FOLVITE) 1 mg tablet Take 1 Tab by mouth daily. 09/01/18   Simoes, Ruchita S, MD   thiamine HCL (B-1) 100 mg tablet Take 1 Tab by mouth daily. 09/01/18   Simoes, Ruchita S, MD   cephALEXin (Keflex) 500 mg capsule Take 1 Cap by mouth three (3) times daily. 08/31/18   Simoes, Ruchita S, MD   ferrous sulfate 325 mg (65 mg iron) tablet Take 1 Tab by mouth daily (with breakfast). Indications: anemia from inadequate iron 04/12/18   Marzella Schlein, MD   acetaminophen (TYLENOL) 325 mg tablet Take 2 Tabs by mouth every six (6) hours as needed for Pain. 01/20/18   Haviland Loffler, PA-C     Allergies     Allergies   Allergen Reactions   ??? Aspirin Other (comments)     Stomach ulcer   ??? Ibuprofen Unknown (comments)     Gastric Bypass--advised to avoid   ??? Nsaids (Non-Steroidal Anti-Inflammatory Drug) Other (comments)     Hx gastric bypass     Physical Exam     ED Triage Vitals   ED Encounter Vitals Group      BP       Pulse       Resp       Temp       Temp src       SpO2       Weight       Height        Constitutional: Morbidly obese woman who is alert oriented and nontoxic-appearing.  HEENT: Conjunctiva clear.  PERRLA. Mucous membranes moist, non-erythematous. Surface of the pharynx,  palate, and tongue are pink, moist and without lesions.  Neck: supple, non tender, symmetrical, no masses or JVD.   Respiratory: lungs clear to auscultation, nonlabored respirations. No tachypnea or accessory muscle use.  Cardiovascular: heart regular rate and rhythm without murmur rubs or gallops.   Calves soft and non-tender.  1+ pitting edema bilateral lower extremities.  Gastrointestinal:  Abdomen soft, obese, epigastric abdominal tenderness, no guarding or rebound are noted.   Rectal: Brown heme-negative stool.  Musculoskeletal: Nail beds pink with prompt capillary refill  Integumentary: warm and dry without rashes or lesions  Neurologic: alert and oriented, Sensation intact, motor strength equal and symmetric.  No facial asymmetry or dysarthria.        Impression and Management Plan   This is a new problem for this patient.  Patient presents complaining of epigastric dental pain.  Patient had a history of gastric bypass, so we will need to do a oral contrast CT scan to rule out internal hernia.  We will start Pepcid.  Laboratory studies.  Hydrate patient and treat pain.    Diagnostic Studies   Lab:   Recent Results (from the past 12 hour(s))   CBC WITH AUTOMATED DIFF    Collection Time: 11/27/18  7:20 PM   Result Value Ref Range    WBC 7.3 4.0 - 11.0 1000/mm3    RBC 3.83 3.60 - 5.20 M/uL    HGB 13.3 13.0 - 17.2 gm/dl    HCT 38.6 37.0 - 50.0 %    MCV 100.8 (H) 80.0 - 98.0 fL    MCH 34.7 (H) 25.4 - 34.6 pg    MCHC 34.5 30.0 - 36.0 gm/dl    PLATELET 261 140 - 450 1000/mm3    MPV 11.3 (H) 6.0 - 10.0 fL    RDW-SD 49.7 (H) 36.4 - 46.3      NRBC 0 0 - 0      IMMATURE GRANULOCYTES 0.3 0.0 - 3.0 %    NEUTROPHILS 68.8 (H) 34 - 64 %    LYMPHOCYTES 23.3 (L) 28 - 48 %    MONOCYTES 6.6 1 - 13 %    EOSINOPHILS 0.7 0 - 5 %    BASOPHILS 0.3 0 - 3 %   METABOLIC PANEL, COMPREHENSIVE    Collection Time: 11/27/18  7:20 PM   Result Value Ref Range    Sodium 139 136 - 145 mEq/L    Potassium 4.1 3.5 - 5.1 mEq/L    Chloride 107 98 - 107 mEq/L    CO2 24 21 - 32 mEq/L    Glucose 90 74 - 106 mg/dl    BUN 6 (L) 7 - 25 mg/dl    Creatinine 0.7 0.6 - 1.3 mg/dl    GFR est AA >60.0      GFR est non-AA >60      Calcium 8.7 8.5 - 10.1 mg/dl    AST (SGOT) 61 (H) 15 - 37 U/L    ALT (SGPT) 72 12 - 78 U/L    Alk. phosphatase 182 (H) 45 - 117 U/L    Bilirubin, total 0.5 0.2 - 1.0 mg/dl    Protein, total 6.9 6.4 - 8.2 gm/dl    Albumin 3.1 (L) 3.4 - 5.0 gm/dl    Anion gap 8 5 - 15 mmol/L   LIPASE     Collection Time: 11/27/18  7:20 PM   Result Value Ref Range    Lipase 487 (H) 73 - 393 U/L   POC HCG,URINE  Collection Time: 11/27/18  8:22 PM   Result Value Ref Range    HCG urine, QL negative NEGATIVE,Negative,negative     POC URINE MACROSCOPIC    Collection Time: 11/27/18  8:37 PM   Result Value Ref Range    Glucose Negative NEGATIVE,Negative mg/dl    Bilirubin Negative NEGATIVE,Negative      Ketone Negative NEGATIVE,Negative mg/dl    Specific gravity 1.025 1.005 - 1.030      Blood Trace-intact (A) NEGATIVE,Negative      pH (UA) 6.0 5 - 9      Protein Negative NEGATIVE,Negative mg/dl    Urobilinogen 0.2 0.0 - 1.0 EU/dl    Nitrites Positive (A) NEGATIVE,Negative      Leukocyte Esterase Small (A) NEGATIVE,Negative      Color Yellow      Appearance Clear     POC URINE MICROSCOPIC    Collection Time: 11/27/18  8:37 PM   Result Value Ref Range    Epithelial cells, squamous 15-29 /LPF    WBC 10-14 /HPF    RBC OCCASIONAL /HPF    Bacteria 2+ /HPF       Imaging:    Xr Chest Sngl V    Result Date: 11/27/2018  Clinical history: Abdominal pain EXAMINATION: Single view of the chest 11/27/2018 Correlation: 08/28/2018 FINDINGS: Trachea and heart size are within normal limits. Lungs are clear.     IMPRESSION: No acute pulmonary process.     Ct Abd Pelv W Cont    Result Date: 11/27/2018  Clinical history: Abdominal pain, vomiting EXAMINATION: CT scan abdomen and pelvis with intravenous and oral contrast 11/27/2018. 5 mm spiral scanning is performed from the costophrenic angles to the symphysis pubis. Coronal and sagittal reconstruction imaging has been obtained. Correlation: 08/28/2018 FINDINGS: Lung bases are clear. Degenerative changes of the spine. Liver demonstrates fatty infiltration. Gallbladder, spleen, adrenal glands, kidneys, uterus and adnexa are unremarkable. Peripancreatic inflammatory changes are present compatible with pancreatitis. No pancreatic or common bile duct dilatation. Bladder is  underdistended. Colon, terminal ileum and small bowel loops are within normal limits. Status post gastric bypass surgery.  Abdominal aorta is within normal limits. No dominant lymph node enlargement or free fluid.     IMPRESSION: 1. Acute pancreatitis. 2. Fatty infiltration of the liver. 3. Gastric bypass surgery.         ED Course/Medical Decision Making         ED Course as of Nov 27 2122   Tue Nov 27, 2018   2108 Patient hemodynamically stable.  Pain was treated with morphine and Zofran.  Patient given IV fluids.    Laboratory studies reveal elevated lipase.  And urinary tract infection.    [RS]   2108 CT scan shows findings consistent with acute pancreatitis.    We will page hospitalist for admission.    [RS]   2124 Discussed with Dr. Winona Legato, who agrees to admit.    [RS]      ED Course User Index  [RS] Oneita Hurt, MD         Critical Care: Critical care time excluding procedures, but including direct patient care, reviewing medical records, evaluating results of diagnostic testing, discussions with family members, and consulting with physicians: 30 minutes    Patient was evaluated while wearing a N-95 mask, goggles, and gloves.    Medications   sodium chloride 0.9 % bolus infusion 1,000 mL (1,000 mL IntraVENous New Bag 11/27/18 2003)   ondansetron (ZOFRAN) injection 4 mg (4 mg IntraVENous  Given 11/27/18 2003)   morphine injection 6 mg (6 mg IntraVENous Given 11/27/18 2004)   diatrizoate meg-diatrizoat sod (MD-GASTROVIEW,GASTROGRAFIN) 66-10 % contrast solution 30 mL (30 mL Oral Given 11/27/18 2003)   famotidine (PF) (PEPCID) injection 20 mg (20 mg IntraVENous Given 11/27/18 2002)   iopamidoL (ISOVUE 300) 61 % contrast injection 85 mL (85 mL IntraVENous Given 11/27/18 2056)     Final Diagnosis     1. Alcohol-induced acute pancreatitis, unspecified complication status    2. Abdominal pain, epigastric    3. Intractable vomiting with nausea, unspecified vomiting type        Disposition     Admission     Current Discharge Medication List        Oneita Hurt, M.D. [facility]  November 27, 2018    My signature above authenticates this document and my orders, the final    diagnosis (es), discharge prescription (s), and instructions in the Epic    record.  If you have any questions please contact [phone].     Nursing notes have been reviewed by the physician/ advanced practice    Clinician.    This chart was dictated using Systems analyst. Inadvertent errors may be present.

## 2018-11-28 ENCOUNTER — Inpatient Hospital Stay: Admit: 2018-11-28 | Payer: MEDICAID | Primary: Family Medicine

## 2018-11-28 LAB — POC URINE MACROSCOPIC
Bilirubin, Urine: NEGATIVE
Bilirubin: NEGATIVE
Glucose, Ur: NEGATIVE mg/dl
Glucose: NEGATIVE mg/dl
Ketone: NEGATIVE mg/dl
Ketones, Urine: NEGATIVE mg/dl
Nitrite, Urine: POSITIVE — AB
Nitrites: POSITIVE — AB
Protein, UA: NEGATIVE mg/dl
Protein: NEGATIVE mg/dl
Specific Gravity, UA: 1.025 (ref 1.005–1.030)
Specific gravity: 1.025 (ref 1.005–1.030)
Urobilinogen, UA, POCT: 0.2 EU/dl (ref 0.0–1.0)
Urobilinogen: 0.2 EU/dl (ref 0.0–1.0)
pH (UA): 6 (ref 5–9)
pH, UA: 6 (ref 5–9)

## 2018-11-28 LAB — CBC WITH AUTO DIFFERENTIAL
Basophils %: 0.3 % (ref 0–3)
Eosinophils %: 0.9 % (ref 0–5)
Hematocrit: 34.9 % — ABNORMAL LOW (ref 37.0–50.0)
Hemoglobin: 11.6 gm/dl — ABNORMAL LOW (ref 13.0–17.2)
Immature Granulocytes: 0.3 % (ref 0.0–3.0)
Lymphocytes %: 34.6 % (ref 28–48)
MCH: 34.2 pg (ref 25.4–34.6)
MCHC: 33.2 gm/dl (ref 30.0–36.0)
MCV: 102.9 fL — ABNORMAL HIGH (ref 80.0–98.0)
MPV: 11.2 fL — ABNORMAL HIGH (ref 6.0–10.0)
Monocytes %: 9.2 % (ref 1–13)
Neutrophils %: 54.7 % (ref 34–64)
Nucleated RBCs: 0 (ref 0–0)
Platelets: 230 10*3/uL (ref 140–450)
RBC: 3.39 M/uL — ABNORMAL LOW (ref 3.60–5.20)
RDW-SD: 50.5 — ABNORMAL HIGH (ref 36.4–46.3)
WBC: 6.3 10*3/uL (ref 4.0–11.0)

## 2018-11-28 LAB — COMPREHENSIVE METABOLIC PANEL
ALT: 55 U/L (ref 12–78)
ALT: 72 U/L (ref 12–78)
AST: 42 U/L — ABNORMAL HIGH (ref 15–37)
AST: 61 U/L — ABNORMAL HIGH (ref 15–37)
Albumin: 2.5 gm/dl — ABNORMAL LOW (ref 3.4–5.0)
Albumin: 3.1 gm/dl — ABNORMAL LOW (ref 3.4–5.0)
Alkaline Phosphatase: 150 U/L — ABNORMAL HIGH (ref 45–117)
Alkaline Phosphatase: 182 U/L — ABNORMAL HIGH (ref 45–117)
Anion Gap: 5 mmol/L (ref 5–15)
Anion Gap: 8 mmol/L (ref 5–15)
BUN: 6 mg/dl — ABNORMAL LOW (ref 7–25)
BUN: 6 mg/dl — ABNORMAL LOW (ref 7–25)
CO2: 24 mEq/L (ref 21–32)
CO2: 26 mEq/L (ref 21–32)
Calcium: 8.1 mg/dl — ABNORMAL LOW (ref 8.5–10.1)
Calcium: 8.7 mg/dl (ref 8.5–10.1)
Chloride: 107 mEq/L (ref 98–107)
Chloride: 109 mEq/L — ABNORMAL HIGH (ref 98–107)
Creatinine: 0.6 mg/dl (ref 0.6–1.3)
Creatinine: 0.7 mg/dl (ref 0.6–1.3)
EGFR IF NonAfrican American: >60
EGFR IF NonAfrican American: >60
GFR African American: >60
GFR African American: >60
Glucose: 83 mg/dl (ref 74–106)
Glucose: 90 mg/dl (ref 74–106)
Potassium: 4.1 mEq/L (ref 3.5–5.1)
Potassium: 4.2 mEq/L (ref 3.5–5.1)
Sodium: 139 mEq/L (ref 136–145)
Sodium: 140 mEq/L (ref 136–145)
Total Bilirubin: 0.5 mg/dl (ref 0.2–1.0)
Total Bilirubin: 0.8 mg/dl (ref 0.2–1.0)
Total Protein: 5.9 gm/dl — ABNORMAL LOW (ref 6.4–8.2)
Total Protein: 6.9 gm/dl (ref 6.4–8.2)

## 2018-11-28 LAB — HEMOGLOBIN AND HEMATOCRIT
Hematocrit: 35.1 % — ABNORMAL LOW (ref 37.0–50.0)
Hematocrit: 35.7 % — ABNORMAL LOW (ref 37.0–50.0)
Hemoglobin: 11.6 gm/dl — ABNORMAL LOW (ref 13.0–17.2)
Hemoglobin: 11.6 gm/dl — ABNORMAL LOW (ref 13.0–17.2)

## 2018-11-28 LAB — POC URINE MICROSCOPIC

## 2018-11-28 LAB — LIPID PANEL
CHOL/HDL Ratio: 3.4 Ratio (ref 0.0–4.4)
Chol/HDL Ratio: 3.4 Ratio (ref 0.0–4.4)
Cholesterol, Total: 163 mg/dl (ref 140–199)
Cholesterol, total: 163 mg/dl (ref 140–199)
HDL Cholesterol: 48 mg/dl (ref 40–96)
HDL: 48 mg/dl (ref 40–96)
LDL Calculated: 92 mg/dl (ref 0–130)
LDL, calculated: 92 mg/dl (ref 0–130)
Triglyceride: 117 mg/dl (ref 29–150)
Triglycerides: 117 mg/dl (ref 29–150)

## 2018-11-28 LAB — VITAMIN B12
VITAMIN B12: 545 pg/ml (ref 193–986)
Vitamin B12: 545 pg/ml (ref 193–986)

## 2018-11-28 LAB — FERRITIN
Ferritin: 96.5 ng/mL (ref 8.0–252.0)
Ferritin: 96.5 ng/ml (ref 8.0–252.0)

## 2018-11-28 LAB — LIPASE
Lipase: 443 U/L — ABNORMAL HIGH (ref 73–393)
Lipase: 443 U/L — ABNORMAL HIGH (ref 73–393)
Lipase: 487 U/L — ABNORMAL HIGH (ref 73–393)
Lipase: 487 U/L — ABNORMAL HIGH (ref 73–393)

## 2018-11-28 LAB — IRON
Iron: 55 ug/dL (ref 50–170)
Iron: 55 ug/dL (ref 50–170)

## 2018-11-28 LAB — FOLATE
Folate: 4 ng/ml (ref 3.1–17.5)
Folate: 4 ng/ml (ref 3.1–17.5)

## 2018-11-28 LAB — POC HCG,URINE
HCG urine, QL: NEGATIVE
Pregnancy Test(Urn): NEGATIVE

## 2018-11-28 LAB — TIBC
TIBC: 174 ug/dL — ABNORMAL LOW (ref 250–450)
TIBC: 174 ug/dL — ABNORMAL LOW (ref 250–450)

## 2018-11-28 LAB — METABOLIC PANEL, COMPREHENSIVE
ALT (SGPT): 55 U/L (ref 12–78)
ALT (SGPT): 72 U/L (ref 12–78)
AST (SGOT): 42 U/L — ABNORMAL HIGH (ref 15–37)
AST (SGOT): 61 U/L — ABNORMAL HIGH (ref 15–37)
Albumin: 2.5 gm/dl — ABNORMAL LOW (ref 3.4–5.0)
Albumin: 3.1 gm/dl — ABNORMAL LOW (ref 3.4–5.0)
Alk. phosphatase: 150 U/L — ABNORMAL HIGH (ref 45–117)
Alk. phosphatase: 182 U/L — ABNORMAL HIGH (ref 45–117)
Anion gap: 5 mmol/L (ref 5–15)
Anion gap: 8 mmol/L (ref 5–15)
BUN: 6 mg/dl — ABNORMAL LOW (ref 7–25)
BUN: 6 mg/dl — ABNORMAL LOW (ref 7–25)
Bilirubin, total: 0.5 mg/dl (ref 0.2–1.0)
Bilirubin, total: 0.8 mg/dl (ref 0.2–1.0)
CO2: 24 mEq/L (ref 21–32)
CO2: 26 mEq/L (ref 21–32)
Calcium: 8.1 mg/dl — ABNORMAL LOW (ref 8.5–10.1)
Calcium: 8.7 mg/dl (ref 8.5–10.1)
Chloride: 107 mEq/L (ref 98–107)
Chloride: 109 mEq/L — ABNORMAL HIGH (ref 98–107)
Creatinine: 0.6 mg/dl (ref 0.6–1.3)
Creatinine: 0.7 mg/dl (ref 0.6–1.3)
GFR est AA: >60
GFR est AA: >60
GFR est non-AA: >60
GFR est non-AA: >60
Glucose: 83 mg/dl (ref 74–106)
Glucose: 90 mg/dl (ref 74–106)
Potassium: 4.1 mEq/L (ref 3.5–5.1)
Potassium: 4.2 mEq/L (ref 3.5–5.1)
Protein, total: 5.9 gm/dl — ABNORMAL LOW (ref 6.4–8.2)
Protein, total: 6.9 gm/dl (ref 6.4–8.2)
Sodium: 139 mEq/L (ref 136–145)
Sodium: 140 mEq/L (ref 136–145)

## 2018-11-28 LAB — CBC WITH AUTOMATED DIFF
BASOPHILS: 0.3 % (ref 0–3)
EOSINOPHILS: 0.9 % (ref 0–5)
HCT: 34.9 % — ABNORMAL LOW (ref 37.0–50.0)
HGB: 11.6 gm/dl — ABNORMAL LOW (ref 13.0–17.2)
IMMATURE GRANULOCYTES: 0.3 % (ref 0.0–3.0)
LYMPHOCYTES: 34.6 % (ref 28–48)
MCH: 34.2 pg (ref 25.4–34.6)
MCHC: 33.2 gm/dl (ref 30.0–36.0)
MCV: 102.9 fL — ABNORMAL HIGH (ref 80.0–98.0)
MONOCYTES: 9.2 % (ref 1–13)
MPV: 11.2 fL — ABNORMAL HIGH (ref 6.0–10.0)
NEUTROPHILS: 54.7 % (ref 34–64)
NRBC: 0 (ref 0–0)
PLATELET: 230 10*3/uL (ref 140–450)
RBC: 3.39 M/uL — ABNORMAL LOW (ref 3.60–5.20)
RDW-SD: 50.5 — ABNORMAL HIGH (ref 36.4–46.3)
WBC: 6.3 10*3/uL (ref 4.0–11.0)

## 2018-11-28 LAB — HGB & HCT
HCT: 35.1 % — ABNORMAL LOW (ref 37.0–50.0)
HCT: 35.7 % — ABNORMAL LOW (ref 37.0–50.0)
HGB: 11.6 gm/dl — ABNORMAL LOW (ref 13.0–17.2)
HGB: 11.6 gm/dl — ABNORMAL LOW (ref 13.0–17.2)

## 2018-11-28 MED ORDER — ACETAMINOPHEN 325 MG TABLET
325 mg | ORAL | Status: DC | PRN
Start: 2018-11-28 — End: 2018-12-01

## 2018-11-28 MED ORDER — CEFTRIAXONE 1 GRAM SOLUTION FOR INJECTION
1 gram | INTRAMUSCULAR | Status: DC
Start: 2018-11-28 — End: 2018-11-30
  Administered 2018-11-28 – 2018-11-30 (×3): via INTRAVENOUS

## 2018-11-28 MED ORDER — MULTIVITAMIN,TX-MINERALS TAB
Freq: Every day | ORAL | Status: DC
Start: 2018-11-28 — End: 2018-12-01
  Administered 2018-12-01: 16:00:00 via ORAL

## 2018-11-28 MED ORDER — HYDROCODONE-ACETAMINOPHEN 5 MG-325 MG TAB
5-325 mg | ORAL | Status: DC | PRN
Start: 2018-11-28 — End: 2018-12-01
  Administered 2018-11-28 – 2018-12-01 (×14): via ORAL

## 2018-11-28 MED ORDER — SODIUM CHLORIDE 0.9% BOLUS IV
0.9 % | Freq: Once | INTRAVENOUS | Status: AC
Start: 2018-11-28 — End: 2018-11-27
  Administered 2018-11-28: 04:00:00 via INTRAVENOUS

## 2018-11-28 MED ORDER — NALOXONE 0.4 MG/ML INJECTION
0.4 mg/mL | INTRAMUSCULAR | Status: DC | PRN
Start: 2018-11-28 — End: 2018-12-01

## 2018-11-28 MED ORDER — THIAMINE HCL 100 MG TAB
100 mg | Freq: Every day | ORAL | Status: DC
Start: 2018-11-28 — End: 2018-12-01
  Administered 2018-11-28 – 2018-12-01 (×2): via ORAL

## 2018-11-28 MED ORDER — PANTOPRAZOLE 40 MG IV SOLR
40 mg | Freq: Every day | INTRAVENOUS | Status: DC
Start: 2018-11-28 — End: 2018-12-01
  Administered 2018-11-29 – 2018-12-01 (×3): via INTRAVENOUS

## 2018-11-28 MED ORDER — FERROUS SULFATE 325 MG (65 MG ELEMENTAL IRON) TAB
325 mg (65 mg iron) | Freq: Every day | ORAL | Status: DC
Start: 2018-11-28 — End: 2018-12-01
  Administered 2018-11-28 – 2018-12-01 (×2): via ORAL

## 2018-11-28 MED ORDER — MORPHINE 2 MG/ML INJECTION
2 mg/mL | INTRAMUSCULAR | Status: AC | PRN
Start: 2018-11-28 — End: 2018-11-28
  Administered 2018-11-28 (×3): via INTRAVENOUS

## 2018-11-28 MED ORDER — ACETAMINOPHEN (TYLENOL) SOLUTION 32MG/ML
ORAL | Status: DC | PRN
Start: 2018-11-28 — End: 2018-12-01

## 2018-11-28 MED ORDER — IOPAMIDOL 61 % IV SOLN
30061 mg iodine /mL (61 %) | Freq: Once | INTRAVENOUS | Status: AC
Start: 2018-11-28 — End: 2018-11-27
  Administered 2018-11-28: 01:00:00 via INTRAVENOUS

## 2018-11-28 MED ORDER — SODIUM CHLORIDE 0.9 % IV
INTRAVENOUS | Status: AC
Start: 2018-11-28 — End: 2018-11-28
  Administered 2018-11-28 (×2): via INTRAVENOUS

## 2018-11-28 MED ORDER — HEPARIN (PORCINE) 5,000 UNIT/ML IJ SOLN
5000 unit/mL | Freq: Three times a day (TID) | INTRAMUSCULAR | Status: DC
Start: 2018-11-28 — End: 2018-12-01
  Administered 2018-11-28 – 2018-12-01 (×10): via SUBCUTANEOUS

## 2018-11-28 MED ORDER — LACTATED RINGERS IV
INTRAVENOUS | Status: AC
Start: 2018-11-28 — End: 2018-11-29
  Administered 2018-11-28 – 2018-11-29 (×2): via INTRAVENOUS

## 2018-11-28 MED ORDER — FOLIC ACID 1 MG TAB
1 mg | Freq: Every day | ORAL | Status: DC
Start: 2018-11-28 — End: 2018-12-01
  Administered 2018-11-28 – 2018-12-01 (×2): via ORAL

## 2018-11-28 MED ORDER — D5-LR WITH POTASSIUM CHLORIDE 20 MEQ/L IV
20 mEq/L | INTRAVENOUS | Status: DC
Start: 2018-11-28 — End: 2018-11-28
  Administered 2018-11-28: 16:00:00 via INTRAVENOUS

## 2018-11-28 MED ORDER — OMEPRAZOLE 20 MG CAP, DELAYED RELEASE
20 mg | Freq: Two times a day (BID) | ORAL | Status: DC
Start: 2018-11-28 — End: 2018-11-28
  Administered 2018-11-28: 13:00:00 via ORAL

## 2018-11-28 MED ORDER — MORPHINE 2 MG/ML INJECTION
2 mg/mL | INTRAMUSCULAR | Status: DC | PRN
Start: 2018-11-28 — End: 2018-11-29
  Administered 2018-11-28 – 2018-11-29 (×5): via INTRAVENOUS

## 2018-11-28 MED ORDER — ONDANSETRON (PF) 4 MG/2 ML INJECTION
4 mg/2 mL | INTRAMUSCULAR | Status: DC | PRN
Start: 2018-11-28 — End: 2018-12-01
  Administered 2018-11-28 – 2018-12-01 (×16): via INTRAVENOUS

## 2018-11-28 MED ORDER — ACETAMINOPHEN 650 MG RECTAL SUPPOSITORY
650 mg | RECTAL | Status: DC | PRN
Start: 2018-11-28 — End: 2018-11-28

## 2018-11-28 MED ORDER — LACTATED RINGERS IV
INTRAVENOUS | Status: DC
Start: 2018-11-28 — End: 2018-11-28

## 2018-11-28 MED ORDER — HYDROCODONE-ACETAMINOPHEN 5 MG-325 MG TAB
5-325 mg | ORAL | Status: DC | PRN
Start: 2018-11-28 — End: 2018-11-28
  Administered 2018-11-28 (×2): via ORAL

## 2018-11-28 MED FILL — D5-LR WITH POTASSIUM CHLORIDE 20 MEQ/L IV: 20 mEq/L | INTRAVENOUS | Qty: 1000

## 2018-11-28 MED FILL — CEFTRIAXONE 1 GRAM SOLUTION FOR INJECTION: 1 gram | INTRAMUSCULAR | Qty: 1

## 2018-11-28 MED FILL — ISOVUE-300  61 % INTRAVENOUS SOLUTION: 300 mg iodine /mL (61 %) | INTRAVENOUS | Qty: 100

## 2018-11-28 MED FILL — ONDANSETRON (PF) 4 MG/2 ML INJECTION: 4 mg/2 mL | INTRAMUSCULAR | Qty: 2

## 2018-11-28 MED FILL — SODIUM CHLORIDE 0.9 % IV: INTRAVENOUS | Qty: 1000

## 2018-11-28 MED FILL — MORPHINE 2 MG/ML INJECTION: 2 mg/mL | INTRAMUSCULAR | Qty: 2

## 2018-11-28 MED FILL — FOLIC ACID 1 MG TAB: 1 mg | ORAL | Qty: 1

## 2018-11-28 MED FILL — FERROUS SULFATE 325 MG (65 MG ELEMENTAL IRON) TAB: 325 mg (65 mg iron) | ORAL | Qty: 1

## 2018-11-28 MED FILL — VITAMIN B-1 100 MG TABLET: 100 mg | ORAL | Qty: 1

## 2018-11-28 MED FILL — HYDROCODONE-ACETAMINOPHEN 5 MG-325 MG TAB: 5-325 mg | ORAL | Qty: 1

## 2018-11-28 MED FILL — HYDROCODONE-ACETAMINOPHEN 5 MG-325 MG TAB: 5-325 mg | ORAL | Qty: 2

## 2018-11-28 MED FILL — HEPARIN (PORCINE) 5,000 UNIT/ML IJ SOLN: 5000 unit/mL | INTRAMUSCULAR | Qty: 1

## 2018-11-28 MED FILL — LACTATED RINGERS IV: INTRAVENOUS | Qty: 1000

## 2018-11-28 MED FILL — OMEPRAZOLE 20 MG CAP, DELAYED RELEASE: 20 mg | ORAL | Qty: 1

## 2018-11-28 MED FILL — MORPHINE 2 MG/ML INJECTION: 2 mg/mL | INTRAMUSCULAR | Qty: 1

## 2018-11-28 NOTE — Progress Notes (Signed)
Problem: Falls - Risk of  Goal: *Absence of Falls  Description: Document Schmid Fall Risk and appropriate interventions in the flowsheet.  Outcome: Progressing Towards Goal  Note: Fall Risk Interventions:  Mobility Interventions: Assess mobility with egress test, Communicate number of staff needed for ambulation/transfer, Patient to call before getting OOB         Medication Interventions: Teach patient to arise slowly, Patient to call before getting OOB

## 2018-11-28 NOTE — Progress Notes (Signed)
Attempted to do CM assessment. Called pt in room, no answer.

## 2018-11-28 NOTE — Progress Notes (Signed)
Progress  Notes by Holland Falling, MD at 11/28/18 [phone]                Author: Holland Falling, MD  Service: Hospitalist  Author Type: Physician       Filed: 11/28/18 1159  Date of Service: 11/28/18 0914  Status: Addendum          Editor: Holland Falling, MD (Physician)          Related Notes: Original Note by Holland Falling, MD (Physician) filed at 11/28/18 1157                       INTERNAL MEDICINE PROGRESS NOTE      Date of note:      November 28, 2018   Patient:               Ashley [name] , 33 y.o., female   Admit Date:        11/27/2018   Length of Stay:  1 day(s)        Overnight/24-hour Events:      Reviewed.     Subjective:       Abdominal pain     Assessment:             1.  Acute pancreatitis -suspect most likely from alcohol use.  She states that she has reduced amount of alcohol she drinks.  However she has had  previous pancreatitis and is more susceptible to further episodes even if she drinks less.  CT Peripancreatic stranding noted on imaging.  Lipase 443.  AST 42 ALT 55 calcium 8.1 TG 117   2.  Fatty liver noted on imaging -suspect from alcohol use   3.  RUQ ultrasound 08/28/2018: Gallbladder wall polyp.  3.1 x 2.1 cm fluid collection again noted anterior to head of pancreas unchanged from February 2020   4.  Questionable UTI- positive nitrites small LE with 10-10 WBCs 2+ bacteria   5.  History of Roux-en-Y gastric bypass   6.  Obesity BMI 36     Plan:             ??  Continue IV fluids   ??  Continue pain control   ??  Continue antiemetics   ??  Continue Protonix IV   ??  Trend hemoglobin and hematocrit   ??  Hemoccult negative in the ED we will monitor for evidence of GI bleed.  Hemoglobin 11.6 this a.m.   ??  On rocephine for UTI awaiting urine cultures   ??  GI consulted    ??  RUQ ultrasound pending      Diet: NPO   DVT ppx: SCDs        Total clinical care time is 35 minutes, including review of chart including all labs, radiology, past medical history, and discussion with patient and  family. Greater  than 50% of my time was spent in coordination of care and counseling          Disposition:                 home     Objective:     BP 103/60 (BP 1 Location: Right arm, BP Patient Position: Supine)    Pulse 78    Temp 97.9 ??F (36.6 ??C)    Resp 16    SpO2 100%       General: Young female complaining of mild pain but no acute distress   Cardiac:  Regular rate and rhythm   Lungs: Clear to auscultation   Abdomen: Tenderness in epigastric and right upper quadrant region without rebound or guarding.  Bowel sounds present   MSK: no edema   Psych: mood appropriate   Neuro: alert oriented x3. No FNDs      Current Medications:        Current Facility-Administered Medications:    ?  ferrous sulfate tablet 325 mg, 325 mg, Oral, DAILY WITH BREAKFAST, Charlaine Dalton, MD, 325 mg at 11/28/18 2202   ?  folic acid (FOLVITE) tablet 1 mg, 1 mg, Oral, DAILY, Charlaine Dalton, MD, 1 mg at 11/28/18 5427   ?  omeprazole (PRILOSEC) capsule 20 mg, 20 mg, Oral, BID, Charlaine Dalton, MD, 20 mg at 11/28/18 0623   ?  thiamine HCL (B-1) tablet 100 mg, 100 mg, Oral, DAILY, Charlaine Dalton, MD, 100 mg at 11/28/18 7628   ?  naloxone [facility]) injection 0.1 mg, 0.1 mg, IntraVENous, PRN, Charlaine Dalton, MD   ?  ondansetron [facility]) injection 4 mg, 4 mg, IntraVENous, Q4H PRN, Charlaine Dalton, MD, 4 mg at 11/28/18 3151   ?  acetaminophen (TYLENOL) tablet 650 mg, 650 mg, Oral, Q4H PRN **OR** acetaminophen (TYLENOL) solution 650 mg, 650 mg, Oral, Q4H PRN **OR** acetaminophen (TYLENOL) suppository 650 mg, 650 mg, Rectal, Q4H PRN, Charlaine Dalton, MD   ?  0.9% sodium chloride infusion, 150 mL/hr, IntraVENous, CONTINUOUS, Charlaine Dalton, MD, Last Rate: 150 mL/hr at 11/28/18 0832, 150 mL/hr at 11/28/18 7616   ?  HYDROcodone-acetaminophen (NORCO) 5-325 mg per tablet 1 Tab, 1 Tab, Oral, Q4H PRN, Charlaine Dalton, MD, 1 Tab at 11/28/18 0707   ?  heparin (porcine) injection 5,000 Units, 5,000 Units, SubCUTAneous, Q8H, Charlaine Dalton, MD, 5,000 Units at 11/28/18 0737   ?   cefTRIAXone (ROCEPHIN) 1 g in 0.9% sodium chloride (MBP/ADV) 50 mL MBP, 1 g, IntraVENous, Q24H, Charlaine Dalton, MD, Last Rate: 100 mL/hr at 11/27/18 2337, 1 g at 11/27/18 2337     Labs:          Recent Results (from the past 24 hour(s))     CBC WITH AUTOMATED DIFF          Collection Time: 11/27/18  7:20 PM         Result  Value  Ref Range            WBC  7.3  4.0 - 11.0 1000/mm3       RBC  3.83  3.60 - 5.20 M/uL       HGB  13.3  13.0 - 17.2 gm/dl       HCT  38.6  37.0 - 50.0 %       MCV  100.8 (H)  80.0 - 98.0 fL       MCH  34.7 (H)  25.4 - 34.6 pg       MCHC  34.5  30.0 - 36.0 gm/dl       PLATELET  261  140 - 450 1000/mm3       MPV  11.3 (H)  6.0 - 10.0 fL       RDW-SD  49.7 (H)  36.4 - 46.3              NRBC  0  0 - 0              IMMATURE GRANULOCYTES  0.3  0.0 - 3.0 %       NEUTROPHILS  68.8 (H)  34 -  64 %       LYMPHOCYTES  23.3 (L)  28 - 48 %       MONOCYTES  6.6  1 - 13 %       EOSINOPHILS  0.7  0 - 5 %       BASOPHILS  0.3  0 - 3 %       METABOLIC PANEL, COMPREHENSIVE          Collection Time: 11/27/18  7:20 PM         Result  Value  Ref Range            Sodium  139  136 - 145 mEq/L       Potassium  4.1  3.5 - 5.1 mEq/L       Chloride  107  98 - 107 mEq/L       CO2  24  21 - 32 mEq/L       Glucose  90  74 - 106 mg/dl       BUN  6 (L)  7 - 25 mg/dl       Creatinine  0.7  0.6 - 1.3 mg/dl       GFR est AA  >60.0          GFR est non-AA  >60          Calcium  8.7  8.5 - 10.1 mg/dl       AST (SGOT)  61 (H)  15 - 37 U/L       ALT (SGPT)  72  12 - 78 U/L       Alk. phosphatase  182 (H)  45 - 117 U/L       Bilirubin, total  0.5  0.2 - 1.0 mg/dl       Protein, total  6.9  6.4 - 8.2 gm/dl       Albumin  3.1 (L)  3.4 - 5.0 gm/dl       Anion gap  8  5 - 15 mmol/L       LIPASE          Collection Time: 11/27/18  7:20 PM         Result  Value  Ref Range            Lipase  487 (H)  73 - 393 U/L       POC HCG,URINE          Collection Time: 11/27/18  8:22 PM         Result  Value  Ref Range            HCG urine, QL   negative  NEGATIVE,Negative,negative         POC URINE MACROSCOPIC          Collection Time: 11/27/18  8:37 PM         Result  Value  Ref Range            Glucose  Negative  NEGATIVE,Negative mg/dl       Bilirubin  Negative  NEGATIVE,Negative         Ketone  Negative  NEGATIVE,Negative mg/dl       Specific gravity  1.025  1.005 - 1.030         Blood  Trace-intact (A)  NEGATIVE,Negative         pH (UA)  6.0  5 - 9         Protein  Negative  NEGATIVE,Negative mg/dl       Urobilinogen  0.2  0.0 - 1.0 EU/dl       Nitrites  Positive (A)  NEGATIVE,Negative         Leukocyte Esterase  Small (A)  NEGATIVE,Negative         Color  Yellow          Appearance  Clear          POC URINE MICROSCOPIC          Collection Time: 11/27/18  8:37 PM         Result  Value  Ref Range            Epithelial cells, squamous  15-29  /LPF       WBC  10-14  /HPF       RBC  OCCASIONAL  /HPF       Bacteria  2+  /HPF       HGB & HCT          Collection Time: 11/27/18 11:29 PM         Result  Value  Ref Range            HGB  11.6 (L)  13.0 - 17.2 gm/dl       HCT  35.1 (L)  37.0 - 50.0 %       CBC WITH AUTOMATED DIFF          Collection Time: 11/28/18  3:34 AM         Result  Value  Ref Range            WBC  6.3  4.0 - 11.0 1000/mm3       RBC  3.39 (L)  3.60 - 5.20 M/uL       HGB  11.6 (L)  13.0 - 17.2 gm/dl       HCT  34.9 (L)  37.0 - 50.0 %       MCV  102.9 (H)  80.0 - 98.0 fL       MCH  34.2  25.4 - 34.6 pg       MCHC  33.2  30.0 - 36.0 gm/dl       PLATELET  230  140 - 450 1000/mm3       MPV  11.2 (H)  6.0 - 10.0 fL       RDW-SD  50.5 (H)  36.4 - 46.3         NRBC  0  0 - 0         IMMATURE GRANULOCYTES  0.3  0.0 - 3.0 %       NEUTROPHILS  54.7  34 - 64 %       LYMPHOCYTES  34.6  28 - 48 %       MONOCYTES  9.2  1 - 13 %       EOSINOPHILS  0.9  0 - 5 %       BASOPHILS  0.3  0 - 3 %       METABOLIC PANEL, COMPREHENSIVE          Collection Time: 11/28/18  3:34 AM         Result  Value  Ref Range            Sodium  140  136 - 145 mEq/L       Potassium   4.2  3.5 - 5.1 mEq/L       Chloride  109 (H)  98 - 107 mEq/L       CO2  26  21 - 32 mEq/L       Glucose  83  74 - 106 mg/dl       BUN  6 (L)  7 - 25 mg/dl       Creatinine  0.6  0.6 - 1.3 mg/dl       GFR est AA  >60.0          GFR est non-AA  >60          Calcium  8.1 (L)  8.5 - 10.1 mg/dl       AST (SGOT)  42 (H)  15 - 37 U/L       ALT (SGPT)  55  12 - 78 U/L       Alk. phosphatase  150 (H)  45 - 117 U/L       Bilirubin, total  0.8  0.2 - 1.0 mg/dl       Protein, total  5.9 (L)  6.4 - 8.2 gm/dl       Albumin  2.5 (L)  3.4 - 5.0 gm/dl       Anion gap  5  5 - 15 mmol/L       LIPID PANEL          Collection Time: 11/28/18  3:34 AM         Result  Value  Ref Range            Cholesterol, total  163  140 - 199 mg/dl       HDL Cholesterol  48  40 - 96 mg/dl       Triglyceride  117  29 - 150 mg/dl       LDL, calculated  92  0 - 130 mg/dl       CHOL/HDL Ratio  3.4  0.0 - 4.4 Ratio       LIPASE          Collection Time: 11/28/18  3:34 AM         Result  Value  Ref Range            Lipase  443 (H)  73 - 393 U/L          Radiology     Xr Chest Sngl V      Result Date: 11/27/2018   Clinical history: Abdominal pain EXAMINATION: Single view of the chest 11/27/2018 Correlation: 08/28/2018 FINDINGS: Trachea and heart size are within normal limits. Lungs are clear.       IMPRESSION: No acute pulmonary process.       Ct Abd Pelv W Cont      Result Date: 11/27/2018   Clinical history: Abdominal pain, vomiting EXAMINATION: CT scan abdomen and pelvis with intravenous and oral contrast 11/27/2018. 5 mm spiral scanning is performed from the costophrenic angles to the symphysis pubis. Coronal and sagittal reconstruction  imaging has been obtained. Correlation: 08/28/2018 FINDINGS: Lung bases are clear. Degenerative changes of the spine. Liver demonstrates fatty infiltration. Gallbladder, spleen, adrenal glands, kidneys, uterus and adnexa are unremarkable. Peripancreatic  inflammatory changes are present compatible with pancreatitis. No  pancreatic or common bile duct dilatation. Bladder is underdistended. Colon, terminal ileum and small bowel loops are within normal limits. Status post gastric bypass surgery.  Abdominal  aorta is within normal limits. No dominant lymph node enlargement or free fluid.       IMPRESSION: 1. Acute pancreatitis.  2. Fatty infiltration of the liver. 3. Gastric bypass surgery.           Portions of this electronic record were dictated using Systems analyst. Unintended errors in translation may occur.         Holland Falling, MD   [facility]   November 28, 2018   9:14 AM

## 2018-11-28 NOTE — Progress Notes (Signed)
Problem: Pain  Goal: *Control of Pain  Outcome: Progressing Towards Goal     Problem: Patient Education: Go to Patient Education Activity  Goal: Patient/Family Education  Outcome: Progressing Towards Goal     Problem: Falls - Risk of  Goal: *Absence of Falls  Description: Document Schmid Fall Risk and appropriate interventions in the flowsheet.  Outcome: Progressing Towards Goal  Note: Fall Risk Interventions:            Medication Interventions: Patient to call before getting OOB, Teach patient to arise slowly                   Problem: Patient Education: Go to Patient Education Activity  Goal: Patient/Family Education  Outcome: Progressing Towards Goal

## 2018-11-28 NOTE — Consults (Signed)
Consults by Sloan Leiter, MD at 11/28/18 1155                Author: Sloan Leiter, MD  Service: Gastroenterology  Author Type: Physician       Filed: 11/28/18 1707  Date of Service: 11/28/18 1155  Status: Addendum          Editor: Sloan Leiter, MD (Physician)          Related Notes: Original Note by Ardeth Sportsman, NP (Nurse Practitioner) filed at 11/28/18  1706                          Gastroenterology Consult          Patient: Ashley [name]  MRN: [id number]   SSN: [id number]          Date of Birth: 04/19/1985   Age: 33 y.o.   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 No pancreatic or common bile duct dilatation. Colon, terminal ileum and small bowel loops are within normal. Status post gastric bypass surgery. No free fluid. Gallbladder spleen unremarkable.   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 R/o B12 and Folate Deficiency-Hgb 11.6gm/dl. No active bleeding    -EGD 04/06/2018 with enteroscopy??revealed a??normal mucosa but unable to reach J-J anastomosis   --Elevated LFTs thought to be EtOH induced     -Acute hepatitis panel 08/29/18 negative     -CT notes fatty infiltration of the liver   --Hx of possible seasonal duodenal ulcer perforation with possible abscess   --Hx of Roux-en-Y gastric bypass   --Family hx??of Ulcerative??Colitis-Mother   --Family hx Colon??Cancer-Maternal??Grandfather   --Probable UTI-Treatment per Primary      Plan:     --Monitor BMP, CBC   --Start Protonix 40mg  every day    --Will order Iron studies, B12 and folate   --Recommend switch LR and increase to 250ml/hr x 12hours, then decrease to 155ml/hr and reassess in am and adjust accordingly   --Judicious pain management   --Bowel rest for now, May trial clear liquids in morning and advance to low fat low cholesterol diet as tolerated    --Educated the pt about continued EtOH use and strongly encouraged  the patient to quit   --Continue Thiamine, Folic acid and start MVI   --No further GI recommendations or interventions planned at this time    --Once discharged, patient will need to follow-up as an outpatient with Dr. Delma Freeze in [place]       Will not actively follow but will be available as needed         Subjective:         Ashley [name] is a 33 y.o. female who is being seen for pancreatitis. 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 She states that she was in her usual state of health until a few days ago when she began experiencing sharp achy abdominal pain most prominent in her epigastric area. The patient also reports  nausea and vomiting of bilious liquid times multiple episodes prior to hospitalization. She reports feeling feverish with associated chills but denies any documented fever. She denies shortness of breath or chest pain. She reports a decreased appetite  but denies weight  loss. Her normal bowel pattern consist of daily to every other day firm to soft brown color stools that last BM yesterday noted as dark brown in color. She denies hematochezia or melena. She states that she significantly decreased her  alcohol intake but has been consuming wine. She also reports having a toothache in which she has taken ibuprofen and Aleve and pain. Her most recent endoscopic evaluation as noted above. She denies any other complaints at this time.          Hospital Problems   Date Reviewed:  04/11/2018                         Codes  Class  Noted  POA              Acute pancreatitis  ICD-10-CM: K85.90   ICD-9-CM: 577.0    08/28/2018  Unknown                         Past Medical History:        Diagnosis  Date         ?  Ill-defined condition            bronchitis         ?  Pneumonia            Past Surgical History:         Procedure  Laterality  Date          ?  HX CESAREAN SECTION    2002, 2004, 2008          x 3           ?  HX CESAREAN SECTION         ?  [place]    [facility] [place] [place]          ?  HX GASTRIC BYPASS              ?  HX GASTRIC BYPASS               Family History         Problem  Relation  Age of Onset          ?  Diabetes  Mother       ?  Hypertension  Father       ?  Heart Disease  Maternal Grandmother       ?  Heart Attack  Maternal Grandmother       ?  High Cholesterol  Maternal Grandmother       ?  Arthritis-osteo  Maternal Grandmother       ?  Stroke  Paternal Aunt            ?  Kidney Disease  Maternal Aunt            Social History          Tobacco Use         ?  Smoking status:  Former Smoker              Types:  Cigarettes         Last attempt to quit:  11/12/2009         Years since quitting:  9.0         ?  Smokeless tobacco:  Never Used  Substance Use Topics         ?  Alcohol use:  Not Currently              Alcohol/week:  0.0 - 4.0 standard drinks             Comment: occassional           Current Facility-Administered Medications             Medication  Dose  Route  Frequency  Provider  Last Rate  Last Dose              ?  HYDROcodone-acetaminophen (NORCO) 5-325 mg per tablet 1-2 Tab   1-2 Tab  Oral  Q4H PRN  Rande BruntKhan, Aamir, MD           ?  dextrose 5% - LR with KCl 20 mEq/L infusion     IntraVENous  CONTINUOUS  Rande BruntKhan, Aamir, MD           ?  ferrous sulfate tablet 325 mg   325 mg  Oral  DAILY WITH Noemi ChapelBREAKFAST  Verma, Sourabh, MD     325 mg at 11/28/18 [id number]     ?  folic acid (FOLVITE) tablet 1 mg   1 mg  Oral  DAILY  Roderic ScarceVerma, Sourabh, MD     1 mg at 11/28/18 [id number]     ?  omeprazole (PRILOSEC) capsule 20 mg   20 mg  Oral  BID  Roderic ScarceVerma, Sourabh, MD     20 mg at 11/28/18 [id number]     ?  thiamine HCL (B-1) tablet 100 mg   100 mg  Oral  DAILY  Roderic ScarceVerma, Sourabh, MD     100 mg at 11/28/18 [id number]     ?  naloxone [facility](NARCAN) injection 0.1 mg   0.1 mg  IntraVENous  PRN  Roderic ScarceVerma, Sourabh, MD           ?  ondansetron [facility](ZOFRAN) injection 4 mg   4 mg  IntraVENous  Q4H PRN  Roderic ScarceVerma, Sourabh, MD     4 mg at 11/28/18 [id number]      ?  acetaminophen (TYLENOL) tablet 650 mg   650 mg  Oral  Q4H PRN  Roderic ScarceVerma, Sourabh, MD                Or              ?  acetaminophen (TYLENOL) solution 650 mg   650 mg  Oral  Q4H PRN  Roderic ScarceVerma, Sourabh, MD           ?  heparin (porcine) injection 5,000 Units   5,000 Units  SubCUTAneous  Q8H  Roderic ScarceVerma, Sourabh, MD     5,000 Units at 11/28/18 [id number]              ?  cefTRIAXone (ROCEPHIN) 1 g in 0.9% sodium chloride (MBP/ADV) 50 mL MBP   1 g  IntraVENous  Q24H  Roderic ScarceVerma, Sourabh, MD  100 mL/hr at 11/27/18 2337  1 g at 11/27/18 2337           Allergies        Allergen  Reactions         ?  Aspirin  Other (comments)             Stomach ulcer         ?  Ibuprofen  Unknown (comments)             Gastric Bypass--advised  to avoid         ?  Nsaids (Non-Steroidal Anti-Inflammatory Drug)  Other (comments)             Hx gastric bypass        Review of Systems:   A 12 point review of systems were obtained with pertinent positives noted in above HPI, otherwise negative         Objective:        Patient Vitals for the past 24 hrs:            Temp  Pulse  Resp  BP  SpO2            11/28/18 1141  98.2 ??F (36.8 ??C)  (!) 118  16  117/75  90 %            11/28/18 0808  97.9 ??F (36.6 ??C)  78  16  103/60  100 %     11/28/18 0503  98 ??F (36.7 ??C)  64  20  115/75  100 %     11/27/18 2330  98.8 ??F (37.1 ??C)  (!) 55  20  115/72  100 %     11/27/18 2225  99.2 ??F (37.3 ??C)  (!) 58  20  112/76  100 %     11/27/18 1933  --  72  19  105/78  99 %            11/27/18 1928  --  74  20  --  99 %        Physical Exam:   GENERAL: alert, pleasant, cooperative, no distress, appears  stated age   EYE: No scleral icterus noted    LUNG: symmetric chest expansion, no wheeze, rhonchi or rales    HEART: tachycardic   ABDOMEN: soft, epigastric tender ness appreciated on palpation, bowel sounds active. No  palpable masses, hernias or organomegaly  appreciated    EXTREMITIES: no edema   SKIN: no rash or abnormalities   NEUROLOGIC: AOx3      Diagnostic Imaging:   Xr Chest  Sngl V      Result Date: 11/27/2018   Clinical history: Abdominal pain EXAMINATION: Single view of the chest 11/27/2018 Correlation: 08/28/2018 FINDINGS: Trachea and heart size are within normal limits. Lungs are clear.       IMPRESSION: No acute pulmonary process.       Ct Abd Pelv W Cont      Result Date: 11/27/2018   Clinical history: Abdominal pain, vomiting EXAMINATION: CT scan abdomen and pelvis with intravenous and oral contrast 11/27/2018. 5 mm spiral scanning is performed from the costophrenic angles to the symphysis pubis. Coronal and sagittal reconstruction  imaging has been obtained. Correlation: 08/28/2018 FINDINGS: Lung bases are clear. Degenerative changes of the spine. Liver demonstrates fatty infiltration. Gallbladder, spleen, adrenal glands, kidneys, uterus and adnexa are unremarkable. Peripancreatic  inflammatory changes are present compatible with pancreatitis. No pancreatic or common bile duct dilatation. Bladder is underdistended. Colon, terminal ileum and small bowel loops are within normal limits. Status post gastric bypass surgery.  Abdominal  aorta is within normal limits. No dominant lymph node enlargement or free fluid.       IMPRESSION: 1. Acute pancreatitis. 2. Fatty infiltration of the liver. 3. Gastric bypass surgery.       Laboratory Results:         Labs:  Results:        Chemistry  Recent Labs  11/28/18   0334  11/27/18   1920      GLU  83  90      NA  140  139      K  4.2  4.1      CL  109*  107      CO2  26  24      BUN  6*  6*      CREA  0.6  0.7      CA  8.1*  8.7      AGAP  5  8      AP  150*  182*      TP  5.9*  6.9      ALB  2.5*  3.1*          CrCl cannot be calculated (Unknown ideal weight.).        CBC w/Diff  Recent Labs         11/28/18   0334  11/27/18   2329  11/27/18   1920      WBC  6.3   --   7.3      RBC  3.39*   --   3.83      HGB  11.6*  11.6*  13.3      HCT  34.9*  35.1*  38.6      PLT  230   --   261      GRANS  54.7   --   68.8*      LYMPH  34.6   --   23.3*       EOS  0.9   --   0.7                 Cardiac Enzymes  No results for input(s): CPK, CKND1, MYO in the last 72 hours.      No lab exists for component: CKRMB, TROIP     Coagulation  No results for input(s): PTP, INR, APTT, INREXT in the last 72 hours.         Hepatitis Panel  Lab Results      Component  Value  Date/Time        Hepatitis A, IgM  NON-REACTIVE  08/29/2018 02:35 AM        Hepatitis B surface Ag  NON-REACTIVE  08/29/2018 02:35 AM        Hepatitis B core, IgM  NON-REACTIVE  08/29/2018 02:35 AM        Hep B surface Ag screen  Negative  12/20/2010 12:00 AM        Hepatitis C virus Ab  NON-REACTIVE  08/29/2018 02:35 AM            Recent Labs         11/28/18   0334  11/27/18   1920      ALT  55  72      TBILI  0.8  0.5      AP  150*  182*      ALB  2.5*  3.1*      TP  5.9*  6.9                       Amylase Lipase          Liver Enzymes  Recent Labs         11/28/18   0334      TP  5.9*  ALB  2.5*      AP  150*      ALT  55                 Thyroid Studies  No results for input(s): T4, T3U, TSH, TSHEXT in the last 72 hours.      No lab exists for component: T3RU          Pathology  pathology           Signed By:  Myrla Halsted MS, APRN, FNP-C       November 28, 2018           Office: [phone]           I was personally available for consultation. I have reviewed patient's record and  agree with the documentation recorded by the Physician Asst/Nurse Practitioner, including the assessment, treatment plan, and disposition.      Raynelle Jan, MD   November 28, 2018

## 2018-11-28 NOTE — Progress Notes (Signed)
Bedside shift change report given to Toniann Fail RN (oncoming nurse) by Farley Ly (offgoing nurse). Report included the following information SBAR, Kardex, Procedure Summary, Intake/Output, MAR and Recent Results.

## 2018-11-28 NOTE — Consults (Addendum)
Gastroenterology Consult    Patient: Ashley [name] MRN: [id number]  SSN: [id number]    Date of Birth: 20-Oct-1985  Age: 33 y.o.  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 No pancreatic or common bile duct dilatation. Colon, terminal ileum and small bowel loops are within normal. Status post gastric bypass surgery. No free fluid. Gallbladder spleen unremarkable.  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 R/o B12 and Folate Deficiency-Hgb 11.6gm/dl. No active bleeding   -EGD 04/06/2018 with enteroscopy??revealed a??normal mucosa but unable to reach J-J anastomosis  --Elevated LFTs thought to be EtOH induced    -Acute hepatitis panel 08/29/18 negative    -CT notes fatty infiltration of the liver  --Hx of possible seasonal duodenal ulcer perforation with possible abscess  --Hx of Roux-en-Y gastric bypass  --Family hx??of Ulcerative??Colitis-Mother  --Family hx Colon??Cancer-Maternal??Grandfather  --Probable UTI-Treatment per Primary   Plan:   --Monitor BMP, CBC  --Start Protonix 40mg  every day   --Will order Iron studies, B12 and folate  --Recommend switch LR and increase to 21ml/hr x 12hours, then decrease to 19ml/hr and reassess in am and adjust accordingly  --Judicious pain management  --Bowel rest for now, May trial clear liquids in morning and advance to low fat low cholesterol diet as tolerated   --Educated the pt about continued EtOH use and strongly encouraged the patient to quit  --Continue Thiamine, Folic acid and start MVI  --No further GI recommendations or interventions planned at this time   --Once discharged, patient will need to follow-up as an outpatient with Dr. Leonard Downing in 3-4weeks     Will not actively follow but will be available as needed     Subjective:      Ashley [name] is a 33 y.o. female who is being seen for pancreatitis.  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 She states that she was in her usual state of health until a few days ago when she began experiencing sharp achy abdominal pain most prominent in her epigastric area. The patient also reports nausea and vomiting of bilious liquid times multiple episodes prior to hospitalization. She reports feeling feverish with associated chills but denies any documented fever. She denies shortness of breath or chest pain. She reports a decreased appetite but denies weight loss. Her normal bowel pattern consist of daily to every other day firm to soft brown color stools that last BM yesterday noted as dark brown in color. She denies hematochezia or melena. She states that she significantly decreased her alcohol intake but has been consuming wine. She also reports having a toothache in which she has taken ibuprofen and Aleve and pain. Her most recent endoscopic evaluation as noted above. She denies any other complaints at this time.     Hospital Problems  Date Reviewed: May 11, 2018          Codes Class Noted POA    Acute pancreatitis ICD-10-CM: K85.90  ICD-9-CM: 577.0  08/28/2018 Unknown            Past Medical History:   Diagnosis Date   ??? Ill-defined condition     bronchitis   ??? Pneumonia      Past Surgical History:   Procedure Laterality Date   ??? HX CESAREAN SECTION  2002, 2004, 2008    x 3   ??? HX CESAREAN  SECTION     ??? [facility]   ??? H[facility]     ??? HX GASTRIC BYPASS        Family History   Problem Relation Age of Onset   ??? Diabetes Mother    ??? Hypertension Father    ??? Heart Disease Maternal Grandmother    ??? Heart Attack Maternal Grandmother    ??? High Cholesterol Maternal Grandmother    ??? Arthritis-osteo Maternal Grandmother    ??? Stroke Paternal Aunt    ??? Kidney Disease Maternal Aunt      Social History     Tobacco Use   ??? Smoking status: Former Smoker      Types: Cigarettes     Last attempt to quit: 11/12/2009     Years since quitting: 9.0   ??? Smokeless tobacco: Never Used   Substance Use Topics   ??? Alcohol use: Not Currently     Alcohol/week: 0.0 - 4.0 standard drinks     Comment: occassional      Current Facility-Administered Medications   Medication Dose Route Frequency Provider Last Rate Last Dose   ??? HYDROcodone-acetaminophen (NORCO) 5-325 mg per tablet 1-2 Tab  1-2 Tab Oral Q4H PRN Rande BruntKhan, Aamir, MD       ??? dextrose 5% - LR with KCl 20 mEq/L infusion   IntraVENous CONTINUOUS Rande BruntKhan, Aamir, MD       ??? ferrous sulfate tablet 325 mg  325 mg Oral DAILY WITH Noemi ChapelBREAKFAST Verma, Sourabh, MD   325 mg at 11/28/18 [id number]   ??? folic acid (FOLVITE) tablet 1 mg  1 mg Oral DAILY Roderic ScarceVerma, Sourabh, MD   1 mg at 11/28/18 [id number]   ??? omeprazole (PRILOSEC) capsule 20 mg  20 mg Oral BID Roderic ScarceVerma, Sourabh, MD   20 mg at 11/28/18 [id number]   ??? thiamine HCL (B-1) tablet 100 mg  100 mg Oral DAILY Roderic ScarceVerma, Sourabh, MD   100 mg at 11/28/18 [id number]   ??? naloxone (NARCAN) injection 0.1 mg  0.1 mg IntraVENous PRN Roderic ScarceVerma, Sourabh, MD       ??? ondansetron [facility](ZOFRAN) injection 4 mg  4 mg IntraVENous Q4H PRN Roderic ScarceVerma, Sourabh, MD   4 mg at 11/28/18 0859   ??? acetaminophen (TYLENOL) tablet 650 mg  650 mg Oral Q4H PRN Roderic ScarceVerma, Sourabh, MD        Or   ??? acetaminophen (TYLENOL) solution 650 mg  650 mg Oral Q4H PRN Roderic ScarceVerma, Sourabh, MD       ??? heparin (porcine) injection 5,000 Units  5,000 Units SubCUTAneous Q8H Roderic ScarceVerma, Sourabh, MD   5,000 Units at 11/28/18 [id number]   ??? cefTRIAXone (ROCEPHIN) 1 g in 0.9% sodium chloride (MBP/ADV) 50 mL MBP  1 g IntraVENous Q24H Roderic ScarceVerma, Sourabh, MD 100 mL/hr at 11/27/18 2337 1 g at 11/27/18 2337      Allergies   Allergen Reactions   ??? Aspirin Other (comments)     Stomach ulcer   ??? Ibuprofen Unknown (comments)     Gastric Bypass--advised to avoid   ??? Nsaids (Non-Steroidal Anti-Inflammatory Drug) Other (comments)     Hx gastric bypass     Review of Systems:   A 12 point review of systems were obtained with pertinent positives noted in above HPI, otherwise negative     Objective:     Patient Vitals for the past 24 hrs:   Temp Pulse Resp BP SpO2   11/28/18 1141 98.2 ??F (36.8 ??C) Marland Kitchen(!)  118 16 117/75 90 %   11/28/18 0808 97.9 ??F (36.6 ??C) 78 16 103/60 100 %   11/28/18 0503 98 ??F (36.7 ??C) 64 20 115/75 100 %   11/27/18 2330 98.8 ??F (37.1 ??C) (!) 55 20 115/72 100 %   11/27/18 2225 99.2 ??F (37.3 ??C) (!) 58 20 112/76 100 %   11/27/18 1933 ??? 72 19 105/78 99 %   11/27/18 1928 ??? 74 20 ??? 99 %     Physical Exam:  GENERAL: alert, pleasant, cooperative, no distress, appears stated age  EYE: No scleral icterus noted   LUNG: symmetric chest expansion, no wheeze, rhonchi or rales   HEART: tachycardic  ABDOMEN: soft, epigastric tenderness appreciated on palpation, bowel sounds active. No palpable masses, hernias or organomegaly appreciated   EXTREMITIES: no edema  SKIN: no rash or abnormalities  NEUROLOGIC: AOx3    Diagnostic Imaging:  Xr Chest Sngl V    Result Date: 11/27/2018  Clinical history: Abdominal pain EXAMINATION: Single view of the chest 11/27/2018 Correlation: 08/28/2018 FINDINGS: Trachea and heart size are within normal limits. Lungs are clear.     IMPRESSION: No acute pulmonary process.     Ct Abd Pelv W Cont    Result Date: 11/27/2018  Clinical history: Abdominal pain, vomiting EXAMINATION: CT scan abdomen and pelvis with intravenous and oral contrast 11/27/2018. 5 mm spiral scanning is performed from the costophrenic angles to the symphysis pubis. Coronal and sagittal reconstruction imaging has been obtained. Correlation: 08/28/2018 FINDINGS: Lung bases are clear. Degenerative changes of the spine. Liver demonstrates fatty infiltration. Gallbladder, spleen, adrenal glands, kidneys, uterus and adnexa are unremarkable. Peripancreatic inflammatory changes are present compatible with pancreatitis. No pancreatic or common bile duct dilatation. Bladder is  underdistended. Colon, terminal ileum and small bowel loops are within normal limits. Status post gastric bypass surgery.  Abdominal aorta is within normal limits. No dominant lymph node enlargement or free fluid.     IMPRESSION: 1. Acute pancreatitis. 2. Fatty infiltration of the liver. 3. Gastric bypass surgery.     Laboratory Results:    Labs: Results:   Chemistry Recent Labs     11/28/18  0334 11/27/18  1920   GLU 83 90   NA 140 139   K 4.2 4.1   CL 109* 107   CO2 26 24   BUN 6* 6*   CREA 0.6 0.7   CA 8.1* 8.7   AGAP 5 8   AP 150* 182*   TP 5.9* 6.9   ALB 2.5* 3.1*    CrCl cannot be calculated (Unknown ideal weight.).   CBC w/Diff Recent Labs     11/28/18  0334 11/27/18  2329 11/27/18  1920   WBC 6.3  --  7.3   RBC 3.39*  --  3.83   HGB 11.6* 11.6* 13.3   HCT 34.9* 35.1* 38.6   PLT 230  --  261   GRANS 54.7  --  68.8*   LYMPH 34.6  --  23.3*   EOS 0.9  --  0.7      Cardiac Enzymes No results for input(s): CPK, CKND1, MYO in the last 72 hours.    No lab exists for component: CKRMB, TROIP   Coagulation No results for input(s): PTP, INR, APTT, INREXT in the last 72 hours.    Hepatitis Panel Lab Results   Component Value Date/Time    Hepatitis A, IgM NON-REACTIVE 08/29/2018 02:35 AM    Hepatitis B surface Ag NON-REACTIVE 08/29/2018 02:35  AM    Hepatitis B core, IgM NON-REACTIVE 08/29/2018 02:35 AM    Hep B surface Ag screen Negative 12/20/2010 12:00 AM    Hepatitis C virus Ab NON-REACTIVE 08/29/2018 02:35 AM     Recent Labs     11/28/18  0334 11/27/18  1920   ALT 55 72   TBILI 0.8 0.5   AP 150* 182*   ALB 2.5* 3.1*   TP 5.9* 6.9         Amylase Lipase    Liver Enzymes Recent Labs     11/28/18  0334   TP 5.9*   ALB 2.5*   AP 150*   ALT 55      Thyroid Studies No results for input(s): T4, T3U, TSH, TSHEXT in the last 72 hours.    No lab exists for component: T3RU     Pathology pathology     Signed By: Myrla Halsted MS, APRN, FNP-C    November 28, 2018     Office: [phone]        I was personally available for consultation. I have reviewed patient's record and agree with the documentation recorded by the Physician Asst/Nurse Practitioner, including the assessment, treatment plan, and disposition.    Raynelle Jan, MD  November 28, 2018

## 2018-11-28 NOTE — Other (Signed)
Bedside shift change report given to Rosanne RN (oncoming nurse) by Rozell Searing (offgoing nurse). Report included the following information SBAR, Kardex, OR Summary, Procedure Summary, Intake/Output and Recent Results.

## 2018-11-28 NOTE — Progress Notes (Signed)
Attempted to do CM assessment. Called pt in room, no answer.

## 2018-11-28 NOTE — Progress Notes (Signed)
Bedside shift change report given to Wendy RN (oncoming nurse) by Rachel RN (offgoing nurse). Report included the following information SBAR, Kardex, Procedure Summary, Intake/Output, MAR and Recent Results.

## 2018-11-28 NOTE — Progress Notes (Addendum)
INTERNAL MEDICINE PROGRESS NOTE    Date of note:      November 28, 2018  Patient:               Ashley [name], 33 y.o., female  Admit Date:        11/27/2018  Length of Stay:  1 day(s)    Overnight/24-hour Events:    Reviewed.  Subjective:     Abdominal pain  Assessment:           1. Acute pancreatitis -suspect most likely from alcohol use.  She states that she has reduced amount of alcohol she drinks.  However she has had previous pancreatitis and is more susceptible to further episodes even if she drinks less.  CT Peripancreatic stranding noted on imaging.  Lipase 443.  AST 42 ALT 55 calcium 8.1 TG 117  2. Fatty liver noted on imaging -suspect from alcohol use  3. RUQ ultrasound 08/28/2018: Gallbladder wall polyp.  3.1 x 2.1 cm fluid collection again noted anterior to head of pancreas unchanged from February 2020  4. Questionable UTI??? positive nitrites small LE with 10-10 WBCs 2+ bacteria  5. History of Roux-en-Y gastric bypass  6. Obesity BMI 36  Plan:           ? Continue IV fluids  ? Continue pain control  ? Continue antiemetics  ? Continue Protonix IV  ? Trend hemoglobin and hematocrit  ? Hemoccult negative in the ED we will monitor for evidence of GI bleed.  Hemoglobin 11.6 this a.m.  ? On rocephine for UTI awaiting urine cultures  ? GI consulted   ? RUQ ultrasound pending    Diet: NPO  DVT ppx: SCDs    Total clinical care time is 35 minutes, including review of chart including all labs, radiology, past medical history, and discussion with patient and family. Greater than 50% of my time was spent in coordination of care and counseling     Disposition:               home  Objective:   BP 103/60 (BP 1 Location: Right arm, BP Patient Position: Supine)    Pulse 78    Temp 97.9 ??F (36.6 ??C)    Resp 16    SpO2 100%     General: Young female complaining of mild pain but no acute distress  Cardiac: Regular rate and rhythm  Lungs: Clear to auscultation   Abdomen: Tenderness in epigastric and right upper quadrant region without rebound or guarding.  Bowel sounds present  MSK: no edema  Psych: mood appropriate  Neuro: alert oriented x3. No FNDs   Current Medications:     Current Facility-Administered Medications:   ???  ferrous sulfate tablet 325 mg, 325 mg, Oral, DAILY WITH BREAKFAST, Charlaine Dalton, MD, 325 mg at 27/06/23 7628  ???  folic acid (FOLVITE) tablet 1 mg, 1 mg, Oral, DAILY, Charlaine Dalton, MD, 1 mg at 11/28/18 3151  ???  omeprazole (PRILOSEC) capsule 20 mg, 20 mg, Oral, BID, Charlaine Dalton, MD, 20 mg at 11/28/18 7616  ???  thiamine HCL (B-1) tablet 100 mg, 100 mg, Oral, DAILY, Charlaine Dalton, MD, 100 mg at 11/28/18 0737  ???  naloxone (NARCAN) injection 0.1 mg, 0.1 mg, IntraVENous, PRN, Charlaine Dalton, MD  ???  ondansetron [facility]) injection 4 mg, 4 mg, IntraVENous, Q4H PRN, Charlaine Dalton, MD, 4 mg at 11/28/18 0859  ???  acetaminophen (TYLENOL) tablet 650 mg, 650 mg, Oral, Q4H PRN **OR** acetaminophen (TYLENOL) solution  650 mg, 650 mg, Oral, Q4H PRN **OR** acetaminophen (TYLENOL) suppository 650 mg, 650 mg, Rectal, Q4H PRN, Charlaine Dalton, MD  ???  0.9% sodium chloride infusion, 150 mL/hr, IntraVENous, CONTINUOUS, Charlaine Dalton, MD, Last Rate: 150 mL/hr at 11/28/18 0832, 150 mL/hr at 11/28/18 0832  ???  HYDROcodone-acetaminophen (NORCO) 5-325 mg per tablet 1 Tab, 1 Tab, Oral, Q4H PRN, Charlaine Dalton, MD, 1 Tab at 11/28/18 7425  ???  heparin (porcine) injection 5,000 Units, 5,000 Units, SubCUTAneous, Q8H, Charlaine Dalton, MD, 5,000 Units at 11/28/18 9563  ???  cefTRIAXone (ROCEPHIN) 1 g in 0.9% sodium chloride (MBP/ADV) 50 mL MBP, 1 g, IntraVENous, Q24H, Charlaine Dalton, MD, Last Rate: 100 mL/hr at 11/27/18 2337, 1 g at 11/27/18 2337  Labs:     Recent Results (from the past 24 hour(s))   CBC WITH AUTOMATED DIFF    Collection Time: 11/27/18  7:20 PM   Result Value Ref Range    WBC 7.3 4.0 - 11.0 1000/mm3    RBC 3.83 3.60 - 5.20 M/uL    HGB 13.3 13.0 - 17.2 gm/dl     HCT 38.6 37.0 - 50.0 %    MCV 100.8 (H) 80.0 - 98.0 fL    MCH 34.7 (H) 25.4 - 34.6 pg    MCHC 34.5 30.0 - 36.0 gm/dl    PLATELET 261 140 - 450 1000/mm3    MPV 11.3 (H) 6.0 - 10.0 fL    RDW-SD 49.7 (H) 36.4 - 46.3      NRBC 0 0 - 0      IMMATURE GRANULOCYTES 0.3 0.0 - 3.0 %    NEUTROPHILS 68.8 (H) 34 - 64 %    LYMPHOCYTES 23.3 (L) 28 - 48 %    MONOCYTES 6.6 1 - 13 %    EOSINOPHILS 0.7 0 - 5 %    BASOPHILS 0.3 0 - 3 %   METABOLIC PANEL, COMPREHENSIVE    Collection Time: 11/27/18  7:20 PM   Result Value Ref Range    Sodium 139 136 - 145 mEq/L    Potassium 4.1 3.5 - 5.1 mEq/L    Chloride 107 98 - 107 mEq/L    CO2 24 21 - 32 mEq/L    Glucose 90 74 - 106 mg/dl    BUN 6 (L) 7 - 25 mg/dl    Creatinine 0.7 0.6 - 1.3 mg/dl    GFR est AA >60.0      GFR est non-AA >60      Calcium 8.7 8.5 - 10.1 mg/dl    AST (SGOT) 61 (H) 15 - 37 U/L    ALT (SGPT) 72 12 - 78 U/L    Alk. phosphatase 182 (H) 45 - 117 U/L    Bilirubin, total 0.5 0.2 - 1.0 mg/dl    Protein, total 6.9 6.4 - 8.2 gm/dl    Albumin 3.1 (L) 3.4 - 5.0 gm/dl    Anion gap 8 5 - 15 mmol/L   LIPASE    Collection Time: 11/27/18  7:20 PM   Result Value Ref Range    Lipase 487 (H) 73 - 393 U/L   POC HCG,URINE    Collection Time: 11/27/18  8:22 PM   Result Value Ref Range    HCG urine, QL negative NEGATIVE,Negative,negative     POC URINE MACROSCOPIC    Collection Time: 11/27/18  8:37 PM   Result Value Ref Range    Glucose Negative NEGATIVE,Negative mg/dl    Bilirubin Negative NEGATIVE,Negative  Ketone Negative NEGATIVE,Negative mg/dl    Specific gravity 1.025 1.005 - 1.030      Blood Trace-intact (A) NEGATIVE,Negative      pH (UA) 6.0 5 - 9      Protein Negative NEGATIVE,Negative mg/dl    Urobilinogen 0.2 0.0 - 1.0 EU/dl    Nitrites Positive (A) NEGATIVE,Negative      Leukocyte Esterase Small (A) NEGATIVE,Negative      Color Yellow      Appearance Clear     POC URINE MICROSCOPIC    Collection Time: 11/27/18  8:37 PM   Result Value Ref Range     Epithelial cells, squamous 15-29 /LPF    WBC 10-14 /HPF    RBC OCCASIONAL /HPF    Bacteria 2+ /HPF   HGB & HCT    Collection Time: 11/27/18 11:29 PM   Result Value Ref Range    HGB 11.6 (L) 13.0 - 17.2 gm/dl    HCT 35.1 (L) 37.0 - 50.0 %   CBC WITH AUTOMATED DIFF    Collection Time: 11/28/18  3:34 AM   Result Value Ref Range    WBC 6.3 4.0 - 11.0 1000/mm3    RBC 3.39 (L) 3.60 - 5.20 M/uL    HGB 11.6 (L) 13.0 - 17.2 gm/dl    HCT 34.9 (L) 37.0 - 50.0 %    MCV 102.9 (H) 80.0 - 98.0 fL    MCH 34.2 25.4 - 34.6 pg    MCHC 33.2 30.0 - 36.0 gm/dl    PLATELET 230 140 - 450 1000/mm3    MPV 11.2 (H) 6.0 - 10.0 fL    RDW-SD 50.5 (H) 36.4 - 46.3      NRBC 0 0 - 0      IMMATURE GRANULOCYTES 0.3 0.0 - 3.0 %    NEUTROPHILS 54.7 34 - 64 %    LYMPHOCYTES 34.6 28 - 48 %    MONOCYTES 9.2 1 - 13 %    EOSINOPHILS 0.9 0 - 5 %    BASOPHILS 0.3 0 - 3 %   METABOLIC PANEL, COMPREHENSIVE    Collection Time: 11/28/18  3:34 AM   Result Value Ref Range    Sodium 140 136 - 145 mEq/L    Potassium 4.2 3.5 - 5.1 mEq/L    Chloride 109 (H) 98 - 107 mEq/L    CO2 26 21 - 32 mEq/L    Glucose 83 74 - 106 mg/dl    BUN 6 (L) 7 - 25 mg/dl    Creatinine 0.6 0.6 - 1.3 mg/dl    GFR est AA >60.0      GFR est non-AA >60      Calcium 8.1 (L) 8.5 - 10.1 mg/dl    AST (SGOT) 42 (H) 15 - 37 U/L    ALT (SGPT) 55 12 - 78 U/L    Alk. phosphatase 150 (H) 45 - 117 U/L    Bilirubin, total 0.8 0.2 - 1.0 mg/dl    Protein, total 5.9 (L) 6.4 - 8.2 gm/dl    Albumin 2.5 (L) 3.4 - 5.0 gm/dl    Anion gap 5 5 - 15 mmol/L   LIPID PANEL    Collection Time: 11/28/18  3:34 AM   Result Value Ref Range    Cholesterol, total 163 140 - 199 mg/dl    HDL Cholesterol 48 40 - 96 mg/dl    Triglyceride 117 29 - 150 mg/dl    LDL, calculated 92 0 - 130 mg/dl  CHOL/HDL Ratio 3.4 0.0 - 4.4 Ratio   LIPASE    Collection Time: 11/28/18  3:34 AM   Result Value Ref Range    Lipase 443 (H) 73 - 393 U/L     Radiology   Xr Chest Sngl V    Result Date: 11/27/2018   Clinical history: Abdominal pain EXAMINATION: Single view of the chest 11/27/2018 Correlation: 08/28/2018 FINDINGS: Trachea and heart size are within normal limits. Lungs are clear.     IMPRESSION: No acute pulmonary process.     Ct Abd Pelv W Cont    Result Date: 11/27/2018  Clinical history: Abdominal pain, vomiting EXAMINATION: CT scan abdomen and pelvis with intravenous and oral contrast 11/27/2018. 5 mm spiral scanning is performed from the costophrenic angles to the symphysis pubis. Coronal and sagittal reconstruction imaging has been obtained. Correlation: 08/28/2018 FINDINGS: Lung bases are clear. Degenerative changes of the spine. Liver demonstrates fatty infiltration. Gallbladder, spleen, adrenal glands, kidneys, uterus and adnexa are unremarkable. Peripancreatic inflammatory changes are present compatible with pancreatitis. No pancreatic or common bile duct dilatation. Bladder is underdistended. Colon, terminal ileum and small bowel loops are within normal limits. Status post gastric bypass surgery.  Abdominal aorta is within normal limits. No dominant lymph node enlargement or free fluid.     IMPRESSION: 1. Acute pancreatitis. 2. Fatty infiltration of the liver. 3. Gastric bypass surgery.        Portions of this electronic record were dictated using Systems analyst. Unintended errors in translation may occur.      Holland Falling, MD  [facility]  November 28, 2018  9:14 AM

## 2018-11-29 LAB — COMPREHENSIVE METABOLIC PANEL
ALT: 42 U/L (ref 12–78)
AST: 33 U/L (ref 15–37)
Albumin: 2.4 gm/dl — ABNORMAL LOW (ref 3.4–5.0)
Alkaline Phosphatase: 143 U/L — ABNORMAL HIGH (ref 45–117)
Anion Gap: 7 mmol/L (ref 5–15)
BUN: 4 mg/dl — ABNORMAL LOW (ref 7–25)
CO2: 23 mEq/L (ref 21–32)
Calcium: 8.4 mg/dl — ABNORMAL LOW (ref 8.5–10.1)
Chloride: 105 mEq/L (ref 98–107)
Creatinine: 0.5 mg/dl — ABNORMAL LOW (ref 0.6–1.3)
EGFR IF NonAfrican American: >60
GFR African American: >60
Glucose: 67 mg/dl — ABNORMAL LOW (ref 74–106)
Potassium: 4 mEq/L (ref 3.5–5.1)
Sodium: 136 mEq/L (ref 136–145)
Total Bilirubin: 0.5 mg/dl (ref 0.2–1.0)
Total Protein: 6.3 gm/dl — ABNORMAL LOW (ref 6.4–8.2)

## 2018-11-29 LAB — CBC
Hematocrit: 35.1 % — ABNORMAL LOW (ref 37.0–50.0)
Hemoglobin: 11.7 gm/dl — ABNORMAL LOW (ref 13.0–17.2)
MCH: 34.3 pg (ref 25.4–34.6)
MCHC: 33.3 gm/dl (ref 30.0–36.0)
MCV: 102.9 fL — ABNORMAL HIGH (ref 80.0–98.0)
MPV: 11.7 fL — ABNORMAL HIGH (ref 6.0–10.0)
Platelets: 205 10*3/uL (ref 140–450)
RBC: 3.41 M/uL — ABNORMAL LOW (ref 3.60–5.20)
RDW-SD: 49.8 — ABNORMAL HIGH (ref 36.4–46.3)
WBC: 7.4 10*3/uL (ref 4.0–11.0)

## 2018-11-29 LAB — POCT GLUCOSE: POC Glucose: 77 mg/dL (ref 65–105)

## 2018-11-29 LAB — CBC W/O DIFF
HCT: 35.1 % — ABNORMAL LOW (ref 37.0–50.0)
HGB: 11.7 gm/dl — ABNORMAL LOW (ref 13.0–17.2)
MCH: 34.3 pg (ref 25.4–34.6)
MCHC: 33.3 gm/dl (ref 30.0–36.0)
MCV: 102.9 fL — ABNORMAL HIGH (ref 80.0–98.0)
MPV: 11.7 fL — ABNORMAL HIGH (ref 6.0–10.0)
PLATELET: 205 10*3/uL (ref 140–450)
RBC: 3.41 M/uL — ABNORMAL LOW (ref 3.60–5.20)
RDW-SD: 49.8 — ABNORMAL HIGH (ref 36.4–46.3)
WBC: 7.4 10*3/uL (ref 4.0–11.0)

## 2018-11-29 LAB — METABOLIC PANEL, COMPREHENSIVE
ALT (SGPT): 42 U/L (ref 12–78)
AST (SGOT): 33 U/L (ref 15–37)
Albumin: 2.4 gm/dl — ABNORMAL LOW (ref 3.4–5.0)
Alk. phosphatase: 143 U/L — ABNORMAL HIGH (ref 45–117)
Anion gap: 7 mmol/L (ref 5–15)
BUN: 4 mg/dl — ABNORMAL LOW (ref 7–25)
Bilirubin, total: 0.5 mg/dl (ref 0.2–1.0)
CO2: 23 mEq/L (ref 21–32)
Calcium: 8.4 mg/dl — ABNORMAL LOW (ref 8.5–10.1)
Chloride: 105 mEq/L (ref 98–107)
Creatinine: 0.5 mg/dl — ABNORMAL LOW (ref 0.6–1.3)
GFR est AA: >60
GFR est non-AA: >60
Glucose: 67 mg/dl — ABNORMAL LOW (ref 74–106)
Potassium: 4 mEq/L (ref 3.5–5.1)
Protein, total: 6.3 gm/dl — ABNORMAL LOW (ref 6.4–8.2)
Sodium: 136 mEq/L (ref 136–145)

## 2018-11-29 LAB — GLUCOSE, POC: Glucose (POC): 77 mg/dL (ref 65–105)

## 2018-11-29 MED ORDER — LACTATED RINGERS IV
INTRAVENOUS | Status: DC
Start: 2018-11-29 — End: 2018-11-30
  Administered 2018-11-29 – 2018-11-30 (×5): via INTRAVENOUS

## 2018-11-29 MED FILL — HYDROCODONE-ACETAMINOPHEN 5 MG-325 MG TAB: 5-325 mg | ORAL | Qty: 2

## 2018-11-29 MED FILL — ONDANSETRON (PF) 4 MG/2 ML INJECTION: 4 mg/2 mL | INTRAMUSCULAR | Qty: 2

## 2018-11-29 MED FILL — MORPHINE 2 MG/ML INJECTION: 2 mg/mL | INTRAMUSCULAR | Qty: 1

## 2018-11-29 MED FILL — LACTATED RINGERS IV: INTRAVENOUS | Qty: 1000

## 2018-11-29 MED FILL — HEPARIN (PORCINE) 5,000 UNIT/ML IJ SOLN: 5000 unit/mL | INTRAMUSCULAR | Qty: 1

## 2018-11-29 MED FILL — CEFTRIAXONE 1 GRAM SOLUTION FOR INJECTION: 1 gram | INTRAMUSCULAR | Qty: 1

## 2018-11-29 MED FILL — PANTOPRAZOLE 40 MG IV SOLR: 40 mg | INTRAVENOUS | Qty: 40

## 2018-11-29 NOTE — Progress Notes (Signed)
Progress  Notes by Holland Falling, MD at 11/29/18 1030                Author: Holland Falling, MD  Service: Hospitalist  Author Type: Physician       Filed: 11/29/18 1544  Date of Service: 11/29/18 1030  Status: Signed          Editor: Holland Falling, MD (Physician)                       INTERNAL MEDICINE PROGRESS NOTE      Date of note:      November 29, 2018   Patient:               Ashley [name] , 33 y.o., female   Admit Date:        11/27/2018   Length of Stay:  2 day(s)        Overnight/24-hour Events:      Reviewed.     Subjective:       Abdominal pain     Assessment:             1.  Acute pancreatitis -suspect most likely from alcohol use.  She states that she has reduced amount of alcohol she drinks.  However she has had  previous pancreatitis and is more susceptible to further episodes even if she drinks less.  CT Peripancreatic stranding noted on imaging.  Lipase 443.  AST 42 ALT 55 calcium 8.1 TG 117   2.  Fatty liver noted on imaging -suspect from alcohol use   3.  Macrocytic anemia - suspect secondary to EtOH. B12 wnl. Iron / Ferritin WNL. TIBC slightly low.    4.  5 mm gallbladder polyp and gallbladder sludge noted on ultrasound 9/16.  Pericholecystic fluid.  Murphy sign negative.     RUQ ultrasound 08/28/2018: Gallbladder wall polyp.  3.1 x 2.1 cm fluid collection again noted anterior to head of pancreas unchanged from February 2020   5.  Questionable UTI- positive nitrites small LE with 10-10 WBCs 2+ bacteria   6.  History of Roux-en-Y gastric bypass   7.  Obesity BMI 36   8.  Hx of possible duodenal ulcer perforation with possible abscess     Plan:             ??  Continue IV fluids   ??  Continue pain control -stop morphine and continue p.o. pain meds   ??  Continue antiemetics   ??  Continue Protonix IV   ??  Trend hemoglobin and hematocrit   ??  Hemoccult negative in the ED we will monitor for evidence of GI bleed.  Hemoglobin 11.7 this a.m.   ??  Urine cultures growing Klebsiella only resistant to  ampicillin.  Continue on rocephine for UTI    ??  GI consulted -we will need close outpatient follow-up with GI   ??  Continue thiamine, folic acid and PI   ??  General surgery consulted given 5 mm gallbladder polyp with lateral sludge.  Consider elective cholecystectomy once acute pancreatitis is resolved during this hospital stay or during outpatient setting      Diet: Clear liquids   DVT ppx: SCDs        Total clinical care time is 30 minutes, including review of chart including all labs, radiology, past medical history, and discussion with patient and  family. Greater than 50% of my time was spent in coordination of care  and counseling          Disposition:                 home     Objective:     BP 108/71 (BP 1 Location: Left arm, BP Patient Position: Supine)    Pulse 78    Temp 98.9 ??F (37.2 ??C)    Resp 16    SpO2 99%       General: Young female complaining of mild pain but no acute distress   Cardiac: Regular rate and rhythm   Lungs: Clear to auscultation   Abdomen: Tenderness in epigastric and right upper quadrant region without rebound or guarding.  Bowel sounds present   MSK: no edema   Psych: mood appropriate   Neuro: alert oriented x3. No FNDs      Current Medications:        Current Facility-Administered Medications:    ?  lactated Ringers infusion, 150 mL/hr, IntraVENous, CONTINUOUS, Holland Falling, MD, Last Rate: 150 mL/hr at 11/29/18 0815, 150 mL/hr at 11/29/18 0815   ?  HYDROcodone-acetaminophen (NORCO) 5-325 mg per tablet 1-2 Tab, 1-2 Tab, Oral, Q4H PRN, Holland Falling, MD, 2 Tab at 11/28/18 2351   ?  therapeutic multivitamin-minerals (THERAGRAN-M) tablet 1 Tab, 1 Tab, Oral, DAILY, Dixon, Chantel D, NP   ?  pantoprazole (PROTONIX) 40 mg in 0.9% sodium chloride 10 mL injection, 40 mg, IntraVENous, DAILY, Dixon, Chantel D, NP, 40 mg at 11/29/18 0820   ?  ferrous sulfate tablet 325 mg, 325 mg, Oral, DAILY WITH BREAKFAST, Charlaine Dalton, MD, 325 mg at 11/28/18 1610   ?  folic acid (FOLVITE) tablet 1 mg, 1 mg,  Oral, DAILY, Charlaine Dalton, MD, 1 mg at 11/28/18 9604   ?  thiamine HCL (B-1) tablet 100 mg, 100 mg, Oral, DAILY, Charlaine Dalton, MD, 100 mg at 11/28/18 5409   ?  naloxone [facility]) injection 0.1 mg, 0.1 mg, IntraVENous, PRN, Charlaine Dalton, MD   ?  ondansetron [facility]) injection 4 mg, 4 mg, IntraVENous, Q4H PRN, Charlaine Dalton, MD, 4 mg at 11/29/18 0603   ?  acetaminophen (TYLENOL) tablet 650 mg, 650 mg, Oral, Q4H PRN **OR** acetaminophen (TYLENOL) solution 650 mg, 650 mg, Oral, Q4H PRN **OR** [DISCONTINUED] acetaminophen (TYLENOL) suppository 650 mg, 650 mg, Rectal, Q4H PRN, Charlaine Dalton, MD   ?  heparin (porcine) injection 5,000 Units, 5,000 Units, SubCUTAneous, Q8H, Charlaine Dalton, MD, 5,000 Units at 11/29/18 0700   ?  cefTRIAXone (ROCEPHIN) 1 g in 0.9% sodium chloride (MBP/ADV) 50 mL MBP, 1 g, IntraVENous, Q24H, Charlaine Dalton, MD, Last Rate: 100 mL/hr at 11/28/18 2214, 1 g at 11/28/18 2214     Labs:          Recent Results (from the past 24 hour(s))     HGB & HCT          Collection Time: 11/28/18 12:21 PM         Result  Value  Ref Range            HGB  11.6 (L)  13.0 - 17.2 gm/dl       HCT  35.7 (L)  37.0 - 50.0 %       CBC W/O DIFF          Collection Time: 11/29/18  6:08 AM         Result  Value  Ref Range            WBC  7.4  4.0 - 11.0 1000/mm3       RBC  3.41 (L)  3.60 - 5.20 M/uL       HGB  11.7 (L)  13.0 - 17.2 gm/dl       HCT  35.1 (L)  37.0 - 50.0 %       MCV  102.9 (H)  80.0 - 98.0 fL       MCH  34.3  25.4 - 34.6 pg       MCHC  33.3  30.0 - 36.0 gm/dl       PLATELET  205  140 - 450 1000/mm3       MPV  11.7 (H)  6.0 - 10.0 fL       RDW-SD  49.8 (H)  36.4 - 93.8         METABOLIC PANEL, COMPREHENSIVE          Collection Time: 11/29/18  6:08 AM         Result  Value  Ref Range            Sodium  136  136 - 145 mEq/L       Potassium  4.0  3.5 - 5.1 mEq/L       Chloride  105  98 - 107 mEq/L       CO2  23  21 - 32 mEq/L       Glucose  67 (L)  74 - 106 mg/dl       BUN  4 (L)  7 - 25 mg/dl        Creatinine  0.5 (L)  0.6 - 1.3 mg/dl       GFR est AA  >60.0          GFR est non-AA  >60          Calcium  8.4 (L)  8.5 - 10.1 mg/dl       AST (SGOT)  33  15 - 37 U/L       ALT (SGPT)  42  12 - 78 U/L       Alk. phosphatase  143 (H)  45 - 117 U/L       Bilirubin, total  0.5  0.2 - 1.0 mg/dl       Protein, total  6.3 (L)  6.4 - 8.2 gm/dl       Albumin  2.4 (L)  3.4 - 5.0 gm/dl       Anion gap  7  5 - 15 mmol/L       GLUCOSE, POC          Collection Time: 11/29/18  7:50 AM         Result  Value  Ref Range            Glucose (POC)  77  65 - 105 mg/dL          Radiology     [employer] [facility]      Result Date: 11/28/2018   INDICATION: prior imaging showing fluid collection    PROCEDURE: [place] [facility] COMPARISON: 11/27/2018 FINDINGS: The liver is echogenic and measures 18.9 cm. 5 mm gallbladder polyp and also gallbladder sludge. Pericholecystic fluid. Murphy's sign is negative.  The common bile duct measures 5 mm. Gallbladder wall thickness is 1 mm. Ultrasound appearance of the pancreas is normal but by CT there is known pancreatitis. The aorta and inferior vena cava show normal diameters. The right kidney measures 9.5 x 5.1  x  4.9 cm.       IMPRESSION: 1. 5 mm gallbladder polyp and gallbladder sludge. Pericholecystic fluid. Murphy's sign is negative. 2. Known pancreatitis by CT. No intrinsic abnormality of the pancreas by ultrasound. 3. Fatty infiltration of the liver.           Portions of this electronic record were dictated using Systems analyst. Unintended errors in translation may occur.         Holland Falling, MD   [facility]   November 29, 2018   9:14 AM

## 2018-11-29 NOTE — Consults (Signed)
Consults by Tanda Rockers, MD at 11/29/18 2220                Author: Tanda Rockers, MD  Service: General Surgery  Author Type: Physician       Filed: 11/30/18 0116  Date of Service: 11/29/18 2220  Status: Addendum          Editor: Tanda Rockers, MD (Physician)          Related Notes: Original Note by Tanda Rockers, MD (Physician) filed at 11/30/18 [facility]   General Surgery    [phone]      CONSULT NOTE      Today's date: November 29, 2018   Patient: Ashley [name]                  Sex: female         Date of Birth:  [birth year]      Age:  33 y.o.           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  The patient continues  to have some epigastric pain that radiates to her back, but this has improved since admission and does not appear to be exacerbated by a small amount of po intake.  She is tender on exam, but remains afebrile and HD WNL.  WBC normal, alk phos elevated.   CT/US images reviewed.       Although her recurrent episodes of pancreatitis could be from continued EtOH use, a biliary source is as likely and cholecystectomy before discharge is warranted once she is non-tender as the patient has had difficulty scheduling this procedure as an  outpatient.  The importance of EtOH abstinence to avoid future, possibly more severe, episodes of pancreatitis as well as to avoid complications from her gastric bypass surgery was discussed in detail and the patient voices understanding.  Would continue  clear liquid diet for now and patient encouraged not to eat the [place] currently at the bedside.  Will follow along with you and plan for laparoscopic cholecystectomy once her exam improves,  likely in the next day or so.  Thank you for this consult  and the opportunity to participate in this patient's care.                Chief complaint:     Abdominal pain        HPI:     Ashley [name] is a 33 y.o.  female who is being seen in consult with Dr. Humphrey Rolls for evaluation of pancreatitis.  The patient reports three episodes of severe epigastric pain  (JAN, JUN, current) that led her to seek treatment at [facility]  She states that her pain has been associated with nausea and radiates to her back.  The patient was found to have gallbladder sludge as well as a small polyp on a previous admission and was  evaluated with recommendations for elective cholecystectomy, however the patient has had difficulty arranging this in the outpatient setting. She does report drinking wine as listed  below, but not to excess, and did do so prior to her episodes of pancreatitis.   She continues to have some waxing and waning epigastric pain, but denies fever, chills, chest pain, SOB, palpitations, emesis, melena, hematochezia, or changes to her bowel or bladder habits.         Past Medical History:          Past Medical History:        Diagnosis  Date         ?  Ill-defined condition            bronchitis         ?  Pneumonia               Past Surgical History:          Past Surgical History:         Procedure  Laterality  Date          ?  HX CESAREAN SECTION    2002, 2004, 2008          x 3          ?  HX CESAREAN SECTION         ?  HX GASTRIC BYPASS    [place]          ?  HX GASTRIC BYPASS              ?  HX GASTRIC BYPASS                       Family History         Problem  Relation  Age of Onset          ?  Diabetes  Mother       ?  Hypertension  Father       ?  Heart Disease  Maternal Grandmother       ?  Heart Attack  Maternal Grandmother       ?  High Cholesterol  Maternal Grandmother       ?  Arthritis-osteo  Maternal Grandmother       ?  Stroke  Paternal Aunt            ?  Kidney Disease  Maternal Aunt                Social History          Socioeconomic History         ?  Marital status:  SINGLE              Spouse name:  Not on file         ?  Number of children:  Not on file     ?  Years of education:  Not on file     ?  Highest education level:  Not on file       Tobacco Use         ?  Smoking status:  Former Smoker              Types:  Cigarettes         Last attempt to quit:  11/12/2009         Years since quitting:  9.0         ?  Smokeless tobacco:  Never Used       Substance and Sexual  Activity         ?  Alcohol use:  Not Currently              Alcohol/week:  0.0 - 4.0 standard drinks             Comment: occassional         ?  Drug use:  No     ?  Sexual activity:  Yes              Partners:  Male         Birth control/protection:  None       Social History Narrative          ** Merged History Encounter **                        Prior to Admission medications             Medication  Sig  Start Date  End Date  Taking?  Authorizing Provider            ferrous sulfate 325 mg (65 mg iron) tablet  Take 1 Tab by mouth daily (with breakfast). Indications: anemia from inadequate iron  04/12/18    Yes  Marzella Schlein, MD     ondansetron (Zofran ODT) 4 mg disintegrating tablet  Take 1 Tab by mouth every eight (8) hours as needed for Nausea.  08/31/18      Simoes, Legrand Como, MD     omeprazole (PRILOSEC) 20 mg capsule  Take 1 Cap by mouth two (2) times a day. Please open capsule and take granules with apple sauce  08/31/18      Simoes, Ruchita S, MD     folic acid (FOLVITE) 1 mg tablet  Take 1 Tab by mouth daily.  09/01/18      Simoes, Ruchita S, MD     thiamine HCL (B-1) 100 mg tablet  Take 1 Tab by mouth daily.  09/01/18      Simoes, Ruchita S, MD     cephALEXin (Keflex) 500 mg capsule  Take 1 Cap by mouth three (3) times daily.  08/31/18      Simoes, Legrand Como, MD            acetaminophen (TYLENOL) 325 mg tablet  Take 2 Tabs by mouth every six (6) hours as needed for Pain.  01/20/18      Decoursey Loffler, PA-C              Allergies        Allergen  Reactions         ?  Aspirin  Other (comments)             Stomach ulcer         ?  Ibuprofen  Unknown (comments)             Gastric Bypass--advised to avoid         ?  Nsaids (Non-Steroidal Anti-Inflammatory Drug)  Other (comments)             Hx gastric bypass             Review of Systems:     Review of Systems   Constitutional:  No fever, chills, or weight loss   Eyes: No visual symptoms.   ENT: No sore throat, runny nose or ear pain.   Respiratory: No cough, dyspnea or wheezing.   Cardiovascular: No chest  pain, pressure, palpitations, tightness or heaviness.   Gastrointestinal: No vomiting, diarrhea or abdominal pain   Genitourinary: No dysuria, frequency, or urgency.   Musculoskeletal: No joint pain    Integumentary: No skin lesions   Neurological: No headaches, sensory or motor symptoms.   Denies complaints in all other systems        Physical Exam:         Visit Vitals      BP  108/73 (BP 1 Location: Left arm, BP Patient Position: Supine)        Pulse  68     Temp  98.1 ??F (36.7 ??C)     Resp  18        SpO2  98%           Physical Exam:    Gen: Conversant, alert, oriented. No acute distress.                         HEENT: NC, AT. Mucous membranes moist. EOMI. No scleral icterus.                         Neck:  Supple                         MSE: Grossly intact                         EXT:  MAE with purpose.  No deformity                         Pulmonary: Clear to auscultation                         Cardiac: Regular rate and rhythm                         Abdomen:  Soft, obese, but non-distended with BS present.  The patient is TTP in the epigastrium without rebound or guarding.  There is some TTP in the RUQ.  A Murphy's sign was not observed.                          NEURO:  Follows commands.  Grossly intact.         Labs:           Labs:  Results:        Chemistry  Recent Labs         11/29/18   0608  11/28/18   0334  11/27/18   1920      GLU  67*  83  90      NA  136  140  139       K  4.0  4.2  4.1      CL  105  109*  107      CO2  _0 BUN  4*  6*  6*      CREA  0.5*  0.6  0.7      CA  8.4*  8.1*  8.7      AGAP  _1 AP  143*  150*  182*      TP  6.3*  5.9*  6.9      ALB  2.4*  2.5*  3.1*          CrCl cannot be calculated (Unknown ideal weight.).     CBC w/Diff  Recent Labs         11/29/18   0608  11/28/18   1221  11/28/18   0334    11/27/18   1920      WBC  7.4   --   6.3   --   7.3      RBC  3.41*   --   3.39*   --   3.83      HGB  11.7*  11.6*  11.6*    < >  13.3      HCT  35.1*  35.7*  34.9*    < >  38.6      PLT  205   --   230   --   261      GRANS   --    --   54.7   --   68.8*      LYMPH   --    --   34.6   --   23.3*      EOS   --    --   0.9   --   0.7       < > = values in this interval not displayed.              Coagulation  No results for input(s): PTP, INR, APTT, INREXT in the last 72 hours.         Liver Enzymes  Recent Labs         11/29/18   0608      TP  6.3*      ALB  2.4*      AP  143*      ALT  42                   Images:        No results found.               Konrad Felix, MD  General Surgery   Cell - [phone]   Pager - [phone]      November 29, 2018   10:20 PM

## 2018-11-29 NOTE — Progress Notes (Signed)
 Patient admitted on 11/27/2018 from home with   Chief Complaint   Patient presents with   . Abdominal Pain          The patient is being treated for    PMH:   Past Medical History:   Diagnosis Date   . Ill-defined condition     bronchitis   . Pneumonia         Treatment Team: Treatment Team: Attending Provider: Fernand Ruffing, MD; Consulting Provider: Lonnie Passy, MD; Consulting Provider: Fernand Ruffing, MD      The patient has been admitted to the hospital 2 times in the past 12 months.    Previous 4 Admission Dates Admission and Discharge Diagnosis Interventions Barriers Disposition   08-28-18 to 08-31-2018 Pancreatitis      04-03-18 to 04-12-18 Gastritis                       Patient and Family/Caregivers Goals of Care: to feel better and go home     Caregivers Participating in Plan of Care/Discharge Plan with the patient: patient, Case Manager    Tentative dc plan: home with home health     Anticipated DME needs for discharge: n/a    PRESCREENING COMPLETED FOR SNF  n/a    Does the patient have appropriate clothing available to be worn at discharge? yes      The patient and care participants are willing to travel n/a area for discharge facility.  The patient and plan of care participants have been provided with a list of all available Rehab Facilities or Home Health agencies as applicable. CM will follow up with a list of facilities or agencies that are offer acceptance.    CM has disclosed any financial interest that [facility] may have with any facility or agency.    Anticipated Discharge Date: 11/30/2018    The plan of care and discharge plan has been discussed with Fernand Ruffing, MD and all other appropriate providers and adjusted per interdisciplinary team recommendations and in discussion with the patient and the patient designated Care Plan Participants.    Barriers to Healthcare Success/ Readmission Risk Factors: n/a    Consults:  Palliative Care Consult Recommended: n/a  Transitional Care Clinic Referral:  n/a  Transitional Nurse Navigator Referral: n/a  Oncology Navigator Referral: n/a  SW consulted: n/a  [facility] Consulted: n/a  Outside Hospital/Community Resources Referrals and Collaboration: n/a    Food/Nutrition Needs:   n/a                   Dietician Consulted: n/a    RRAT Score: Low Risk            8       Total Score        4 IP Visits Last 12 Months (1-3=4, 4=9, >4=11)    4 Pt. Coverage (Medicare=5 , Medicaid, or Self-Pay=4)        Criteria that do not apply:    Has Seen PCP in Last 6 Months (Yes=3, No=0)    Married. Living with Significant Other. Assisted Living. LTAC. SNF. or   Rehab    Patient Length of Stay (>5 days = 3)    Charlson Comorbidity Score (Age + Comorbid Conditions)           PCP: Laurita, Da, MD . How do you get to your doctor appointments?    Specialists: n/a    Dialysis Unit: n/a  [facility]. Are there any medications that you have trouble paying for? No  Any difficulty getting your medications? No     DME available at Home:n/a    Home O2 L Flow:  n/a            Home O2 Provider: n/a    Home Environment and Prior Level of Function: Lives at [address]  [place] [address] @HOMEPHONE @ [phone]. Lives with Boyfriend .  Multistory  4  Steps into home.  Responsibilities at home include independent with ADL'S    Prior to admission open services: n/a    Home Health Agency n/a  Personal Care Agency n/a    Extended Emergency Contact Information  Primary Emergency Contact: Harrell,Derrick   [place]  Home Phone: [phone]  Mobile Phone: [phone]  Relation: Boyfriend  Secondary Emergency Contact: SIGNA RAISIN  Mobile Phone: [phone]  Relation: Friend     Transportation: boyfriend  will transport home    Therapy Recommendations:    OT = n/a    PT = n/a    SLP =  n/a     RT Home O2 Evaluation =  n/a    Wound Care =  n/a    Case Management Assessment    ABUSE/NEGLECT SCREENING   Physical Abuse/Neglect: Denies   Sexual Abuse:  Denies   Sexual Abuse: Denies   Other Abuse/Issues: Denies          PRIMARY DECISION MAKER        self                           CARE MANAGEMENT INTERVENTIONS   Readmission Interview Completed: Not Applicable   PCP Verified by CM: Yes(Dr Zhang, Da)           Mode of Transport at Discharge: Other (see comment)(Boyfriend)       Transition of Care Consult (CM Consult): Discharge Planning               Discharge Durable Medical Equipment: No   Physical Therapy Consult: No   Occupational Therapy Consult: No   Speech Therapy Consult: No   Current Support Network: Other(lives with boyfriend )   Reason for Referral: DCP Rounds   History Provided By: Patient   Patient Orientation: Alert and Oriented, Person, Place, Situation, Self   Cognition: Alert   Support System Response: Cooperative   Previous Living Arrangement: Other (see comment)(lives with boyfriend )   Home Accessibility: Steps(4)   Prior Functional Level: Independent in ADLs/IADLs   Current Functional Level: Independent in ADLs/IADLs       Can patient return to prior living arrangement: Yes   Ability to make needs known:: Good   Family able to assist with home care needs:: Yes                   Anticipated Discharge Needs: Other (see comment)(none at this time)                      DISCHARGE LOCATION   Discharge Placement: Home

## 2018-11-29 NOTE — Progress Notes (Signed)
Progressing towards goals

## 2018-11-29 NOTE — Progress Notes (Signed)
Patient admitted on 11/27/2018 from home with   Chief Complaint   Patient presents with   ??? Abdominal Pain          The patient is being treated for    PMH:   Past Medical History:   Diagnosis Date   ??? Ill-defined condition     bronchitis   ??? Pneumonia         Treatment Team: Treatment Team: Attending Provider: Rande BruntKhan, Aamir, MD; Consulting Provider: Roderic ScarceVerma, Sourabh, MD; Consulting Provider: Rande BruntKhan, Aamir, MD      The patient has been admitted to the hospital 2 times in the past 12 months.    Previous 4 Admission Dates Admission and Discharge Diagnosis Interventions Barriers Disposition   08-28-18 to 08-31-2018 Pancreatitis      04-03-18 to 04-12-18 Gastritis                       Patient and Family/Caregivers Goals of Care: to feel better and go home     Caregivers Participating in Plan of Care/Discharge Plan with the patient: patient, Case Manager    Tentative dc plan: home with home health     Anticipated DME needs for discharge: n/a    PRESCREENING COMPLETED FOR SNF  n/a    Does the patient have appropriate clothing available to be worn at discharge? yes      The patient and care participants are willing to travel n/a area for discharge facility.  The patient and plan of care participants have been provided with a list of all available Rehab Facilities or Home Health agencies as applicable. CM will follow up with a list of facilities or agencies that are offer acceptance.    CM has disclosed any financial interest that [facility] may have with any facility or agency.    Anticipated Discharge Date: 11/30/2018    The plan of care and discharge plan has been discussed with Rande BruntKhan, Aamir, MD and all other appropriate providers and adjusted per interdisciplinary team recommendations and in discussion with the patient and the patient designated Care Plan Participants.    Barriers to Healthcare Success/ Readmission Risk Factors: n/a    Consults:  Palliative Care Consult Recommended: n/a  Transitional Care Clinic Referral: n/a   Transitional Nurse Navigator Referral: n/a  Oncology Navigator Referral: n/a  SW consulted: n/a  [facility] Consulted: n/a  Outside Hospital/Community Resources Referrals and Collaboration: n/a    Food/Nutrition Needs:   n/a                   Dietician Consulted: n/a    RRAT Score: Low Risk            8       Total Score        4 IP Visits Last 12 Months (1-3=4, 4=9, >4=11)    4 Pt. Coverage (Medicare=5 , Medicaid, or Self-Pay=4)        Criteria that do not apply:    Has Seen PCP in Last 6 Months (Yes=3, No=0)    Married. Living with Significant Other. Assisted Living. LTAC. SNF. or   Rehab    Patient Length of Stay (>5 days = 3)    Charlson Comorbidity Score (Age + Comorbid Conditions)           PCP: Chipper HerbZhang, Da, MD . How do you get to your doctor appointments?    Specialists: n/a    Dialysis Unit: n/a  [facility]. Are there any medications that you have trouble paying for? No  Any difficulty getting your medications? No     DME available at Home:n/a    Home O2 L Flow:  n/a            Home O2 Provider: [place] and Prior Level of Function: Lives at [address]  [place] [address] @HOMEPHONE @ [phone]. Lives with Boyfriend .  Multistory  4  Steps into home.  Responsibilities at home include independent with ADL'S    Prior to admission open services: [place] n/a    Extended Emergency Contact Information  Primary Emergency Contact: [place] Phone: [phone]  Mobile Phone: [phone]  Relation: Boyfriend  Secondary Emergency Contact: Laverna Peace  Mobile Phone: [phone]  Relation: Friend     Transportation: boyfriend  will transport home    Therapy Recommendations:    OT = n/a    PT = n/a    SLP =  n/a     RT Home O2 Evaluation =  n/a    Wound Care =  n/a    Case Management Assessment    ABUSE/NEGLECT SCREENING   Physical Abuse/Neglect: Denies    Sexual Abuse: Denies   Sexual Abuse: Denies   Other Abuse/Issues: Denies          PRIMARY DECISION MAKER        self                           CARE MANAGEMENT INTERVENTIONS   Readmission Interview Completed: Not Applicable   PCP Verified by CM: Yes(Dr Zhang, Da)           Mode of Transport at Discharge: Other (see comment)(Boyfriend)       Transition of Care Consult (CM Consult): Discharge Planning               Discharge Durable Medical Equipment: No   Physical Therapy Consult: No   Occupational Therapy Consult: No   Speech Therapy Consult: No   Current Support Network: Other(lives with boyfriend )   Reason for Referral: DCP Rounds   History Provided By: Patient   Patient Orientation: Alert and Oriented, Person, Place, Situation, Self   Cognition: Alert   Support System Response: Cooperative   Previous Living Arrangement: Other (see comment)(lives with boyfriend )   Home Accessibility: Steps(4)   Prior Functional Level: Independent in ADLs/IADLs   Current Functional Level: Independent in ADLs/IADLs       Can patient return to prior living arrangement: Yes   Ability to make needs known:: Good   Family able to assist with home care needs:: Yes                   Anticipated Discharge Needs: Other (see comment)(none at this time)                      DISCHARGE LOCATION   Discharge Placement: Home

## 2018-11-29 NOTE — H&P (View-Only) (Signed)
[facility]  General Surgery   [id number]    CONSULT NOTE    Today's date: November 29, 2018  Patient: Ashley [name]                 Sex: female        Date of Birth:  08-07-1985      Age:  33 y.o.          DOA: 11/27/2018  LOS:  LOS: 2 days       Consult requested by: Holland Falling, MD    Assessment/Plan   33 yo female with pericholecystic fluid and gallbladder sludge in the setting of recurrent pancreatitis, third episode requiring hospitalization since JAN 2020.  The patient continues to have some epigastric pain that radiates to her back, but this has improved since admission and does not appear to be exacerbated by a small amount of po intake.  She is tender on exam, but remains afebrile and HD WNL.  WBC normal, alk phos elevated.  CT/US images reviewed.     Although her recurrent episodes of pancreatitis could be from continued EtOH use, a biliary source is as likely and cholecystectomy before discharge is warranted once she is non-tender as the patient has had difficulty scheduling this procedure as an outpatient.  The importance of EtOH abstinence to avoid future, possibly more severe, episodes of pancreatitis as well as to avoid complications from her gastric bypass surgery was discussed in detail and the patient voices understanding.  Would continue clear liquid diet for now and patient encouraged not to eat the [place] currently at the bedside.  Will follow along with you and plan for laparoscopic cholecystectomy once her exam improves, likely in the next day or so.  Thank you for this consult and the opportunity to participate in this patient's care.            Chief complaint:   Abdominal pain    HPI:   Ashley [name] is a 33 y.o. female who is being seen in consult with Dr. Humphrey Rolls for evaluation of pancreatitis.  The patient reports three episodes of severe epigastric pain (JAN, JUN, current) that led her to seek  treatment at [facility].  She states that her pain has been associated with nausea and radiates to her back.  The patient was found to have gallbladder sludge as well as a small polyp on a previous admission and was evaluated with recommendations for elective cholecystectomy, however the patient has had difficulty arranging this in the outpatient setting. She does report drinking wine as listed below, but not to excess, and did do so prior to her episodes of pancreatitis.  She continues to have some waxing and waning epigastric pain, but denies fever, chills, chest pain, SOB, palpitations, emesis, melena, hematochezia, or changes to her bowel or bladder habits.     Past Medical History:     Past Medical History:   Diagnosis Date   ??? Ill-defined condition     bronchitis   ??? Pneumonia        Past Surgical History:     Past Surgical History:   Procedure Laterality Date   ??? HX CESAREAN SECTION  2002, 2004, 2008    x 3   ??? HX CESAREAN SECTION     ??? HX GASTRIC BYPASS  10-2009    [facility]   ??? HX GASTRIC BYPASS     ??? HX GASTRIC BYPASS  Family History   Problem Relation Age of Onset   ??? Diabetes Mother    ??? Hypertension Father    ??? Heart Disease Maternal Grandmother    ??? Heart Attack Maternal Grandmother    ??? High Cholesterol Maternal Grandmother    ??? Arthritis-osteo Maternal Grandmother    ??? Stroke Paternal Aunt    ??? Kidney Disease Maternal Aunt        Social History     Socioeconomic History   ??? Marital status: SINGLE     Spouse name: Not on file   ??? Number of children: Not on file   ??? Years of education: Not on file   ??? Highest education level: Not on file   Tobacco Use   ??? Smoking status: Former Smoker     Types: Cigarettes     Last attempt to quit: 11/12/2009     Years since quitting: 9.0   ??? Smokeless tobacco: Never Used   Substance and Sexual Activity   ??? Alcohol use: Not Currently     Alcohol/week: 0.0 - 4.0 standard drinks     Comment: occassional   ??? Drug use: No   ??? Sexual activity: Yes     Partners: Male      Birth control/protection: None   Social History Narrative    ** Merged History Encounter **            Prior to Admission medications    Medication Sig Start Date End Date Taking? Authorizing Provider   ferrous sulfate 325 mg (65 mg iron) tablet Take 1 Tab by mouth daily (with breakfast). Indications: anemia from inadequate iron 04/12/18  Yes Marzella Schlein, MD   ondansetron (Zofran ODT) 4 mg disintegrating tablet Take 1 Tab by mouth every eight (8) hours as needed for Nausea. 08/31/18   Simoes, Legrand Como, MD   omeprazole (PRILOSEC) 20 mg capsule Take 1 Cap by mouth two (2) times a day. Please open capsule and take granules with apple sauce 08/31/18   Simoes, Ruchita S, MD   folic acid (FOLVITE) 1 mg tablet Take 1 Tab by mouth daily. 09/01/18   Simoes, Ruchita S, MD   thiamine HCL (B-1) 100 mg tablet Take 1 Tab by mouth daily. 09/01/18   Simoes, Ruchita S, MD   cephALEXin (Keflex) 500 mg capsule Take 1 Cap by mouth three (3) times daily. 08/31/18   Simoes, Legrand Como, MD   acetaminophen (TYLENOL) 325 mg tablet Take 2 Tabs by mouth every six (6) hours as needed for Pain. 01/20/18   Couillard Loffler, PA-C       Allergies   Allergen Reactions   ??? Aspirin Other (comments)     Stomach ulcer   ??? Ibuprofen Unknown (comments)     Gastric Bypass--advised to avoid   ??? Nsaids (Non-Steroidal Anti-Inflammatory Drug) Other (comments)     Hx gastric bypass       Review of Systems:   Review of Systems  Constitutional:  No fever, chills, or weight loss  Eyes: No visual symptoms.  ENT: No sore throat, runny nose or ear pain.  Respiratory: No cough, dyspnea or wheezing.  Cardiovascular: No chest pain, pressure, palpitations, tightness or heaviness.  Gastrointestinal: No vomiting, diarrhea or abdominal pain  Genitourinary: No dysuria, frequency, or urgency.  Musculoskeletal: No joint pain   Integumentary: No skin lesions  Neurological: No headaches, sensory or motor symptoms.  Denies complaints in all other systems    Physical Exam:  Visit Vitals  BP 108/73 (BP 1 Location: Left arm, BP Patient Position: Supine)   Pulse 68   Temp 98.1 ??F (36.7 ??C)   Resp 18   SpO2 98%       Physical Exam:   Gen: Conversant, alert, oriented. No acute distress.                        HEENT: NC, AT. Mucous membranes moist. EOMI. No scleral icterus.                        Neck:  Supple                        MSE: Grossly intact                        EXT:  MAE with purpose.  No deformity                        Pulmonary: Clear to auscultation                        Cardiac: Regular rate and rhythm                        Abdomen:  Soft, obese, but non-distended with BS present.  The patient is TTP in the epigastrium without rebound or guarding.  There is some TTP in the RUQ.  A Murphy's sign was not observed.                         NEURO:  Follows commands.  Grossly intact.     Labs:     Labs: Results:   Chemistry Recent Labs     11/29/18  0608 11/28/18  0334 11/27/18  1920   GLU 67* 83 90   NA 136 140 139   K 4.0 4.2 4.1   CL 105 109* 107   CO2 _0 BUN 4* 6* 6*   CREA 0.5* 0.6 0.7   CA 8.4* 8.1* 8.7   AGAP _1 AP 143* 150* 182*   TP 6.3* 5.9* 6.9   ALB 2.4* 2.5* 3.1*    CrCl cannot be calculated (Unknown ideal weight.).   CBC w/Diff Recent Labs     11/29/18  0608 11/28/18  1221 11/28/18  0334  11/27/18  1920   WBC 7.4  --  6.3  --  7.3   RBC 3.41*  --  3.39*  --  3.83   HGB 11.7* 11.6* 11.6*   < > 13.3   HCT 35.1* 35.7* 34.9*   < > 38.6   PLT 205  --  230  --  261   GRANS  --   --  54.7  --  68.8*   LYMPH  --   --  34.6  --  23.3*   EOS  --   --  0.9  --  0.7    < > = values in this interval not displayed.      Coagulation No results for input(s): PTP, INR, APTT, INREXT in the last 72 hours.    Liver Enzymes Recent Labs     11/29/18  0608   TP 6.3*   ALB 2.4*  AP 143*   ALT 42        Images:     No results found.          Konrad Felix, MD  General Surgery  Cell - [phone]  Pager - [phone]    November 29, 2018  10:20 PM

## 2018-11-29 NOTE — Consults (Addendum)
[facility]  General Surgery   [phone]    CONSULT NOTE    Today's date: November 29, 2018  Patient: Ashley [name]                 Sex: female        Date of Birth:  12/27/1985      Age:  33 y.o.          DOA: 11/27/2018  LOS:  LOS: 2 days       Consult requested by: Aamir Khan, MD    Assessment/Plan   33 yo female with pericholecystic fluid and gallbladder sludge in the setting of recurrent pancreatitis, third episode requiring hospitalization since JAN 2020.  The patient continues to have some epigastric pain that radiates to her back, but this has improved since admission and does not appear to be exacerbated by a small amount of po intake.  She is tender on exam, but remains afebrile and HD WNL.  WBC normal, alk phos elevated.  CT/US images reviewed.     Although her recurrent episodes of pancreatitis could be from continued EtOH use, a biliary source is as likely and cholecystectomy before discharge is warranted once she is non-tender as the patient has had difficulty scheduling this procedure as an outpatient.  The importance of EtOH abstinence to avoid future, possibly more severe, episodes of pancreatitis as well as to avoid complications from her gastric bypass surgery was discussed in detail and the patient voices understanding.  Would continue clear liquid diet for now and patient encouraged not to eat the [place] sandwich currently at the bedside.  Will follow along with you and plan for laparoscopic cholecystectomy once her exam improves, likely in the next day or so.  Thank you for this consult and the opportunity to participate in this patient's care.            Chief complaint:   Abdominal pain    HPI:   Ashley [name] is a 33 y.o. female who is being seen in consult with Dr. Khan for evaluation of pancreatitis.  The patient reports three episodes of severe epigastric pain (JAN, JUN, current) that led her to seek  treatment at [facility].  She states that her pain has been associated with nausea and radiates to her back.  The patient was found to have gallbladder sludge as well as a small polyp on a previous admission and was evaluated with recommendations for elective cholecystectomy, however the patient has had difficulty arranging this in the outpatient setting. She does report drinking wine as listed below, but not to excess, and did do so prior to her episodes of pancreatitis.  She continues to have some waxing and waning epigastric pain, but denies fever, chills, chest pain, SOB, palpitations, emesis, melena, hematochezia, or changes to her bowel or bladder habits.     Past Medical History:     Past Medical History:   Diagnosis Date   ??? Ill-defined condition     bronchitis   ??? Pneumonia        Past Surgical History:     Past Surgical History:   Procedure Laterality Date   ??? HX CESAREAN SECTION  2002, 2004, 2008    x 3   ??? HX CESAREAN SECTION     ??? [place]   ??? HX GASTRIC BYPASS     ??? HX GASTRIC BYPASS               Family History   Problem Relation Age of Onset   ??? Diabetes Mother    ??? Hypertension Father    ??? Heart Disease Maternal Grandmother    ??? Heart Attack Maternal Grandmother    ??? High Cholesterol Maternal Grandmother    ??? Arthritis-osteo Maternal Grandmother    ??? Stroke Paternal Aunt    ??? Kidney Disease Maternal Aunt        Social History     Socioeconomic History   ??? Marital status: SINGLE     Spouse name: Not on file   ??? Number of children: Not on file   ??? Years of education: Not on file   ??? Highest education level: Not on file   Tobacco Use   ??? Smoking status: Former Smoker     Types: Cigarettes     Last attempt to quit: 11/12/2009     Years since quitting: 9.0   ??? Smokeless tobacco: Never Used   Substance and Sexual Activity   ??? Alcohol use: Not Currently     Alcohol/week: 0.0 - 4.0 standard drinks     Comment: occassional   ??? Drug use: No   ??? Sexual activity: Yes     Partners: Male      Birth control/protection: None   Social History Narrative    ** Merged History Encounter **            Prior to Admission medications    Medication Sig Start Date End Date Taking? Authorizing Provider   ferrous sulfate 325 mg (65 mg iron) tablet Take 1 Tab by mouth daily (with breakfast). Indications: anemia from inadequate iron 04/12/18  Yes Marzella Schlein, MD   ondansetron (Zofran ODT) 4 mg disintegrating tablet Take 1 Tab by mouth every eight (8) hours as needed for Nausea. 08/31/18   Simoes, Legrand Como, MD   omeprazole (PRILOSEC) 20 mg capsule Take 1 Cap by mouth two (2) times a day. Please open capsule and take granules with apple sauce 08/31/18   Simoes, Ruchita S, MD   folic acid (FOLVITE) 1 mg tablet Take 1 Tab by mouth daily. 09/01/18   Simoes, Ruchita S, MD   thiamine HCL (B-1) 100 mg tablet Take 1 Tab by mouth daily. 09/01/18   Simoes, Ruchita S, MD   cephALEXin (Keflex) 500 mg capsule Take 1 Cap by mouth three (3) times daily. 08/31/18   Simoes, Legrand Como, MD   acetaminophen (TYLENOL) 325 mg tablet Take 2 Tabs by mouth every six (6) hours as needed for Pain. 01/20/18   Neider Loffler, PA-C       Allergies   Allergen Reactions   ??? Aspirin Other (comments)     Stomach ulcer   ??? Ibuprofen Unknown (comments)     Gastric Bypass--advised to avoid   ??? Nsaids (Non-Steroidal Anti-Inflammatory Drug) Other (comments)     Hx gastric bypass       Review of Systems:   Review of Systems  Constitutional:  No fever, chills, or weight loss  Eyes: No visual symptoms.  ENT: No sore throat, runny nose or ear pain.  Respiratory: No cough, dyspnea or wheezing.  Cardiovascular: No chest pain, pressure, palpitations, tightness or heaviness.  Gastrointestinal: No vomiting, diarrhea or abdominal pain  Genitourinary: No dysuria, frequency, or urgency.  Musculoskeletal: No joint pain   Integumentary: No skin lesions  Neurological: No headaches, sensory or motor symptoms.  Denies complaints in all other systems    Physical Exam:  Visit Vitals  BP 108/73 (BP 1 Location: Left arm, BP Patient Position: Supine)   Pulse 68   Temp 98.1 ??F (36.7 ??C)   Resp 18   SpO2 98%       Physical Exam:   Gen: Conversant, alert, oriented. No acute distress.                        HEENT: NC, AT. Mucous membranes moist. EOMI. No scleral icterus.                        Neck:  Supple                        MSE: Grossly intact                        EXT:  MAE with purpose.  No deformity                        Pulmonary: Clear to auscultation                        Cardiac: Regular rate and rhythm                        Abdomen:  Soft, obese, but non-distended with BS present.  The patient is TTP in the epigastrium without rebound or guarding.  There is some TTP in the RUQ.  A Murphy's sign was not observed.                         NEURO:  Follows commands.  Grossly intact.     Labs:     Labs: Results:   Chemistry Recent Labs     11/29/18  0608 11/28/18  0334 11/27/18  1920   GLU 67* 83 90   NA 136 140 139   K 4.0 4.2 4.1   CL 105 109* 107   CO2 _0 BUN 4* 6* 6*   CREA 0.5* 0.6 0.7   CA 8.4* 8.1* 8.7   AGAP _1 AP 143* 150* 182*   TP 6.3* 5.9* 6.9   ALB 2.4* 2.5* 3.1*    CrCl cannot be calculated (Unknown ideal weight.).   CBC w/Diff Recent Labs     11/29/18  0608 11/28/18  1221 11/28/18  0334  11/27/18  1920   WBC 7.4  --  6.3  --  7.3   RBC 3.41*  --  3.39*  --  3.83   HGB 11.7* 11.6* 11.6*   < > 13.3   HCT 35.1* 35.7* 34.9*   < > 38.6   PLT 205  --  230  --  261   GRANS  --   --  54.7  --  68.8*   LYMPH  --   --  34.6  --  23.3*   EOS  --   --  0.9  --  0.7    < > = values in this interval not displayed.      Coagulation No results for input(s): PTP, INR, APTT, INREXT in the last 72 hours.    Liver Enzymes Recent Labs     11/29/18  0608   TP 6.3*   ALB 2.4*  AP 143*   ALT 42        Images:     No results found.          Konrad Felix, MD  General Surgery  Cell - [phone]  Pager - [phone]    November 29, 2018  10:20 PM

## 2018-11-29 NOTE — Other (Signed)
Bedside shift change report given to Rachel, RN (oncoming nurse) by Denise, RN (offgoing nurse). Report included the following information SBAR, Kardex, Intake/Output, MAR and Recent Results.

## 2018-11-29 NOTE — Progress Notes (Signed)
INTERNAL MEDICINE PROGRESS NOTE    Date of note:      November 29, 2018  Patient:               Ashley [name], 33 y.o., female  Admit Date:        11/27/2018  Length of Stay:  2 day(s)    Overnight/24-hour Events:    Reviewed.  Subjective:     Abdominal pain  Assessment:           1. Acute pancreatitis -suspect most likely from alcohol use.  She states that she has reduced amount of alcohol she drinks.  However she has had previous pancreatitis and is more susceptible to further episodes even if she drinks less.  CT Peripancreatic stranding noted on imaging.  Lipase 443.  AST 42 ALT 55 calcium 8.1 TG 117  2. Fatty liver noted on imaging -suspect from alcohol use  3. Macrocytic anemia - suspect secondary to EtOH. B12 wnl. Iron / Ferritin WNL. TIBC slightly low.   4. 5 mm gallbladder polyp and gallbladder sludge noted on ultrasound 9/16.  Pericholecystic fluid.  Murphy sign negative.    RUQ ultrasound 08/28/2018: Gallbladder wall polyp.  3.1 x 2.1 cm fluid collection again noted anterior to head of pancreas unchanged from February 2020  5. Questionable UTI??? positive nitrites small LE with 10-10 WBCs 2+ bacteria  6. History of Roux-en-Y gastric bypass  7. Obesity BMI 36  8. Hx of possible duodenal ulcer perforation with possible abscess  Plan:           ? Continue IV fluids  ? Continue pain control -stop morphine and continue p.o. pain meds  ? Continue antiemetics  ? Continue Protonix IV  ? Trend hemoglobin and hematocrit  ? Hemoccult negative in the ED we will monitor for evidence of GI bleed.  Hemoglobin 11.7 this a.m.  ? Urine cultures growing Klebsiella only resistant to ampicillin.  Continue on rocephine for UTI   ? GI consulted -we will need close outpatient follow-up with GI  ? Continue thiamine, folic acid and PI  ? General surgery consulted given 5 mm gallbladder polyp with lateral sludge.  Consider elective cholecystectomy once acute pancreatitis is  resolved during this hospital stay or during outpatient setting    Diet: Clear liquids  DVT ppx: SCDs    Total clinical care time is 30 minutes, including review of chart including all labs, radiology, past medical history, and discussion with patient and family. Greater than 50% of my time was spent in coordination of care and counseling     Disposition:               home  Objective:   BP 108/71 (BP 1 Location: Left arm, BP Patient Position: Supine)    Pulse 78    Temp 98.9 ??F (37.2 ??C)    Resp 16    SpO2 99%     General: Young female complaining of mild pain but no acute distress  Cardiac: Regular rate and rhythm  Lungs: Clear to auscultation  Abdomen: Tenderness in epigastric and right upper quadrant region without rebound or guarding.  Bowel sounds present  MSK: no edema  Psych: mood appropriate  Neuro: alert oriented x3. No FNDs   Current Medications:     Current Facility-Administered Medications:   ???  lactated Ringers infusion, 150 mL/hr, IntraVENous, CONTINUOUS, Holland Falling, MD, Last Rate: 150 mL/hr at 11/29/18 0815, 150 mL/hr at 11/29/18 0815  ???  HYDROcodone-acetaminophen ([place])  5-325 mg per tablet 1-2 Tab, 1-2 Tab, Oral, Q4H PRN, Holland Falling, MD, 2 Tab at 11/28/18 2351  ???  therapeutic multivitamin-minerals (THERAGRAN-M) tablet 1 Tab, 1 Tab, Oral, DAILY, Dixon, Chantel D, NP  ???  pantoprazole (PROTONIX) 40 mg in 0.9% sodium chloride 10 mL injection, 40 mg, IntraVENous, DAILY, Dixon, Chantel D, NP, 40 mg at 11/29/18 0820  ???  ferrous sulfate tablet 325 mg, 325 mg, Oral, DAILY WITH BREAKFAST, Charlaine Dalton, MD, 325 mg at 62/13/08 6578  ???  folic acid (FOLVITE) tablet 1 mg, 1 mg, Oral, DAILY, Charlaine Dalton, MD, 1 mg at 11/28/18 4696  ???  thiamine HCL (B-1) tablet 100 mg, 100 mg, Oral, DAILY, Charlaine Dalton, MD, 100 mg at 11/28/18 2952  ???  naloxone (NARCAN) injection 0.1 mg, 0.1 mg, IntraVENous, PRN, Charlaine Dalton, MD  ???  ondansetron [facility]) injection 4 mg, 4 mg, IntraVENous, Q4H PRN, Charlaine Dalton, MD, 4 mg at 11/29/18 0603  ???  acetaminophen (TYLENOL) tablet 650 mg, 650 mg, Oral, Q4H PRN **OR** acetaminophen (TYLENOL) solution 650 mg, 650 mg, Oral, Q4H PRN **OR** [DISCONTINUED] acetaminophen (TYLENOL) suppository 650 mg, 650 mg, Rectal, Q4H PRN, Charlaine Dalton, MD  ???  heparin (porcine) injection 5,000 Units, 5,000 Units, SubCUTAneous, Q8H, Charlaine Dalton, MD, 5,000 Units at 11/29/18 0700  ???  cefTRIAXone (ROCEPHIN) 1 g in 0.9% sodium chloride (MBP/ADV) 50 mL MBP, 1 g, IntraVENous, Q24H, Charlaine Dalton, MD, Last Rate: 100 mL/hr at 11/28/18 2214, 1 g at 11/28/18 2214  Labs:     Recent Results (from the past 24 hour(s))   HGB & HCT    Collection Time: 11/28/18 12:21 PM   Result Value Ref Range    HGB 11.6 (L) 13.0 - 17.2 gm/dl    HCT 35.7 (L) 37.0 - 50.0 %   CBC W/O DIFF    Collection Time: 11/29/18  6:08 AM   Result Value Ref Range    WBC 7.4 4.0 - 11.0 1000/mm3    RBC 3.41 (L) 3.60 - 5.20 M/uL    HGB 11.7 (L) 13.0 - 17.2 gm/dl    HCT 35.1 (L) 37.0 - 50.0 %    MCV 102.9 (H) 80.0 - 98.0 fL    MCH 34.3 25.4 - 34.6 pg    MCHC 33.3 30.0 - 36.0 gm/dl    PLATELET 205 140 - 450 1000/mm3    MPV 11.7 (H) 6.0 - 10.0 fL    RDW-SD 49.8 (H) 36.4 - 84.1     METABOLIC PANEL, COMPREHENSIVE    Collection Time: 11/29/18  6:08 AM   Result Value Ref Range    Sodium 136 136 - 145 mEq/L    Potassium 4.0 3.5 - 5.1 mEq/L    Chloride 105 98 - 107 mEq/L    CO2 23 21 - 32 mEq/L    Glucose 67 (L) 74 - 106 mg/dl    BUN 4 (L) 7 - 25 mg/dl    Creatinine 0.5 (L) 0.6 - 1.3 mg/dl    GFR est AA >60.0      GFR est non-AA >60      Calcium 8.4 (L) 8.5 - 10.1 mg/dl    AST (SGOT) 33 15 - 37 U/L    ALT (SGPT) 42 12 - 78 U/L    Alk. phosphatase 143 (H) 45 - 117 U/L    Bilirubin, total 0.5 0.2 - 1.0 mg/dl    Protein, total 6.3 (L) 6.4 - 8.2 gm/dl    Albumin 2.4 (L) 3.4 -  5.0 gm/dl    Anion gap 7 5 - 15 mmol/L   GLUCOSE, POC    Collection Time: 11/29/18  7:50 AM   Result Value Ref Range    Glucose (POC) 77 65 - 105 mg/dL     Radiology    [employer] [facility]    Result Date: 11/28/2018  INDICATION: prior imaging showing fluid collection    PROCEDURE: [place] [facility] COMPARISON: 11/27/2018 FINDINGS: The liver is echogenic and measures 18.9 cm. 5 mm gallbladder polyp and also gallbladder sludge. Pericholecystic fluid. Murphy's sign is negative. The common bile duct measures 5 mm. Gallbladder wall thickness is 1 mm. Ultrasound appearance of the pancreas is normal but by CT there is known pancreatitis. The aorta and inferior vena cava show normal diameters. The right kidney measures 9.5 x 5.1 x 4.9 cm.     IMPRESSION: 1. 5 mm gallbladder polyp and gallbladder sludge. Pericholecystic fluid. Murphy's sign is negative. 2. Known pancreatitis by CT. No intrinsic abnormality of the pancreas by ultrasound. 3. Fatty infiltration of the liver.        Portions of this electronic record were dictated using Systems analyst. Unintended errors in translation may occur.      Holland Falling, MD  [facility]  November 29, 2018  9:14 AM

## 2018-11-30 LAB — COMPREHENSIVE METABOLIC PANEL
ALT: 49 U/L (ref 12–78)
AST: 60 U/L — ABNORMAL HIGH (ref 15–37)
Albumin: 2.4 gm/dl — ABNORMAL LOW (ref 3.4–5.0)
Alkaline Phosphatase: 164 U/L — ABNORMAL HIGH (ref 45–117)
Anion Gap: 6 mmol/L (ref 5–15)
BUN: 3 mg/dl — ABNORMAL LOW (ref 7–25)
CO2: 27 mEq/L (ref 21–32)
Calcium: 8.8 mg/dl (ref 8.5–10.1)
Chloride: 106 mEq/L (ref 98–107)
Creatinine: 0.6 mg/dl (ref 0.6–1.3)
EGFR IF NonAfrican American: >60
GFR African American: >60
Glucose: 68 mg/dl — ABNORMAL LOW (ref 74–106)
Potassium: 3.7 mEq/L (ref 3.5–5.1)
Sodium: 139 mEq/L (ref 136–145)
Total Bilirubin: 0.4 mg/dl (ref 0.2–1.0)
Total Protein: 6 gm/dl — ABNORMAL LOW (ref 6.4–8.2)

## 2018-11-30 LAB — CBC
Hematocrit: 34.7 % — ABNORMAL LOW (ref 37.0–50.0)
Hemoglobin: 11.6 gm/dl — ABNORMAL LOW (ref 13.0–17.2)
MCH: 34.2 pg (ref 25.4–34.6)
MCHC: 33.4 gm/dl (ref 30.0–36.0)
MCV: 102.4 fL — ABNORMAL HIGH (ref 80.0–98.0)
MPV: 12.1 fL — ABNORMAL HIGH (ref 6.0–10.0)
Platelets: 208 10*3/uL (ref 140–450)
RBC: 3.39 M/uL — ABNORMAL LOW (ref 3.60–5.20)
RDW-SD: 50.5 — ABNORMAL HIGH (ref 36.4–46.3)
WBC: 4.1 10*3/uL (ref 4.0–11.0)

## 2018-11-30 LAB — METABOLIC PANEL, COMPREHENSIVE
ALT (SGPT): 49 U/L (ref 12–78)
AST (SGOT): 60 U/L — ABNORMAL HIGH (ref 15–37)
Albumin: 2.4 gm/dl — ABNORMAL LOW (ref 3.4–5.0)
Alk. phosphatase: 164 U/L — ABNORMAL HIGH (ref 45–117)
Anion gap: 6 mmol/L (ref 5–15)
BUN: 3 mg/dl — ABNORMAL LOW (ref 7–25)
Bilirubin, total: 0.4 mg/dl (ref 0.2–1.0)
CO2: 27 mEq/L (ref 21–32)
Calcium: 8.8 mg/dl (ref 8.5–10.1)
Chloride: 106 mEq/L (ref 98–107)
Creatinine: 0.6 mg/dl (ref 0.6–1.3)
GFR est AA: >60
GFR est non-AA: >60
Glucose: 68 mg/dl — ABNORMAL LOW (ref 74–106)
Potassium: 3.7 mEq/L (ref 3.5–5.1)
Protein, total: 6 gm/dl — ABNORMAL LOW (ref 6.4–8.2)
Sodium: 139 mEq/L (ref 136–145)

## 2018-11-30 LAB — CBC W/O DIFF
HCT: 34.7 % — ABNORMAL LOW (ref 37.0–50.0)
HGB: 11.6 gm/dl — ABNORMAL LOW (ref 13.0–17.2)
MCH: 34.2 pg (ref 25.4–34.6)
MCHC: 33.4 gm/dl (ref 30.0–36.0)
MCV: 102.4 fL — ABNORMAL HIGH (ref 80.0–98.0)
MPV: 12.1 fL — ABNORMAL HIGH (ref 6.0–10.0)
PLATELET: 208 10*3/uL (ref 140–450)
RBC: 3.39 M/uL — ABNORMAL LOW (ref 3.60–5.20)
RDW-SD: 50.5 — ABNORMAL HIGH (ref 36.4–46.3)
WBC: 4.1 10*3/uL (ref 4.0–11.0)

## 2018-11-30 MED ORDER — DEXMEDETOMIDINE 100 MCG/ML IV SOLN
100 mcg/mL | INTRAVENOUS | Status: DC | PRN
Start: 2018-11-30 — End: 2018-11-30
  Administered 2018-11-30 (×2): via INTRAVENOUS

## 2018-11-30 MED ORDER — MIDAZOLAM 1 MG/ML IJ SOLN
1 mg/mL | INTRAMUSCULAR | Status: AC
Start: 2018-11-30 — End: ?

## 2018-11-30 MED ORDER — NALOXONE 0.4 MG/ML INJECTION
0.4 mg/mL | INTRAMUSCULAR | Status: DC | PRN
Start: 2018-11-30 — End: 2018-11-30

## 2018-11-30 MED ORDER — MEPERIDINE (PF) 25 MG/ML INJ SOLUTION
25 mg/ml | INTRAMUSCULAR | Status: DC | PRN
Start: 2018-11-30 — End: 2018-11-30

## 2018-11-30 MED ORDER — FENTANYL CITRATE (PF) 50 MCG/ML IJ SOLN
50 mcg/mL | INTRAMUSCULAR | Status: DC | PRN
Start: 2018-11-30 — End: 2018-11-30
  Administered 2018-11-30: 15:00:00 via INTRAVENOUS

## 2018-11-30 MED ORDER — INDOCYANINE GREEN 25 MG INJECTION
25 mg | INTRAMUSCULAR | Status: AC
Start: 2018-11-30 — End: 2018-11-30
  Administered 2018-11-30: 14:00:00 via INTRAVENOUS

## 2018-11-30 MED ORDER — PROMETHAZINE IN NS 12.5 MG/50 ML IV PIGGY BAG
12.5 mg/50 ml | INTRAVENOUS | Status: AC
Start: 2018-11-30 — End: 2018-11-30
  Administered 2018-11-30: 15:00:00

## 2018-11-30 MED ORDER — ONDANSETRON (PF) 4 MG/2 ML INJECTION
4 mg/2 mL | INTRAMUSCULAR | Status: DC | PRN
Start: 2018-11-30 — End: 2018-11-30
  Administered 2018-11-30: 14:00:00 via INTRAVENOUS

## 2018-11-30 MED ORDER — OCTYL 2-CYANOACRYLATE TOPICAL LIQUID
CUTANEOUS | Status: AC
Start: 2018-11-30 — End: ?

## 2018-11-30 MED ORDER — LIDOCAINE-EPINEPHRINE 1 %-1:100,000 IJ SOLN
1 %-:00,000 | INTRAMUSCULAR | Status: DC | PRN
Start: 2018-11-30 — End: 2018-11-30
  Administered 2018-11-30: 14:00:00 via SUBCUTANEOUS

## 2018-11-30 MED ORDER — SUGAMMADEX 100 MG/ML INTRAVENOUS SOLUTION
100 mg/mL | INTRAVENOUS | Status: DC | PRN
Start: 2018-11-30 — End: 2018-11-30
  Administered 2018-11-30: 14:00:00 via INTRAVENOUS

## 2018-11-30 MED ORDER — LIDOCAINE-EPINEPHRINE 1 %-1:100,000 IJ SOLN
1 %-:00,000 | INTRAMUSCULAR | Status: AC
Start: 2018-11-30 — End: ?

## 2018-11-30 MED ORDER — LIDOCAINE (PF) 20 MG/ML (2 %) IJ SOLN
20 mg/mL (2 %) | INTRAMUSCULAR | Status: DC | PRN
Start: 2018-11-30 — End: 2018-11-30
  Administered 2018-11-30: 14:00:00 via INTRAVENOUS

## 2018-11-30 MED ORDER — ROCURONIUM 10 MG/ML IV
10 mg/mL | INTRAVENOUS | Status: DC | PRN
Start: 2018-11-30 — End: 2018-11-30
  Administered 2018-11-30: 14:00:00 via INTRAVENOUS

## 2018-11-30 MED ORDER — FLUMAZENIL 0.1 MG/ML IV SOLN
0.1 mg/mL | INTRAVENOUS | Status: DC | PRN
Start: 2018-11-30 — End: 2018-11-30

## 2018-11-30 MED ORDER — LABETALOL 5 MG/ML IV SOLN
5 mg/mL | INTRAVENOUS | Status: DC | PRN
Start: 2018-11-30 — End: 2018-11-30

## 2018-11-30 MED ORDER — DIPHENHYDRAMINE HCL 50 MG/ML IJ SOLN
50 mg/mL | Freq: Once | INTRAMUSCULAR | Status: DC | PRN
Start: 2018-11-30 — End: 2018-11-30

## 2018-11-30 MED ORDER — FENTANYL CITRATE (PF) 50 MCG/ML IJ SOLN
50 mcg/mL | INTRAMUSCULAR | Status: DC | PRN
Start: 2018-11-30 — End: 2018-11-30
  Administered 2018-11-30 (×3): via INTRAVENOUS

## 2018-11-30 MED ORDER — PROPOFOL 10 MG/ML IV EMUL
10 mg/mL | INTRAVENOUS | Status: DC | PRN
Start: 2018-11-30 — End: 2018-11-30
  Administered 2018-11-30: 14:00:00 via INTRAVENOUS

## 2018-11-30 MED ORDER — ONDANSETRON (PF) 4 MG/2 ML INJECTION
4 mg/2 mL | Freq: Once | INTRAMUSCULAR | Status: DC | PRN
Start: 2018-11-30 — End: 2018-11-30

## 2018-11-30 MED ORDER — HYDROMORPHONE 1 MG/ML INJECTION SOLUTION
1 mg/mL | INTRAMUSCULAR | Status: DC | PRN
Start: 2018-11-30 — End: 2018-11-30

## 2018-11-30 MED ORDER — MIDAZOLAM 1 MG/ML IJ SOLN
1 mg/mL | INTRAMUSCULAR | Status: DC | PRN
Start: 2018-11-30 — End: 2018-11-30
  Administered 2018-11-30: 14:00:00 via INTRAVENOUS

## 2018-11-30 MED ORDER — FENTANYL CITRATE (PF) 50 MCG/ML IJ SOLN
50 mcg/mL | INTRAMUSCULAR | Status: AC
Start: 2018-11-30 — End: ?

## 2018-11-30 MED ORDER — INDOCYANINE GREEN 25 MG INJECTION
25 mg | INTRAMUSCULAR | Status: AC
Start: 2018-11-30 — End: ?

## 2018-11-30 MED FILL — ONDANSETRON (PF) 4 MG/2 ML INJECTION: 4 mg/2 mL | INTRAMUSCULAR | Qty: 2

## 2018-11-30 MED FILL — INDOCYANINE GREEN 25 MG INJECTION: 25 mg | INTRAMUSCULAR | Qty: 25

## 2018-11-30 MED FILL — FENTANYL CITRATE (PF) 50 MCG/ML IJ SOLN: 50 mcg/mL | INTRAMUSCULAR | Qty: 2

## 2018-11-30 MED FILL — PANTOPRAZOLE 40 MG IV SOLR: 40 mg | INTRAVENOUS | Qty: 40

## 2018-11-30 MED FILL — HEPARIN (PORCINE) 5,000 UNIT/ML IJ SOLN: 5000 unit/mL | INTRAMUSCULAR | Qty: 1

## 2018-11-30 MED FILL — PROMETHAZINE IN NS 12.5 MG/50 ML IV PIGGY BAG: 12.5 mg/50 ml | INTRAVENOUS | Qty: 50

## 2018-11-30 MED FILL — MIDAZOLAM 1 MG/ML IJ SOLN: 1 mg/mL | INTRAMUSCULAR | Qty: 2

## 2018-11-30 MED FILL — CEFTRIAXONE 1 GRAM SOLUTION FOR INJECTION: 1 gram | INTRAMUSCULAR | Qty: 1

## 2018-11-30 MED FILL — OCTYL 2-CYANOACRYLATE TOPICAL LIQUID: CUTANEOUS | Qty: 1

## 2018-11-30 MED FILL — HYDROCODONE-ACETAMINOPHEN 5 MG-325 MG TAB: 5-325 mg | ORAL | Qty: 2

## 2018-11-30 MED FILL — XYLOCAINE WITH EPINEPHRINE 1 %-1:100,000 INJECTION SOLUTION: 1 %-:00,000 | INTRAMUSCULAR | Qty: 20

## 2018-11-30 NOTE — Anesthesia Pre-Procedure Evaluation (Signed)
Relevant Problems   No relevant active problems       Anesthetic History   No history of anesthetic complications            Review of Systems / Medical History  Patient summary reviewed, nursing notes reviewed and pertinent labs reviewed    Pulmonary  Within defined limits                 Neuro/Psych   Within defined limits           Cardiovascular  Within defined limits                Exercise tolerance: >4 METS     GI/Hepatic/Renal               Comments: Abdominal pain Endo/Other        Anemia     Other Findings   Comments: S/p gastric bypass  Chronic anemia           Physical Exam    Airway  Mallampati: II  TM Distance: 4 - 6 cm  Neck ROM: normal range of motion   Mouth opening: Normal     Cardiovascular  Regular rate and rhythm,  S1 and S2 normal,  no murmur, click, rub, or gallop             Dental    Dentition: Poor dentition     Pulmonary  Breath sounds clear to auscultation               Abdominal  GI exam deferred       Other Findings            Anesthetic Plan    ASA: 2  Anesthesia type: general          Induction: Intravenous  Anesthetic plan and risks discussed with: Patient      No NSAIDS

## 2018-11-30 NOTE — Progress Notes (Signed)
Progress  Notes by Holland Falling, MD at 11/30/18 1359                Author: Holland Falling, MD  Service: Hospitalist  Author Type: Physician       Filed: 11/30/18 1402  Date of Service: 11/30/18 1359  Status: Signed          Editor: Holland Falling, MD (Physician)                       INTERNAL MEDICINE PROGRESS NOTE      Date of note:      November 30, 2018   Patient:               Ashley [name] , 33 y.o., female   Admit Date:        11/27/2018   Length of Stay:  3 day(s)        Overnight/24-hour Events:      Reviewed.     Subjective:       S/p cholecystectomy. Soreness around surgical incisions but in good spirits overall     Assessment:             1.  Acute pancreatitis -suspect most likely from alcohol use/biliary source. She states that she has reduced amount of alcohol she drinks.  However  she has had previous pancreatitis and is more susceptible to further episodes even if she drinks less.  CT Peripancreatic stranding noted on imaging.  Lipase 443.  AST 42 ALT 55 calcium 8.1 TG 117   2.  Fatty liver noted on imaging -suspect from alcohol use   3.  Macrocytic anemia - suspect secondary to EtOH. B12 wnl. Iron / Ferritin WNL. TIBC slightly low.    4.  5 mm gallbladder polyp and gallbladder sludge noted on ultrasound 9/16.  Pericholecystic fluid.  Murphy sign negative.  S/p Cholecystectomy on 9/18   RUQ ultrasound 08/28/2018: Gallbladder wall polyp.  3.1 x 2.1 cm fluid collection again noted anterior to head of pancreas unchanged from February 2020   5.  Questionable UTI- positive nitrites small LE with 10-10 WBCs 2+ bacteria   6.  History of Roux-en-Y gastric bypass   7.  Obesity BMI 36   8.  Hx of possible duodenal ulcer perforation with possible abscess     Plan:             Stop IVF   ??  Continue pain control -stop morphine and continue p.o. pain meds   ??  Continue antiemetics   ??  Continue Protonix IV   ??  Trend hemoglobin and hematocrit   ??  Hemoccult negative in the ED we will monitor for evidence of  GI bleed.  Hemoglobin 11.7 this a.m.   ??  Urine cultures growing Klebsiella only resistant to ampicillin.  Continue on rocephine for UTI    ??  GI consulted -we will need close outpatient follow-up with GI   ??  Continue thiamine, folic acid and PI   ??  General surgery following s/p cholecystectomy      Diet: regular   DVT ppx: heparin        Total clinical care time is 30 minutes, including review of chart including all labs, radiology, past medical history, and discussion with patient and  family. Greater than 50% of my time was spent in coordination of care and counseling          Disposition:  home     Objective:     BP 116/73 (BP 1 Location: Left arm, BP Patient Position: Sitting)    Pulse 66    Temp 98.4 ??F (36.9 ??C)    Resp 16    SpO2 100%       General: alert oriented in no apparent distress   Cardiac: Regular rate and rhythm   Lungs: Clear to auscultation   Abdomen: lap incision sites look clean. Some tenderness around the sites. No rebound/guarding.  Bowel sounds present   MSK: no edema   Psych: mood appropriate   Neuro: alert oriented x3. No FNDs      Current Medications:        Current Facility-Administered Medications:    ?  lactated Ringers infusion, 125 mL/hr, IntraVENous, CONTINUOUS, Erskine Emery, MD, Last Rate: 125 mL/hr at 11/30/18 0854   ?  HYDROcodone-acetaminophen ([place]) 5-325 mg per tablet 1-2 Tab, 1-2 Tab, Oral, Q4H PRN, Erskine Emery, MD, 2 Tab at 11/30/18 1211   ?  therapeutic multivitamin-minerals (THERAGRAN-M) tablet 1 Tab, 1 Tab, Oral, DAILY, Erskine Emery, MD, Stopped at 11/30/18 0900   ?  pantoprazole (PROTONIX) 40 mg in 0.9% sodium chloride 10 mL injection, 40 mg, IntraVENous, DAILY, Erskine Emery, MD, 40 mg at 11/30/18 0802   ?  ferrous sulfate tablet 325 mg, 325 mg, Oral, DAILY WITH Rogelia Rohrer, MD, Stopped at 11/30/18 0800   ?  folic acid (FOLVITE) tablet 1 mg, 1 mg, Oral, DAILY, Erskine Emery, MD, Stopped at 11/30/18 0900   ?  thiamine HCL (B-1) tablet 100 mg,  100 mg, Oral, DAILY, Erskine Emery, MD, Stopped at 11/30/18 0900   ?  naloxone [facility]) injection 0.1 mg, 0.1 mg, IntraVENous, PRN, Erskine Emery, MD   ?  ondansetron [facility]) injection 4 mg, 4 mg, IntraVENous, Q4H PRN, Erskine Emery, MD, 4 mg at 11/30/18 1211   ?  acetaminophen (TYLENOL) tablet 650 mg, 650 mg, Oral, Q4H PRN **OR** acetaminophen (TYLENOL) solution 650 mg, 650 mg, Oral, Q4H PRN **OR** [DISCONTINUED] acetaminophen (TYLENOL) suppository 650 mg, 650 mg, Rectal, Q4H PRN, Charlaine Dalton, MD   ?  heparin (porcine) injection 5,000 Units, 5,000 Units, SubCUTAneous, Q8H, Erskine Emery, MD, Stopped at 11/30/18 0700     Labs:          Recent Results (from the past 24 hour(s))     CBC W/O DIFF          Collection Time: 11/30/18  5:02 AM         Result  Value  Ref Range            WBC  4.1  4.0 - 11.0 1000/mm3       RBC  3.39 (L)  3.60 - 5.20 M/uL       HGB  11.6 (L)  13.0 - 17.2 gm/dl       HCT  34.7 (L)  37.0 - 50.0 %       MCV  102.4 (H)  80.0 - 98.0 fL       MCH  34.2  25.4 - 34.6 pg            MCHC  33.4  30.0 - 36.0 gm/dl            PLATELET  208  140 - 450 1000/mm3       MPV  12.1 (H)  6.0 - 10.0 fL       RDW-SD  50.5 (H)  36.4 - 46.3  METABOLIC PANEL, COMPREHENSIVE          Collection Time: 11/30/18  5:02 AM         Result  Value  Ref Range            Sodium  139  136 - 145 mEq/L       Potassium  3.7  3.5 - 5.1 mEq/L       Chloride  106  98 - 107 mEq/L       CO2  27  21 - 32 mEq/L       Glucose  68 (L)  74 - 106 mg/dl       BUN  3 (L)  7 - 25 mg/dl       Creatinine  0.6  0.6 - 1.3 mg/dl       GFR est AA  >60.0          GFR est non-AA  >60          Calcium  8.8  8.5 - 10.1 mg/dl       AST (SGOT)  60 (H)  15 - 37 U/L       ALT (SGPT)  49  12 - 78 U/L       Alk. phosphatase  164 (H)  45 - 117 U/L       Bilirubin, total  0.4  0.2 - 1.0 mg/dl       Protein, total  6.0 (L)  6.4 - 8.2 gm/dl       Albumin  2.4 (L)  3.4 - 5.0 gm/dl            Anion gap  6  5 - 15 mmol/L          Radiology     No results found.        Portions of this electronic record were dictated using Systems analyst. Unintended errors in translation may occur.         Holland Falling, MD   [facility]   November 30, 2018   9:14 AM

## 2018-11-30 NOTE — Op Note (Signed)
[place]  Operation Report  NAME:  Ashley [name]  SEX:   female  DATE: 11/30/2018   DOB: [birth year]  MR#    [id number]      PREOPERATIVE DIAGNOSES:  BILIARY PANCREATITIS     POSTOPERATIVE DIAGNOSES:  BILIARY PANCREATITIS    PROCEDURE:  Procedure(s):  CHOLECYSTECTOMY XI ROBOTIC ASSISTED           ANESTHESIA:  General    COMPLICATIONS:  None.    ESTIMATED BLOOD LOSS:  Minimal.    SPECIMEN:  ID Type Source Tests Collected by Time Destination   1 : gallbladder and contents Tissue Gallbladder AP HISTOLOGY Erskine Emery, MD 11/30/2018 1009            IMPLANT:  * No implants in log *     SURGEON:  Erskine Emery, MD    DESCRIPTION OF PROCEDURE:  The patient was brought to the operating room and placed supine on the  operating table.  After initiation of anesthesia by anesthesia staff, the  patient was prepped in the usual sterile fashion.  Preoperative antibiotics  and SCDs were used.  Infraumbilical curvilinear incision was made.  The skin  and soft tissue were divided with electrocautery at the level of the anterior  abdominal fascia.  Anterior fascia was grasped with Kocher clamp and elevated  and 0 Vicryl pursestring suture was then placed.  The anterior fascia was  incised with a 15 blade and was then bluntly entered .  An  8.5 robotic trocar   was placed and secured and the abdomen was insufflated at 15 mmHg.  Under  laparoscopic exploration, no injuries to enteric or vascular structures were  noted.  At this time, a left lateral and right lateral robotic  trocars were also placed.  The patient was then placed in reverse  Trendelenburg position.  The robot was brought into position and docked.      I unscrubbed and attended to  the console.  Attention was turned to the  gallbladder.  The gallbladder was then grasped and elevated in a craniolateral   fashion.  Using blunt dissection, The cystic artery and cystic duct were  identified.  Critical view of safety was obtained and we saw the cystic duct  and  cystic artery traverse across the bed of the liver.  Fluorescence imaging   was used to identify the cystic duct at the base of the gallbladder heading   towards the common bile duct.  The cystic duct was then doubly clipped  and ligated.  The cystic artery was singly clipped and ligated.  The  gallbladder was removed from the gallbladder with electrocautery and placed in   an EndoCatch bag.    The robot was undocked and the specimen was removed   from the umbilical trocar site.  The umbilical fascia was closed using the   preplaced pursestring suture.  All trocars were removed under direct visualization.    Pneumoperitoneum was completely released.  The wound was infiltrated with   1% Lidocaine with epinephrine and the skin was closed using 4-0 Monocryl in a subcuticular fashion, dressed with  Dermabond.  All counts were correct.  The patient   tolerated the procedure well.

## 2018-11-30 NOTE — Anesthesia Post-Procedure Evaluation (Signed)
Procedure(s):  CHOLECYSTECTOMY XI ROBOTIC ASSISTED.    general    Anesthesia Post Evaluation      Multimodal analgesia: multimodal analgesia used between 6 hours prior to anesthesia start to PACU discharge  Patient location during evaluation: PACU  Level of consciousness: sleepy but conscious  Pain management: adequate  Airway patency: patent  Anesthetic complications: no  Cardiovascular status: acceptable  Respiratory status: acceptable  Hydration status: acceptable        INITIAL Post-op Vital signs:   Vitals Value Taken Time   BP 106/65 11/30/2018 11:00 AM   Temp 36.7 ??C (98 ??F) 11/30/2018 10:57 AM   Pulse 88 11/30/2018 11:14 AM   Resp 20 11/30/2018 11:14 AM   SpO2 92 % 11/30/2018 11:14 AM   Vitals shown include unvalidated device data.

## 2018-11-30 NOTE — Progress Notes (Signed)
Problem: Pain  Goal: *Control of Pain  Outcome: Progressing Towards Goal     Problem: Patient Education: Go to Patient Education Activity  Goal: Patient/Family Education  Outcome: Progressing Towards Goal     Problem: Falls - Risk of  Goal: *Absence of Falls  Description: Document Bridgette Habermann Fall Risk and appropriate interventions in the flowsheet.  Outcome: Progressing Towards Goal  Note: Fall Risk Interventions:  Mobility Interventions: Patient to call before getting OOB         Medication Interventions: Bed/chair exit alarm, Patient to call before getting OOB                   Problem: Patient Education: Go to Patient Education Activity  Goal: Patient/Family Education  Outcome: Progressing Towards Goal

## 2018-11-30 NOTE — Progress Notes (Signed)
Progress Note    Patient: Ashley [name] MRN: [id number]  SSN: [id number]    Date of Birth: 1986/01/26  Age: 33 y.o.  Sex: female      Admit Date: 11/27/2018    * No surgery found *    Procedure:      Subjective:     Patient has no new complaints. Feels better.    Objective:     Visit Vitals  BP 115/71 (BP 1 Location: Left arm, BP Patient Position: Supine)   Pulse 85   Temp 98.6 ??F (37 ??C)   Resp 17   SpO2 100%       Temp (24hrs), Avg:98.5 ??F (36.9 ??C), Min:98.1 ??F (36.7 ??C), Max:98.9 ??F (37.2 ??C)      Physical Exam:    ABDOMEN: soft, non-tender. Bowel sounds normal. No masses,  no organomegaly    Data Review: images and reports reviewed    Lab Review:   CMP:   Lab Results   Component Value Date/Time    NA 139 11/30/2018 05:02 AM    K 3.7 11/30/2018 05:02 AM    CL 106 11/30/2018 05:02 AM    CO2 27 11/30/2018 05:02 AM    AGAP 6 11/30/2018 05:02 AM    GLU 68 (L) 11/30/2018 05:02 AM    BUN 3 (L) 11/30/2018 05:02 AM    CREA 0.6 11/30/2018 05:02 AM    GFRAA >60.0 11/30/2018 05:02 AM    GFRNA >60 11/30/2018 05:02 AM    CA 8.8 11/30/2018 05:02 AM    ALB 2.4 (L) 11/30/2018 05:02 AM    TP 6.0 (L) 11/30/2018 05:02 AM    ALT 49 11/30/2018 05:02 AM     CBC:   Lab Results   Component Value Date/Time    WBC 4.1 11/30/2018 05:02 AM    HGB 11.6 (L) 11/30/2018 05:02 AM    HCT 34.7 (L) 11/30/2018 05:02 AM    PLT 208 11/30/2018 05:02 AM     Pancreatic Markers: No results found for: AMYLPOCT, AML, LIPPOCT, LPSE    Assessment:     Hospital Problems  Date Reviewed: 2018-04-16          Codes Class Noted POA    Acute pancreatitis ICD-10-CM: K85.90  ICD-9-CM: 235.3  08/28/2018 Unknown              Plan/Recommendations/Medical Decision Making:     Continue present treatment.  Plan robotic cholecystectomy.  Risks and benefits d/w pt and family.  Plan to OR today.

## 2018-11-30 NOTE — Op Note (Signed)
[place]  Operation Report  NAME:  Ashley [name]  SEX:   female  DATE: 11/30/2018   DOB: 30-Sep-1985  MR#    [id number]      PREOPERATIVE DIAGNOSES:  BILIARY PANCREATITIS     POSTOPERATIVE DIAGNOSES:  BILIARY PANCREATITIS    PROCEDURE:  Procedure(s):  CHOLECYSTECTOMY XI ROBOTIC ASSISTED           ANESTHESIA:  General    COMPLICATIONS:  None.    ESTIMATED BLOOD LOSS:  Minimal.    SPECIMEN:  ID Type Source Tests Collected by Time Destination   1 : gallbladder and contents Tissue Gallbladder AP HISTOLOGY Erskine Emery, MD 11/30/2018 1009            IMPLANT:  * No implants in log *     SURGEON:  Erskine Emery, MD    DESCRIPTION OF PROCEDURE:  The patient was brought to the operating room and placed supine on the  operating table.  After initiation of anesthesia by anesthesia staff, the  patient was prepped in the usual sterile fashion.  Preoperative antibiotics  and SCDs were used.  Infraumbilical curvilinear incision was made.  The skin  and soft tissue were divided with electrocautery at the level of the anterior  abdominal fascia.  Anterior fascia was grasped with Kocher clamp and elevated  and 0 Vicryl pursestring suture was then placed.  The anterior fascia was  incised with a 15 blade and was then bluntly entered .  An  8.5 robotic trocar   was placed and secured and the abdomen was insufflated at 15 mmHg.  Under  laparoscopic exploration, no injuries to enteric or vascular structures were  noted.  At this time, a left lateral and right lateral robotic  trocars were also placed.  The patient was then placed in reverse  Trendelenburg position.  The robot was brought into position and docked.      I unscrubbed and attended to  the console.  Attention was turned to the  gallbladder.  The gallbladder was then grasped and elevated in a craniolateral   fashion.  Using blunt dissection, The cystic artery and cystic duct were  identified.  Critical view of safety was obtained and we saw the cystic duct   and cystic artery traverse across the bed of the liver.  Fluorescence imaging   was used to identify the cystic duct at the base of the gallbladder heading   towards the common bile duct.  The cystic duct was then doubly clipped  and ligated.  The cystic artery was singly clipped and ligated.  The  gallbladder was removed from the gallbladder with electrocautery and placed in   an EndoCatch bag.    The robot was undocked and the specimen was removed   from the umbilical trocar site.  The umbilical fascia was closed using the   preplaced pursestring suture.  All trocars were removed under direct visualization.    Pneumoperitoneum was completely released.  The wound was infiltrated with   1% Lidocaine with epinephrine and the skin was closed using 4-0 Monocryl in a subcuticular fashion, dressed with  Dermabond.  All counts were correct.  The patient   tolerated the procedure well.

## 2018-11-30 NOTE — Other (Signed)
TRANSFER - OUT REPORT:    Verbal report given to Jacki Cones, RN on [employer]  being transferred to 4W(unit) for routine post - op       Report consisted of patient???s Situation, Background, Assessment and   Recommendations(SBAR).     Information from the following report(s) SBAR, Kardex, OR Summary, Procedure Summary, Intake/Output and MAR was reviewed with the receiving nurse.    Lines:   Peripheral IV 11/28/18 Right;Upper Cephalic (Active)   Site Assessment Clean, dry, & intact 11/30/18 1050   Phlebitis Assessment 0 11/30/18 1050   Infiltration Assessment 0 11/30/18 1050   Dressing Status Clean, dry, & intact 11/30/18 1050   Dressing Type Tape;Transparent 11/30/18 0800   Hub Color/Line Status Infusing;Capped 11/30/18 0800   Action Taken Open ports on tubing capped 11/30/18 0800   Alcohol Cap Used Yes 11/30/18 0800        Opportunity for questions and clarification was provided.      Patient transported with:   [employer]

## 2018-11-30 NOTE — Progress Notes (Signed)
Problem: Pain  Goal: *Control of Pain  Outcome: Progressing Towards Goal     Problem: Patient Education: Go to Patient Education Activity  Goal: Patient/Family Education  Outcome: Progressing Towards Goal     Problem: Falls - Risk of  Goal: *Absence of Falls  Description: Document Schmid Fall Risk and appropriate interventions in the flowsheet.  Outcome: Progressing Towards Goal  Note: Fall Risk Interventions:  Mobility Interventions: Patient to call before getting OOB         Medication Interventions: Bed/chair exit alarm, Patient to call before getting OOB                   Problem: Patient Education: Go to Patient Education Activity  Goal: Patient/Family Education  Outcome: Progressing Towards Goal

## 2018-11-30 NOTE — Interval H&P Note (Signed)
Update History & Physical    The Patient's History and Physical was reviewed with the patient and I examined the patient.  There was no change.  The surgical site was confirmed by the patient and me.    Plan:  The risk, benefits, expected outcome, and alternative to the recommended procedure have been discussed with the patient.  Patient understands and wants to proceed with the procedure.    Electronically signed by Holly Bodily, MD on 11/30/2018 at 9:17 AM

## 2018-11-30 NOTE — Anesthesia Pre-Procedure Evaluation (Signed)
Relevant Problems   No relevant active problems       Anesthetic History   No history of anesthetic complications            Review of Systems / Medical History  Patient summary reviewed, nursing notes reviewed and pertinent labs reviewed    Pulmonary  Within defined limits                 Neuro/Psych   Within defined limits           Cardiovascular  Within defined limits                Exercise tolerance: >4 METS     GI/Hepatic/Renal               Comments: Abdominal pain Endo/Other        Anemia     Other Findings   Comments: S/p gastric bypass  Chronic anemia           Physical Exam    Airway  Mallampati: II  TM Distance: 4 - 6 cm  Neck ROM: normal range of motion   Mouth opening: Normal     Cardiovascular  Regular rate and rhythm,  S1 and S2 normal,  no murmur, click, rub, or gallop             Dental    Dentition: Poor dentition     Pulmonary  Breath sounds clear to auscultation               Abdominal  GI exam deferred       Other Findings            Anesthetic Plan    ASA: 2  Anesthesia type: general          Induction: Intravenous  Anesthetic plan and risks discussed with: Patient      No NSAIDS

## 2018-11-30 NOTE — Anesthesia Post-Procedure Evaluation (Signed)
Procedure(s):  CHOLECYSTECTOMY XI ROBOTIC ASSISTED.    general    Anesthesia Post Evaluation      Multimodal analgesia: multimodal analgesia used between 6 hours prior to anesthesia start to PACU discharge  Patient location during evaluation: PACU  Level of consciousness: sleepy but conscious  Pain management: adequate  Airway patency: patent  Anesthetic complications: no  Cardiovascular status: acceptable  Respiratory status: acceptable  Hydration status: acceptable        INITIAL Post-op Vital signs:   Vitals Value Taken Time   BP 106/65 11/30/2018 11:00 AM   Temp 36.7 ??C (98 ??F) 11/30/2018 10:57 AM   Pulse 88 11/30/2018 11:14 AM   Resp 20 11/30/2018 11:14 AM   SpO2 92 % 11/30/2018 11:14 AM   Vitals shown include unvalidated device data.

## 2018-11-30 NOTE — Progress Notes (Signed)
Progress Note    Patient: Ashley [name] MRN: [id number]  SSN: [id number]    Date of Birth: 06/13/1985  Age: 33 y.o.  Sex: female      Admit Date: 11/27/2018    * No surgery found *    Procedure:      Subjective:     Patient has no new complaints. Feels better.    Objective:     Visit Vitals  BP 115/71 (BP 1 Location: Left arm, BP Patient Position: Supine)   Pulse 85   Temp 98.6 ??F (37 ??C)   Resp 17   SpO2 100%       Temp (24hrs), Avg:98.5 ??F (36.9 ??C), Min:98.1 ??F (36.7 ??C), Max:98.9 ??F (37.2 ??C)      Physical Exam:    ABDOMEN: soft, non-tender. Bowel sounds normal. No masses,  no organomegaly    Data Review: images and reports reviewed    Lab Review:   CMP:   Lab Results   Component Value Date/Time    NA 139 11/30/2018 05:02 AM    K 3.7 11/30/2018 05:02 AM    CL 106 11/30/2018 05:02 AM    CO2 27 11/30/2018 05:02 AM    AGAP 6 11/30/2018 05:02 AM    GLU 68 (L) 11/30/2018 05:02 AM    BUN 3 (L) 11/30/2018 05:02 AM    CREA 0.6 11/30/2018 05:02 AM    GFRAA >60.0 11/30/2018 05:02 AM    GFRNA >60 11/30/2018 05:02 AM    CA 8.8 11/30/2018 05:02 AM    ALB 2.4 (L) 11/30/2018 05:02 AM    TP 6.0 (L) 11/30/2018 05:02 AM    ALT 49 11/30/2018 05:02 AM     CBC:   Lab Results   Component Value Date/Time    WBC 4.1 11/30/2018 05:02 AM    HGB 11.6 (L) 11/30/2018 05:02 AM    HCT 34.7 (L) 11/30/2018 05:02 AM    PLT 208 11/30/2018 05:02 AM     Pancreatic Markers: No results found for: AMYLPOCT, AML, LIPPOCT, LPSE    Assessment:     Hospital Problems  Date Reviewed: 04/11/2018          Codes Class Noted POA    Acute pancreatitis ICD-10-CM: K85.90  ICD-9-CM: 577.0  08/28/2018 Unknown              Plan/Recommendations/Medical Decision Making:     Continue present treatment.  Plan robotic cholecystectomy.  Risks and benefits d/w pt and family.  Plan to OR today.

## 2018-11-30 NOTE — Other (Signed)
Bedside and Verbal shift change report given to Lorre, RN (oncoming nurse) by Rachel, RN (offgoing nurse). Report included the following information SBAR and Kardex.

## 2018-11-30 NOTE — Progress Notes (Signed)
INTERNAL MEDICINE PROGRESS NOTE    Date of note:      November 30, 2018  Patient:               Ashley [name], 33 y.o., female  Admit Date:        11/27/2018  Length of Stay:  3 day(s)    Overnight/24-hour Events:    Reviewed.  Subjective:     S/p cholecystectomy. Soreness around surgical incisions but in good spirits overall  Assessment:           1. Acute pancreatitis -suspect most likely from alcohol use/biliary source. She states that she has reduced amount of alcohol she drinks.  However she has had previous pancreatitis and is more susceptible to further episodes even if she drinks less.  CT Peripancreatic stranding noted on imaging.  Lipase 443.  AST 42 ALT 55 calcium 8.1 TG 117  2. Fatty liver noted on imaging -suspect from alcohol use  3. Macrocytic anemia - suspect secondary to EtOH. B12 wnl. Iron / Ferritin WNL. TIBC slightly low.   4. 5 mm gallbladder polyp and gallbladder sludge noted on ultrasound 9/16.  Pericholecystic fluid.  Murphy sign negative.  S/p Cholecystectomy on 9/18  RUQ ultrasound 08/28/2018: Gallbladder wall polyp.  3.1 x 2.1 cm fluid collection again noted anterior to head of pancreas unchanged from February 2020  5. Questionable UTI??? positive nitrites small LE with 10-10 WBCs 2+ bacteria  6. History of Roux-en-Y gastric bypass  7. Obesity BMI 36  8. Hx of possible duodenal ulcer perforation with possible abscess  Plan:           Stop IVF  ? Continue pain control -stop morphine and continue p.o. pain meds  ? Continue antiemetics  ? Continue Protonix IV  ? Trend hemoglobin and hematocrit  ? Hemoccult negative in the ED we will monitor for evidence of GI bleed.  Hemoglobin 11.7 this a.m.  ? Urine cultures growing Klebsiella only resistant to ampicillin.  Continue on rocephine for UTI   ? GI consulted -we will need close outpatient follow-up with GI  ? Continue thiamine, folic acid and PI  ? General surgery following s/p cholecystectomy    Diet: regular  DVT ppx: heparin     Total clinical care time is 30 minutes, including review of chart including all labs, radiology, past medical history, and discussion with patient and family. Greater than 50% of my time was spent in coordination of care and counseling     Disposition:               home  Objective:   BP 116/73 (BP 1 Location: Left arm, BP Patient Position: Sitting)    Pulse 66    Temp 98.4 ??F (36.9 ??C)    Resp 16    SpO2 100%     General: alert oriented in no apparent distress  Cardiac: Regular rate and rhythm  Lungs: Clear to auscultation  Abdomen: lap incision sites look clean. Some tenderness around the sites. No rebound/guarding.  Bowel sounds present  MSK: no edema  Psych: mood appropriate  Neuro: alert oriented x3. No FNDs   Current Medications:     Current Facility-Administered Medications:   ???  lactated Ringers infusion, 125 mL/hr, IntraVENous, CONTINUOUS, Erskine Emery, MD, Last Rate: 125 mL/hr at 11/30/18 0854  ???  HYDROcodone-acetaminophen (NORCO) 5-325 mg per tablet 1-2 Tab, 1-2 Tab, Oral, Q4H PRN, Erskine Emery, MD, 2 Tab at 11/30/18 1211  ???  therapeutic  multivitamin-minerals (THERAGRAN-M) tablet 1 Tab, 1 Tab, Oral, DAILY, Erskine Emery, MD, Stopped at 11/30/18 0900  ???  pantoprazole (PROTONIX) 40 mg in 0.9% sodium chloride 10 mL injection, 40 mg, IntraVENous, DAILY, Erskine Emery, MD, 40 mg at 11/30/18 0802  ???  ferrous sulfate tablet 325 mg, 325 mg, Oral, DAILY WITH Rogelia Rohrer, MD, Stopped at 16/10/96 0454  ???  folic acid (FOLVITE) tablet 1 mg, 1 mg, Oral, DAILY, Erskine Emery, MD, Stopped at 11/30/18 0900  ???  thiamine HCL (B-1) tablet 100 mg, 100 mg, Oral, DAILY, Erskine Emery, MD, Stopped at 11/30/18 0900  ???  naloxone [facility]) injection 0.1 mg, 0.1 mg, IntraVENous, PRN, Erskine Emery, MD  ???  ondansetron [facility]) injection 4 mg, 4 mg, IntraVENous, Q4H PRN, Erskine Emery, MD, 4 mg at 11/30/18 1211  ???  acetaminophen (TYLENOL) tablet 650 mg, 650 mg, Oral, Q4H PRN **OR**  acetaminophen (TYLENOL) solution 650 mg, 650 mg, Oral, Q4H PRN **OR** [DISCONTINUED] acetaminophen (TYLENOL) suppository 650 mg, 650 mg, Rectal, Q4H PRN, Charlaine Dalton, MD  ???  heparin (porcine) injection 5,000 Units, 5,000 Units, SubCUTAneous, Q8H, Erskine Emery, MD, Stopped at 11/30/18 0700  Labs:     Recent Results (from the past 24 hour(s))   CBC W/O DIFF    Collection Time: 11/30/18  5:02 AM   Result Value Ref Range    WBC 4.1 4.0 - 11.0 1000/mm3    RBC 3.39 (L) 3.60 - 5.20 M/uL    HGB 11.6 (L) 13.0 - 17.2 gm/dl    HCT 34.7 (L) 37.0 - 50.0 %    MCV 102.4 (H) 80.0 - 98.0 fL    MCH 34.2 25.4 - 34.6 pg    MCHC 33.4 30.0 - 36.0 gm/dl    PLATELET 208 140 - 450 1000/mm3    MPV 12.1 (H) 6.0 - 10.0 fL    RDW-SD 50.5 (H) 36.4 - 09.8     METABOLIC PANEL, COMPREHENSIVE    Collection Time: 11/30/18  5:02 AM   Result Value Ref Range    Sodium 139 136 - 145 mEq/L    Potassium 3.7 3.5 - 5.1 mEq/L    Chloride 106 98 - 107 mEq/L    CO2 27 21 - 32 mEq/L    Glucose 68 (L) 74 - 106 mg/dl    BUN 3 (L) 7 - 25 mg/dl    Creatinine 0.6 0.6 - 1.3 mg/dl    GFR est AA >60.0      GFR est non-AA >60      Calcium 8.8 8.5 - 10.1 mg/dl    AST (SGOT) 60 (H) 15 - 37 U/L    ALT (SGPT) 49 12 - 78 U/L    Alk. phosphatase 164 (H) 45 - 117 U/L    Bilirubin, total 0.4 0.2 - 1.0 mg/dl    Protein, total 6.0 (L) 6.4 - 8.2 gm/dl    Albumin 2.4 (L) 3.4 - 5.0 gm/dl    Anion gap 6 5 - 15 mmol/L     Radiology   No results found.     Portions of this electronic record were dictated using Systems analyst. Unintended errors in translation may occur.      Holland Falling, MD  [facility]  November 30, 2018  9:14 AM

## 2018-12-01 LAB — CBC
Hematocrit: 33.9 % — ABNORMAL LOW (ref 37.0–50.0)
Hemoglobin: 11.3 gm/dl — ABNORMAL LOW (ref 13.0–17.2)
MCH: 34.6 pg (ref 25.4–34.6)
MCHC: 33.3 gm/dl (ref 30.0–36.0)
MCV: 103.7 fL — ABNORMAL HIGH (ref 80.0–98.0)
MPV: 12.4 fL — ABNORMAL HIGH (ref 6.0–10.0)
Platelets: 194 10*3/uL (ref 140–450)
RBC: 3.27 M/uL — ABNORMAL LOW (ref 3.60–5.20)
RDW-SD: 51.6 — ABNORMAL HIGH (ref 36.4–46.3)
WBC: 9 10*3/uL (ref 4.0–11.0)

## 2018-12-01 LAB — COMPREHENSIVE METABOLIC PANEL
ALT: 88 U/L — ABNORMAL HIGH (ref 12–78)
AST: 154 U/L — ABNORMAL HIGH (ref 15–37)
Albumin: 2.6 gm/dl — ABNORMAL LOW (ref 3.4–5.0)
Alkaline Phosphatase: 179 U/L — ABNORMAL HIGH (ref 45–117)
Anion Gap: 7 mmol/L (ref 5–15)
BUN: 3 mg/dl — ABNORMAL LOW (ref 7–25)
CO2: 28 mEq/L (ref 21–32)
Calcium: 8.4 mg/dl — ABNORMAL LOW (ref 8.5–10.1)
Chloride: 108 mEq/L — ABNORMAL HIGH (ref 98–107)
Creatinine: 0.8 mg/dl (ref 0.6–1.3)
EGFR IF NonAfrican American: >60
GFR African American: >60
Glucose: 110 mg/dl — ABNORMAL HIGH (ref 74–106)
Potassium: 4 mEq/L (ref 3.5–5.1)
Sodium: 142 mEq/L (ref 136–145)
Total Bilirubin: 0.2 mg/dl (ref 0.2–1.0)
Total Protein: 6.4 gm/dl (ref 6.4–8.2)

## 2018-12-01 LAB — METABOLIC PANEL, COMPREHENSIVE
ALT (SGPT): 88 U/L — ABNORMAL HIGH (ref 12–78)
AST (SGOT): 154 U/L — ABNORMAL HIGH (ref 15–37)
Albumin: 2.6 gm/dl — ABNORMAL LOW (ref 3.4–5.0)
Alk. phosphatase: 179 U/L — ABNORMAL HIGH (ref 45–117)
Anion gap: 7 mmol/L (ref 5–15)
BUN: 3 mg/dl — ABNORMAL LOW (ref 7–25)
Bilirubin, total: 0.2 mg/dl (ref 0.2–1.0)
CO2: 28 mEq/L (ref 21–32)
Calcium: 8.4 mg/dl — ABNORMAL LOW (ref 8.5–10.1)
Chloride: 108 mEq/L — ABNORMAL HIGH (ref 98–107)
Creatinine: 0.8 mg/dl (ref 0.6–1.3)
GFR est AA: >60
GFR est non-AA: >60
Glucose: 110 mg/dl — ABNORMAL HIGH (ref 74–106)
Potassium: 4 mEq/L (ref 3.5–5.1)
Protein, total: 6.4 gm/dl (ref 6.4–8.2)
Sodium: 142 mEq/L (ref 136–145)

## 2018-12-01 LAB — CBC W/O DIFF
HCT: 33.9 % — ABNORMAL LOW (ref 37.0–50.0)
HGB: 11.3 gm/dl — ABNORMAL LOW (ref 13.0–17.2)
MCH: 34.6 pg (ref 25.4–34.6)
MCHC: 33.3 gm/dl (ref 30.0–36.0)
MCV: 103.7 fL — ABNORMAL HIGH (ref 80.0–98.0)
MPV: 12.4 fL — ABNORMAL HIGH (ref 6.0–10.0)
PLATELET: 194 10*3/uL (ref 140–450)
RBC: 3.27 M/uL — ABNORMAL LOW (ref 3.60–5.20)
RDW-SD: 51.6 — ABNORMAL HIGH (ref 36.4–46.3)
WBC: 9 10*3/uL (ref 4.0–11.0)

## 2018-12-01 MED ORDER — HYDROCODONE-ACETAMINOPHEN 5 MG-325 MG TAB
5-325 mg | ORAL_TABLET | Freq: Three times a day (TID) | ORAL | 0 refills | Status: AC | PRN
Start: 2018-12-01 — End: 2018-12-06

## 2018-12-01 MED FILL — HEPARIN (PORCINE) 5,000 UNIT/ML IJ SOLN: 5000 unit/mL | INTRAMUSCULAR | Qty: 1

## 2018-12-01 MED FILL — ONDANSETRON (PF) 4 MG/2 ML INJECTION: 4 mg/2 mL | INTRAMUSCULAR | Qty: 2

## 2018-12-01 MED FILL — HYDROCODONE-ACETAMINOPHEN 5 MG-325 MG TAB: 5-325 mg | ORAL | Qty: 2

## 2018-12-01 MED FILL — VITAMIN B-1 100 MG TABLET: 100 mg | ORAL | Qty: 1

## 2018-12-01 MED FILL — PANTOPRAZOLE 40 MG IV SOLR: 40 mg | INTRAVENOUS | Qty: 40

## 2018-12-01 MED FILL — VITAMINS AND MINERALS TABLET: ORAL | Qty: 1

## 2018-12-01 MED FILL — FOLIC ACID 1 MG TAB: 1 mg | ORAL | Qty: 1

## 2018-12-01 MED FILL — FERROUS SULFATE 325 MG (65 MG ELEMENTAL IRON) TAB: 325 mg (65 mg iron) | ORAL | Qty: 1

## 2018-12-01 NOTE — Discharge Summary (Signed)
Discharge  Summary by Holland Falling, MD at 12/01/18 1003                Author: Holland Falling, MD  Service: Hospitalist  Author Type: Physician       Filed: 12/01/18 1238  Date of Service: 12/01/18 1003  Status: Signed          Editor: Holland Falling, MD (Physician)                          INTERNAL MEDICINE DISCHARGE SUMMARY         Patient ID:   Ashley [name], 33 y.o.,  female   DOB: 10-30-1985      PCP:  Roosevelt Locks Da, MD      Admit Date: 11/27/2018  6:17 PM   Discharge Date:  12/01/2018   Discharge Disposition:          Chief Complaint       Patient presents with        ?  Abdominal Pain             Assessment:        1.    Acute pancreatitis -suspect most likely from alcohol use/biliary source.    2.  Fatty liver noted on imaging -suspect from alcohol use   3.  Macrocytic anemia - suspect secondary to EtOH. B12 wnl. Iron / Ferritin WNL. TIBC slightly low.    4.  5 mm gallbladder polyp and gallbladder sludge noted on ultrasound 9/16.  Pericholecystic fluid.  Murphy sign negative.  S/p Cholecystectomy on 9/18   RUQ ultrasound 08/28/2018: Gallbladder wall polyp.  3.1 x 2.1 cm fluid collection again noted anterior to head of pancreas unchanged from February 2020   5.  History of Roux-en-Y gastric bypass   6.  Obesity BMI 36   7.  Hx of possible duodenal ulcer perforation with possible abscess          HPI, per admitting physician:      Ashley [name] is a 33 y.o. BL[place] female who presents with worsening abdominal pain   Patient has history of alcohol abuse, has been admitted to the hospital for pancreatitis/gastritis secondary to alcohol abuse states that she has no longer drinking since last admission   She has noticed 3-day worth epigastric pain radiating to the back also has been having some black stools nausea vomiting   He said she has had a toothache last week and has been taking ibuprofen around-the-clock     Hospital Course:             33 year old female presented to the hospital complaining  abdominal pain and was admitted for acute pancreatitis.  Presentation lipase was elevated with CT abdomen consistent with  acute pancreatitis.  Fatty infiltration of the liver also noted.  Patient is status post gastric bypass.  Right upper quadrant ultrasound showed 5 mm gallbladder polyp and gallbladder sludge with pericholecystic fluid.  With known pancreatitis by CT.   Patient was made n.p.o. and started on IV fluids and managed with fluids, bowel rest, pain control.  GI were consulted.  Studies were checked which were normal.  B12 levels are also normal.  Folate is pending.  Recommendation was to continue medical  management.  General surgery were consulted for gallbladder polyp.  Patient underwent cholecystectomy on 7/32 without complications.  She was able to tolerate diet well subsequently.  Today she is alert oriented  and tolerating p.o. intake well without  any new complaints.  She is stable for discharge with the following recommendations:      1.  Follow-up with PCP within 1 week   2. Follow-up with general surgery within 2 weeks   3. Follow-up with GI within 3 to 4 weeks   4.  Short supply of pain medications have been provided per patient request.  Return to work note also provided per patient request.        Hospital course and discharge diagnoses were discussed with Ashley [name]. She is copacetic with discharge plan. I answered any questions that the patient  and/or family had.            Follow-Up Appointments:          Follow-up Information               Follow up With  Specialties  Details  Why  Contact Info              Tiongco, Shaune Leeks, MD  Gastroenterology, Internal Medicine  Schedule an appointment as soon as possible for a visit in 2 weeks    [address]   [place] [address]   [phone]                 Erskine Emery, MD  Surgery  Schedule an appointment as soon as possible for a visit in 2 weeks    [address]   [address]   [place] [address]    [phone]                     Discharge Medications:          Current Discharge Medication List                    Ancillary:        Condition at discharge:   Afebrile   Ambulating   Eating, Drinking, Voiding   Stable      Consultants/Treatment Team:     Treatment Team: Attending Provider: Holland Falling, MD; Consulting Provider: Charlaine Dalton, MD; Consulting Provider: Holland Falling, MD; Care Manager: Malachi Carl; Consulting Provider: Tanda Rockers, MD; Primary Nurse: Cordie Grice      Most Recent Labs:     Recent Results (from the past 24 hour(s))     CULTURE, URINE          Collection Time: 11/30/18  6:20 PM       Specimen: Clean catch; Urine         Result  Value  Ref Range            Culture result  No Growth To Date          CBC W/O DIFF          Collection Time: 12/01/18  3:01 AM         Result  Value  Ref Range            WBC  9.0  4.0 - 11.0 1000/mm3       RBC  3.27 (L)  3.60 - 5.20 M/uL       HGB  11.3 (L)  13.0 - 17.2 gm/dl       HCT  33.9 (L)  37.0 - 50.0 %       MCV  103.7 (H)  80.0 - 98.0 fL  MCH  34.6  25.4 - 34.6 pg       MCHC  33.3  30.0 - 36.0 gm/dl       PLATELET  194  140 - 450 1000/mm3       MPV  12.4 (H)  6.0 - 10.0 fL       RDW-SD  51.6 (H)  36.4 - 96.2         METABOLIC PANEL, COMPREHENSIVE          Collection Time: 12/01/18  3:01 AM         Result  Value  Ref Range            Sodium  142  136 - 145 mEq/L       Potassium  4.0  3.5 - 5.1 mEq/L       Chloride  108 (H)  98 - 107 mEq/L       CO2  28  21 - 32 mEq/L       Glucose  110 (H)  74 - 106 mg/dl       BUN  3 (L)  7 - 25 mg/dl       Creatinine  0.8  0.6 - 1.3 mg/dl       GFR est AA  >60.0          GFR est non-AA  >60          Calcium  8.4 (L)  8.5 - 10.1 mg/dl       AST (SGOT)  154 (H)  15 - 37 U/L       ALT (SGPT)  88 (H)  12 - 78 U/L       Alk. phosphatase  179 (H)  45 - 117 U/L       Bilirubin, total  0.2  0.2 - 1.0 mg/dl       Protein, total  6.4  6.4 - 8.2 gm/dl       Albumin  2.6 (L)  3.4 - 5.0 gm/dl            Anion  gap  7  5 - 15 mmol/L              Total discharge time 35 minutes.       Portions of this electronic record were dictated using Systems analyst. Unintended errors in translation may occur.         Holland Falling, MD   [facility]   December 01, 2018   10:03 AM

## 2018-12-01 NOTE — Progress Notes (Signed)
Doing ok.  Excess stitch removed.  Mild drainage at incisions.  Ok to D/C home.  See me in 2 weeks.

## 2018-12-01 NOTE — Progress Notes (Signed)
resolved 

## 2018-12-01 NOTE — Progress Notes (Signed)
1208 - Paged Dr. Welton Flakes  PAGER IA:[phone]  [id number] Ashley [name], Ashley [name] Are there any prescriptions for pt before she goes home? Requesting pain meds. Please advise. [phone].

## 2018-12-01 NOTE — Progress Notes (Signed)
May have low fat diet no high fat foods report to doctor if you have temperature of 101 or higher  Need to walk 2 to 3 times per day may shower but no soaking in bath tub

## 2018-12-01 NOTE — Progress Notes (Signed)
Patient using oral pain medication as ordered for pain management. No signs of distress noted. Will continue to monitor patient throughout the shift.

## 2018-12-01 NOTE — Progress Notes (Signed)
Discharge Plan:  Home     Discharge Date:     12/01/2018     Is patient going to snf?  na  Home Health Needed:   na    DME needed and ordered for Discharge:   na    TCC Referral:    na    Medication Assistance given   na  Change(MDC/SSDI) Referral/outcome   na    Transportation: Family/ Medical    Family

## 2018-12-01 NOTE — Progress Notes (Signed)
Ashley [name] has been in the hospital from 9/15 till 9/19. She is medically stable to return to work from 9/21 and should return to work with light duties for the next 2 weeks.    Rande Brunt, MD  [facility]

## 2018-12-01 NOTE — Progress Notes (Signed)
Patient using oral pain medication as ordered for pain management. No signs of distress noted. Will continue to monitor patient throughout the shift.

## 2018-12-01 NOTE — Other (Addendum)
----------  DocumentID: [id number]------------------------------------------------              [facility]                       Patient Education Report         Name: Ashley [name], Ashley [name]                  Date: 11/28/2018    MRN: [id number]                    Time: 1:37:39 AM         Patient ordered video: 'Patient Safety: Stay Safe While you are in the Hospital'    from [id number] via phone number: [phone] at 1:37:39 AM    Description: This program outlines some of the precautions patients can take to ensure a speedy recovery without extra complications. The video emphasizes the importance of communicating with the healthcare team.    ----------DocumentID: [id number]------------------------------------------------                       [facility]          Patient Education Report - Discharge Summary        Date: 12/01/2018   Time: 12:32:11 PM   Name: Ashley [name], Ashley [name]   MRN: [id number]      Account Number: [id number]      Education History:        Patient ordered video: 'Patient Safety: Stay Safe While you are in the Hospital' from [id number] on 11/28/2018 01:37:39 AM

## 2018-12-01 NOTE — Progress Notes (Signed)
1208 - Paged Dr. Khan  PAGER ID:[phone]  [phone] [name], Ashley Are there any prescriptions for pt before she goes home? Requesting pain meds. Please advise. [phone].

## 2018-12-01 NOTE — Progress Notes (Signed)
Doing ok.  Excess stitch removed.  Mild drainage at incisions.  Ok to D/C home.  See me in 2 weeks.

## 2018-12-01 NOTE — Discharge Summary (Signed)
INTERNAL MEDICINE DISCHARGE SUMMARY      Patient ID:  Ashley [name], 33 y.o., female  DOB: [birth year]    PCP:  Roosevelt Locks Da, MD    Admit Date: 11/27/2018  6:17 PM  Discharge Date:  12/01/2018  Discharge Disposition:      Chief Complaint   Patient presents with   ??? Abdominal Pain       Assessment:     1. Acute pancreatitis -suspect most likely from alcohol use/biliary source.   2. Fatty liver noted on imaging -suspect from alcohol use  3. Macrocytic anemia - suspect secondary to EtOH. B12 wnl. Iron / Ferritin WNL. TIBC slightly low.   4. 5 mm gallbladder polyp and gallbladder sludge noted on ultrasound 9/16.  Pericholecystic fluid.  Murphy sign negative.  S/p Cholecystectomy on 9/18  RUQ ultrasound 08/28/2018: Gallbladder wall polyp.  3.1 x 2.1 cm fluid collection again noted anterior to head of pancreas unchanged from February 2020  5. History of Roux-en-Y gastric bypass  6. Obesity BMI 36  7. Hx of possible duodenal ulcer perforation with possible abscess     HPI, per admitting physician:    Ashley [name] is a 33 y.o. BL[place] female who presents with worsening abdominal pain  Patient has history of alcohol abuse, has been admitted to the hospital for pancreatitis/gastritis secondary to alcohol abuse states that she has no longer drinking since last admission  She has noticed 3-day worth epigastric pain radiating to the back also has been having some black stools nausea vomiting  He said she has had a toothache last week and has been taking ibuprofen around-the-clock  Hospital Course:           33 year old female presented to the hospital complaining abdominal pain and was admitted for acute pancreatitis.  Presentation lipase was elevated with CT abdomen consistent with acute pancreatitis.  Fatty infiltration of the liver also noted.  Patient is status post gastric bypass.  Right upper quadrant ultrasound showed 5 mm gallbladder polyp and gallbladder sludge  with pericholecystic fluid.  With known pancreatitis by CT.  Patient was made n.p.o. and started on IV fluids and managed with fluids, bowel rest, pain control.  GI were consulted.  Studies were checked which were normal.  B12 levels are also normal.  Folate is pending.  Recommendation was to continue medical management.  General surgery were consulted for gallbladder polyp.  Patient underwent cholecystectomy on 7/56 without complications.  She was able to tolerate diet well subsequently.  Today she is alert oriented and tolerating p.o. intake well without any new complaints.  She is stable for discharge with the following recommendations:    1.  Follow-up with PCP within 1 week  2. Follow-up with general surgery within 2 weeks  3. Follow-up with GI within 3 to 4 weeks  4.  Short supply of pain medications have been provided per patient request.  Return to work note also provided per patient request.    Hospital course and discharge diagnoses were discussed with [name]. She is copacetic with discharge plan. I answered any questions that the patient and/or family had.       Follow-Up Appointments:     Follow-up Information     Follow up With Specialties Details Why Contact Info    Tiongco, Shaune Leeks, MD Gastroenterology, Internal Medicine Schedule an appointment as soon as possible for a visit in 2 weeks  [place]  [place]  [place]      Erskine Emery, MD Surgery Schedule an appointment as soon as possible for a visit in 2 weeks  [address]  [address]  [place] [address]  [phone]            Discharge Medications:     Current Discharge Medication List          Ancillary:     Condition at discharge:  Afebrile  Ambulating  Eating, Drinking, Voiding  Stable    Consultants/Treatment Team:    Treatment Team: Attending Provider: Holland Falling, MD; Consulting Provider: Charlaine Dalton, MD; Consulting Provider: Holland Falling, MD; Care Manager:  Malachi Carl; Consulting Provider: Tanda Rockers, MD; Primary Nurse: Cordie Grice    Most Recent Labs:  Recent Results (from the past 24 hour(s))   CULTURE, URINE    Collection Time: 11/30/18  6:20 PM    Specimen: Clean catch; Urine   Result Value Ref Range    Culture result No Growth To Date     CBC W/O DIFF    Collection Time: 12/01/18  3:01 AM   Result Value Ref Range    WBC 9.0 4.0 - 11.0 1000/mm3    RBC 3.27 (L) 3.60 - 5.20 M/uL    HGB 11.3 (L) 13.0 - 17.2 gm/dl    HCT 33.9 (L) 37.0 - 50.0 %    MCV 103.7 (H) 80.0 - 98.0 fL    MCH 34.6 25.4 - 34.6 pg    MCHC 33.3 30.0 - 36.0 gm/dl    PLATELET 194 140 - 450 1000/mm3    MPV 12.4 (H) 6.0 - 10.0 fL    RDW-SD 51.6 (H) 36.4 - 63.8     METABOLIC PANEL, COMPREHENSIVE    Collection Time: 12/01/18  3:01 AM   Result Value Ref Range    Sodium 142 136 - 145 mEq/L    Potassium 4.0 3.5 - 5.1 mEq/L    Chloride 108 (H) 98 - 107 mEq/L    CO2 28 21 - 32 mEq/L    Glucose 110 (H) 74 - 106 mg/dl    BUN 3 (L) 7 - 25 mg/dl    Creatinine 0.8 0.6 - 1.3 mg/dl    GFR est AA >60.0      GFR est non-AA >60      Calcium 8.4 (L) 8.5 - 10.1 mg/dl    AST (SGOT) 154 (H) 15 - 37 U/L    ALT (SGPT) 88 (H) 12 - 78 U/L    Alk. phosphatase 179 (H) 45 - 117 U/L    Bilirubin, total 0.2 0.2 - 1.0 mg/dl    Protein, total 6.4 6.4 - 8.2 gm/dl    Albumin 2.6 (L) 3.4 - 5.0 gm/dl    Anion gap 7 5 - 15 mmol/L         Total discharge time 35 minutes.     Portions of this electronic record were dictated using Systems analyst. Unintended errors in translation may occur.      Holland Falling, MD  [facility]  December 01, 2018  10:03 AM

## 2018-12-01 NOTE — Progress Notes (Signed)
Discharge Plan:  Home     Discharge Date:     12/01/2018     Is patient going to snf?  na  Home Health Needed:   na    DME needed and ordered for Discharge:   na    TCC Referral:    na    Medication Assistance given   na  Change(MDC/SSDI) Referral/outcome   na    Transportation: Family/ Medical    Family

## 2018-12-01 NOTE — Progress Notes (Signed)
Ashley [name] has been in the hospital from 9/15 till 9/19. She is medically stable to return to work from 9/21 and should return to work with light duties for the next 2 weeks.    Price Lachapelle, MD  [facility]

## 2018-12-01 NOTE — Other (Signed)
Bedside and Verbal shift change report given to Arielle C Flom   (oncoming nurse) by Kimberly, RN (offgoing nurse). Report included the following information SBAR, Kardex, MAR and Recent Results.

## 2018-12-01 NOTE — Progress Notes (Signed)
May have low fat diet no high fat foods report to doctor if you have temperature of 101 or higher  Need to walk 2 to 3 times per day may shower but no soaking in bath tub

## 2018-12-02 LAB — CULTURE, URINE
CULTURE RESULT: 30000 — AB
Culture result: 30000 — AB

## 2020-10-20 ENCOUNTER — Emergency Department (HOSPITAL_COMMUNITY)
Admission: EM | Admit: 2020-10-20 | Discharge: 2020-10-20 | Disposition: A | Discharge status: Home / Self Care | Payer: Medicaid Other | Attending: Emergency Medicine | Admitting: Emergency Medicine

## 2020-10-20 ENCOUNTER — Encounter (HOSPITAL_COMMUNITY): Payer: Self-pay | Admitting: *Deleted

## 2020-10-20 ENCOUNTER — Other Ambulatory Visit (HOSPITAL_COMMUNITY): Payer: Self-pay

## 2020-10-20 ENCOUNTER — Emergency Department (HOSPITAL_COMMUNITY): Payer: Medicaid Other

## 2020-10-20 ENCOUNTER — Other Ambulatory Visit: Payer: Self-pay

## 2020-10-20 DIAGNOSIS — K2900 Acute gastritis without bleeding: Secondary | ICD-10-CM | POA: Insufficient documentation

## 2020-10-20 DIAGNOSIS — R1011 Right upper quadrant pain: Secondary | ICD-10-CM | POA: Diagnosis present

## 2020-10-20 DIAGNOSIS — R630 Anorexia: Secondary | ICD-10-CM | POA: Insufficient documentation

## 2020-10-20 LAB — LIPASE, BLOOD: Lipase: 52 U/L — ABNORMAL HIGH (ref 11–51)

## 2020-10-20 LAB — CBC
HCT: 38.4 % (ref 36.0–46.0)
Hemoglobin: 13 g/dL (ref 12.0–15.0)
MCH: 32.2 pg (ref 26.0–34.0)
MCHC: 33.9 g/dL (ref 30.0–36.0)
MCV: 95 fL (ref 80.0–100.0)
Platelets: 266 10*3/uL (ref 150–400)
RBC: 4.04 MIL/uL (ref 3.87–5.11)
RDW: 13.4 % (ref 11.5–15.5)
WBC: 7.3 10*3/uL (ref 4.0–10.5)
nRBC: 0 % (ref 0.0–0.2)

## 2020-10-20 LAB — COMPREHENSIVE METABOLIC PANEL
ALT: 17 U/L (ref 0–44)
AST: 20 U/L (ref 15–41)
Albumin: 4.1 g/dL (ref 3.5–5.0)
Alkaline Phosphatase: 140 U/L — ABNORMAL HIGH (ref 38–126)
Anion gap: 9 (ref 5–15)
BUN: 11 mg/dL (ref 6–20)
CO2: 22 mmol/L (ref 22–32)
Calcium: 9.1 mg/dL (ref 8.9–10.3)
Chloride: 107 mmol/L (ref 98–111)
Creatinine, Ser: 0.71 mg/dL (ref 0.44–1.00)
GFR, Estimated: >60 mL/min (ref >60)
Glucose, Bld: 95 mg/dL (ref 70–99)
Potassium: 3.8 mmol/L (ref 3.5–5.1)
Sodium: 138 mmol/L (ref 135–145)
Total Bilirubin: 1 mg/dL (ref 0.3–1.2)
Total Protein: 7.5 g/dL (ref 6.5–8.1)

## 2020-10-20 LAB — I-STAT BETA HCG BLOOD, ED (MC, WL, AP ONLY): I-stat hCG, quantitative: <5 m[IU]/mL (ref <5)

## 2020-10-20 MED ORDER — ONDANSETRON HCL 4 MG/2ML IJ SOLN
4.0000 mg | Freq: Once | INTRAMUSCULAR | Status: AC
  Administered 2020-10-20: 4 mg via INTRAVENOUS
  Filled 2020-10-20: qty 2

## 2020-10-20 MED ORDER — MORPHINE SULFATE (PF) 4 MG/ML IV SOLN
4.0000 mg | Freq: Once | INTRAVENOUS | Status: AC
Start: 2020-10-20 — End: 2020-10-20
  Administered 2020-10-20: 4 mg via INTRAVENOUS
  Filled 2020-10-20: qty 1

## 2020-10-20 MED ORDER — SODIUM CHLORIDE 0.9 % IV BOLUS
1000.0000 mL | Freq: Once | INTRAVENOUS | Status: AC
  Administered 2020-10-20: 1000 mL via INTRAVENOUS

## 2020-10-20 MED ORDER — DICYCLOMINE HCL 20 MG PO TABS
20.0000 mg | ORAL_TABLET | Freq: Two times a day (BID) | ORAL | 0 refills | Status: DC
  Filled 2020-10-20: qty 10, 5d supply, fill #0

## 2020-10-20 MED ORDER — DICYCLOMINE HCL 10 MG/ML IM SOLN
20.0000 mg | Freq: Once | INTRAMUSCULAR | Status: AC
  Administered 2020-10-20: 20 mg via INTRAMUSCULAR
  Filled 2020-10-20: qty 2

## 2020-10-20 MED ORDER — ONDANSETRON 4 MG PO TBDP
4.0000 mg | ORAL_TABLET | Freq: Three times a day (TID) | ORAL | 0 refills | Status: DC | PRN
  Filled 2020-10-20: qty 4, 2d supply, fill #0

## 2020-10-20 MED ORDER — FAMOTIDINE 20 MG PO TABS
20.0000 mg | ORAL_TABLET | Freq: Two times a day (BID) | ORAL | 0 refills | Status: DC
  Filled 2020-10-20: qty 30, 15d supply, fill #0

## 2020-10-20 MED ORDER — HYDROCODONE-ACETAMINOPHEN 5-325 MG PO TABS
1.0000 | ORAL_TABLET | Freq: Four times a day (QID) | ORAL | 0 refills | Status: DC | PRN
  Filled 2020-10-20: qty 4, 1d supply, fill #0

## 2020-10-20 NOTE — ED Provider Notes (Addendum)
[facility]-EMERGENCY DEPT Provider Note   CSN: [id number] Arrival date & time: 10/20/20  1252     History Chief Complaint  Patient presents with   Abdominal Pain    Maureen Torres is a 35 y.o. female who is status postcholecystectomy, gastric bypass and C-sections presenting to the ED with a chief complaint of abdominal pain.  Symptoms began yesterday.  Started with back pain the day before but has persisted and now abdominal pain.  Pain is in her right upper quadrant and epigastric area but radiates throughout her entire upper abdomen.  Denies any chest pain or shortness of breath.  She reports associated bloating, nausea and decreased appetite.  Reports frequent bowel movements but no bloody stools.  Minimal improvement noted with over-the-counter medication.  Denies any fever.  HPI     History reviewed. No pertinent past medical history.  There are no problems to display for this patient.   History reviewed. No pertinent surgical history.   OB History   No obstetric history on file.     No family history on file.     Home Medications Prior to Admission medications   Medication Sig Start Date End Date Taking? Authorizing Provider  acetaminophen (TYLENOL) 500 MG tablet Take 1,000 mg by mouth every 6 (six) hours as needed for mild pain.   Yes [provider]  dicyclomine (BENTYL) 20 MG tablet Take 1 tablet (20 mg total) by mouth 2 (two) times daily. 10/20/20  Yes , , PA-C  famotidine (PEPCID) 20 MG tablet Take 1 tablet (20 mg total) by mouth 2 (two) times daily. 10/20/20  Yes , , PA-C  ibuprofen (ADVIL) 200 MG tablet Take 400 mg by mouth every 6 (six) hours as needed for mild pain.   Yes [provider]  Multiple Vitamin (MULTIVITAMIN) tablet Take 1 tablet by mouth daily.   Yes [provider]  ondansetron (ZOFRAN ODT) 4 MG disintegrating tablet Take 1 tablet (4 mg total) by mouth every 8 (eight) hours as needed  for nausea or vomiting. 10/20/20  Yes , , PA-C    Allergies    Aspirin, Ibuprofen, and Nsaids  Review of Systems   Review of Systems  Constitutional:  Positive for appetite change. Negative for chills and fever.  HENT:  Negative for ear pain, rhinorrhea, sneezing and sore throat.   Eyes:  Negative for photophobia and visual disturbance.  Respiratory:  Negative for cough, chest tightness, shortness of breath and wheezing.   Cardiovascular:  Negative for chest pain and palpitations.  Gastrointestinal:  Positive for abdominal pain and nausea. Negative for blood in stool, constipation, diarrhea and vomiting.  Genitourinary:  Negative for dysuria, hematuria and urgency.  Musculoskeletal:  Negative for myalgias.  Skin:  Negative for rash.  Neurological:  Negative for dizziness, weakness and light-headedness.   Physical Exam Updated Vital Signs BP (!) 128/116   Pulse 85   Temp (!) 97.5 F (36.4 C) (Oral)   Resp (!) 22   SpO2 100%   Physical Exam Vitals and nursing note reviewed.  Constitutional:      General: She is not in acute distress.    Appearance: She is well-developed.  HENT:     Head: Normocephalic and atraumatic.     Nose: Nose normal.  Eyes:     General: No scleral icterus.       Left eye: No discharge.     Conjunctiva/sclera: Conjunctivae normal.  Cardiovascular:     Rate and Rhythm: Normal  rate and regular rhythm.     Heart sounds: Normal heart sounds. No murmur heard.   No friction rub. No gallop.  Pulmonary:     Effort: Pulmonary effort is normal. No respiratory distress.     Breath sounds: Normal breath sounds.  Abdominal:     General: Bowel sounds are normal. There is no distension.     Palpations: Abdomen is soft.     Tenderness: There is no abdominal tenderness. There is no guarding.  Musculoskeletal:        General: Normal range of motion.     Cervical back: Normal range of motion and neck supple.  Skin:    General: Skin is warm and dry.      Findings: No rash.  Neurological:     Mental Status: She is alert.     Motor: No abnormal muscle tone.     Coordination: Coordination normal.    ED Results / Procedures / Treatments   Labs (all labs ordered are listed, but only abnormal results are displayed) Labs Reviewed  LIPASE, BLOOD - Abnormal; Notable for the following components:      Result Value   Lipase 52 (*)    All other components within normal limits  COMPREHENSIVE METABOLIC PANEL - Abnormal; Notable for the following components:   Alkaline Phosphatase 140 (*)    All other components within normal limits  CBC  I-STAT BETA HCG BLOOD, ED ([facility], [facility], [facility] ONLY)    EKG None  Radiology US Abdomen Limited RUQ (LIVER/GB)  Result Date: 10/20/2020 CLINICAL DATA:  Right upper quadrant pain EXAM: ULTRASOUND ABDOMEN LIMITED RIGHT UPPER QUADRANT COMPARISON:  None. FINDINGS: Gallbladder: Cholecystectomy. Common bile duct: Diameter: 7 mm, within normal limits Liver: No focal lesion identified. Within normal limits in parenchymal echogenicity. Portal vein is patent on color Doppler imaging with normal direction of blood flow towards the liver. Other: None. IMPRESSION: Unremarkable right upper quadrant ultrasound.  Post cholecystectomy. Electronically Signed   By: Guadlupe Spanish M.D.   On: 10/20/2020 14:14    Procedures Procedures   Medications Ordered in ED Medications  dicyclomine (BENTYL) injection 20 mg (has no administration in time range)  ondansetron (ZOFRAN) injection 4 mg (4 mg Intravenous Given 10/20/20 1412)  morphine 4 MG/ML injection 4 mg (4 mg Intravenous Given 10/20/20 1413)  sodium chloride 0.9 % bolus 1,000 mL (1,000 mLs Intravenous New Bag/Given 10/20/20 1413)    ED Course  I have reviewed the triage vital signs and the nursing notes.  Pertinent labs & imaging results that were available during my care of the patient were reviewed by me and considered in my medical decision making (see chart for details).  Clinical  Course as of 10/20/20 1542  Tue Oct 20, 2020  1337 I-stat hCG, quantitative: <5.0 [HK]  1340 WBC: 7.3 [HK]  1359 WBC: 7.3 [HK]  1420 US Abdomen Limited RUQ (LIVER/GB) No acute findings. [HK]  1427 Lipase(!): 52 [HK]  1427 Creatinine: 0.71 [HK]    Clinical Course User Index [HK] Dietrich Pates, PA-C   MDM Rules/Calculators/A&P                           35 year old female status postcholecystectomy and gastric bypass and C-sections presenting to the ED for abdominal pain.  Reports symptoms began yesterday.  Pain is in right upper quadrant epigastric area but really radiates throughout her entire upper abdomen.  No chest pain or shortness of breath.  Reports bloating,  nausea and decreased appetite.  Denies bloody stools.  On exam abdomen is soft.  Vital signs within normal limits.  Right upper quadrant ultrasound was ordered in triage and this was negative for acute abnormality.  Blood work including CBC, CMP and lipase unremarkable.  Patient given medications here with resolution of her symptoms.  Repeat abdominal exams remain benign.  I suspect her symptoms could be due to gastritis based on her physical exam findings and reassuring work-up as well as her improvement.  I doubt obstruction as she is not vomiting and is having normal bowel movements.  She has a history of pancreatitis that she tells me at recheck.  Lipase is 52 today as this could be the very early start of it.  I offered additional imaging but she declines.  We will treat with short course of pain medication.  We will have her follow-up with GI for symptoms persist but told to return for any severe worsening symptoms that may require further work-up.    Patient is hemodynamically stable, in NAD, and able to ambulate in the ED. Evaluation does not show pathology that would require ongoing emergent intervention or inpatient treatment. I explained the diagnosis to the patient. Pain has been managed and has no complaints prior to  discharge. Patient is comfortable with above plan and is stable for discharge at this time. All questions were answered prior to disposition. Strict return precautions for returning to the ED were discussed. Encouraged follow up with PCP.   Prior to providing a prescription for a controlled substance, I independently reviewed the patient's recent prescription history on the [place] Controlled Substance Reporting System. The patient had no recent or regular prescriptions and was deemed appropriate for a brief, less than 3 day prescription of narcotic for acute analgesia.  An After Visit Summary was printed and given to the patient.   Portions of this note were generated with Scientist, clinical (histocompatibility and immunogenetics). Dictation errors may occur despite best attempts at proofreading.  Final Clinical Impression(s) / ED Diagnoses Final diagnoses:  RUQ pain  Acute gastritis without hemorrhage, unspecified gastritis type    Rx / DC Orders ED Discharge Orders          Ordered    famotidine (PEPCID) 20 MG tablet  2 times daily        10/20/20 1500    ondansetron (ZOFRAN ODT) 4 MG disintegrating tablet  Every 8 hours PRN        10/20/20 1500    dicyclomine (BENTYL) 20 MG tablet  2 times daily        10/20/20 1500              Dietrich Pates, PA-C 10/20/20 1543    Bethann Berkshire, MD 10/25/20 1008

## 2020-10-20 NOTE — Discharge Instructions (Addendum)
Follow-up with the GI doctor listed below. Take the medications as needed With your symptoms. Make sure you are treating plenty of fluids and slowly advance your diet as tolerated.  Avoid heavy, acidic or fatty foods. Return to the ER if you start to experience worsening pain, continued vomiting, bloody stools or vomiting, chest pain or shortness of breath.

## 2020-10-20 NOTE — ED Triage Notes (Signed)
Pt complains of abdominal pain/bloating, nausea, dry heaves since yesterday. Occasional diarrhea since yesterday.

## 2020-10-20 NOTE — ED Provider Notes (Signed)
Emergency Medicine Provider Triage Evaluation Note  Maureen Torres , a 35 y.o. female  was evaluated in triage.  Pt complains of right upper quadrant pain since yesterday with abdominal bloating, nausea, dry heaves and anorexia.  Small frequent bowel movements since yesterday without melena or hematochezia.  History of cholecystectomy.  Review of Systems  Positive: Abdominal pain, nausea Negative: Shortness of breath, fevers, chills, chest pain  Physical Exam  BP (!) 139/92 (BP Location: Left Arm)   Pulse 78   Temp (!) 97.5 F (36.4 C) (Oral)   Resp (!) 22   SpO2 100%  Gen:   Awake, tearful, clearly uncomfortable Resp:  Normal effort  MSK:   Moves extremities without difficulty  Other:  Abdomen soft, nondistended, tender in the right upper quadrant and epigastrium only.  No CVAT.  Medical Decision Making  Medically screening exam initiated at 1:10 PM.  Appropriate orders placed.  Darci Lykins was informed that the remainder of the evaluation will be completed by another provider, this initial triage assessment does not replace that evaluation, and the importance of remaining in the ED until their evaluation is complete.  This chart was dictated using voice recognition software, Dragon. Despite the best efforts of this provider to proofread and correct errors, errors may still occur which can change documentation meaning.    Paris Lore, PA-C 10/20/20 1311    Edwin Dada P, DO 10/20/20 1742

## 2020-10-21 ENCOUNTER — Inpatient Hospital Stay (HOSPITAL_COMMUNITY)
Admission: EM | Admit: 2020-10-21 | Discharge: 2020-10-25 | DRG: 440 | Disposition: A | Discharge status: Home / Self Care | Payer: Medicaid Other | Attending: Internal Medicine | Admitting: Internal Medicine

## 2020-10-21 ENCOUNTER — Other Ambulatory Visit: Payer: Self-pay

## 2020-10-21 ENCOUNTER — Encounter (HOSPITAL_COMMUNITY): Payer: Self-pay | Admitting: Emergency Medicine

## 2020-10-21 ENCOUNTER — Emergency Department (HOSPITAL_COMMUNITY): Payer: Medicaid Other

## 2020-10-21 DIAGNOSIS — R112 Nausea with vomiting, unspecified: Secondary | ICD-10-CM

## 2020-10-21 DIAGNOSIS — R002 Palpitations: Secondary | ICD-10-CM | POA: Diagnosis present

## 2020-10-21 DIAGNOSIS — K859 Acute pancreatitis without necrosis or infection, unspecified: Secondary | ICD-10-CM | POA: Diagnosis present

## 2020-10-21 DIAGNOSIS — Z9049 Acquired absence of other specified parts of digestive tract: Secondary | ICD-10-CM

## 2020-10-21 DIAGNOSIS — Z87891 Personal history of nicotine dependence: Secondary | ICD-10-CM

## 2020-10-21 DIAGNOSIS — Z8711 Personal history of peptic ulcer disease: Secondary | ICD-10-CM

## 2020-10-21 DIAGNOSIS — Z20822 Contact with and (suspected) exposure to covid-19: Secondary | ICD-10-CM | POA: Diagnosis present

## 2020-10-21 DIAGNOSIS — Z9884 Bariatric surgery status: Secondary | ICD-10-CM

## 2020-10-21 DIAGNOSIS — Z886 Allergy status to analgesic agent status: Secondary | ICD-10-CM

## 2020-10-21 DIAGNOSIS — K852 Alcohol induced acute pancreatitis without necrosis or infection: Principal | ICD-10-CM | POA: Diagnosis present

## 2020-10-21 LAB — COMPREHENSIVE METABOLIC PANEL
ALT: 14 U/L (ref 0–44)
AST: 17 U/L (ref 15–41)
Albumin: 3.9 g/dL (ref 3.5–5.0)
Alkaline Phosphatase: 144 U/L — ABNORMAL HIGH (ref 38–126)
Anion gap: 11 (ref 5–15)
BUN: 7 mg/dL (ref 6–20)
CO2: 19 mmol/L — ABNORMAL LOW (ref 22–32)
Calcium: 9.1 mg/dL (ref 8.9–10.3)
Chloride: 103 mmol/L (ref 98–111)
Creatinine, Ser: 0.6 mg/dL (ref 0.44–1.00)
GFR, Estimated: >60 mL/min (ref >60)
Glucose, Bld: 88 mg/dL (ref 70–99)
Potassium: 3.9 mmol/L (ref 3.5–5.1)
Sodium: 133 mmol/L — ABNORMAL LOW (ref 135–145)
Total Bilirubin: 1.6 mg/dL — ABNORMAL HIGH (ref 0.3–1.2)
Total Protein: 7.3 g/dL (ref 6.5–8.1)

## 2020-10-21 LAB — CBC
HCT: 39.1 % (ref 36.0–46.0)
Hemoglobin: 13.1 g/dL (ref 12.0–15.0)
MCH: 32.3 pg (ref 26.0–34.0)
MCHC: 33.5 g/dL (ref 30.0–36.0)
MCV: 96.3 fL (ref 80.0–100.0)
Platelets: 250 10*3/uL (ref 150–400)
RBC: 4.06 MIL/uL (ref 3.87–5.11)
RDW: 13 % (ref 11.5–15.5)
WBC: 7.5 10*3/uL (ref 4.0–10.5)
nRBC: 0 % (ref 0.0–0.2)

## 2020-10-21 LAB — URINALYSIS, ROUTINE W REFLEX MICROSCOPIC
Bilirubin Urine: NEGATIVE
Glucose, UA: NEGATIVE mg/dL
Ketones, ur: 20 mg/dL — AB
Nitrite: NEGATIVE
Protein, ur: NEGATIVE mg/dL
Specific Gravity, Urine: 1.01 (ref 1.005–1.030)
pH: 5 (ref 5.0–8.0)

## 2020-10-21 LAB — I-STAT BETA HCG BLOOD, ED (MC, WL, AP ONLY): I-stat hCG, quantitative: <5 m[IU]/mL (ref <5)

## 2020-10-21 LAB — LIPASE, BLOOD: Lipase: 35 U/L (ref 11–51)

## 2020-10-21 MED ORDER — IOHEXOL 350 MG/ML SOLN
80.0000 mL | Freq: Once | INTRAVENOUS | Status: AC | PRN
  Administered 2020-10-21: 80 mL via INTRAVENOUS

## 2020-10-21 MED ORDER — LACTATED RINGERS IV BOLUS
1000.0000 mL | Freq: Once | INTRAVENOUS | Status: AC
  Administered 2020-10-22: 1000 mL via INTRAVENOUS

## 2020-10-21 MED ORDER — LACTATED RINGERS IV BOLUS
1000.0000 mL | Freq: Once | INTRAVENOUS | Status: AC
  Administered 2020-10-21: 1000 mL via INTRAVENOUS

## 2020-10-21 MED ORDER — HYDROMORPHONE HCL 1 MG/ML IJ SOLN
0.5000 mg | Freq: Once | INTRAMUSCULAR | Status: AC
  Administered 2020-10-22: 0.5 mg via INTRAVENOUS
  Filled 2020-10-21: qty 1

## 2020-10-21 MED ORDER — ONDANSETRON HCL 4 MG/2ML IJ SOLN
4.0000 mg | Freq: Once | INTRAMUSCULAR | Status: AC
  Administered 2020-10-21: 4 mg via INTRAVENOUS
  Filled 2020-10-21: qty 2

## 2020-10-21 MED ORDER — FAMOTIDINE IN NACL 20-0.9 MG/50ML-% IV SOLN
20.0000 mg | Freq: Once | INTRAVENOUS | Status: AC
  Administered 2020-10-22: 20 mg via INTRAVENOUS
  Filled 2020-10-21: qty 50

## 2020-10-21 NOTE — ED Triage Notes (Signed)
Pt states she has had abdominal pain and bloating for the past two days. Pt was seen yesterday for the same. Pt says she is still in pain and still is unable to eat or drink.

## 2020-10-21 NOTE — ED Notes (Signed)
Pt ambulatory in ED lobby. 

## 2020-10-21 NOTE — ED Provider Notes (Signed)
[facility]-EMERGENCY DEPT Provider Note   CSN: [id number] Arrival date & time: 10/21/20  7672     History Chief Complaint  Patient presents with   Abdominal Pain    Maureen Torres is a 35 y.o. female with a history of Roux-en-Y gastric bypass, pancreatitis, duodenal ulcer, who presents to the emergency department with a chief complaint of vomiting.  The patient reports that she has been more fatigued over the last week.  Approximately 4 days ago, she developed pain in her mid back accompanied by upper abdominal pain that began 2 days ago.  She is having epigastric abdominal pain that will radiate around her entire upper abdomen into her back.  She also notes right upper quadrant abdominal pain that intensifies it will cause pain in the left side of her back.  Two days ago, she had countless episodes of vomiting that was worse with attempting to eat or drink.  Vomiting has resolved, but she has had severe nausea that has been persistent and frequent retching anytime she tries to take sips of fluids or even drink broth.  She reports that the epigastric abdominal pain will radiate as burning pain up into the center of her chest accompanied by palpitations.  As she is feeling more constipated, bloated, and when pain intensifies she feels short of breath.  She reports decreased urine output and is only voided twice today.  No dysuria, hematuria, flank pain, vaginal bleeding or discharge, dizziness, lightheadedness, numbness, melena, hematochezia, or weakness.  Patient is a history of pancreatitis that was previously thought to be secondary to alcohol use versus biliary etiology.  She has not had a flare of pancreatitis since her cholecystectomy in September 2020.  She does report that she will drink wine approximately once per week on Fridays after she completes her work week at [employer] where she is lifting and moving boxes all week.  No recent increase or decrease in alcohol intake.    The history is provided by the patient and medical records. No language interpreter was used.      History reviewed. No pertinent past medical history.  Patient Active Problem List   Diagnosis Date Noted   Acute pancreatitis 10/22/2020    History reviewed. No pertinent surgical history.   OB History   No obstetric history on file.     No family history on file.  Social History   Tobacco Use   Smoking status: Former    Types: Cigarettes   Smokeless tobacco: Never    Home Medications Prior to Admission medications   Medication Sig Start Date End Date Taking? Authorizing Provider  acetaminophen (TYLENOL) 500 MG tablet Take 1,000 mg by mouth every 6 (six) hours as needed for mild pain.    [provider]  dicyclomine (BENTYL) 20 MG tablet Take 1 tablet (20 mg total) by mouth 2 (two) times daily. 10/20/20   Khatri, Hina, PA-C  famotidine (PEPCID) 20 MG tablet Take 1 tablet (20 mg total) by mouth 2 (two) times daily. 10/20/20   Khatri, Hina, PA-C  HYDROcodone-acetaminophen (NORCO/VICODIN) 5-325 MG tablet Take 1 tablet by mouth every 6 (six) hours as needed. 10/20/20   Khatri, Hina, PA-C  ibuprofen (ADVIL) 200 MG tablet Take 400 mg by mouth every 6 (six) hours as needed for mild pain.    [provider]  Multiple Vitamin (MULTIVITAMIN) tablet Take 1 tablet by mouth daily.    [provider]  ondansetron (ZOFRAN ODT) 4 MG disintegrating tablet Dissolve 1  tablet (4 mg total) by mouth every 8 (eight) hours as needed for nausea or vomiting. 10/20/20   Khatri, Hina, PA-C    Allergies    Aspirin, Ibuprofen, and Nsaids  Review of Systems   Review of Systems  Constitutional:  Positive for fatigue. Negative for activity change, appetite change, chills and fever.  HENT:  Negative for congestion and sore throat.   Respiratory:  Positive for shortness of breath.   Cardiovascular:  Positive for palpitations. Negative for chest pain and leg swelling.   Gastrointestinal:  Positive for abdominal pain, nausea and vomiting. Negative for anal bleeding, blood in stool and rectal pain.  Genitourinary:  Positive for decreased urine volume. Negative for dysuria, enuresis, flank pain, frequency, hematuria, urgency, vaginal bleeding, vaginal discharge and vaginal pain.  Musculoskeletal:  Negative for back pain, joint swelling, myalgias, neck pain and neck stiffness.  Skin:  Negative for rash.  Allergic/Immunologic: Negative for immunocompromised state.  Neurological:  Negative for dizziness, seizures, syncope, weakness, numbness and headaches.  Psychiatric/Behavioral:  Negative for confusion.    Physical Exam Updated Vital Signs BP 123/79   Pulse (!) 50   Temp 98.3 F (36.8 C) (Oral)   Resp 16   Ht 5' (1.524 m)   Wt 84.4 kg   SpO2 100%   BMI 36.33 kg/m   Physical Exam Vitals and nursing note reviewed.  Constitutional:      General: She is not in acute distress.    Appearance: She is not ill-appearing, toxic-appearing or diaphoretic.  HENT:     Head: Normocephalic.     Mouth/Throat:     Mouth: Mucous membranes are moist.     Pharynx: No oropharyngeal exudate or posterior oropharyngeal erythema.  Eyes:     Conjunctiva/sclera: Conjunctivae normal.  Cardiovascular:     Rate and Rhythm: Normal rate and regular rhythm.     Heart sounds: No murmur heard.   No friction rub. No gallop.  Pulmonary:     Effort: Pulmonary effort is normal. No respiratory distress.     Breath sounds: No stridor. No wheezing, rhonchi or rales.  Chest:     Chest wall: No tenderness.  Abdominal:     General: There is distension.     Palpations: Abdomen is soft. There is no mass.     Tenderness: There is abdominal tenderness. There is no right CVA tenderness, left CVA tenderness, guarding or rebound.     Hernia: No hernia is present.     Comments: Abdomen with mildly distended, but soft.  Hypoactive bowel sounds in all 4 quadrants.  She is diffusely tender to  palpation throughout the abdomen, but maximal tenderness is in the epigastric region and right upper quadrant.  Negative Murphy sign.  No CVA tenderness bilaterally.  Musculoskeletal:     Cervical back: Neck supple.     Right lower leg: No edema.     Left lower leg: No edema.  Skin:    General: Skin is warm.     Capillary Refill: Capillary refill takes 2 to 3 seconds.     Findings: No rash.  Neurological:     Mental Status: She is alert.  Psychiatric:        Behavior: Behavior normal.    ED Results / Procedures / Treatments   Labs (all labs ordered are listed, but only abnormal results are displayed) Labs Reviewed  COMPREHENSIVE METABOLIC PANEL - Abnormal; Notable for the following components:      Result Value   Sodium 133 (*)  CO2 19 (*)    Alkaline Phosphatase 144 (*)    Total Bilirubin 1.6 (*)    All other components within normal limits  URINALYSIS, ROUTINE W REFLEX MICROSCOPIC - Abnormal; Notable for the following components:   APPearance HAZY (*)    Hgb urine dipstick SMALL (*)    Ketones, ur 20 (*)    Leukocytes,Ua TRACE (*)    Bacteria, UA RARE (*)    All other components within normal limits  RESP PANEL BY RT-PCR (FLU A&B, COVID) ARPGX2  LIPASE, BLOOD  CBC  I-STAT BETA HCG BLOOD, ED ([facility], [facility], [facility] ONLY)  POC OCCULT BLOOD, ED    EKG EKG Interpretation  Date/Time:  Wednesday October 21 2020 23:35:25 EDT Ventricular Rate:  59 PR Interval:  148 QRS Duration: 90 QT Interval:  418 QTC Calculation: 415 R Axis:   18 Text Interpretation: Sinus arrhythmia No old tracing to compare Confirmed by Mancel Bale [phone]) on 10/21/2020 11:46:13 PM  Radiology CT Abdomen Pelvis W Contrast  Result Date: 10/21/2020 CLINICAL DATA:  Abdominal pain/bloating x2 days EXAM: CT ABDOMEN AND PELVIS WITH CONTRAST TECHNIQUE: Multidetector CT imaging of the abdomen and pelvis was performed using the standard protocol following bolus administration of intravenous contrast. CONTRAST:   11mL OMNIPAQUE IOHEXOL 350 MG/ML SOLN COMPARISON:  None. FINDINGS: Lower chest: Faint ground-glass opacity/mosaic attenuation at the lung bases, possibly reflecting sequela of prior atypical infection. Hepatobiliary: Liver is within normal limits. Status post cholecystectomy. No intrahepatic or extrahepatic ductal dilatation. Pancreas: Inflammatory changes along the uncinate process (series 2/image 29), suggesting mild acute pancreatitis. No peripancreatic fluid collection/walled-off necrosis. Spleen: Within normal limits. Adrenals/Urinary Tract: Adrenal glands are within normal limits. Kidneys are within normal limits.  No hydronephrosis. Bladder is within normal limits. 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 No evidence of bowel obstruction. Normal appendix (series 2/image 52). Vascular/Lymphatic: No evidence of abdominal aortic aneurysm. No suspicious abdominopelvic lymphadenopathy. Reproductive: Anterior position of the uterus, suggesting prior C-section. Bilateral ovaries are within normal limits, noting a right corpus luteum. Other: No abdominopelvic ascites. Musculoskeletal: Visualized osseous structures are within normal limits. IMPRESSION: Suspected mild acute pancreatitis.  No associated complication. Electronically Signed   By: Charline Bills M.D.   On: 10/21/2020 23:28   US Abdomen Limited RUQ (LIVER/GB)  Result Date: 10/20/2020 CLINICAL DATA:  Right upper quadrant pain EXAM: ULTRASOUND ABDOMEN LIMITED RIGHT UPPER QUADRANT COMPARISON:  None. FINDINGS: Gallbladder: Cholecystectomy. Common bile duct: Diameter: 7 mm, within normal limits Liver: No focal lesion identified. Within normal limits in parenchymal echogenicity. Portal vein is patent on color Doppler imaging with normal direction of blood flow towards the liver. Other: None. IMPRESSION: Unremarkable right upper quadrant ultrasound.  Post cholecystectomy. Electronically Signed   By: Guadlupe Spanish M.D.   On: 10/20/2020  14:14    Procedures Procedures   Medications Ordered in ED Medications  HYDROmorphone (DILAUDID) injection 0.5 mg (0.5 mg Intravenous Given 10/22/20 0231)  lactated ringers infusion ( Intravenous New Bag/Given 10/22/20 0231)  iohexol (OMNIPAQUE) 350 MG/ML injection 80 mL (80 mLs Intravenous Contrast Given 10/21/20 2315)  ondansetron (ZOFRAN) injection 4 mg (4 mg Intravenous Given 10/21/20 2339)  lactated ringers bolus 1,000 mL (0 mLs Intravenous Stopped 10/22/20 0222)  HYDROmorphone (DILAUDID) injection 0.5 mg (0.5 mg Intravenous Given 10/22/20 0011)  lactated ringers bolus 1,000 mL (0 mLs Intravenous Stopped 10/22/20 0222)  famotidine (PEPCID) IVPB 20 mg premix (0 mg Intravenous Stopped 10/22/20 0055)  pantoprazole (PROTONIX) injection 40 mg (40 mg Intravenous Given 10/22/20 0143)  ED Course  I have reviewed the triage vital signs and the nursing notes.  Pertinent labs & imaging results that were available during my care of the patient were reviewed by me and considered in my medical decision making (see chart for details).    MDM Rules/Calculators/A&P                           35 year old female with a history of Roux-en-Y gastric bypass, pancreatitis, duodenal ulcer who presents to the emergency department with upper abdominal pain, mid back pain, fatigue, nausea, vomiting, retching, bloating.  Vital signs are stable.  Afebrile.  Labs and imaging of been reviewed and independently interpreted by me.  Work-up is suspicious for acute, early uncomplicated pancreatitis based on CT abdomen pelvis.  Lipase is minimally improved from previous.  Her bicarb is 19, likely secondary to vomiting and poor p.o. intake since onset of her symptoms.  No significant metabolic derangements.  Patient does continue to have mild alcohol use.  Did consider that this may be secondary to a retained stone from previous cholecystectomy since care everywhere does suggest that there was concern for alcoholic  pancreatitis versus pancreatitis secondary to biliary cause.  I reviewed the right upper quadrant ultrasound obtained in the ER yesterday, which was unremarkable.  Did also consider bleeding or ruptured peptic ulcer disease on the differential given that she is endorsing burning epigastric pain and fatigue over the last week.  Hemoccult was negative.  Hemoglobin is stable from previous.  Patient has been given LR bolus x2, famotidine, pantoprazole, Dilaudid for pain control, and Zofran.  However, she has continued to have retching and is unable to tolerate p.o. fluids in the ED.  She will require admission for further work-up and evaluation for suspected early pancreatitis.  Consult to the hospitalist team and Dr. Toniann FailKakrakandy will accept the patient for admission.  Final Clinical Impression(s) / ED Diagnoses Final diagnoses:  Acute pancreatitis, unspecified complication status, unspecified pancreatitis type  Intractable vomiting with nausea, unspecified vomiting type    Rx / DC Orders ED Discharge Orders     None        Barkley BoardsMcDonald,  A, PA-C 10/22/20 0236    Zadie RhineWickline, Donald, MD 10/23/20 [phone]

## 2020-10-21 NOTE — ED Provider Notes (Signed)
Emergency Medicine Provider Triage Evaluation Note  Maureen Torres , a 35 y.o. female  was evaluated in triage.  Pt complains of epigastric pain x3 days.  Pain is constant, mostly in the epigastric area but distributed in a bandlike distribution that radiates also to the back.  There is associated nausea and vomiting.  She is status postcholecystectomy, C-section, gastric bypass surgery.  Has tried Bentyl, oxycodone, Zofran with minimal relief.  She was seen 2 days ago and imaging was declined at that time.  Review of Systems  Positive: Abdominal pain, nausea, vomiting Negative: Shortness of breath, fever  Physical Exam  BP (!) 129/94 (BP Location: Left Arm)   Pulse 66   Temp 98.3 F (36.8 C) (Oral)   Resp 16   Ht 5' (1.524 m)   Wt 84.4 kg   SpO2 100%   BMI 36.33 kg/m  Gen:   Awake, no distress   Resp:  Normal effort  MSK:   Moves extremities without difficulty  Other:  Epigastric pain with guarding.  Abdomen is somewhat distended but soft to touch.  Medical Decision Making  Medically screening exam initiated at 7:47 PM.  Appropriate orders placed.  Kihanna Kamiya was informed that the remainder of the evaluation will be completed by another provider, this initial triage assessment does not replace that evaluation, and the importance of remaining in the ED until their evaluation is complete.     Theron Arista, PA-C 10/21/20 1948    Mancel Bale, MD 10/22/20 0001

## 2020-10-21 NOTE — ED Notes (Signed)
Back from CT

## 2020-10-22 ENCOUNTER — Encounter (HOSPITAL_COMMUNITY): Payer: Self-pay | Admitting: Internal Medicine

## 2020-10-22 DIAGNOSIS — Z886 Allergy status to analgesic agent status: Secondary | ICD-10-CM | POA: Diagnosis not present

## 2020-10-22 DIAGNOSIS — R002 Palpitations: Secondary | ICD-10-CM | POA: Diagnosis present

## 2020-10-22 DIAGNOSIS — Z87891 Personal history of nicotine dependence: Secondary | ICD-10-CM | POA: Diagnosis not present

## 2020-10-22 DIAGNOSIS — R112 Nausea with vomiting, unspecified: Secondary | ICD-10-CM

## 2020-10-22 DIAGNOSIS — K852 Alcohol induced acute pancreatitis without necrosis or infection: Principal | ICD-10-CM

## 2020-10-22 DIAGNOSIS — K859 Acute pancreatitis without necrosis or infection, unspecified: Secondary | ICD-10-CM | POA: Diagnosis present

## 2020-10-22 DIAGNOSIS — Z20822 Contact with and (suspected) exposure to covid-19: Secondary | ICD-10-CM | POA: Diagnosis present

## 2020-10-22 DIAGNOSIS — Z9884 Bariatric surgery status: Secondary | ICD-10-CM | POA: Diagnosis not present

## 2020-10-22 DIAGNOSIS — Z8711 Personal history of peptic ulcer disease: Secondary | ICD-10-CM | POA: Diagnosis not present

## 2020-10-22 DIAGNOSIS — Z9049 Acquired absence of other specified parts of digestive tract: Secondary | ICD-10-CM | POA: Diagnosis not present

## 2020-10-22 LAB — BASIC METABOLIC PANEL
Anion gap: 8 (ref 5–15)
BUN: 7 mg/dL (ref 6–20)
CO2: 25 mmol/L (ref 22–32)
Calcium: 8.8 mg/dL — ABNORMAL LOW (ref 8.9–10.3)
Chloride: 104 mmol/L (ref 98–111)
Creatinine, Ser: 0.55 mg/dL (ref 0.44–1.00)
GFR, Estimated: >60 mL/min (ref >60)
Glucose, Bld: 95 mg/dL (ref 70–99)
Potassium: 3.7 mmol/L (ref 3.5–5.1)
Sodium: 137 mmol/L (ref 135–145)

## 2020-10-22 LAB — CBC WITH DIFFERENTIAL/PLATELET
Abs Immature Granulocytes: 0.02 10*3/uL (ref 0.00–0.07)
Basophils Absolute: 0 10*3/uL (ref 0.0–0.1)
Basophils Relative: 0 %
Eosinophils Absolute: 0.1 10*3/uL (ref 0.0–0.5)
Eosinophils Relative: 1 %
HCT: 36.3 % (ref 36.0–46.0)
Hemoglobin: 12.1 g/dL (ref 12.0–15.0)
Immature Granulocytes: 0 %
Lymphocytes Relative: 29 %
Lymphs Abs: 1.7 10*3/uL (ref 0.7–4.0)
MCH: 32.2 pg (ref 26.0–34.0)
MCHC: 33.3 g/dL (ref 30.0–36.0)
MCV: 96.5 fL (ref 80.0–100.0)
Monocytes Absolute: 0.4 10*3/uL (ref 0.1–1.0)
Monocytes Relative: 6 %
Neutro Abs: 3.7 10*3/uL (ref 1.7–7.7)
Neutrophils Relative %: 64 %
Platelets: 229 10*3/uL (ref 150–400)
RBC: 3.76 MIL/uL — ABNORMAL LOW (ref 3.87–5.11)
RDW: 13.2 % (ref 11.5–15.5)
WBC: 5.9 10*3/uL (ref 4.0–10.5)
nRBC: 0 % (ref 0.0–0.2)

## 2020-10-22 LAB — HEPATIC FUNCTION PANEL
ALT: 17 U/L (ref 0–44)
AST: 43 U/L — ABNORMAL HIGH (ref 15–41)
Albumin: 3.4 g/dL — ABNORMAL LOW (ref 3.5–5.0)
Alkaline Phosphatase: 145 U/L — ABNORMAL HIGH (ref 38–126)
Bilirubin, Direct: 0.4 mg/dL — ABNORMAL HIGH (ref 0.0–0.2)
Indirect Bilirubin: 0.8 mg/dL (ref 0.3–0.9)
Total Bilirubin: 1.2 mg/dL (ref 0.3–1.2)
Total Protein: 6.4 g/dL — ABNORMAL LOW (ref 6.5–8.1)

## 2020-10-22 LAB — RESP PANEL BY RT-PCR (FLU A&B, COVID) ARPGX2
Influenza A by PCR: NEGATIVE
Influenza B by PCR: NEGATIVE
SARS Coronavirus 2 by RT PCR: NEGATIVE

## 2020-10-22 LAB — HIV ANTIBODY (ROUTINE TESTING W REFLEX): HIV Screen 4th Generation wRfx: NONREACTIVE

## 2020-10-22 LAB — CBG MONITORING, ED: Glucose-Capillary: 89 mg/dL (ref 70–99)

## 2020-10-22 LAB — GLUCOSE, CAPILLARY: Glucose-Capillary: 117 mg/dL — ABNORMAL HIGH (ref 70–99)

## 2020-10-22 LAB — TROPONIN I (HIGH SENSITIVITY): Troponin I (High Sensitivity): 4 ng/L (ref <18)

## 2020-10-22 LAB — TRIGLYCERIDES: Triglycerides: 120 mg/dL (ref <150)

## 2020-10-22 LAB — POC OCCULT BLOOD, ED: Fecal Occult Bld: NEGATIVE

## 2020-10-22 MED ORDER — THIAMINE HCL 100 MG/ML IJ SOLN
100.0000 mg | Freq: Every day | INTRAMUSCULAR | Status: DC
  Administered 2020-10-22 – 2020-10-25 (×4): 100 mg via INTRAVENOUS
  Filled 2020-10-22 (×4): qty 2

## 2020-10-22 MED ORDER — HYDROMORPHONE HCL 1 MG/ML IJ SOLN
0.5000 mg | INTRAMUSCULAR | Status: DC | PRN
  Administered 2020-10-22 – 2020-10-23 (×9): 0.5 mg via INTRAVENOUS
  Filled 2020-10-22: qty 0.5
  Filled 2020-10-22: qty 1
  Filled 2020-10-22 (×3): qty 0.5
  Filled 2020-10-22 (×2): qty 1
  Filled 2020-10-22 (×2): qty 0.5

## 2020-10-22 MED ORDER — ONDANSETRON HCL 4 MG/2ML IJ SOLN
4.0000 mg | Freq: Four times a day (QID) | INTRAMUSCULAR | Status: DC | PRN
  Administered 2020-10-22 – 2020-10-24 (×5): 4 mg via INTRAVENOUS
  Filled 2020-10-22 (×5): qty 2

## 2020-10-22 MED ORDER — ENOXAPARIN SODIUM 40 MG/0.4ML IJ SOSY
40.0000 mg | PREFILLED_SYRINGE | INTRAMUSCULAR | Status: DC
  Administered 2020-10-22 – 2020-10-25 (×4): 40 mg via SUBCUTANEOUS
  Filled 2020-10-22 (×4): qty 0.4

## 2020-10-22 MED ORDER — LACTATED RINGERS IV SOLN: INTRAVENOUS | Status: AC

## 2020-10-22 MED ORDER — PANTOPRAZOLE SODIUM 40 MG IV SOLR
40.0000 mg | Freq: Once | INTRAVENOUS | Status: AC
  Administered 2020-10-22: 40 mg via INTRAVENOUS
  Filled 2020-10-22: qty 40

## 2020-10-22 MED ORDER — PANTOPRAZOLE SODIUM 40 MG IV SOLR
40.0000 mg | INTRAVENOUS | Status: DC
  Administered 2020-10-22: 40 mg via INTRAVENOUS
  Filled 2020-10-22: qty 40

## 2020-10-22 NOTE — ED Notes (Signed)
Patient walked to the bathroom

## 2020-10-22 NOTE — H&P (Signed)
History and Physical    Maureen Torres IHK:[id number] DOB: October 02, 1985 DOA: 10/21/2020  PCP: Pcp, No  Patient coming from: Home.  Chief Complaint: Abdominal pain.  HPI: Maureen Torres is a 35 y.o. female with history of Roux-en-Y gastric bypass, history of alcoholic pancreatitis, history of cholecystectomy presents to the ER for the second time in the last 24 hours with complaint of abdominal pain pain is mostly in the epigastric area radiating to the back sometimes with chest with nausea times vomiting denies any diarrhea.  Did have some oily stools.  His pain has been persistent and increasing in intensity.  Patient had come to the ER 24 hours ago had sonogram of the abdomen which was unremarkable.  Lipase is mildly elevated.  Patient presents back because of worsening pain.  Patient states she did drink some wine 4 days ago.  ED Course: In the ER patient had a CT abdomen pelvis which shows features concerning for acute pancreatitis.  EKG was unremarkable labs show alkaline phos of 144 LFTs are normal except for a total bilirubin of 1.6.  EKG unremarkable COVID test negative.  Patient was given fluid bolus and admitted for acute pancreatitis.  Review of Systems: As per HPI, rest all negative.   History reviewed. No pertinent past medical history.  Past Surgical History:  Procedure Laterality Date   CHOLECYSTECTOMY     rous and y       reports that she has quit smoking. Her smoking use included cigarettes. She has never used smokeless tobacco. She reports current alcohol use of about 4.0 standard drinks per week. She reports that she does not use drugs.  Allergies  Allergen Reactions   Aspirin Other (See Comments)    Stomach ulcer   Ibuprofen Other (See Comments)    Gastric Bypass--advised to avoid   Nsaids Other (See Comments)    Gastric bypass Hx gastric bypass     Family History  Problem Relation Age of Onset   Pancreatitis Neg Hx     Prior to Admission medications    Medication Sig Start Date End Date Taking? Authorizing Provider  acetaminophen (TYLENOL) 500 MG tablet Take 1,000 mg by mouth every 6 (six) hours as needed for mild pain.    [provider]  dicyclomine (BENTYL) 20 MG tablet Take 1 tablet (20 mg total) by mouth 2 (two) times daily. 10/20/20   Khatri, Hina, PA-C  famotidine (PEPCID) 20 MG tablet Take 1 tablet (20 mg total) by mouth 2 (two) times daily. 10/20/20   Khatri, Hina, PA-C  HYDROcodone-acetaminophen (NORCO/VICODIN) 5-325 MG tablet Take 1 tablet by mouth every 6 (six) hours as needed. 10/20/20   Khatri, Hina, PA-C  ibuprofen (ADVIL) 200 MG tablet Take 400 mg by mouth every 6 (six) hours as needed for mild pain.    [provider]  Multiple Vitamin (MULTIVITAMIN) tablet Take 1 tablet by mouth daily.    [provider]  ondansetron (ZOFRAN ODT) 4 MG disintegrating tablet Dissolve 1 tablet (4 mg total) by mouth every 8 (eight) hours as needed for nausea or vomiting. 10/20/20   Dietrich Pates, PA-C    Physical Exam: Constitutional: Moderately built and nourished. Vitals:   10/21/20 2345 10/22/20 0015 10/22/20 0030 10/22/20 0200  BP: 130/86 130/84 136/87 123/79  Pulse: (!) 53 (!) 58 60 (!) 50  Resp: 14 (!) 9 16 16   Temp:      TempSrc:      SpO2: 100% 100% 100% 100%  Weight:  Height:       Eyes: Anicteric no pallor. ENMT: No discharge from the ears eyes nose and mouth. Neck: No mass felt.  No neck rigidity. Respiratory: No rhonchi or crepitations. Cardiovascular: S1-S2 heard. Abdomen: Epigastric tenderness no guarding or rigidity. Musculoskeletal: No edema. Skin: No rash. Neurologic: Alert awake oriented to time place and person.  Moves all extremities. Psychiatric: Appears normal.  Normal affect.   Labs on Admission: I have personally reviewed following labs and imaging studies  CBC: Recent Labs  Lab 10/20/20 1309 10/21/20 1951  WBC 7.3 7.5  HGB 13.0 13.1  HCT 38.4 39.1  MCV 95.0 96.3  PLT 266  250   Basic Metabolic Panel: Recent Labs  Lab 10/20/20 1309 10/21/20 1951  NA 138 133*  K 3.8 3.9  CL 107 103  CO2 22 19*  GLUCOSE 95 88  BUN 11 7  CREATININE 0.71 0.60  CALCIUM 9.1 9.1   GFR: Estimated Creatinine Clearance: 93.8 mL/min (by C-G formula based on SCr of 0.6 mg/dL). Liver Function Tests: Recent Labs  Lab 10/20/20 1309 10/21/20 1951  AST 20 17  ALT 17 14  ALKPHOS 140* 144*  BILITOT 1.0 1.6*  PROT 7.5 7.3  ALBUMIN 4.1 3.9   Recent Labs  Lab 10/20/20 1309 10/21/20 1951  LIPASE 52* 35   No results for input(s): AMMONIA in the last 168 hours. Coagulation Profile: No results for input(s): INR, PROTIME in the last 168 hours. Cardiac Enzymes: No results for input(s): CKTOTAL, CKMB, CKMBINDEX, TROPONINI in the last 168 hours. BNP (last 3 results) No results for input(s): PROBNP in the last 8760 hours. HbA1C: No results for input(s): HGBA1C in the last 72 hours. CBG: No results for input(s): GLUCAP in the last 168 hours. Lipid Profile: No results for input(s): CHOL, HDL, LDLCALC, TRIG, CHOLHDL, LDLDIRECT in the last 72 hours. Thyroid Function Tests: No results for input(s): TSH, T4TOTAL, FREET4, T3FREE, THYROIDAB in the last 72 hours. Anemia Panel: No results for input(s): VITAMINB12, FOLATE, FERRITIN, TIBC, IRON, RETICCTPCT in the last 72 hours. Urine analysis:    Component Value Date/Time   COLORURINE YELLOW 10/21/2020 1939   APPEARANCEUR HAZY (A) 10/21/2020 1939   LABSPEC 1.010 10/21/2020 1939   PHURINE 5.0 10/21/2020 1939   GLUCOSEU NEGATIVE 10/21/2020 1939   HGBUR SMALL (A) 10/21/2020 1939   BILIRUBINUR NEGATIVE 10/21/2020 1939   KETONESUR 20 (A) 10/21/2020 1939   PROTEINUR NEGATIVE 10/21/2020 1939   NITRITE NEGATIVE 10/21/2020 1939   LEUKOCYTESUR TRACE (A) 10/21/2020 1939   Sepsis Labs: @LABRCNTIP (procalcitonin:4,lacticidven:4) ) Recent Results (from the past 240 hour(s))  Resp Panel by RT-PCR (Flu A&B, Covid) Nasopharyngeal Swab      Status: None   Collection Time: 10/22/20  2:34 AM   Specimen: Nasopharyngeal Swab; Nasopharyngeal(NP) swabs in vial transport medium  Result Value Ref Range Status   SARS Coronavirus 2 by RT PCR NEGATIVE NEGATIVE Final    Comment: (NOTE) SARS-CoV-2 target nucleic acids are NOT DETECTED.  The SARS-CoV-2 RNA is generally detectable in upper respiratory specimens during the acute phase of infection. The lowest concentration of SARS-CoV-2 viral copies this assay can detect is 138 copies/mL. A negative result does not preclude SARS-Cov-2 infection and should not be used as the sole basis for treatment or other patient management decisions. A negative result may occur with  improper specimen collection/handling, submission of specimen other than nasopharyngeal swab, presence of viral mutation(s) within the areas targeted by this assay, and inadequate number of viral copies(<138 copies/mL). A negative  result must be combined with clinical observations, patient history, and epidemiological information. The expected result is Negative.  Fact Sheet for Patients:  BloggerCourse.com  Fact Sheet for Healthcare Providers:  SeriousBroker.it  This test is no t yet approved or cleared by the [place] FDA and  has been authorized for detection and/or diagnosis of SARS-CoV-2 by FDA under an Emergency Use Authorization (EUA). This EUA will remain  in effect (meaning this test can be used) for the duration of the COVID-19 declaration under Section 564(b)(1) of the Act, 21 U.S.C.section 360bbb-3(b)(1), unless the authorization is terminated  or revoked sooner.       Influenza A by PCR NEGATIVE NEGATIVE Final   Influenza B by PCR NEGATIVE NEGATIVE Final    Comment: (NOTE) The Xpert Xpress SARS-CoV-2/FLU/RSV plus assay is intended as an aid in the diagnosis of influenza from Nasopharyngeal swab specimens and should not be used as a sole basis  for treatment. Nasal washings and aspirates are unacceptable for Xpert Xpress SARS-CoV-2/FLU/RSV testing.  Fact Sheet for Patients: BloggerCourse.com  Fact Sheet for Healthcare Providers: SeriousBroker.it  This test is not yet approved or cleared by the [place] FDA and has been authorized for detection and/or diagnosis of SARS-CoV-2 by FDA under an Emergency Use Authorization (EUA). This EUA will remain in effect (meaning this test can be used) for the duration of the COVID-19 declaration under Section 564(b)(1) of the Act, 21 U.S.C. section 360bbb-3(b)(1), unless the authorization is terminated or revoked.  Performed at [facility], [address] [place], [place] [address]      Radiological Exams on Admission: CT Abdomen Pelvis W Contrast  Result Date: 10/21/2020 CLINICAL DATA:  Abdominal pain/bloating x2 days EXAM: CT ABDOMEN AND PELVIS WITH CONTRAST TECHNIQUE: Multidetector CT imaging of the abdomen and pelvis was performed using the standard protocol following bolus administration of intravenous contrast. CONTRAST:  38mL OMNIPAQUE IOHEXOL 350 MG/ML SOLN COMPARISON:  None. FINDINGS: Lower chest: Faint ground-glass opacity/mosaic attenuation at the lung bases, possibly reflecting sequela of prior atypical infection. Hepatobiliary: Liver is within normal limits. Status post cholecystectomy. No intrahepatic or extrahepatic ductal dilatation. Pancreas: Inflammatory changes along the uncinate process (series 2/image 29), suggesting mild acute pancreatitis. No peripancreatic fluid collection/walled-off necrosis. Spleen: Within normal limits. Adrenals/Urinary Tract: Adrenal glands are within normal limits. Kidneys are within normal limits.  No hydronephrosis. Bladder is within normal limits. 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 No evidence of bowel obstruction. Normal appendix (series 2/image  52). Vascular/Lymphatic: No evidence of abdominal aortic aneurysm. No suspicious abdominopelvic lymphadenopathy. Reproductive: Anterior position of the uterus, suggesting prior C-section. Bilateral ovaries are within normal limits, noting a right corpus luteum. Other: No abdominopelvic ascites. Musculoskeletal: Visualized osseous structures are within normal limits. IMPRESSION: Suspected mild acute pancreatitis.  No associated complication. Electronically Signed   By: Charline Bills M.D.   On: 10/21/2020 23:28   US Abdomen Limited RUQ (LIVER/GB)  Result Date: 10/20/2020 CLINICAL DATA:  Right upper quadrant pain EXAM: ULTRASOUND ABDOMEN LIMITED RIGHT UPPER QUADRANT COMPARISON:  None. FINDINGS: Gallbladder: Cholecystectomy. Common bile duct: Diameter: 7 mm, within normal limits Liver: No focal lesion identified. Within normal limits in parenchymal echogenicity. Portal vein is patent on color Doppler imaging with normal direction of blood flow towards the liver. Other: None. IMPRESSION: Unremarkable right upper quadrant ultrasound.  Post cholecystectomy. Electronically Signed   By: Guadlupe Spanish M.D.   On: 10/20/2020 14:14    EKG: Independently reviewed.  Normal sinus rhythm.  Assessment/Plan Principal  Problem:   Acute pancreatitis    Acute pancreatitis likely could be from alcohol related.  Patient did admit to drinking some wine.  We will keep patient NPO.  Check triglyceride levels.  If pain persist may consider MRCP.  Aggressive IV hydration.  Pain relief medications. History of peptic ulcer disease with perforation previously.  Keep patient on PPI. Patient does drink alcohol usually wine.  We will closely monitor.  Keep patient on thiamine.  Advised about quitting. Prior history of Roux-en-Y gastric bypass surgery.  Since patient has acute pancreatitis will need close monitoring for any further worsening in inpatient status.   DVT prophylaxis: Lovenox. Code Status: Full code. Family  Communication: Discussed with patient. Disposition Plan: Home. Consults called: None. Admission status: Inpatient.   Eduard Clos MD [facility] Pager [phone].  If 7PM-7AM, please contact night-coverage [email or web address] Password [facility]  10/22/2020, 3:25 AM

## 2020-10-22 NOTE — ED Notes (Signed)
Patient ambulated to the bathroom.

## 2020-10-22 NOTE — Progress Notes (Signed)
[facility] PROGRESS NOTE    Progress Note  Maureen Torres  WNU:[id number] DOB: [birth year] DOA: 10/21/2020 PCP: Pcp, No     Brief Narrative:   Maureen Torres is an 35 y.o. female past medical history of bypass Roux-en-Y gastric surgery, alcoholic pancreatitis history of cholecystectomy presents to the ER for the second time in the last 24 hours for abdominal pain mostly in the epigastric area with some nausea and vomiting denies any diarrhea.  In the ED abdominal ultrasound was unremarkable, lipase mildly elevated patient relates drinking wine about 4 days prior to admission CT scan of the abdomen pelvis showed acute pancreatitis   Assessment/Plan:   Acute alcoholic pancreatitis: Place her n.p.o. continue IV fluids and narcotics for pain. Will need to monitor electrolytes and  her bowel movement. Patient continues to have abdominal pain keep her n.p.o.    DVT prophylaxis: lovenox Family Communication:none Status is: Inpatient  Remains inpatient appropriate because:Hemodynamically unstable  Dispo: The patient is from: Home              Anticipated d/c is to: Home              Patient currently is not medically stable to d/c.   Difficult to place patient No    Code Status:     Code Status Orders  (From admission, onward)           Start     Ordered   10/22/20 0324  Full code  Continuous        10/22/20 0325           Code Status History     This patient has a current code status but no historical code status.         IV Access:   Peripheral IV   Procedures and diagnostic studies:   CT Abdomen Pelvis W Contrast  Result Date: 10/21/2020 CLINICAL DATA:  Abdominal pain/bloating x2 days EXAM: CT ABDOMEN AND PELVIS WITH CONTRAST TECHNIQUE: Multidetector CT imaging of the abdomen and pelvis was performed using the standard protocol following bolus administration of intravenous contrast. CONTRAST:  75mL OMNIPAQUE IOHEXOL 350 MG/ML SOLN COMPARISON:   None. FINDINGS: Lower chest: Faint ground-glass opacity/mosaic attenuation at the lung bases, possibly reflecting sequela of prior atypical infection. Hepatobiliary: Liver is within normal limits. Status post cholecystectomy. No intrahepatic or extrahepatic ductal dilatation. Pancreas: Inflammatory changes along the uncinate process (series 2/image 29), suggesting mild acute pancreatitis. No peripancreatic fluid collection/walled-off necrosis. Spleen: Within normal limits. Adrenals/Urinary Tract: Adrenal glands are within normal limits. Kidneys are within normal limits.  No hydronephrosis. Bladder is within normal limits. 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 No evidence of bowel obstruction. Normal appendix (series 2/image 52). Vascular/Lymphatic: No evidence of abdominal aortic aneurysm. No suspicious abdominopelvic lymphadenopathy. Reproductive: Anterior position of the uterus, suggesting prior C-section. Bilateral ovaries are within normal limits, noting a right corpus luteum. Other: No abdominopelvic ascites. Musculoskeletal: Visualized osseous structures are within normal limits. IMPRESSION: Suspected mild acute pancreatitis.  No associated complication. Electronically Signed   By: Charline Bills M.D.   On: 10/21/2020 23:28   US Abdomen Limited RUQ (LIVER/GB)  Result Date: 10/20/2020 CLINICAL DATA:  Right upper quadrant pain EXAM: ULTRASOUND ABDOMEN LIMITED RIGHT UPPER QUADRANT COMPARISON:  None. FINDINGS: Gallbladder: Cholecystectomy. Common bile duct: Diameter: 7 mm, within normal limits Liver: No focal lesion identified. Within normal limits in parenchymal echogenicity. Portal vein is patent on color Doppler imaging with normal direction of blood flow towards the liver.  Other: None. IMPRESSION: Unremarkable right upper quadrant ultrasound.  Post cholecystectomy. Electronically Signed   By: Guadlupe Spanish M.D.   On: 10/20/2020 14:14     Medical Consultants:    None.   Subjective:    Maureen Torres relates she still has pain that radiates to her back.  Objective:    Vitals:   10/22/20 0200 10/22/20 0400 10/22/20 0600 10/22/20 0700  BP: 123/79 112/84 122/82 (!) 119/91  Pulse: (!) 50 (!) 51 (!) 50 75  Resp: 16 13 13  (!) 21  Temp:      TempSrc:      SpO2: 100% 99% 100% 100%  Weight:      Height:       SpO2: 100 %   Intake/Output Summary (Last 24 hours) at 10/22/2020 0939 Last data filed at 10/22/2020 0222 Gross per 24 hour  Intake 2050 ml  Output --  Net 2050 ml   Filed Weights   10/21/20 1917  Weight: 84.4 kg    Exam: General exam: In no acute distress. Respiratory system: Good air movement and clear to auscultation. Cardiovascular system: S1 & S2 heard, RRR. No JVD. Gastrointestinal system: Positive bowel sounds soft, with epigastric pain no rebound or guarding Extremities: No pedal edema. Skin: No rashes, lesions or ulcers Psychiatry: Judgement and insight appear normal. Mood & affect appropriate.    Data Reviewed:    Labs: Basic Metabolic Panel: Recent Labs  Lab 10/20/20 1309 10/21/20 1951 10/22/20 0433  NA 138 133* 137  K 3.8 3.9 3.7  CL 107 103 104  CO2 22 19* 25  GLUCOSE 95 88 95  BUN 11 7 7   CREATININE 0.71 0.60 0.55  CALCIUM 9.1 9.1 8.8*   GFR Estimated Creatinine Clearance: 93.8 mL/min (by C-G formula based on SCr of 0.55 mg/dL). Liver Function Tests: Recent Labs  Lab 10/20/20 1309 10/21/20 1951 10/22/20 0433  AST 20 17 43*  ALT 17 14 17   ALKPHOS 140* 144* 145*  BILITOT 1.0 1.6* 1.2  PROT 7.5 7.3 6.4*  ALBUMIN 4.1 3.9 3.4*   Recent Labs  Lab 10/20/20 1309 10/21/20 1951  LIPASE 52* 35   No results for input(s): AMMONIA in the last 168 hours. Coagulation profile No results for input(s): INR, PROTIME in the last 168 hours. COVID-19 Labs  No results for input(s): DDIMER, FERRITIN, LDH, CRP in the last 72 hours.  Lab Results  Component Value Date   SARSCOV2NAA NEGATIVE  10/22/2020    CBC: Recent Labs  Lab 10/20/20 1309 10/21/20 1951 10/22/20 0433  WBC 7.3 7.5 5.9  NEUTROABS  --   --  3.7  HGB 13.0 13.1 12.1  HCT 38.4 39.1 36.3  MCV 95.0 96.3 96.5  PLT 266 250 229   Cardiac Enzymes: No results for input(s): CKTOTAL, CKMB, CKMBINDEX, TROPONINI in the last 168 hours. BNP (last 3 results) No results for input(s): PROBNP in the last 8760 hours. CBG: Recent Labs  Lab 10/22/20 0929  GLUCAP 89   D-Dimer: No results for input(s): DDIMER in the last 72 hours. Hgb A1c: No results for input(s): HGBA1C in the last 72 hours. Lipid Profile: Recent Labs    10/22/20 0433  TRIG 120   Thyroid function studies: No results for input(s): TSH, T4TOTAL, T3FREE, THYROIDAB in the last 72 hours.  Invalid input(s): FREET3 Anemia work up: No results for input(s): VITAMINB12, FOLATE, FERRITIN, TIBC, IRON, RETICCTPCT in the last 72 hours. Sepsis Labs: Recent Labs  Lab 10/20/20 1309 10/21/20 1951 10/22/20 12/20/20  WBC 7.3 7.5 5.9   Microbiology Recent Results (from the past 240 hour(s))  Resp Panel by RT-PCR (Flu A&B, Covid) Nasopharyngeal Swab     Status: None   Collection Time: 10/22/20  2:34 AM   Specimen: Nasopharyngeal Swab; Nasopharyngeal(NP) swabs in vial transport medium  Result Value Ref Range Status   SARS Coronavirus 2 by RT PCR NEGATIVE NEGATIVE Final    Comment: (NOTE) SARS-CoV-2 target nucleic acids are NOT DETECTED.  The SARS-CoV-2 RNA is generally detectable in upper respiratory specimens during the acute phase of infection. The lowest concentration of SARS-CoV-2 viral copies this assay can detect is 138 copies/mL. A negative result does not preclude SARS-Cov-2 infection and should not be used as the sole basis for treatment or other patient management decisions. A negative result may occur with  improper specimen collection/handling, submission of specimen other than nasopharyngeal swab, presence of viral mutation(s) within  the areas targeted by this assay, and inadequate number of viral copies(<138 copies/mL). A negative result must be combined with clinical observations, patient history, and epidemiological information. The expected result is Negative.  Fact Sheet for Patients:  BloggerCourse.com  Fact Sheet for Healthcare Providers:  SeriousBroker.it  This test is no t yet approved or cleared by the [place] FDA and  has been authorized for detection and/or diagnosis of SARS-CoV-2 by FDA under an Emergency Use Authorization (EUA). This EUA will remain  in effect (meaning this test can be used) for the duration of the COVID-19 declaration under Section 564(b)(1) of the Act, 21 U.S.C.section 360bbb-3(b)(1), unless the authorization is terminated  or revoked sooner.       Influenza A by PCR NEGATIVE NEGATIVE Final   Influenza B by PCR NEGATIVE NEGATIVE Final    Comment: (NOTE) The Xpert Xpress SARS-CoV-2/FLU/RSV plus assay is intended as an aid in the diagnosis of influenza from Nasopharyngeal swab specimens and should not be used as a sole basis for treatment. Nasal washings and aspirates are unacceptable for Xpert Xpress SARS-CoV-2/FLU/RSV testing.  Fact Sheet for Patients: BloggerCourse.com  Fact Sheet for Healthcare Providers: SeriousBroker.it  This test is not yet approved or cleared by the [place] FDA and has been authorized for detection and/or diagnosis of SARS-CoV-2 by FDA under an Emergency Use Authorization (EUA). This EUA will remain in effect (meaning this test can be used) for the duration of the COVID-19 declaration under Section 564(b)(1) of the Act, 21 U.S.C. section 360bbb-3(b)(1), unless the authorization is terminated or revoked.  Performed at [facility], [address], [place], [place] [address]      Medications:    enoxaparin  (LOVENOX) injection  40 mg Subcutaneous Q24H   pantoprazole (PROTONIX) IV  40 mg Intravenous Q24H   thiamine injection  100 mg Intravenous Daily   Continuous Infusions:  lactated ringers 200 mL/hr at 10/22/20 0231      LOS: 0 days   Marinda Elk  [facility]  10/22/2020, 9:39 AM

## 2020-10-23 LAB — GLUCOSE, CAPILLARY
Glucose-Capillary: 93 mg/dL (ref 70–99)
Glucose-Capillary: 91 mg/dL (ref 70–99)

## 2020-10-23 MED ORDER — PANTOPRAZOLE SODIUM 40 MG PO TBEC
40.0000 mg | DELAYED_RELEASE_TABLET | Freq: Two times a day (BID) | ORAL | Status: DC
  Administered 2020-10-23 – 2020-10-25 (×5): 40 mg via ORAL
  Filled 2020-10-23 (×5): qty 1

## 2020-10-23 MED ORDER — MORPHINE SULFATE (PF) 4 MG/ML IV SOLN
4.0000 mg | INTRAVENOUS | Status: DC | PRN
  Administered 2020-10-23 – 2020-10-24 (×4): 4 mg via INTRAVENOUS
  Filled 2020-10-23 (×4): qty 1

## 2020-10-23 MED ORDER — SODIUM CHLORIDE 0.9 % IV SOLN: INTRAVENOUS | Status: AC

## 2020-10-23 NOTE — Progress Notes (Signed)
[facility] PROGRESS NOTE    Progress Note  Maureen Torres  YBW:[id number] DOB: [birth year] DOA: 10/21/2020 PCP: Pcp, No     Brief Narrative:   Maureen Torres is an 35 y.o. female past medical history of bypass Roux-en-Y gastric surgery, alcoholic pancreatitis history of cholecystectomy presents to the ER for the second time in the last 24 hours for abdominal pain mostly in the epigastric area with some nausea and vomiting denies any diarrhea.  In the ED abdominal ultrasound was unremarkable, lipase mildly elevated patient relates drinking wine about 4 days prior to admission CT scan of the abdomen pelvis showed acute pancreatitis   Assessment/Plan:   Acute alcoholic pancreatitis: She relates her pain is well controlled, she would like to try clear liquid diet discontinue narcotics give her a clear liquid diet advance as tolerated. Continue IV fluids for an additional 12 hours monitor strict I's and O's.    DVT prophylaxis: lovenox Family Communication:none Status is: Inpatient  Remains inpatient appropriate because:Hemodynamically unstable  Dispo: The patient is from: Home              Anticipated d/c is to: Home              Patient currently is not medically stable to d/c.   Difficult to place patient No    Code Status:     Code Status Orders  (From admission, onward)           Start     Ordered   10/22/20 0324  Full code  Continuous        10/22/20 0325           Code Status History     This patient has a current code status but no historical code status.         IV Access:   Peripheral IV   Procedures and diagnostic studies:   CT Abdomen Pelvis W Contrast  Result Date: 10/21/2020 CLINICAL DATA:  Abdominal pain/bloating x2 days EXAM: CT ABDOMEN AND PELVIS WITH CONTRAST TECHNIQUE: Multidetector CT imaging of the abdomen and pelvis was performed using the standard protocol following bolus administration of intravenous contrast. CONTRAST:   47mL OMNIPAQUE IOHEXOL 350 MG/ML SOLN COMPARISON:  None. FINDINGS: Lower chest: Faint ground-glass opacity/mosaic attenuation at the lung bases, possibly reflecting sequela of prior atypical infection. Hepatobiliary: Liver is within normal limits. Status post cholecystectomy. No intrahepatic or extrahepatic ductal dilatation. Pancreas: Inflammatory changes along the uncinate process (series 2/image 29), suggesting mild acute pancreatitis. No peripancreatic fluid collection/walled-off necrosis. Spleen: Within normal limits. Adrenals/Urinary Tract: Adrenal glands are within normal limits. Kidneys are within normal limits.  No hydronephrosis. Bladder is within normal limits. 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 No evidence of bowel obstruction. Normal appendix (series 2/image 52). Vascular/Lymphatic: No evidence of abdominal aortic aneurysm. No suspicious abdominopelvic lymphadenopathy. Reproductive: Anterior position of the uterus, suggesting prior C-section. Bilateral ovaries are within normal limits, noting a right corpus luteum. Other: No abdominopelvic ascites. Musculoskeletal: Visualized osseous structures are within normal limits. IMPRESSION: Suspected mild acute pancreatitis.  No associated complication. Electronically Signed   By: Charline Bills M.D.   On: 10/21/2020 23:28     Medical Consultants:   None.   Subjective:    Maureen Torres pain is significantly improved she is hungry would like to eat.  Objective:    Vitals:   10/22/20 1111 10/22/20 1347 10/22/20 2154 10/23/20 0532  BP: (!) 140/95 132/89 120/78 133/86  Pulse: 61 (!) 51 63 62  Resp: 16 18 14 14   Temp: 97.8 F (36.6 C) 98.4 F (36.9 C) 98.9 F (37.2 C) 98.3 F (36.8 C)  TempSrc: Oral Oral Oral Oral  SpO2: 100% 100% 99% 100%  Weight:      Height:       SpO2: 100 %   Intake/Output Summary (Last 24 hours) at 10/23/2020 1019 Last data filed at 10/23/2020 0400 Gross per 24 hour  Intake  1120.07 ml  Output 1100 ml  Net 20.07 ml    Filed Weights   10/21/20 1917  Weight: 84.4 kg    Exam: General exam: In no acute distress. Respiratory system: Good air movement and clear to auscultation. Cardiovascular system: S1 & S2 heard, RRR. No JVD. Gastrointestinal system: Abdomen is nondistended, soft and nontender.  Extremities: No pedal edema. Skin: No rashes, lesions or ulcers   Data Reviewed:    Labs: Basic Metabolic Panel: Recent Labs  Lab 10/20/20 1309 10/21/20 1951 10/22/20 0433  NA 138 133* 137  K 3.8 3.9 3.7  CL 107 103 104  CO2 22 19* 25  GLUCOSE 95 88 95  BUN 11 7 7   CREATININE 0.71 0.60 0.55  CALCIUM 9.1 9.1 8.8*    GFR Estimated Creatinine Clearance: 93.8 mL/min (by C-G formula based on SCr of 0.55 mg/dL). Liver Function Tests: Recent Labs  Lab 10/20/20 1309 10/21/20 1951 10/22/20 0433  AST 20 17 43*  ALT 17 14 17   ALKPHOS 140* 144* 145*  BILITOT 1.0 1.6* 1.2  PROT 7.5 7.3 6.4*  ALBUMIN 4.1 3.9 3.4*    Recent Labs  Lab 10/20/20 1309 10/21/20 1951  LIPASE 52* 35    No results for input(s): AMMONIA in the last 168 hours. Coagulation profile No results for input(s): INR, PROTIME in the last 168 hours. COVID-19 Labs  No results for input(s): DDIMER, FERRITIN, LDH, CRP in the last 72 hours.  Lab Results  Component Value Date   SARSCOV2NAA NEGATIVE 10/22/2020    CBC: Recent Labs  Lab 10/20/20 1309 10/21/20 1951 10/22/20 0433  WBC 7.3 7.5 5.9  NEUTROABS  --   --  3.7  HGB 13.0 13.1 12.1  HCT 38.4 39.1 36.3  MCV 95.0 96.3 96.5  PLT 266 250 229    Cardiac Enzymes: No results for input(s): CKTOTAL, CKMB, CKMBINDEX, TROPONINI in the last 168 hours. BNP (last 3 results) No results for input(s): PROBNP in the last 8760 hours. CBG: Recent Labs  Lab 10/22/20 0929 10/22/20 2356 10/23/20 0727  GLUCAP 89 117* 93    D-Dimer: No results for input(s): DDIMER in the last 72 hours. Hgb A1c: No results for input(s):  HGBA1C in the last 72 hours. Lipid Profile: Recent Labs    10/22/20 0433  TRIG 120    Thyroid function studies: No results for input(s): TSH, T4TOTAL, T3FREE, THYROIDAB in the last 72 hours.  Invalid input(s): FREET3 Anemia work up: No results for input(s): VITAMINB12, FOLATE, FERRITIN, TIBC, IRON, RETICCTPCT in the last 72 hours. Sepsis Labs: Recent Labs  Lab 10/20/20 1309 10/21/20 1951 10/22/20 0433  WBC 7.3 7.5 5.9    Microbiology Recent Results (from the past 240 hour(s))  Resp Panel by RT-PCR (Flu A&B, Covid) Nasopharyngeal Swab     Status: None   Collection Time: 10/22/20  2:34 AM   Specimen: Nasopharyngeal Swab; Nasopharyngeal(NP) swabs in vial transport medium  Result Value Ref Range Status   SARS Coronavirus 2 by RT PCR NEGATIVE NEGATIVE Final    Comment: (NOTE) SARS-CoV-2 target nucleic  acids are NOT DETECTED.  The SARS-CoV-2 RNA is generally detectable in upper respiratory specimens during the acute phase of infection. The lowest concentration of SARS-CoV-2 viral copies this assay can detect is 138 copies/mL. A negative result does not preclude SARS-Cov-2 infection and should not be used as the sole basis for treatment or other patient management decisions. A negative result may occur with  improper specimen collection/handling, submission of specimen other than nasopharyngeal swab, presence of viral mutation(s) within the areas targeted by this assay, and inadequate number of viral copies(<138 copies/mL). A negative result must be combined with clinical observations, patient history, and epidemiological information. The expected result is Negative.  Fact Sheet for Patients:  BloggerCourse.com  Fact Sheet for Healthcare Providers:  SeriousBroker.it  This test is no t yet approved or cleared by the [place] FDA and  has been authorized for detection and/or diagnosis of SARS-CoV-2 by FDA under an  Emergency Use Authorization (EUA). This EUA will remain  in effect (meaning this test can be used) for the duration of the COVID-19 declaration under Section 564(b)(1) of the Act, 21 U.S.C.section 360bbb-3(b)(1), unless the authorization is terminated  or revoked sooner.       Influenza A by PCR NEGATIVE NEGATIVE Final   Influenza B by PCR NEGATIVE NEGATIVE Final    Comment: (NOTE) The Xpert Xpress SARS-CoV-2/FLU/RSV plus assay is intended as an aid in the diagnosis of influenza from Nasopharyngeal swab specimens and should not be used as a sole basis for treatment. Nasal washings and aspirates are unacceptable for Xpert Xpress SARS-CoV-2/FLU/RSV testing.  Fact Sheet for Patients: BloggerCourse.com  Fact Sheet for Healthcare Providers: SeriousBroker.it  This test is not yet approved or cleared by the [place] FDA and has been authorized for detection and/or diagnosis of SARS-CoV-2 by FDA under an Emergency Use Authorization (EUA). This EUA will remain in effect (meaning this test can be used) for the duration of the COVID-19 declaration under Section 564(b)(1) of the Act, 21 U.S.C. section 360bbb-3(b)(1), unless the authorization is terminated or revoked.  Performed at [facility], [address], [place], [place] [address]      Medications:    enoxaparin (LOVENOX) injection  40 mg Subcutaneous Q24H   pantoprazole (PROTONIX) IV  40 mg Intravenous Q24H   thiamine injection  100 mg Intravenous Daily   Continuous Infusions:      LOS: 1 day   Marinda Elk  [facility]  10/23/2020, 10:19 AM

## 2020-10-23 NOTE — Plan of Care (Signed)
?  Problem: Education: ?Goal: Knowledge of General Education information will improve ?Description: Including pain rating scale, medication(s)/side effects and non-pharmacologic comfort measures ?Outcome: Progressing ?  ?Problem: Health Behavior/Discharge Planning: ?Goal: Ability to manage health-related needs will improve ?Outcome: Progressing ?  ?Problem: Coping: ?Goal: Level of anxiety will decrease ?Outcome: Progressing ?  ?

## 2020-10-24 LAB — GLUCOSE, CAPILLARY
Glucose-Capillary: 73 mg/dL (ref 70–99)
Glucose-Capillary: 93 mg/dL (ref 70–99)
Glucose-Capillary: 96 mg/dL (ref 70–99)

## 2020-10-24 LAB — BASIC METABOLIC PANEL
Anion gap: 6 (ref 5–15)
BUN: <5 mg/dL — ABNORMAL LOW (ref 6–20)
CO2: 25 mmol/L (ref 22–32)
Calcium: 8.2 mg/dL — ABNORMAL LOW (ref 8.9–10.3)
Chloride: 106 mmol/L (ref 98–111)
Creatinine, Ser: 0.67 mg/dL (ref 0.44–1.00)
GFR, Estimated: >60 mL/min (ref >60)
Glucose, Bld: 86 mg/dL (ref 70–99)
Potassium: 3.5 mmol/L (ref 3.5–5.1)
Sodium: 137 mmol/L (ref 135–145)

## 2020-10-24 MED ORDER — ACETAMINOPHEN 325 MG PO TABS
650.0000 mg | ORAL_TABLET | Freq: Four times a day (QID) | ORAL | Status: DC | PRN
  Administered 2020-10-24 – 2020-10-25 (×2): 650 mg via ORAL
  Filled 2020-10-24 (×2): qty 2

## 2020-10-24 NOTE — Progress Notes (Signed)
[facility] PROGRESS NOTE    Progress Note  Maureen Torres  [id number] DOB: 21-Feb-1986 DOA: 10/21/2020 PCP: Pcp, No     Brief Narrative:   Maureen Torres is an 35 y.o. female past medical history of bypass Roux-en-Y gastric surgery, alcoholic pancreatitis history of cholecystectomy presents to the ER for the second time in the last 24 hours for abdominal pain mostly in the epigastric area with some nausea and vomiting denies any diarrhea.  In the ED abdominal ultrasound was unremarkable, lipase mildly elevated patient relates drinking wine about 4 days prior to admission CT scan of the abdomen pelvis showed acute pancreatitis   Assessment/Plan:   Acute alcoholic pancreatitis: No signs of withdrawal, she was started on diet yesterday and started having abdominal pain was placed n.p.o., started on IV fluids and restarted back on her analgesic. She relates today she would like to try it and would like to go slow. KVO IV fluids discontinue narcotics. Will start on clear liquid diet advance as tolerated.    DVT prophylaxis: lovenox Family Communication:none Status is: Inpatient  Remains inpatient appropriate because:Hemodynamically unstable  Dispo: The patient is from: Home              Anticipated d/c is to: Home              Patient currently is not medically stable to d/c.   Difficult to place patient No    Code Status:     Code Status Orders  (From admission, onward)           Start     Ordered   10/22/20 0324  Full code  Continuous        10/22/20 0325           Code Status History     This patient has a current code status but no historical code status.         IV Access:   Peripheral IV   Procedures and diagnostic studies:   No results found.   Medical Consultants:   None.   Subjective:    [name] had pain yesterday after eating twice, and to be placed n.p.o. she relates her pain this morning is better, she would like  to try diet again she relates she will go slow.  Objective:    Vitals:   10/23/20 0532 10/23/20 1356 10/23/20 2227 10/24/20 0540  BP: 133/86 131/79 116/74 104/88  Pulse: 62 (!) 57 73 65  Resp: 14 18 18 18   Temp: 98.3 F (36.8 C) 98.5 F (36.9 C) 98.4 F (36.9 C) 98 F (36.7 C)  TempSrc: Oral Oral Oral Oral  SpO2: 100% 100% 100% 99%  Weight:      Height:       SpO2: 99 %   Intake/Output Summary (Last 24 hours) at 10/24/2020 0957 Last data filed at 10/24/2020 0543 Gross per 24 hour  Intake 3430.54 ml  Output 825 ml  Net 2605.54 ml    Filed Weights   10/21/20 1917  Weight: 84.4 kg    Exam: General exam: In no acute distress. Respiratory system: Good air movement and clear to auscultation. Cardiovascular system: S1 & S2 heard, RRR. No JVD. Gastrointestinal system: Abdomen is nondistended, soft and nontender.  Extremities: No pedal edema. Skin: No rashes, lesions or ulcers  Data Reviewed:    Labs: Basic Metabolic Panel: Recent Labs  Lab 10/20/20 1309 10/21/20 1951 10/22/20 0433 10/24/20 0459  NA 138 133* 137 137  K 3.8  3.9 3.7 3.5  CL 107 103 104 106  CO2 22 19* 25 25  GLUCOSE 95 88 95 86  BUN 11 7 7  <5*  CREATININE 0.71 0.60 0.55 0.67  CALCIUM 9.1 9.1 8.8* 8.2*    GFR Estimated Creatinine Clearance: 93.8 mL/min (by C-G formula based on SCr of 0.67 mg/dL). Liver Function Tests: Recent Labs  Lab 10/20/20 1309 10/21/20 1951 10/22/20 0433  AST 20 17 43*  ALT 17 14 17   ALKPHOS 140* 144* 145*  BILITOT 1.0 1.6* 1.2  PROT 7.5 7.3 6.4*  ALBUMIN 4.1 3.9 3.4*    Recent Labs  Lab 10/20/20 1309 10/21/20 1951  LIPASE 52* 35    No results for input(s): AMMONIA in the last 168 hours. Coagulation profile No results for input(s): INR, PROTIME in the last 168 hours. COVID-19 Labs  No results for input(s): DDIMER, FERRITIN, LDH, CRP in the last 72 hours.  Lab Results  Component Value Date   SARSCOV2NAA NEGATIVE 10/22/2020    CBC: Recent Labs   Lab 10/20/20 1309 10/21/20 1951 10/22/20 0433  WBC 7.3 7.5 5.9  NEUTROABS  --   --  3.7  HGB 13.0 13.1 12.1  HCT 38.4 39.1 36.3  MCV 95.0 96.3 96.5  PLT 266 250 229    Cardiac Enzymes: No results for input(s): CKTOTAL, CKMB, CKMBINDEX, TROPONINI in the last 168 hours. BNP (last 3 results) No results for input(s): PROBNP in the last 8760 hours. CBG: Recent Labs  Lab 10/22/20 2356 10/23/20 0727 10/23/20 1603 10/24/20 0020 10/24/20 0737  GLUCAP 117* 93 91 73 93    D-Dimer: No results for input(s): DDIMER in the last 72 hours. Hgb A1c: No results for input(s): HGBA1C in the last 72 hours. Lipid Profile: Recent Labs    10/22/20 0433  TRIG 120    Thyroid function studies: No results for input(s): TSH, T4TOTAL, T3FREE, THYROIDAB in the last 72 hours.  Invalid input(s): FREET3 Anemia work up: No results for input(s): VITAMINB12, FOLATE, FERRITIN, TIBC, IRON, RETICCTPCT in the last 72 hours. Sepsis Labs: Recent Labs  Lab 10/20/20 1309 10/21/20 1951 10/22/20 0433  WBC 7.3 7.5 5.9    Microbiology Recent Results (from the past 240 hour(s))  Resp Panel by RT-PCR (Flu A&B, Covid) Nasopharyngeal Swab     Status: None   Collection Time: 10/22/20  2:34 AM   Specimen: Nasopharyngeal Swab; Nasopharyngeal(NP) swabs in vial transport medium  Result Value Ref Range Status   SARS Coronavirus 2 by RT PCR NEGATIVE NEGATIVE Final    Comment: (NOTE) SARS-CoV-2 target nucleic acids are NOT DETECTED.  The SARS-CoV-2 RNA is generally detectable in upper respiratory specimens during the acute phase of infection. The lowest concentration of SARS-CoV-2 viral copies this assay can detect is 138 copies/mL. A negative result does not preclude SARS-Cov-2 infection and should not be used as the sole basis for treatment or other patient management decisions. A negative result may occur with  improper specimen collection/handling, submission of specimen other than nasopharyngeal swab,  presence of viral mutation(s) within the areas targeted by this assay, and inadequate number of viral copies(<138 copies/mL). A negative result must be combined with clinical observations, patient history, and epidemiological information. The expected result is Negative.  Fact Sheet for Patients:  12/22/20  Fact Sheet for Healthcare Providers:  12/22/20  This test is no t yet approved or cleared by the [employer] FDA and  has been authorized for detection and/or diagnosis of SARS-CoV-2 by FDA under an Emergency Use  Authorization (EUA). This EUA will remain  in effect (meaning this test can be used) for the duration of the COVID-19 declaration under Section 564(b)(1) of the Act, 21 U.S.C.section 360bbb-3(b)(1), unless the authorization is terminated  or revoked sooner.       Influenza A by PCR NEGATIVE NEGATIVE Final   Influenza B by PCR NEGATIVE NEGATIVE Final    Comment: (NOTE) The Xpert Xpress SARS-CoV-2/FLU/RSV plus assay is intended as an aid in the diagnosis of influenza from Nasopharyngeal swab specimens and should not be used as a sole basis for treatment. Nasal washings and aspirates are unacceptable for Xpert Xpress SARS-CoV-2/FLU/RSV testing.  Fact Sheet for Patients: BloggerCourse.com  Fact Sheet for Healthcare Providers: SeriousBroker.it  This test is not yet approved or cleared by the [place] FDA and has been authorized for detection and/or diagnosis of SARS-CoV-2 by FDA under an Emergency Use Authorization (EUA). This EUA will remain in effect (meaning this test can be used) for the duration of the COVID-19 declaration under Section 564(b)(1) of the Act, 21 U.S.C. section 360bbb-3(b)(1), unless the authorization is terminated or revoked.  Performed at [facility], [address] [place], [place] [address]       Medications:    enoxaparin (LOVENOX) injection  40 mg Subcutaneous Q24H   pantoprazole  40 mg Oral BID   thiamine injection  100 mg Intravenous Daily   Continuous Infusions:  sodium chloride 100 mL/hr at 10/23/20 1758       LOS: 2 days   Marinda Elk  [facility]  10/24/2020, 9:57 AM

## 2020-10-25 LAB — GLUCOSE, CAPILLARY
Glucose-Capillary: 101 mg/dL — ABNORMAL HIGH (ref 70–99)
Glucose-Capillary: 136 mg/dL — ABNORMAL HIGH (ref 70–99)

## 2020-10-25 NOTE — Discharge Summary (Signed)
Physician Discharge Summary  Maureen Torres FYB:[id number] DOB: 04-21-1985 DOA: 10/21/2020  PCP: Oneita Hurt, No  Admit date: 10/21/2020 Discharge date: 10/25/2020  Admitted From: home Disposition:  home  Recommendations for Outpatient Follow-up:  Follow up with PCP in 1-2 weeks Please obtain BMP/CBC in one week   Home Health:no Equipment/Devices:none  Discharge Condition:Stable CODE STATUS:Full Diet recommendation: Heart Healthy   Brief/Interim Summary: 35 y.o. female past medical history of bypass Roux-en-Y gastric surgery, alcoholic pancreatitis history of cholecystectomy presents to the ER for the second time in the last 24 hours for abdominal pain mostly in the epigastric area with some nausea and vomiting denies any diarrhea.  In the ED abdominal ultrasound was unremarkable, lipase mildly elevated patient relates drinking wine about 4 days prior to admission CT scan of the abdomen pelvis showed acute pancreatitis  Discharge Diagnoses:  Principal Problem:   Acute pancreatitis  Acute alcoholic pancreatitis: States.  No signs of withdrawal she was placed n.p.o. IV fluids and IV narcotics. After 48 hours chest pain subsided, she her diet was advanced she tolerated that well. She has been counseled about binge drinking.  Discharge Instructions  Discharge Instructions     Diet - low sodium heart healthy   Complete by: As directed    Increase activity slowly   Complete by: As directed       Allergies as of 10/25/2020       Reactions   Aspirin Other (See Comments)   Stomach ulcer   Ibuprofen Other (See Comments)   Gastric Bypass--advised to avoid   Nsaids Other (See Comments)   Gastric bypass Hx gastric bypass        Medication List     TAKE these medications    acetaminophen 500 MG tablet Commonly known as: TYLENOL Take 1,000 mg by mouth every 6 (six) hours as needed for mild pain.   dicyclomine 20 MG tablet Commonly known as: BENTYL Take 1 tablet (20 mg total)  by mouth 2 (two) times daily.   famotidine 20 MG tablet Commonly known as: PEPCID Take 1 tablet (20 mg total) by mouth 2 (two) times daily.   HYDROcodone-acetaminophen 5-325 MG tablet Commonly known as: NORCO/VICODIN Take 1 tablet by mouth every 6 (six) hours as needed.   Lidocaine 4 % Ptch Apply 1 patch topically daily as needed (pain).   multivitamin tablet Take 1 tablet by mouth daily.   ondansetron 4 MG disintegrating tablet Commonly known as: Zofran ODT Dissolve 1 tablet (4 mg total) by mouth every 8 (eight) hours as needed for nausea or vomiting.        Allergies  Allergen Reactions   Aspirin Other (See Comments)    Stomach ulcer   Ibuprofen Other (See Comments)    Gastric Bypass--advised to avoid   Nsaids Other (See Comments)    Gastric bypass Hx gastric bypass     Consultations: None   Procedures/Studies: CT Abdomen Pelvis W Contrast  Result Date: 10/21/2020 CLINICAL DATA:  Abdominal pain/bloating x2 days EXAM: CT ABDOMEN AND PELVIS WITH CONTRAST TECHNIQUE: Multidetector CT imaging of the abdomen and pelvis was performed using the standard protocol following bolus administration of intravenous contrast. CONTRAST:  54mL OMNIPAQUE IOHEXOL 350 MG/ML SOLN COMPARISON:  None. FINDINGS: Lower chest: Faint ground-glass opacity/mosaic attenuation at the lung bases, possibly reflecting sequela of prior atypical infection. Hepatobiliary: Liver is within normal limits. Status post cholecystectomy. No intrahepatic or extrahepatic ductal dilatation. Pancreas: Inflammatory changes along the uncinate process (series 2/image 29), suggesting mild acute pancreatitis. No  peripancreatic fluid collection/walled-off necrosis. Spleen: Within normal limits. Adrenals/Urinary Tract: Adrenal glands are within normal limits. Kidneys are within normal limits.  No hydronephrosis. Bladder is within normal limits. 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 No evidence of bowel  obstruction. Normal appendix (series 2/image 52). Vascular/Lymphatic: No evidence of abdominal aortic aneurysm. No suspicious abdominopelvic lymphadenopathy. Reproductive: Anterior position of the uterus, suggesting prior C-section. Bilateral ovaries are within normal limits, noting a right corpus luteum. Other: No abdominopelvic ascites. Musculoskeletal: Visualized osseous structures are within normal limits. IMPRESSION: Suspected mild acute pancreatitis.  No associated complication. Electronically Signed   By: Charline Bills M.D.   On: 10/21/2020 23:28   US Abdomen Limited RUQ (LIVER/GB)  Result Date: 10/20/2020 CLINICAL DATA:  Right upper quadrant pain EXAM: ULTRASOUND ABDOMEN LIMITED RIGHT UPPER QUADRANT COMPARISON:  None. FINDINGS: Gallbladder: Cholecystectomy. Common bile duct: Diameter: 7 mm, within normal limits Liver: No focal lesion identified. Within normal limits in parenchymal echogenicity. Portal vein is patent on color Doppler imaging with normal direction of blood flow towards the liver. Other: None. IMPRESSION: Unremarkable right upper quadrant ultrasound.  Post cholecystectomy. Electronically Signed   By: Guadlupe Spanish M.D.   On: 10/20/2020 14:14   (Echo, Carotid, EGD, Colonoscopy, ERCP)    Subjective: No complaints  Discharge Exam: Vitals:   10/25/20 0551 10/25/20 0551  BP: 118/76 118/76  Pulse: 60 60  Resp: 16 16  Temp: 98 F (36.7 C) 98 F (36.7 C)  SpO2: 100% 100%   Vitals:   10/24/20 1358 10/24/20 2132 10/25/20 0551 10/25/20 0551  BP: (!) 101/57 106/60 118/76 118/76  Pulse: 62 62 60 60  Resp: 18 16 16 16   Temp: 98.7 F (37.1 C) 98.5 F (36.9 C) 98 F (36.7 C) 98 F (36.7 C)  TempSrc: Oral Oral Oral Oral  SpO2: 100% 100% 100% 100%  Weight:      Height:        General: Pt is alert, awake, not in acute distress Cardiovascular: RRR, S1/S2 +, no rubs, no gallops Respiratory: CTA bilaterally, no wheezing, no rhonchi Abdominal: Soft, NT, ND, bowel sounds  + Extremities: no edema, no cyanosis    The results of significant diagnostics from this hospitalization (including imaging, microbiology, ancillary and laboratory) are listed below for reference.     Microbiology: Recent Results (from the past 240 hour(s))  Resp Panel by RT-PCR (Flu A&B, Covid) Nasopharyngeal Swab     Status: None   Collection Time: 10/22/20  2:34 AM   Specimen: Nasopharyngeal Swab; Nasopharyngeal(NP) swabs in vial transport medium  Result Value Ref Range Status   SARS Coronavirus 2 by RT PCR NEGATIVE NEGATIVE Final    Comment: (NOTE) SARS-CoV-2 target nucleic acids are NOT DETECTED.  The SARS-CoV-2 RNA is generally detectable in upper respiratory specimens during the acute phase of infection. The lowest concentration of SARS-CoV-2 viral copies this assay can detect is 138 copies/mL. A negative result does not preclude SARS-Cov-2 infection and should not be used as the sole basis for treatment or other patient management decisions. A negative result may occur with  improper specimen collection/handling, submission of specimen other than nasopharyngeal swab, presence of viral mutation(s) within the areas targeted by this assay, and inadequate number of viral copies(<138 copies/mL). A negative result must be combined with clinical observations, patient history, and epidemiological information. The expected result is Negative.  Fact Sheet for Patients:  12/22/20  Fact Sheet for Healthcare Providers:  BloggerCourse.com  This test is no t yet approved or  cleared by the [place] and  has been authorized for detection and/or diagnosis of SARS-CoV-2 by FDA under an Emergency Use Authorization (EUA). This EUA will remain  in effect (meaning this test can be used) for the duration of the COVID-19 declaration under Section 564(b)(1) of the Act, 21 U.S.C.section 360bbb-3(b)(1), unless the authorization  is terminated  or revoked sooner.       Influenza A by PCR NEGATIVE NEGATIVE Final   Influenza B by PCR NEGATIVE NEGATIVE Final    Comment: (NOTE) The Xpert Xpress SARS-CoV-2/FLU/RSV plus assay is intended as an aid in the diagnosis of influenza from Nasopharyngeal swab specimens and should not be used as a sole basis for treatment. Nasal washings and aspirates are unacceptable for Xpert Xpress SARS-CoV-2/FLU/RSV testing.  Fact Sheet for Patients: BloggerCourse.com  Fact Sheet for Healthcare Providers: SeriousBroker.it  This test is not yet approved or cleared by the [place] FDA and has been authorized for detection and/or diagnosis of SARS-CoV-2 by FDA under an Emergency Use Authorization (EUA). This EUA will remain in effect (meaning this test can be used) for the duration of the COVID-19 declaration under Section 564(b)(1) of the Act, 21 U.S.C. section 360bbb-3(b)(1), unless the authorization is terminated or revoked.  Performed at [facility], [address], [place], [place] [address]      Labs: BNP (last 3 results) No results for input(s): BNP in the last 8760 hours. Basic Metabolic Panel: Recent Labs  Lab 10/20/20 1309 10/21/20 1951 10/22/20 0433 10/24/20 0459  NA 138 133* 137 137  K 3.8 3.9 3.7 3.5  CL 107 103 104 106  CO2 22 19* 25 25  GLUCOSE 95 88 95 86  BUN 11 7 7  <5*  CREATININE 0.71 0.60 0.55 0.67  CALCIUM 9.1 9.1 8.8* 8.2*   Liver Function Tests: Recent Labs  Lab 10/20/20 1309 10/21/20 1951 10/22/20 0433  AST 20 17 43*  ALT 17 14 17   ALKPHOS 140* 144* 145*  BILITOT 1.0 1.6* 1.2  PROT 7.5 7.3 6.4*  ALBUMIN 4.1 3.9 3.4*   Recent Labs  Lab 10/20/20 1309 10/21/20 1951  LIPASE 52* 35   No results for input(s): AMMONIA in the last 168 hours. CBC: Recent Labs  Lab 10/20/20 1309 10/21/20 1951 10/22/20 0433  WBC 7.3 7.5 5.9  NEUTROABS  --   --  3.7  HGB 13.0  13.1 12.1  HCT 38.4 39.1 36.3  MCV 95.0 96.3 96.5  PLT 266 250 229   Cardiac Enzymes: No results for input(s): CKTOTAL, CKMB, CKMBINDEX, TROPONINI in the last 168 hours. BNP: Invalid input(s): POCBNP CBG: Recent Labs  Lab 10/24/20 0020 10/24/20 0737 10/24/20 1604 10/25/20 0012 10/25/20 0748  GLUCAP 73 93 96 101* 136*   D-Dimer No results for input(s): DDIMER in the last 72 hours. Hgb A1c No results for input(s): HGBA1C in the last 72 hours. Lipid Profile No results for input(s): CHOL, HDL, LDLCALC, TRIG, CHOLHDL, LDLDIRECT in the last 72 hours. Thyroid function studies No results for input(s): TSH, T4TOTAL, T3FREE, THYROIDAB in the last 72 hours.  Invalid input(s): FREET3 Anemia work up No results for input(s): VITAMINB12, FOLATE, FERRITIN, TIBC, IRON, RETICCTPCT in the last 72 hours. Urinalysis    Component Value Date/Time   COLORURINE YELLOW 10/21/2020 1939   APPEARANCEUR HAZY (A) 10/21/2020 1939   LABSPEC 1.010 10/21/2020 1939   PHURINE 5.0 10/21/2020 1939   GLUCOSEU NEGATIVE 10/21/2020 1939   HGBUR SMALL (A) 10/21/2020 1939   BILIRUBINUR NEGATIVE  10/21/2020 1939   KETONESUR 20 (A) 10/21/2020 1939   PROTEINUR NEGATIVE 10/21/2020 1939   NITRITE NEGATIVE 10/21/2020 1939   LEUKOCYTESUR TRACE (A) 10/21/2020 1939   Sepsis Labs Invalid input(s): PROCALCITONIN,  WBC,  LACTICIDVEN Microbiology Recent Results (from the past 240 hour(s))  Resp Panel by RT-PCR (Flu A&B, Covid) Nasopharyngeal Swab     Status: None   Collection Time: 10/22/20  2:34 AM   Specimen: Nasopharyngeal Swab; Nasopharyngeal(NP) swabs in vial transport medium  Result Value Ref Range Status   SARS Coronavirus 2 by RT PCR NEGATIVE NEGATIVE Final    Comment: (NOTE) SARS-CoV-2 target nucleic acids are NOT DETECTED.  The SARS-CoV-2 RNA is generally detectable in upper respiratory specimens during the acute phase of infection. The lowest concentration of SARS-CoV-2 viral copies this assay can detect  is 138 copies/mL. A negative result does not preclude SARS-Cov-2 infection and should not be used as the sole basis for treatment or other patient management decisions. A negative result may occur with  improper specimen collection/handling, submission of specimen other than nasopharyngeal swab, presence of viral mutation(s) within the areas targeted by this assay, and inadequate number of viral copies(<138 copies/mL). A negative result must be combined with clinical observations, patient history, and epidemiological information. The expected result is Negative.  Fact Sheet for Patients:  BloggerCourse.comhttps://www.fda.gov/media/152166/download  Fact Sheet for Healthcare Providers:  SeriousBroker.ithttps://www.fda.gov/media/152162/download  This test is no t yet approved or cleared by the [place] FDA and  has been authorized for detection and/or diagnosis of SARS-CoV-2 by FDA under an Emergency Use Authorization (EUA). This EUA will remain  in effect (meaning this test can be used) for the duration of the COVID-19 declaration under Section 564(b)(1) of the Act, 21 U.S.C.section 360bbb-3(b)(1), unless the authorization is terminated  or revoked sooner.       Influenza A by PCR NEGATIVE NEGATIVE Final   Influenza B by PCR NEGATIVE NEGATIVE Final    Comment: (NOTE) The Xpert Xpress SARS-CoV-2/FLU/RSV plus assay is intended as an aid in the diagnosis of influenza from Nasopharyngeal swab specimens and should not be used as a sole basis for treatment. Nasal washings and aspirates are unacceptable for Xpert Xpress SARS-CoV-2/FLU/RSV testing.  Fact Sheet for Patients: BloggerCourse.comhttps://www.fda.gov/media/152166/download  Fact Sheet for Healthcare Providers: SeriousBroker.ithttps://www.fda.gov/media/152162/download  This test is not yet approved or cleared by the [place] FDA and has been authorized for detection and/or diagnosis of SARS-CoV-2 by FDA under an Emergency Use Authorization (EUA). This EUA will remain in effect  (meaning this test can be used) for the duration of the COVID-19 declaration under Section 564(b)(1) of the Act, 21 U.S.C. section 360bbb-3(b)(1), unless the authorization is terminated or revoked.  Performed at [facility], [address] [place], [place] [address]     SIGNED:   Marinda ElkAbraham Feliz Ortiz, MD  [facility] 10/25/2020, 9:52 AM Pager   If 7PM-7AM, please contact night-coverage [email or web address] Password [email or web address]

## 2020-10-25 NOTE — Discharge Instructions (Signed)
Maureen Torres was admitted to the Hospital on 10/21/2020 and Discharged on Discharge Date 10/25/2020 and should be excused from work/school   for 4  days starting 10/21/2020 , may return to work/school without any restrictions.  Call Lambert Keto MD, [facility] [phone] with questions.  Marinda Elk M.D on 10/25/2020,at 4:17 PM  [facility] Office  [phone]

## 2020-10-25 NOTE — Progress Notes (Signed)
Nurse reviewed discharge instructions with pt.  Pt verbalized understanding of discharge instructions.  No new medications.  Pt given a work note via AVS.  No concerns at time of discharge.

## 2020-11-06 ENCOUNTER — Encounter (HOSPITAL_COMMUNITY): Payer: Self-pay

## 2020-11-06 ENCOUNTER — Emergency Department (HOSPITAL_COMMUNITY)
Admission: EM | Admit: 2020-11-06 | Discharge: 2020-11-07 | Disposition: A | Discharge status: Home / Self Care | Payer: Medicaid Other | Attending: Emergency Medicine | Admitting: Emergency Medicine

## 2020-11-06 ENCOUNTER — Emergency Department (HOSPITAL_COMMUNITY): Payer: Medicaid Other

## 2020-11-06 ENCOUNTER — Other Ambulatory Visit: Payer: Self-pay

## 2020-11-06 DIAGNOSIS — R0602 Shortness of breath: Secondary | ICD-10-CM | POA: Insufficient documentation

## 2020-11-06 DIAGNOSIS — Y990 Civilian activity done for income or pay: Secondary | ICD-10-CM | POA: Insufficient documentation

## 2020-11-06 DIAGNOSIS — S299XXA Unspecified injury of thorax, initial encounter: Secondary | ICD-10-CM | POA: Diagnosis present

## 2020-11-06 DIAGNOSIS — W208XXA Other cause of strike by thrown, projected or falling object, initial encounter: Secondary | ICD-10-CM | POA: Diagnosis not present

## 2020-11-06 DIAGNOSIS — S20212A Contusion of left front wall of thorax, initial encounter: Secondary | ICD-10-CM | POA: Insufficient documentation

## 2020-11-06 DIAGNOSIS — Z87891 Personal history of nicotine dependence: Secondary | ICD-10-CM | POA: Insufficient documentation

## 2020-11-06 LAB — I-STAT BETA HCG BLOOD, ED (MC, WL, AP ONLY): I-stat hCG, quantitative: <5 m[IU]/mL (ref <5)

## 2020-11-06 NOTE — ED Triage Notes (Signed)
Shortness of breath x 3 days after getting left lower rib accidentally hit by a box while at work. Difficulty in reaching object, dizziness and lightheadedness when exerting.

## 2020-11-07 LAB — BASIC METABOLIC PANEL
Anion gap: 9 (ref 5–15)
BUN: 11 mg/dL (ref 6–20)
CO2: 20 mmol/L — ABNORMAL LOW (ref 22–32)
Calcium: 8.8 mg/dL — ABNORMAL LOW (ref 8.9–10.3)
Chloride: 109 mmol/L (ref 98–111)
Creatinine, Ser: 0.72 mg/dL (ref 0.44–1.00)
GFR, Estimated: >60 mL/min (ref >60)
Glucose, Bld: 93 mg/dL (ref 70–99)
Potassium: 3.5 mmol/L (ref 3.5–5.1)
Sodium: 138 mmol/L (ref 135–145)

## 2020-11-07 LAB — CBC
HCT: 35.2 % — ABNORMAL LOW (ref 36.0–46.0)
Hemoglobin: 11.7 g/dL — ABNORMAL LOW (ref 12.0–15.0)
MCH: 32.2 pg (ref 26.0–34.0)
MCHC: 33.2 g/dL (ref 30.0–36.0)
MCV: 97 fL (ref 80.0–100.0)
Platelets: 290 10*3/uL (ref 150–400)
RBC: 3.63 MIL/uL — ABNORMAL LOW (ref 3.87–5.11)
RDW: 13.5 % (ref 11.5–15.5)
WBC: 6.2 10*3/uL (ref 4.0–10.5)
nRBC: 0 % (ref 0.0–0.2)

## 2020-11-07 LAB — TROPONIN I (HIGH SENSITIVITY)
Troponin I (High Sensitivity): 3 ng/L (ref <18)
Troponin I (High Sensitivity): 3 ng/L (ref <18)

## 2020-11-07 MED ORDER — LIDOCAINE 5 % EX PTCH: 1.0000 | MEDICATED_PATCH | CUTANEOUS | 0 refills | Status: DC

## 2020-11-07 MED ORDER — ACETAMINOPHEN 500 MG PO TABS
1000.0000 mg | ORAL_TABLET | Freq: Once | ORAL | Status: AC
  Administered 2020-11-07: 1000 mg via ORAL
  Filled 2020-11-07: qty 2

## 2020-11-07 MED ORDER — LIDOCAINE 5 % EX PTCH
1.0000 | MEDICATED_PATCH | Freq: Once | CUTANEOUS | Status: DC
  Administered 2020-11-07: 1 via TRANSDERMAL
  Filled 2020-11-07: qty 1

## 2020-11-07 NOTE — Discharge Instructions (Addendum)
If you develop recurrent, continued, or worsening chest pain, shortness of breath, fever, vomiting, abdominal or back pain, or any other new/concerning symptoms then return to the ER for evaluation.  

## 2020-11-07 NOTE — ED Provider Notes (Signed)
[facility] EMERGENCY DEPARTMENT Provider Note   CSN: [id number] Arrival date & time: 11/06/20  2215     History Chief Complaint  Patient presents with   Shortness of Breath    Maureen Torres is a 35 y.o. female.  HPI 35 year old female presents with left-sided chest pain.  Originally started 2 weeks ago when a box at work fell on her in her chest.  She has been having pain ever since.  Seems to be worse over the last couple days.  She has a hard time taking any type of deep breath and gets short of breath easily.  The pain is severe.  It worsens with any type of movement or breathing.  She cannot cough because it hurts. No abdominal pain.  History reviewed. No pertinent past medical history.  Patient Active Problem List   Diagnosis Date Noted   Acute pancreatitis 10/22/2020    Past Surgical History:  Procedure Laterality Date   CHOLECYSTECTOMY     rous and y       OB History   No obstetric history on file.     Family History  Problem Relation Age of Onset   Pancreatitis Neg Hx     Social History   Tobacco Use   Smoking status: Former    Types: Cigarettes   Smokeless tobacco: Never  Vaping Use   Vaping Use: Every day  Substance Use Topics   Alcohol use: Yes    Alcohol/week: 4.0 standard drinks    Types: 4 Glasses of wine per week   Drug use: Never    Home Medications Prior to Admission medications   Medication Sig Start Date End Date Taking? Authorizing Provider  lidocaine (LIDODERM) 5 % Place 1 patch onto the skin daily. Remove & Discard patch within 12 hours or as directed by MD 11/07/20  Yes Pricilla Loveless, MD  acetaminophen (TYLENOL) 500 MG tablet Take 1,000 mg by mouth every 6 (six) hours as needed for mild pain.    [provider]  dicyclomine (BENTYL) 20 MG tablet Take 1 tablet (20 mg total) by mouth 2 (two) times daily. 10/20/20   Khatri, Hina, PA-C  famotidine (PEPCID) 20 MG tablet Take 1 tablet (20 mg total) by mouth 2  (two) times daily. 10/20/20   Khatri, Hina, PA-C  HYDROcodone-acetaminophen (NORCO/VICODIN) 5-325 MG tablet Take 1 tablet by mouth every 6 (six) hours as needed. 10/20/20   Khatri, Hina, PA-C  Multiple Vitamin (MULTIVITAMIN) tablet Take 1 tablet by mouth daily.    [provider]  ondansetron (ZOFRAN ODT) 4 MG disintegrating tablet Dissolve 1 tablet (4 mg total) by mouth every 8 (eight) hours as needed for nausea or vomiting. 10/20/20   Khatri, Hina, PA-C    Allergies    Aspirin, Ibuprofen, and Nsaids  Review of Systems   Review of Systems  Respiratory:  Positive for shortness of breath.   Cardiovascular:  Positive for chest pain.  Gastrointestinal:  Negative for abdominal pain.  All other systems reviewed and are negative.  Physical Exam Updated Vital Signs BP 101/62 (BP Location: Left Arm)   Pulse 61   Temp 98.4 F (36.9 C) (Oral)   Resp 18   Ht 5' (1.524 m)   Wt 84.4 kg   SpO2 100%   BMI 36.34 kg/m   Physical Exam Vitals and nursing note reviewed.  Constitutional:      General: She is not in acute distress.    Appearance: She is well-developed. She is  not ill-appearing or diaphoretic.  HENT:     Head: Normocephalic and atraumatic.     Right Ear: External ear normal.     Left Ear: External ear normal.     Nose: Nose normal.  Eyes:     General:        Right eye: No discharge.        Left eye: No discharge.  Cardiovascular:     Rate and Rhythm: Normal rate and regular rhythm.     Heart sounds: Normal heart sounds.  Pulmonary:     Effort: Pulmonary effort is normal.     Breath sounds: Normal breath sounds.  Chest:       Comments: Tender to palpation inferior to left breast. No bruising. Abdominal:     Palpations: Abdomen is soft.     Tenderness: There is no abdominal tenderness.  Skin:    General: Skin is warm and dry.  Neurological:     Mental Status: She is alert.  Psychiatric:        Mood and Affect: Mood is not anxious.    ED Results / Procedures  / Treatments   Labs (all labs ordered are listed, but only abnormal results are displayed) Labs Reviewed  BASIC METABOLIC PANEL - Abnormal; Notable for the following components:      Result Value   CO2 20 (*)    Calcium 8.8 (*)    All other components within normal limits  CBC - Abnormal; Notable for the following components:   RBC 3.63 (*)    Hemoglobin 11.7 (*)    HCT 35.2 (*)    All other components within normal limits  I-STAT BETA HCG BLOOD, ED ([facility], [facility], [facility] ONLY)  TROPONIN I (HIGH SENSITIVITY)  TROPONIN I (HIGH SENSITIVITY)    EKG EKG Interpretation  Date/Time:  Friday November 06 2020 22:47:14 EDT Ventricular Rate:  80 PR Interval:  160 QRS Duration: 70 QT Interval:  374 QTC Calculation: 431 R Axis:   47 Text Interpretation: Normal sinus rhythm no acute ST/T changes similar to Oct 21 2020 Confirmed by Pricilla Loveless [phone]) on 11/07/2020 5:09:52 AM  Radiology DG Chest 2 View  Result Date: 11/06/2020 CLINICAL DATA:  Status post trauma 2 weeks ago with persistent left-sided rib pain. EXAM: CHEST - 2 VIEW COMPARISON:  None. FINDINGS: Decreased lung volumes are seen which is likely secondary to the degree of patient inspiration. There is no evidence of acute infiltrate, pleural effusion or pneumothorax. Mild elevation of the left hemidiaphragm is seen. Bilateral radiopaque nipple piercings are noted. The heart size and mediastinal contours are within normal limits. Surgical sutures are seen within the left upper quadrant. The visualized skeletal structures are unremarkable. IMPRESSION: 1. Very mild elevation of the left hemidiaphragm. 2. No acute or active cardiopulmonary disease. Electronically Signed   By: Aram Candela M.D.   On: 11/06/2020 23:19    Procedures Procedures   Medications Ordered in ED Medications  acetaminophen (TYLENOL) tablet 1,000 mg (has no administration in time range)  lidocaine (LIDODERM) 5 % 1 patch (has no administration in time range)    ED  Course  I have reviewed the triage vital signs and the nursing notes.  Pertinent labs & imaging results that were available during my care of the patient were reviewed by me and considered in my medical decision making (see chart for details).    MDM Rules/Calculators/A&P  Patient presents with a couple weeks of left-sided lower chest pain after a box hit her.  Chest x-ray has been personally reviewed and is unremarkable.  Labs obtained in triage are negative including troponins negative x2.  Given the traumatic presentation, as well as stable vital signs, I have pretty low suspicion for ACS, PE, dissection.  She could have an occult rib fracture but it seems more likely this is chest wall injury.  She cannot take NSAIDs will recommend Tylenol and will add on topical lidocaine.  Discharged home with return precautions. Final Clinical Impression(s) / ED Diagnoses Final diagnoses:  Contusion of left chest wall, initial encounter    Rx / DC Orders ED Discharge Orders          Ordered    lidocaine (LIDODERM) 5 %  Every 24 hours        11/07/20 0559             Pricilla Loveless, MD 11/07/20 [phone]

## 2020-11-21 ENCOUNTER — Encounter: Payer: Medicaid Other | Admitting: Nurse Practitioner

## 2020-11-21 NOTE — Progress Notes (Signed)
Scheduled appointment for her son but showed up under her chart. Son is 17 and does not have my chart. She will try  to make him one and reschedule.

## 2021-02-15 ENCOUNTER — Emergency Department (HOSPITAL_COMMUNITY)
Admission: EM | Admit: 2021-02-15 | Discharge: 2021-02-15 | Disposition: A | Discharge status: Home / Self Care | Payer: Medicaid Other | Source: Home / Self Care | Attending: Emergency Medicine | Admitting: Emergency Medicine

## 2021-02-15 ENCOUNTER — Emergency Department (HOSPITAL_COMMUNITY): Payer: Medicaid Other

## 2021-02-15 ENCOUNTER — Encounter (HOSPITAL_COMMUNITY): Payer: Self-pay

## 2021-02-15 DIAGNOSIS — R1110 Vomiting, unspecified: Secondary | ICD-10-CM | POA: Insufficient documentation

## 2021-02-15 DIAGNOSIS — Z87891 Personal history of nicotine dependence: Secondary | ICD-10-CM | POA: Insufficient documentation

## 2021-02-15 DIAGNOSIS — R1011 Right upper quadrant pain: Secondary | ICD-10-CM | POA: Insufficient documentation

## 2021-02-15 DIAGNOSIS — N9489 Other specified conditions associated with female genital organs and menstrual cycle: Secondary | ICD-10-CM | POA: Insufficient documentation

## 2021-02-15 DIAGNOSIS — R1013 Epigastric pain: Secondary | ICD-10-CM | POA: Insufficient documentation

## 2021-02-15 DIAGNOSIS — K859 Acute pancreatitis without necrosis or infection, unspecified: Secondary | ICD-10-CM

## 2021-02-15 LAB — CBC
HCT: 39.3 % (ref 36.0–46.0)
Hemoglobin: 12.7 g/dL (ref 12.0–15.0)
MCH: 31.8 pg (ref 26.0–34.0)
MCHC: 32.3 g/dL (ref 30.0–36.0)
MCV: 98.5 fL (ref 80.0–100.0)
Platelets: 263 10*3/uL (ref 150–400)
RBC: 3.99 MIL/uL (ref 3.87–5.11)
RDW: 13.3 % (ref 11.5–15.5)
WBC: 10 10*3/uL (ref 4.0–10.5)
nRBC: 0 % (ref 0.0–0.2)

## 2021-02-15 LAB — URINALYSIS, ROUTINE W REFLEX MICROSCOPIC
Bilirubin Urine: NEGATIVE
Glucose, UA: NEGATIVE mg/dL
Hgb urine dipstick: NEGATIVE
Ketones, ur: NEGATIVE mg/dL
Leukocytes,Ua: NEGATIVE
Nitrite: NEGATIVE
Protein, ur: NEGATIVE mg/dL
Specific Gravity, Urine: 1.005 (ref 1.005–1.030)
pH: 8 (ref 5.0–8.0)

## 2021-02-15 LAB — COMPREHENSIVE METABOLIC PANEL
ALT: 25 U/L (ref 0–44)
AST: 23 U/L (ref 15–41)
Albumin: 3.4 g/dL — ABNORMAL LOW (ref 3.5–5.0)
Alkaline Phosphatase: 119 U/L (ref 38–126)
Anion gap: 5 (ref 5–15)
BUN: 8 mg/dL (ref 6–20)
CO2: 21 mmol/L — ABNORMAL LOW (ref 22–32)
Calcium: 8.6 mg/dL — ABNORMAL LOW (ref 8.9–10.3)
Chloride: 105 mmol/L (ref 98–111)
Creatinine, Ser: 0.48 mg/dL (ref 0.44–1.00)
GFR, Estimated: >60 mL/min (ref >60)
Glucose, Bld: 93 mg/dL (ref 70–99)
Potassium: 3.9 mmol/L (ref 3.5–5.1)
Sodium: 131 mmol/L — ABNORMAL LOW (ref 135–145)
Total Bilirubin: 0.6 mg/dL (ref 0.3–1.2)
Total Protein: 6.9 g/dL (ref 6.5–8.1)

## 2021-02-15 LAB — I-STAT BETA HCG BLOOD, ED (MC, WL, AP ONLY): I-stat hCG, quantitative: <5 m[IU]/mL (ref <5)

## 2021-02-15 LAB — LIPASE, BLOOD: Lipase: 61 U/L — ABNORMAL HIGH (ref 11–51)

## 2021-02-15 LAB — RAPID URINE DRUG SCREEN, HOSP PERFORMED
Amphetamines: NOT DETECTED
Barbiturates: NOT DETECTED
Benzodiazepines: NOT DETECTED
Cocaine: NOT DETECTED
Opiates: NOT DETECTED
Tetrahydrocannabinol: POSITIVE — AB

## 2021-02-15 MED ORDER — PANTOPRAZOLE SODIUM 40 MG IV SOLR
40.0000 mg | Freq: Once | INTRAVENOUS | Status: AC
  Administered 2021-02-15: 40 mg via INTRAVENOUS
  Filled 2021-02-15: qty 40

## 2021-02-15 MED ORDER — OXYCODONE-ACETAMINOPHEN 5-325 MG PO TABS: 1.0000 | ORAL_TABLET | Freq: Four times a day (QID) | ORAL | 0 refills | Status: DC | PRN

## 2021-02-15 MED ORDER — HYDROMORPHONE HCL 1 MG/ML IJ SOLN
1.0000 mg | Freq: Once | INTRAMUSCULAR | Status: AC
  Administered 2021-02-15: 1 mg via INTRAVENOUS
  Filled 2021-02-15: qty 1

## 2021-02-15 MED ORDER — HYDROMORPHONE HCL 1 MG/ML IJ SOLN
1.0000 mg | Freq: Once | INTRAMUSCULAR | Status: AC
Start: 2021-02-15 — End: 2021-02-15
  Administered 2021-02-15: 1 mg via INTRAVENOUS
  Filled 2021-02-15: qty 1

## 2021-02-15 MED ORDER — ONDANSETRON 4 MG PO TBDP
4.0000 mg | ORAL_TABLET | Freq: Once | ORAL | Status: AC
  Administered 2021-02-15: 4 mg via ORAL
  Filled 2021-02-15: qty 1

## 2021-02-15 MED ORDER — IOHEXOL 350 MG/ML SOLN
80.0000 mL | Freq: Once | INTRAVENOUS | Status: AC | PRN
  Administered 2021-02-15: 80 mL via INTRAVENOUS

## 2021-02-15 MED ORDER — PROMETHAZINE HCL 25 MG PO TABS: 25.0000 mg | ORAL_TABLET | Freq: Four times a day (QID) | ORAL | 0 refills | Status: DC | PRN

## 2021-02-15 MED ORDER — SODIUM CHLORIDE 0.9 % IV SOLN
25.0000 mg | Freq: Once | INTRAVENOUS | Status: AC
  Administered 2021-02-15: 25 mg via INTRAVENOUS
  Filled 2021-02-15: qty 25

## 2021-02-15 MED ORDER — SODIUM CHLORIDE (PF) 0.9 % IJ SOLN
INTRAMUSCULAR | Status: AC
  Administered 2021-02-15: 1 mL
  Filled 2021-02-15: qty 50

## 2021-02-15 MED ORDER — SODIUM CHLORIDE 0.9 % IV BOLUS
1000.0000 mL | Freq: Once | INTRAVENOUS | Status: AC
  Administered 2021-02-15: 1000 mL via INTRAVENOUS

## 2021-02-15 NOTE — ED Notes (Signed)
Pt NAD, a/ox4. Pt verbalizes understanding of all DC and f/u instructions. All questions answered. Pt walks with steady gait to lobby at DC.  ? ?

## 2021-02-15 NOTE — Discharge Instructions (Signed)
Take tylenol for pain   Take percocet for severe pain   Take phenergan for nausea   Stay hydrated   See GI for follow up   Return to ER if you have worse abdominal pain, vomiting, fever, dehydration

## 2021-02-15 NOTE — ED Provider Notes (Signed)
Emergency Medicine Provider Triage Evaluation Note  Maureen Torres , a 35 y.o. female  was evaluated in triage.  Pt complains of gradual onset, constant, burning, right flank/RUQ/epigastric area since yesterday. Also complains of nausea and NBNB emesis. Has not had a BM in 2-3 days which is atypical for her. Reports recently  being placed on prednisone, doxycycline, and tessalon perles for pneumonia. Has 3-4 days left with abx. No sick contacts. Hx of roux-en-y, cholecystectomy, c sections.  Review of Systems  Positive: + abd pain, nausea, vomiting, constipation Negative: - diarrhea, fever, urinary symptoms  Physical Exam  BP (!) 147/95   Pulse 65   Temp 98.5 F (36.9 C) (Oral)   Resp 16   LMP 02/01/2021 (Approximate)   SpO2 100%  Gen:   Awake, no distress   Resp:  Normal effort  MSK:   Moves extremities without difficulty  Other:  + RUQ and epigastric TTP  Medical Decision Making  Medically screening exam initiated at 11:30 AM.  Appropriate orders placed.  Maureen Torres was informed that the remainder of the evaluation will be completed by another provider, this initial triage assessment does not replace that evaluation, and the importance of remaining in the ED until their evaluation is complete.     Maureen Rockers, PA-C 02/15/21 1131    Maureen Dibbles, MD 02/17/21 1012

## 2021-02-15 NOTE — ED Provider Notes (Signed)
[facility]-EMERGENCY DEPT Provider Note   CSN: [id number] Arrival date & time: 02/15/21  2130     History Chief Complaint  Patient presents with   Abdominal Pain    Maureen Torres is a 35 y.o. female hx of pancreatitis, here presenting with abdominal pain.  Patient has been having epigastric and right upper quadrant pain since yesterday.  Patient has been having vomiting as well.  Patient states that she was recently diagnosed with pneumonia and spent prednisone and doxycycline and Tessalon Perles.  Patient states that she was diagnosed with pancreatitis in August and was admitted for that.  She states that this is similar to her pancreatitis episode.  Patient drinks alcohol occasionally.  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      History reviewed. No pertinent past medical history.  Patient Active Problem List   Diagnosis Date Noted   Acute pancreatitis 10/22/2020    Past Surgical History:  Procedure Laterality Date   CHOLECYSTECTOMY     rous and y       OB History   No obstetric history on file.     Family History  Problem Relation Age of Onset   Pancreatitis Neg Hx     Social History   Tobacco Use   Smoking status: Former    Types: Cigarettes   Smokeless tobacco: Never  Vaping Use   Vaping Use: Every day  Substance Use Topics   Alcohol use: Yes    Alcohol/week: 4.0 standard drinks    Types: 4 Glasses of wine per week   Drug use: Never    Home Medications Prior to Admission medications   Medication Sig Start Date End Date Taking? Authorizing Provider  acetaminophen (TYLENOL) 500 MG tablet Take 1,000 mg by mouth every 6 (six) hours as needed for mild pain.    [provider]  dicyclomine (BENTYL) 20 MG tablet Take 1 tablet (20 mg total) by mouth 2 (two) times daily. 10/20/20   Khatri, Hina, PA-C  famotidine (PEPCID) 20 MG tablet Take 1 tablet (20 mg total) by  mouth 2 (two) times daily. 10/20/20   Khatri, Hina, PA-C  HYDROcodone-acetaminophen (NORCO/VICODIN) 5-325 MG tablet Take 1 tablet by mouth every 6 (six) hours as needed. 10/20/20   Khatri, Hina, PA-C  lidocaine (LIDODERM) 5 % Place 1 patch onto the skin daily. Remove & Discard patch within 12 hours or as directed by MD 11/07/20   Pricilla Loveless, MD  Multiple Vitamin (MULTIVITAMIN) tablet Take 1 tablet by mouth daily.    [provider]  ondansetron (ZOFRAN ODT) 4 MG disintegrating tablet Dissolve 1 tablet (4 mg total) by mouth every 8 (eight) hours as needed for nausea or vomiting. 10/20/20   Khatri, Hina, PA-C    Allergies    Aspirin, Ibuprofen, and Nsaids  Review of Systems   Review of Systems  Gastrointestinal:  Positive for abdominal pain and vomiting.  All other systems reviewed and are negative.  Physical Exam Updated Vital Signs BP 111/61   Pulse 68   Temp 98.5 F (36.9 C) (Oral)   Resp 11   LMP 02/01/2021 (Approximate)   SpO2 100%   Physical Exam Vitals and nursing note reviewed.  Constitutional:      Comments: Uncomfortable   HENT:     Head: Normocephalic.     Mouth/Throat:     Pharynx: Oropharynx is clear.     Comments: MM dry  Eyes:  Extraocular Movements: Extraocular movements intact.     Pupils: Pupils are equal, round, and reactive to light.  Cardiovascular:     Rate and Rhythm: Normal rate and regular rhythm.  Pulmonary:     Effort: Pulmonary effort is normal.     Breath sounds: Normal breath sounds.  Abdominal:     Comments: + epigastric and RUQ tenderness   Skin:    General: Skin is warm.     Capillary Refill: Capillary refill takes less than 2 seconds.  Neurological:     General: No focal deficit present.     Mental Status: She is alert and oriented to person, place, and time.  Psychiatric:        Mood and Affect: Mood normal.        Behavior: Behavior normal.    ED Results / Procedures / Treatments   Labs (all labs ordered are listed,  but only abnormal results are displayed) Labs Reviewed  LIPASE, BLOOD - Abnormal; Notable for the following components:      Result Value   Lipase 61 (*)    All other components within normal limits  COMPREHENSIVE METABOLIC PANEL - Abnormal; Notable for the following components:   Sodium 131 (*)    CO2 21 (*)    Calcium 8.6 (*)    Albumin 3.4 (*)    All other components within normal limits  URINALYSIS, ROUTINE W REFLEX MICROSCOPIC - Abnormal; Notable for the following components:   Color, Urine STRAW (*)    All other components within normal limits  RAPID URINE DRUG SCREEN, HOSP PERFORMED - Abnormal; Notable for the following components:   Tetrahydrocannabinol POSITIVE (*)    All other components within normal limits  CBC  I-STAT BETA HCG BLOOD, ED ([facility], [facility], [facility] ONLY)    EKG EKG Interpretation  Date/Time:  Monday February 15 2021 15:42:47 EST Ventricular Rate:  53 PR Interval:  145 QRS Duration: 87 QT Interval:  428 QTC Calculation: 402 R Axis:   42 Text Interpretation: Sinus rhythm Atrial premature complex Abnormal R-wave progression, early transition No significant change since last tracing Confirmed by Richardean Canal [phone]) on 02/15/2021 3:50:01 PM  Radiology DG Chest 2 View  Result Date: 02/15/2021 CLINICAL DATA:  Cough EXAM: CHEST - 2 VIEW COMPARISON:  11/06/2020 FINDINGS: The heart size and mediastinal contours are within normal limits. Both lungs are clear. The visualized skeletal structures are unremarkable. IMPRESSION: No active cardiopulmonary disease. Electronically Signed   By: Ernie Avena M.D.   On: 02/15/2021 16:24   CT Abdomen Pelvis W Contrast  Result Date: 02/15/2021 CLINICAL DATA:  Epigastric and right upper quadrant/right flank pain since last night. History of Roux-en-Y gastric bypass. EXAM: CT ABDOMEN AND PELVIS WITH CONTRAST TECHNIQUE: Multidetector CT imaging of the abdomen and pelvis was performed using the standard protocol following bolus  administration of intravenous contrast. CONTRAST:  10mL OMNIPAQUE IOHEXOL 350 MG/ML SOLN COMPARISON:  October 21, 2020 FINDINGS: Lower chest: No acute abnormality. Hepatobiliary: Focal fat versus altered perfusion (veins of [place] along the falciform ligament). No suspicious hepatic lesion. Gallbladder surgically absent. No biliary ductal dilation. Pancreas: Edematous appearance of the pancreatic uncinate process with adjacent peripancreatic fluid. No areas of pancreatic parenchymal hypoenhancement. No pancreatic ductal dilation. Spleen: Within normal limits. Adrenals/Urinary Tract: Bilateral adrenal glands are unremarkable. No hydronephrosis. No solid enhancing renal mass. Urinary bladder is unremarkable for degree of distension. Stomach/Bowel: No enteric contrast was administered. 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 No pathologic dilation of  small or large bowel. The appendix and terminal ileum appear normal. Vascular/Lymphatic: No abdominal aortic aneurysm. No pathologically enlarged abdominal or pelvic lymph nodes. Reproductive: Uterus and bilateral adnexa are unremarkable. Other: No walled off fluid collections.  No pneumoperitoneum. Musculoskeletal: No acute or significant osseous findings. IMPRESSION: 1. Acute interstitial pancreatitis. No pancreatic ductal dilation or walled off fluid collections. 2. 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 No evidence of bowel obstruction. Electronically Signed   By: Maudry Mayhew M.D.   On: 02/15/2021 16:15    Procedures Procedures   Medications Ordered in ED Medications  ondansetron (ZOFRAN-ODT) disintegrating tablet 4 mg (4 mg Oral Given 02/15/21 1131)  sodium chloride 0.9 % bolus 1,000 mL (1,000 mLs Intravenous New Bag/Given 02/15/21 1531)  HYDROmorphone (DILAUDID) injection 1 mg (1 mg Intravenous Given 02/15/21 1532)  promethazine (PHENERGAN) 25 mg in sodium chloride 0.9 % 50 mL IVPB (0 mg Intravenous Stopped 02/15/21 1639)   pantoprazole (PROTONIX) injection 40 mg (40 mg Intravenous Given 02/15/21 1540)  iohexol (OMNIPAQUE) 350 MG/ML injection 80 mL (80 mLs Intravenous Contrast Given 02/15/21 1600)  sodium chloride (PF) 0.9 % injection (1 mL  Given by Other 02/15/21 1641)  HYDROmorphone (DILAUDID) injection 1 mg (1 mg Intravenous Given 02/15/21 1636)    ED Course  I have reviewed the triage vital signs and the nursing notes.  Pertinent labs & imaging results that were available during my care of the patient were reviewed by me and considered in my medical decision making (see chart for details).    MDM Rules/Calculators/A&P                          Maureen Torres is a 35 y.o. female here presenting with abdominal pain and vomiting.  Consider pancreatitis versus gastritis.  Patient has a history of pancreatitis and drinks alcohol occasionally.  Patient already has cholecystectomy.  She does use marijuana so consider cannabis induced hyperemesis.  Plan to get CBC and CMP and CT abdomen pelvis and will hydrate and give antiemetics and reassess.     5:00 PM Patient's labs showed lipase of 61. CT showed pancreatitis with no complications. Given IVF and pain meds and phenergan.  Patient felt better and tolerated p.o.  I think at this point, she is stable for discharge and will discharge home with some Percocet and Phenergan as needed.  We will have her follow-up with GI.  Final Clinical Impression(s) / ED Diagnoses Final diagnoses:  None    Rx / DC Orders ED Discharge Orders     None        Charlynne Pander, MD 02/15/21 1705

## 2021-02-15 NOTE — ED Triage Notes (Signed)
Pt presets with c/o abdominal pain. Pt reports she was seen and diagnosed recently for bronchitis and has been taking antibiotics for this. Pt reports that she has been vomiting since last night.

## 2021-02-15 NOTE — ED Notes (Signed)
Pt moderate distress noted while bent over bed. A/ox4, c/o epigastric, RUQ and right flank pain since last noc 0300. Pt states she has had pancreatitis before and this feels the same but worse pain. 10/10 burning to all areas described. Pt denies dysuria symptoms. +n/v

## 2021-02-15 NOTE — ED Notes (Signed)
Patient is unable to give a urine sample at this time.  

## 2021-02-15 NOTE — ED Notes (Signed)
Patient has a urine culture in the main lab 

## 2021-02-16 ENCOUNTER — Inpatient Hospital Stay (HOSPITAL_COMMUNITY)
Admission: EM | Admit: 2021-02-16 | Discharge: 2021-02-20 | DRG: 439 | Disposition: A | Discharge status: Home / Self Care | Payer: Medicaid Other | Attending: Internal Medicine | Admitting: Internal Medicine

## 2021-02-16 ENCOUNTER — Other Ambulatory Visit: Payer: Self-pay

## 2021-02-16 ENCOUNTER — Encounter (HOSPITAL_COMMUNITY): Payer: Self-pay

## 2021-02-16 DIAGNOSIS — R739 Hyperglycemia, unspecified: Secondary | ICD-10-CM | POA: Diagnosis present

## 2021-02-16 DIAGNOSIS — Z888 Allergy status to other drugs, medicaments and biological substances status: Secondary | ICD-10-CM

## 2021-02-16 DIAGNOSIS — K852 Alcohol induced acute pancreatitis without necrosis or infection: Secondary | ICD-10-CM

## 2021-02-16 DIAGNOSIS — R748 Abnormal levels of other serum enzymes: Secondary | ICD-10-CM | POA: Diagnosis present

## 2021-02-16 DIAGNOSIS — Z87891 Personal history of nicotine dependence: Secondary | ICD-10-CM

## 2021-02-16 DIAGNOSIS — Z20822 Contact with and (suspected) exposure to covid-19: Secondary | ICD-10-CM | POA: Diagnosis present

## 2021-02-16 DIAGNOSIS — E871 Hypo-osmolality and hyponatremia: Secondary | ICD-10-CM | POA: Diagnosis present

## 2021-02-16 DIAGNOSIS — Z9884 Bariatric surgery status: Secondary | ICD-10-CM

## 2021-02-16 DIAGNOSIS — K219 Gastro-esophageal reflux disease without esophagitis: Secondary | ICD-10-CM

## 2021-02-16 DIAGNOSIS — Z8701 Personal history of pneumonia (recurrent): Secondary | ICD-10-CM | POA: Diagnosis not present

## 2021-02-16 DIAGNOSIS — K5900 Constipation, unspecified: Secondary | ICD-10-CM

## 2021-02-16 DIAGNOSIS — Z6838 Body mass index (BMI) 38.0-38.9, adult: Secondary | ICD-10-CM | POA: Diagnosis not present

## 2021-02-16 DIAGNOSIS — Z886 Allergy status to analgesic agent status: Secondary | ICD-10-CM

## 2021-02-16 DIAGNOSIS — Z79899 Other long term (current) drug therapy: Secondary | ICD-10-CM | POA: Diagnosis not present

## 2021-02-16 DIAGNOSIS — E861 Hypovolemia: Secondary | ICD-10-CM | POA: Diagnosis present

## 2021-02-16 DIAGNOSIS — Z8711 Personal history of peptic ulcer disease: Secondary | ICD-10-CM | POA: Diagnosis not present

## 2021-02-16 DIAGNOSIS — K859 Acute pancreatitis without necrosis or infection, unspecified: Secondary | ICD-10-CM | POA: Diagnosis present

## 2021-02-16 DIAGNOSIS — E669 Obesity, unspecified: Secondary | ICD-10-CM | POA: Diagnosis present

## 2021-02-16 DIAGNOSIS — K858 Other acute pancreatitis without necrosis or infection: Principal | ICD-10-CM | POA: Diagnosis present

## 2021-02-16 LAB — CBC WITH DIFFERENTIAL/PLATELET
Abs Immature Granulocytes: 0.05 10*3/uL (ref 0.00–0.07)
Basophils Absolute: 0 10*3/uL (ref 0.0–0.1)
Basophils Relative: 0 %
Eosinophils Absolute: 0.1 10*3/uL (ref 0.0–0.5)
Eosinophils Relative: 0 %
HCT: 40.9 % (ref 36.0–46.0)
Hemoglobin: 13.8 g/dL (ref 12.0–15.0)
Immature Granulocytes: 0 %
Lymphocytes Relative: 17 %
Lymphs Abs: 1.9 10*3/uL (ref 0.7–4.0)
MCH: 32.5 pg (ref 26.0–34.0)
MCHC: 33.7 g/dL (ref 30.0–36.0)
MCV: 96.2 fL (ref 80.0–100.0)
Monocytes Absolute: 0.8 10*3/uL (ref 0.1–1.0)
Monocytes Relative: 7 %
Neutro Abs: 8.6 10*3/uL — ABNORMAL HIGH (ref 1.7–7.7)
Neutrophils Relative %: 76 %
Platelets: 292 10*3/uL (ref 150–400)
RBC: 4.25 MIL/uL (ref 3.87–5.11)
RDW: 13.4 % (ref 11.5–15.5)
WBC: 11.5 10*3/uL — ABNORMAL HIGH (ref 4.0–10.5)
nRBC: 0 % (ref 0.0–0.2)

## 2021-02-16 LAB — COMPREHENSIVE METABOLIC PANEL
ALT: 26 U/L (ref 0–44)
AST: 27 U/L (ref 15–41)
Albumin: 3.8 g/dL (ref 3.5–5.0)
Alkaline Phosphatase: 150 U/L — ABNORMAL HIGH (ref 38–126)
Anion gap: 9 (ref 5–15)
BUN: 10 mg/dL (ref 6–20)
CO2: 23 mmol/L (ref 22–32)
Calcium: 9 mg/dL (ref 8.9–10.3)
Chloride: 102 mmol/L (ref 98–111)
Creatinine, Ser: 0.64 mg/dL (ref 0.44–1.00)
GFR, Estimated: >60 mL/min (ref >60)
Glucose, Bld: 102 mg/dL — ABNORMAL HIGH (ref 70–99)
Potassium: 4.1 mmol/L (ref 3.5–5.1)
Sodium: 134 mmol/L — ABNORMAL LOW (ref 135–145)
Total Bilirubin: 0.8 mg/dL (ref 0.3–1.2)
Total Protein: 7.5 g/dL (ref 6.5–8.1)

## 2021-02-16 LAB — RESP PANEL BY RT-PCR (FLU A&B, COVID) ARPGX2
Influenza A by PCR: NEGATIVE
Influenza B by PCR: NEGATIVE
SARS Coronavirus 2 by RT PCR: NEGATIVE

## 2021-02-16 LAB — LIPASE, BLOOD: Lipase: 356 U/L — ABNORMAL HIGH (ref 11–51)

## 2021-02-16 LAB — HCG, QUANTITATIVE, PREGNANCY: hCG, Beta Chain, Quant, S: 1 m[IU]/mL (ref <5)

## 2021-02-16 MED ORDER — SODIUM CHLORIDE 0.9 % IV BOLUS
1000.0000 mL | Freq: Once | INTRAVENOUS | Status: AC
  Administered 2021-02-16: 1000 mL via INTRAVENOUS

## 2021-02-16 MED ORDER — ACETAMINOPHEN 650 MG RE SUPP: 650.0000 mg | Freq: Four times a day (QID) | RECTAL | Status: DC | PRN

## 2021-02-16 MED ORDER — MORPHINE SULFATE (PF) 4 MG/ML IV SOLN
4.0000 mg | Freq: Once | INTRAVENOUS | Status: AC
  Administered 2021-02-16: 4 mg via INTRAVENOUS

## 2021-02-16 MED ORDER — HYDROMORPHONE HCL 1 MG/ML IJ SOLN
1.0000 mg | INTRAMUSCULAR | Status: DC | PRN
  Administered 2021-02-16 – 2021-02-18 (×12): 1 mg via INTRAVENOUS
  Filled 2021-02-16 (×12): qty 1

## 2021-02-16 MED ORDER — ONDANSETRON HCL 4 MG/2ML IJ SOLN
4.0000 mg | Freq: Once | INTRAMUSCULAR | Status: AC
  Administered 2021-02-16: 4 mg via INTRAVENOUS

## 2021-02-16 MED ORDER — ONDANSETRON HCL 4 MG PO TABS
4.0000 mg | ORAL_TABLET | Freq: Four times a day (QID) | ORAL | Status: DC | PRN
  Administered 2021-02-19: 4 mg via ORAL
  Filled 2021-02-16: qty 1

## 2021-02-16 MED ORDER — LACTATED RINGERS IV SOLN: INTRAVENOUS | Status: DC

## 2021-02-16 MED ORDER — LACTATED RINGERS IV BOLUS
1000.0000 mL | Freq: Once | INTRAVENOUS | Status: AC
  Administered 2021-02-16: 1000 mL via INTRAVENOUS

## 2021-02-16 MED ORDER — ONDANSETRON HCL 4 MG/2ML IJ SOLN
4.0000 mg | Freq: Four times a day (QID) | INTRAMUSCULAR | Status: DC | PRN
  Administered 2021-02-16 – 2021-02-18 (×7): 4 mg via INTRAVENOUS
  Filled 2021-02-16 (×7): qty 2

## 2021-02-16 MED ORDER — ACETAMINOPHEN 325 MG PO TABS
650.0000 mg | ORAL_TABLET | Freq: Four times a day (QID) | ORAL | Status: DC | PRN
  Administered 2021-02-18 – 2021-02-20 (×3): 650 mg via ORAL
  Filled 2021-02-16 (×3): qty 2

## 2021-02-16 MED ORDER — PANTOPRAZOLE SODIUM 40 MG IV SOLR
40.0000 mg | INTRAVENOUS | Status: DC
  Administered 2021-02-16 – 2021-02-18 (×3): 40 mg via INTRAVENOUS
  Filled 2021-02-16 (×3): qty 40

## 2021-02-16 NOTE — ED Provider Notes (Signed)
[facility]-EMERGENCY DEPT Provider Note   CSN: [id number] Arrival date & time: 02/16/21  4132     History Chief Complaint  Patient presents with   Abdominal Pain   Emesis    Maureen Torres is a 35 y.o. female presenting for evaluation of nausea, vomiting, abdominal pain.  Patient states 2 days ago she developed upper abdominal pain.  She has associated nausea and vomiting.  She was seen in the ER yesterday, diagnosed with acute pancreatitis.  She was given IV medications, felt better, and went home.  She states since going home, she has had persistent nausea and vomiting, unable to tolerate any p.o. and unable to take her pain medicine.  She reports pain is worse.  She is now having dry heaves.  No fevers.  History of Roux-en-Y and cholecystectomy, no other abdominal surgeries.  No previous history of pancreatitis.  She denies any alcohol use yesterday.  HPI     History reviewed. No pertinent past medical history.  Patient Active Problem List   Diagnosis Date Noted   Acute pancreatitis 10/22/2020    Past Surgical History:  Procedure Laterality Date   CHOLECYSTECTOMY     rous and y       OB History   No obstetric history on file.     Family History  Problem Relation Age of Onset   Pancreatitis Neg Hx     Social History   Tobacco Use   Smoking status: Former    Types: Cigarettes   Smokeless tobacco: Never  Vaping Use   Vaping Use: Every day  Substance Use Topics   Alcohol use: Yes    Alcohol/week: 4.0 standard drinks    Types: 4 Glasses of wine per week   Drug use: Never    Home Medications Prior to Admission medications   Medication Sig Start Date End Date Taking? Authorizing Provider  acetaminophen (TYLENOL) 500 MG tablet Take 1,000 mg by mouth every 6 (six) hours as needed for mild pain.    [provider]  dicyclomine (BENTYL) 20 MG tablet Take 1 tablet (20 mg total) by mouth 2 (two) times daily. 10/20/20   Khatri, Hina,  PA-C  famotidine (PEPCID) 20 MG tablet Take 1 tablet (20 mg total) by mouth 2 (two) times daily. 10/20/20   Khatri, Hina, PA-C  HYDROcodone-acetaminophen (NORCO/VICODIN) 5-325 MG tablet Take 1 tablet by mouth every 6 (six) hours as needed. 10/20/20   Khatri, Hina, PA-C  lidocaine (LIDODERM) 5 % Place 1 patch onto the skin daily. Remove & Discard patch within 12 hours or as directed by MD 11/07/20   Pricilla Loveless, MD  Multiple Vitamin (MULTIVITAMIN) tablet Take 1 tablet by mouth daily.    [provider]  ondansetron (ZOFRAN ODT) 4 MG disintegrating tablet Dissolve 1 tablet (4 mg total) by mouth every 8 (eight) hours as needed for nausea or vomiting. 10/20/20   Khatri, Hina, PA-C  oxyCODONE-acetaminophen (PERCOCET) 5-325 MG tablet Take 1 tablet by mouth every 6 (six) hours as needed. 02/15/21   Charlynne Pander, MD  promethazine (PHENERGAN) 25 MG tablet Take 1 tablet (25 mg total) by mouth every 6 (six) hours as needed for nausea or vomiting. 02/15/21   Charlynne Pander, MD    Allergies    Aspirin, Ibuprofen, and Nsaids  Review of Systems   Review of Systems  Gastrointestinal:  Positive for abdominal pain, nausea and vomiting.  All other systems reviewed and are negative.  Physical Exam Updated Vital Signs  BP (!) 134/96   Pulse 75   Temp 98.8 F (37.1 C) (Oral)   Resp 18   LMP 02/01/2021 (Approximate)   SpO2 99%   Physical Exam Vitals and nursing note reviewed.  Constitutional:      General: She is not in acute distress.    Appearance: Normal appearance.     Comments: nontoxic  HENT:     Head: Normocephalic and atraumatic.  Eyes:     Conjunctiva/sclera: Conjunctivae normal.     Pupils: Pupils are equal, round, and reactive to light.  Cardiovascular:     Rate and Rhythm: Normal rate and regular rhythm.     Pulses: Normal pulses.  Pulmonary:     Effort: Pulmonary effort is normal. No respiratory distress.     Breath sounds: Normal breath sounds. No wheezing.      Comments: Speaking in full sentences.  Clear lung sounds in all fields. Abdominal:     General: There is no distension.     Palpations: Abdomen is soft. There is no mass.     Tenderness: There is abdominal tenderness. There is no guarding or rebound.     Comments: Ttp of the upper and suprapubic abd. No rigidity or distention  Musculoskeletal:        General: Normal range of motion.     Cervical back: Normal range of motion and neck supple.  Skin:    General: Skin is warm and dry.     Capillary Refill: Capillary refill takes less than 2 seconds.  Neurological:     Mental Status: She is alert and oriented to person, place, and time.  Psychiatric:        Mood and Affect: Mood and affect normal.        Speech: Speech normal.        Behavior: Behavior normal.    ED Results / Procedures / Treatments   Labs (all labs ordered are listed, but only abnormal results are displayed) Labs Reviewed  CBC WITH DIFFERENTIAL/PLATELET - Abnormal; Notable for the following components:      Result Value   WBC 11.5 (*)    Neutro Abs 8.6 (*)    All other components within normal limits  LIPASE, BLOOD - Abnormal; Notable for the following components:   Lipase 356 (*)    All other components within normal limits  COMPREHENSIVE METABOLIC PANEL - Abnormal; Notable for the following components:   Sodium 134 (*)    Glucose, Bld 102 (*)    Alkaline Phosphatase 150 (*)    All other components within normal limits  RESP PANEL BY RT-PCR (FLU A&B, COVID) ARPGX2  HCG, QUANTITATIVE, PREGNANCY    EKG None  Radiology DG Chest 2 View  Result Date: 02/15/2021 CLINICAL DATA:  Cough EXAM: CHEST - 2 VIEW COMPARISON:  11/06/2020 FINDINGS: The heart size and mediastinal contours are within normal limits. Both lungs are clear. The visualized skeletal structures are unremarkable. IMPRESSION: No active cardiopulmonary disease. Electronically Signed   By: Ernie Avena M.D.   On: 02/15/2021 16:24   CT Abdomen  Pelvis W Contrast  Result Date: 02/15/2021 CLINICAL DATA:  Epigastric and right upper quadrant/right flank pain since last night. History of Roux-en-Y gastric bypass. EXAM: CT ABDOMEN AND PELVIS WITH CONTRAST TECHNIQUE: Multidetector CT imaging of the abdomen and pelvis was performed using the standard protocol following bolus administration of intravenous contrast. CONTRAST:  78mL OMNIPAQUE IOHEXOL 350 MG/ML SOLN COMPARISON:  October 21, 2020 FINDINGS: Lower chest: No acute abnormality.  Hepatobiliary: Focal fat versus altered perfusion (veins of [place] along the falciform ligament). No suspicious hepatic lesion. Gallbladder surgically absent. No biliary ductal dilation. Pancreas: Edematous appearance of the pancreatic uncinate process with adjacent peripancreatic fluid. No areas of pancreatic parenchymal hypoenhancement. No pancreatic ductal dilation. Spleen: Within normal limits. Adrenals/Urinary Tract: Bilateral adrenal glands are unremarkable. No hydronephrosis. No solid enhancing renal mass. Urinary bladder is unremarkable for degree of distension. Stomach/Bowel: No enteric contrast was administered. 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 No pathologic dilation of small or large bowel. The appendix and terminal ileum appear normal. Vascular/Lymphatic: No abdominal aortic aneurysm. No pathologically enlarged abdominal or pelvic lymph nodes. Reproductive: Uterus and bilateral adnexa are unremarkable. Other: No walled off fluid collections.  No pneumoperitoneum. Musculoskeletal: No acute or significant osseous findings. IMPRESSION: 1. Acute interstitial pancreatitis. No pancreatic ductal dilation or walled off fluid collections. 2. 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 No evidence of bowel obstruction. Electronically Signed   By: Maudry Mayhew M.D.   On: 02/15/2021 16:15    Procedures Procedures   Medications Ordered in ED Medications  acetaminophen (TYLENOL) tablet 650 mg  (has no administration in time range)    Or  acetaminophen (TYLENOL) suppository 650 mg (has no administration in time range)  ondansetron (ZOFRAN) tablet 4 mg (has no administration in time range)    Or  ondansetron (ZOFRAN) injection 4 mg (has no administration in time range)  HYDROmorphone (DILAUDID) injection 1 mg (has no administration in time range)  pantoprazole (PROTONIX) injection 40 mg (has no administration in time range)  lactated ringers bolus 1,000 mL (has no administration in time range)  lactated ringers infusion (has no administration in time range)  ondansetron (ZOFRAN) injection 4 mg (4 mg Intravenous Given 02/16/21 0700)  morphine 4 MG/ML injection 4 mg (4 mg Intravenous Given 02/16/21 0700)  sodium chloride 0.9 % bolus 1,000 mL (1,000 mLs Intravenous New Bag/Given 02/16/21 0700)    ED Course  I have reviewed the triage vital signs and the nursing notes.  Pertinent labs & imaging results that were available during my care of the patient were reviewed by me and considered in my medical decision making (see chart for details).    MDM Rules/Calculators/A&P                           Patient presenting for evaluation of nausea, vomit, abdominal pain.  On exam, patient appears nontoxic.  She does have tenderness palpation of the abdomen.  Recent diagnosis of pancreatitis.  Labs obtained from triage show elevated lipase from yesterday.  White count not significantly changed.  She is afebrile.  She did have a CT yesterday which showed acute pancreatitis.  As such, will hold off on repeat CT.  She will likely need IV medication and continued observation until symptoms resolve.  Will call for admission.  Discussed with Dr. Robb Matar from [facility], pt to be admitted.   Final Clinical Impression(s) / ED Diagnoses Final diagnoses:  Acute pancreatitis, unspecified complication status, unspecified pancreatitis type    Rx / DC Orders ED Discharge Orders     None         Alveria Apley, PA-C 02/16/21 0731    Gilda Crease, MD 02/17/21 [phone]

## 2021-02-16 NOTE — ED Triage Notes (Signed)
Pt reports with emesis and abdominal pain. Pt states that she was dx with pancreatitis yesterday.

## 2021-02-16 NOTE — H&P (Signed)
History and Physical    Maureen Torres [id number] DOB: 1986/03/04 DOA: 02/16/2021  PCP: Pcp, No  Patient coming from: Home.  I have personally briefly reviewed patient's old medical records in Maureen [facility]  Chief Complaint: Abdominal pain, nausea and vomiting.  HPI: Maureen Torres is a 35 y.o. female with no previous past medical history, recently diagnosed with a pneumonia being treated with doxycycline, prednisone and Tessalon Perles, with past surgical history of cholecystectomy and Roux-en-Y surgery, class I obesity who was diagnosed with an acute pancreatitis yesterday in the emergency department after presenting with abdominal pain, numerous episodes of emesis and inability to tolerate oral intake.  She was discharged home on liquid diet, antiemetics and analgesics by return to the emergency department later yesterday evening due to continuation of symptoms and intensity of pain.  She stated she has vomited at least over 15 times.  She drinks a glass of wine several times a week.  She ate fried seafood on Sunday evening.  She has had some mild flatulence, but no diarrhea, melena or hematochezia.  No flank pain, dysuria, frequency or hematuria.  She has had chills and night sweats, but no measured fever, headache, rhinorrhea or sore throat.  No current dyspnea or wheezing.  She still has some cough.  No chest pain, palpitations, diaphoresis, dizziness, pitting edema of the lower extremities, PND or orthopnea.  No polyuria, polydipsia, polyphagia or blurred vision.  ED Course: Initial vital signs were temperature 98.8 F, pulse 76, respiration 18, BP 145/100 mmHg and O2 sat 100% on room air.  The patient received IV fluids, analgesics and antiemetics in the emergency department.  Lab work: CBC is her white count 11.5, hemoglobin 13.8 g/dL platelets 292.  Chemistry showed sodium 134 mmol/L, glucose 102 mg/dL, alkaline phosphatase of 150 and lipase of 356 units/L.  The rest of the chemistry  results were unremarkable.  Imaging: A two-view portable chest radiograph did not show any active cardiopulmonary pathology.  CT abdomen/pelvis with contrast show acute interstitial pancreatitis.  There was no pancreatic ductal dilatation or walled off fluid collection.  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  No evidence of bowel obstruction.  Please see images and full radiology report for further details.  Review of Systems: As per HPI otherwise all other systems reviewed and are negative.  History reviewed. No pertinent past medical history.  Past Surgical History:  Procedure Laterality Date   CHOLECYSTECTOMY     rous and y     Social History  reports that she has quit smoking. Her smoking use included cigarettes. She has never used smokeless tobacco. She reports current alcohol use of about 4.0 standard drinks per week. She reports that she does not use drugs.  Allergies  Allergen Reactions   Aspirin Other (See Comments)    Stomach ulcer   Ibuprofen Other (See Comments)    Gastric Bypass--advised to avoid   Nsaids Other (See Comments)    Gastric bypass Hx gastric bypass    Family History  Problem Relation Age of Onset   Hypertension Other    Pancreatitis Neg Hx    Prior to Admission medications   Medication Sig Start Date End Date Taking? Authorizing Provider  acetaminophen (TYLENOL) 500 MG tablet Take 1,000 mg by mouth every 6 (six) hours as needed for mild pain.    [provider]  dicyclomine (BENTYL) 20 MG tablet Take 1 tablet (20 mg total) by mouth 2 (two) times daily. 10/20/20   Delia Heady, PA-C  famotidine (PEPCID) 20 MG tablet Take 1 tablet (20 mg total) by mouth 2 (two) times daily. 10/20/20   Khatri, Hina, PA-C  HYDROcodone-acetaminophen (NORCO/VICODIN) 5-325 MG tablet Take 1 tablet by mouth every 6 (six) hours as needed. 10/20/20   Khatri, Hina, PA-C  lidocaine (LIDODERM) 5 % Place 1 patch onto the skin daily. Remove & Discard patch within 12  hours or as directed by MD 11/07/20   Sherwood Gambler, MD  Multiple Vitamin (MULTIVITAMIN) tablet Take 1 tablet by mouth daily.    [provider]  ondansetron (ZOFRAN ODT) 4 MG disintegrating tablet Dissolve 1 tablet (4 mg total) by mouth every 8 (eight) hours as needed for nausea or vomiting. 10/20/20   Khatri, Hina, PA-C  oxyCODONE-acetaminophen (PERCOCET) 5-325 MG tablet Take 1 tablet by mouth every 6 (six) hours as needed. 02/15/21   Drenda Freeze, MD  promethazine (PHENERGAN) 25 MG tablet Take 1 tablet (25 mg total) by mouth every 6 (six) hours as needed for nausea or vomiting. 02/15/21   Drenda Freeze, MD   Physical Exam: Vitals:   02/16/21 0547 02/16/21 0702 02/16/21 0900 02/16/21 1000  BP: (!) 145/100 (!) 134/96 127/71 127/78  Pulse: 76 75  87  Resp: 18 18  18   Temp: 98.8 F (37.1 C)     TempSrc: Oral     SpO2: 100% 99%  99%   Constitutional: NAD, calm, comfortable Eyes: PERRL, lids and conjunctivae normal ENMT: Mucous membranes are dry.  Posterior pharynx clear of any exudate or lesions. Neck: normal, supple, no masses, no thyromegaly Respiratory: clear to auscultation bilaterally, no wheezing, no crackles. Normal respiratory effort. No accessory muscle use.  Cardiovascular: Regular rate and rhythm, no murmurs / rubs / gallops. No extremity edema. 2+ pedal pulses. No carotid bruits.  Abdomen: Obese, no distention.  Bowel sounds active.  Soft, positive epigastric and RUQ tenderness, no guarding or rebound, no masses palpated. No hepatosplenomegaly.  Musculoskeletal: no clubbing / cyanosis. No joint deformity upper and lower extremities. Good ROM, no contractures. Normal muscle tone.  Skin: no rashes, lesions, ulcers on limited dermatological examination. Neurologic: CN 2-12 grossly intact. Sensation intact, DTR normal. Strength 5/5 in all 4.  Psychiatric: Normal judgment and insight. Alert and oriented x 3. Normal mood.   Labs on Admission: I have personally  reviewed following labs and imaging studies  CBC: Recent Labs  Lab 02/15/21 0950 02/16/21 0604  WBC 10.0 11.5*  NEUTROABS  --  8.6*  HGB 12.7 13.8  HCT 39.3 40.9  MCV 98.5 96.2  PLT 263 [id number]    Basic Metabolic Panel: Recent Labs  Lab 02/15/21 0950 02/16/21 0604  NA 131* 134*  K 3.9 4.1  CL 105 102  CO2 21* 23  GLUCOSE 93 102*  BUN 8 10  CREATININE 0.48 0.64  CALCIUM 8.6* 9.0    GFR: CrCl cannot be calculated (Unknown ideal weight.).  Liver Function Tests: Recent Labs  Lab 02/15/21 0950 02/16/21 0604  AST 23 27  ALT 25 26  ALKPHOS 119 150*  BILITOT 0.6 0.8  PROT 6.9 7.5  ALBUMIN 3.4* 3.8   Urine analysis:    Component Value Date/Time   COLORURINE STRAW (A) 02/15/2021 [place] 02/15/2021 1043   LABSPEC 1.005 02/15/2021 1043   PHURINE 8.0 02/15/2021 1043   GLUCOSEU NEGATIVE 02/15/2021 1043   HGBUR NEGATIVE 02/15/2021 1043   [place] 02/15/2021 1043   [place] 02/15/2021 1043   PROTEINUR NEGATIVE 02/15/2021 1043  NITRITE NEGATIVE 02/15/2021 1043   LEUKOCYTESUR NEGATIVE 02/15/2021 1043   Radiological Exams on Admission: DG Chest 2 View  Result Date: 02/15/2021 CLINICAL DATA:  Cough EXAM: CHEST - 2 VIEW COMPARISON:  11/06/2020 FINDINGS: The heart size and mediastinal contours are within normal limits. Both lungs are clear. The visualized skeletal structures are unremarkable. IMPRESSION: No active cardiopulmonary disease. Electronically Signed   By: Ernie Avena M.D.   On: 02/15/2021 16:24   CT Abdomen Pelvis W Contrast  Result Date: 02/15/2021 CLINICAL DATA:  Epigastric and right upper quadrant/right flank pain since last night. History of Roux-en-Y gastric bypass. EXAM: CT ABDOMEN AND PELVIS WITH CONTRAST TECHNIQUE: Multidetector CT imaging of the abdomen and pelvis was performed using the standard protocol following bolus administration of intravenous contrast. CONTRAST:  57mL OMNIPAQUE IOHEXOL 350 MG/ML SOLN  COMPARISON:  October 21, 2020 FINDINGS: Lower chest: No acute abnormality. Hepatobiliary: Focal fat versus altered perfusion (veins of Sappey along the falciform ligament). No suspicious hepatic lesion. Gallbladder surgically absent. No biliary ductal dilation. Pancreas: Edematous appearance of the pancreatic uncinate process with adjacent peripancreatic fluid. No areas of pancreatic parenchymal hypoenhancement. No pancreatic ductal dilation. Spleen: Within normal limits. Adrenals/Urinary Tract: Bilateral adrenal glands are unremarkable. No hydronephrosis. No solid enhancing renal mass. Urinary bladder is unremarkable for degree of distension. Stomach/Bowel: No enteric contrast was administered. 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 No pathologic dilation of small or large bowel. The appendix and terminal ileum appear normal. Vascular/Lymphatic: No abdominal aortic aneurysm. No pathologically enlarged abdominal or pelvic lymph nodes. Reproductive: Uterus and bilateral adnexa are unremarkable. Other: No walled off fluid collections.  No pneumoperitoneum. Musculoskeletal: No acute or significant osseous findings. IMPRESSION: 1. Acute interstitial pancreatitis. No pancreatic ductal dilation or walled off fluid collections. 2. 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 No evidence of bowel obstruction. Electronically Signed   By: Maudry Mayhew M.D.   On: 02/15/2021 16:15    EKG: Independently reviewed.  Vent. rate 53 BPM PR interval 145 ms QRS duration 87 ms QT/QTcB 428/402 ms P-R-T axes 9 42 29 Sinus rhythm Atrial premature complex Abnormal R-wave progression, early transition this document was preparation Tax adviser may contain some unintended transcription errors.  Assessment/Plan Principal Problem:   Acute pancreatitis Admitted to MedSurg. Keep NPO. Continue IV fluids. Antiemetics as needed. Analgesics as needed. Pantoprazole 40 mg IVP  daily. Follow-up CBC, CMP and lipase.  Active Problems:   Hyponatremia Secondary to GI losses. Continue IV fluids. Follow-up sodium level.    Hyperglycemia Check fasting glucose.    Alkaline phosphatase elevation Mild elevation with no other abnormality. Follow-up liver function test in the morning.   DVT prophylaxis: SCDs. Code Status:   Full code. Family Communication:   Disposition Plan:   Patient is from:  Home.  Anticipated DC to:  Home.  Anticipated DC date:  02/18/2021.  Anticipated DC barriers: Clinical status. Consults called:   Admission status:  Inpatient/MedSurg.   Severity of Illness: High severity in the setting of acute pancreatitis.  The patient will be admitted for bowel rest, IV hydration, antiemetics, analgesics and close follow-up.  Bobette Mo MD [facility]  How to contact the [facility] Attending or Consulting provider 7A - 7P or covering provider during after hours 7P -7A, for this patient?   Check the care team in [facility] and look for a) attending/consulting [facility] provider listed and b) the [facility] team listed Log into [email or web address] and use [place]'s universal password to access. If you  do not have the password, please contact the hospital operator. Locate the [facility] provider you are looking for under [facility] and page to a number that you can be directly reached. If you still have difficulty reaching the provider, please page the [facility] (Director on Call) for the Hospitalists listed on amion for assistance.  02/16/2021, 10:41 AM   This document was prepared using Dragon voice recognition software and may contain some unintended transcription errors.

## 2021-02-17 ENCOUNTER — Inpatient Hospital Stay (HOSPITAL_COMMUNITY): Payer: Medicaid Other

## 2021-02-17 DIAGNOSIS — K859 Acute pancreatitis without necrosis or infection, unspecified: Secondary | ICD-10-CM

## 2021-02-17 LAB — CBC
HCT: 35.7 % — ABNORMAL LOW (ref 36.0–46.0)
Hemoglobin: 11.6 g/dL — ABNORMAL LOW (ref 12.0–15.0)
MCH: 32.1 pg (ref 26.0–34.0)
MCHC: 32.5 g/dL (ref 30.0–36.0)
MCV: 98.9 fL (ref 80.0–100.0)
Platelets: 202 10*3/uL (ref 150–400)
RBC: 3.61 MIL/uL — ABNORMAL LOW (ref 3.87–5.11)
RDW: 13.3 % (ref 11.5–15.5)
WBC: 4.4 10*3/uL (ref 4.0–10.5)
nRBC: 0 % (ref 0.0–0.2)

## 2021-02-17 LAB — COMPREHENSIVE METABOLIC PANEL
ALT: 115 U/L — ABNORMAL HIGH (ref 0–44)
AST: 192 U/L — ABNORMAL HIGH (ref 15–41)
Albumin: 2.9 g/dL — ABNORMAL LOW (ref 3.5–5.0)
Alkaline Phosphatase: 227 U/L — ABNORMAL HIGH (ref 38–126)
Anion gap: 5 (ref 5–15)
BUN: 6 mg/dL (ref 6–20)
CO2: 26 mmol/L (ref 22–32)
Calcium: 8.2 mg/dL — ABNORMAL LOW (ref 8.9–10.3)
Chloride: 103 mmol/L (ref 98–111)
Creatinine, Ser: 0.52 mg/dL (ref 0.44–1.00)
GFR, Estimated: >60 mL/min (ref >60)
Glucose, Bld: 86 mg/dL (ref 70–99)
Potassium: 3.5 mmol/L (ref 3.5–5.1)
Sodium: 134 mmol/L — ABNORMAL LOW (ref 135–145)
Total Bilirubin: 0.8 mg/dL (ref 0.3–1.2)
Total Protein: 6 g/dL — ABNORMAL LOW (ref 6.5–8.1)

## 2021-02-17 LAB — TRIGLYCERIDES: Triglycerides: 110 mg/dL (ref <150)

## 2021-02-17 LAB — LIPASE, BLOOD: Lipase: 31 U/L (ref 11–51)

## 2021-02-17 MED ORDER — SODIUM CHLORIDE 0.9 % IV BOLUS
1000.0000 mL | Freq: Once | INTRAVENOUS | Status: AC
  Administered 2021-02-17: 1000 mL via INTRAVENOUS

## 2021-02-17 MED ORDER — GADOBUTROL 1 MMOL/ML IV SOLN
8.0000 mL | Freq: Once | INTRAVENOUS | Status: AC | PRN
  Administered 2021-02-17: 8 mL via INTRAVENOUS

## 2021-02-17 NOTE — Consult Note (Addendum)
Referring Provider: [facility] Primary Care Physician:  Pcp, No Primary Gastroenterologist:  Althia Forts  Reason for Consultation:  Acute Pancreatitis  HPI: Maureen Torres is a 34 y.o. female medical history significant for gastric bypass, cholecystectomy (10/2019), recurrent episodes of pancreatitis, presents for abdominal pain, nausea, and vomiting.  Patient recently diagnosed with pneumonia, currently being treated with doxycycline, prednisone, and tessalon.  Patient recently presented to ED 12/5 with abdominal pain and numerous episodes of emesis and inability to tolerate oral intake.  She was diagnosed with pancreatitis and discharged home on liquid diet, antiemetics, and analgesics.  Patient returned to ED later that day with persistent epigastric abdominal pain, nausea, and vomiting not controlled with supportive medication. History of pancreatitis seen in the ED 08/2018, 11/2018, 10/2020.  Denies family history of pancreatitis.  States she drinks 1 glass of wine 2-3 times per week.  Denies NSAID use.  Denies melena/hematochezia.  Has not had a stool in a few days.  Denies previous history of colonoscopy.  States she just moved here from [place] and has been trying to establish care.  Has appointment with PCP in 2 weeks.  Not been seen by GI previously.  EGD 04/06/2018 done due to abdominal pain with MRCP concerning for possible sealed off duodenal ulcer perforation mimicking acute pancreatitis.  EGD with enteroscopy 04/06/2018 by Dr. Delma Freeze: showed normal esophagus, normal stomach, and unable to reach and intubate to the jejunal anastomosis and distal duodenum.   History reviewed. No pertinent past medical history.  Past Surgical History:  Procedure Laterality Date   CHOLECYSTECTOMY     rous and y      Prior to Admission medications   Medication Sig Start Date End Date Taking? Authorizing Provider  acetaminophen (TYLENOL) 500 MG tablet Take 1,000 mg by mouth every 6 (six) hours as needed for  mild pain.   Yes [provider]  albuterol (VENTOLIN HFA) 108 (90 Base) MCG/ACT inhaler Inhale 2 puffs into the lungs every 6 (six) hours as needed for wheezing. 02/12/21  Yes [provider]  benzonatate (TESSALON) 200 MG capsule Take 200 mg by mouth 3 (three) times daily as needed for cough. 02/09/21  Yes [provider]  doxycycline (MONODOX) 100 MG capsule Take 100 mg by mouth 2 (two) times daily. 10 day supply 02/09/21  Yes [provider]  ibuprofen (ADVIL) 200 MG tablet Take 400 mg by mouth every 6 (six) hours as needed for headache, fever or mild pain.   Yes [provider]  methylPREDNISolone (MEDROL DOSEPAK) 4 MG TBPK tablet Take 4 mg by mouth as directed. 6 day dose pack #21 02/09/21  Yes [provider]  oxyCODONE-acetaminophen (PERCOCET) 5-325 MG tablet Take 1 tablet by mouth every 6 (six) hours as needed. Patient taking differently: Take 1 tablet by mouth every 6 (six) hours as needed for moderate pain. 02/15/21  Yes Drenda Freeze, MD  promethazine (PHENERGAN) 25 MG tablet Take 1 tablet (25 mg total) by mouth every 6 (six) hours as needed for nausea or vomiting. 02/15/21  Yes Drenda Freeze, MD  dicyclomine (BENTYL) 20 MG tablet Take 1 tablet (20 mg total) by mouth 2 (two) times daily. Patient not taking: Reported on 02/16/2021 10/20/20   Delia Heady, PA-C  famotidine (PEPCID) 20 MG tablet Take 1 tablet (20 mg total) by mouth 2 (two) times daily. Patient not taking: Reported on 02/16/2021 10/20/20   Delia Heady, PA-C  HYDROcodone-acetaminophen (NORCO/VICODIN) 5-325 MG tablet Take 1 tablet by mouth every 6 (six)  hours as needed. Patient not taking: Reported on 02/16/2021 10/20/20   Delia Heady, PA-C  lidocaine (LIDODERM) 5 % Place 1 patch onto the skin daily. Remove & Discard patch within 12 hours or as directed by MD Patient not taking: Reported on 02/16/2021 11/07/20   Sherwood Gambler, MD  ondansetron (ZOFRAN ODT) 4 MG  disintegrating tablet Dissolve 1 tablet (4 mg total) by mouth every 8 (eight) hours as needed for nausea or vomiting. Patient not taking: Reported on 02/16/2021 10/20/20   Delia Heady, PA-C    Scheduled Meds:  pantoprazole (PROTONIX) IV  40 mg Intravenous Q24H   Continuous Infusions:  lactated ringers 150 mL/hr at 02/17/21 0915   PRN Meds:.acetaminophen **OR** acetaminophen, HYDROmorphone (DILAUDID) injection, ondansetron **OR** ondansetron (ZOFRAN) IV  Allergies as of 02/16/2021 - Review Complete 02/16/2021  Allergen Reaction Noted   Aspirin Other (See Comments) 11/25/2010   Ibuprofen Other (See Comments) 12/20/2010   Nsaids Other (See Comments) 02/11/2014    Family History  Problem Relation Age of Onset   Hypertension Other    Pancreatitis Neg Hx     Social History   Socioeconomic History   Marital status: Single    Spouse name: Not on file   Number of children: Not on file   Years of education: Not on file   Highest education level: Not on file  Occupational History   Not on file  Tobacco Use   Smoking status: Former    Types: Cigarettes   Smokeless tobacco: Never  Vaping Use   Vaping Use: Every day  Substance and Sexual Activity   Alcohol use: Yes    Alcohol/week: 4.0 standard drinks    Types: 4 Glasses of wine per week   Drug use: Never   Sexual activity: Not on file  Other Topics Concern   Not on file  Social History Narrative   Not on file   Social Determinants of Health   Financial Resource Strain: Not on file  Food Insecurity: Not on file  Transportation Needs: Not on file  Physical Activity: Not on file  Stress: Not on file  Social Connections: Not on file  Intimate Partner Violence: Not on file    Review of Systems: Review of Systems  Constitutional:  Negative for chills and fever.  HENT:  Negative for ear discharge and nosebleeds.   Eyes:  Negative for blurred vision and double vision.  Respiratory:  Negative for cough and hemoptysis.    Cardiovascular:  Negative for chest pain and palpitations.  Gastrointestinal:  Positive for abdominal pain, nausea and vomiting. Negative for blood in stool, constipation, diarrhea, heartburn and melena.  Genitourinary:  Negative for dysuria and urgency.  Musculoskeletal:  Negative for myalgias and neck pain.  Skin:  Negative for itching and rash.  Neurological:  Negative for dizziness and headaches.  Psychiatric/Behavioral:  Negative for depression and suicidal ideas.     Physical Exam:Physical Exam Constitutional:      Appearance: She is well-developed.     Comments: Boyfriend at bedside  HENT:     Head: Normocephalic and atraumatic.     Nose: Nose normal. No congestion.     Mouth/Throat:     Mouth: Mucous membranes are moist.     Pharynx: Oropharynx is clear.  Eyes:     Extraocular Movements: Extraocular movements intact.     Conjunctiva/sclera: Conjunctivae normal.  Cardiovascular:     Rate and Rhythm: Normal rate and regular rhythm.     Heart sounds: No murmur heard. Pulmonary:  Effort: Pulmonary effort is normal. No respiratory distress.  Abdominal:     General: Abdomen is flat. Bowel sounds are normal. There is no distension.     Palpations: Abdomen is soft. There is no mass.     Tenderness: There is abdominal tenderness (Epigastrium). There is no guarding or rebound.     Hernia: No hernia is present.  Musculoskeletal:        General: No swelling. Normal range of motion.  Skin:    General: Skin is warm.     Coloration: Skin is not jaundiced.  Neurological:     General: No focal deficit present.     Mental Status: She is alert and oriented to person, place, and time.  Psychiatric:        Mood and Affect: Mood normal.        Behavior: Behavior normal.        Thought Content: Thought content normal.        Judgment: Judgment normal.    Vital signs: Vitals:   02/17/21 1000 02/17/21 1003  BP: 121/82 121/82  Pulse: 78 78  Resp:  18  Temp:    SpO2: 98% 98%         GI:  Lab Results: Recent Labs    02/15/21 0950 02/16/21 0604 02/17/21 0510  WBC 10.0 11.5* 4.4  HGB 12.7 13.8 11.6*  HCT 39.3 40.9 35.7*  PLT 263 292 202   BMET Recent Labs    02/15/21 0950 02/16/21 0604 02/17/21 0510  NA 131* 134* 134*  K 3.9 4.1 3.5  CL 105 102 103  CO2 21* 23 26  GLUCOSE 93 102* 86  BUN 8 10 6   CREATININE 0.48 0.64 0.52  CALCIUM 8.6* 9.0 8.2*   LFT Recent Labs    02/17/21 0510  PROT 6.0*  ALBUMIN 2.9*  AST 192*  ALT 115*  ALKPHOS 227*  BILITOT 0.8   PT/INR No results for input(s): LABPROT, INR in the last 72 hours.   Studies/Results: DG Chest 2 View  Result Date: 02/15/2021 CLINICAL DATA:  Cough EXAM: CHEST - 2 VIEW COMPARISON:  11/06/2020 FINDINGS: The heart size and mediastinal contours are within normal limits. Both lungs are clear. The visualized skeletal structures are unremarkable. IMPRESSION: No active cardiopulmonary disease. Electronically Signed   By: Elmer Picker M.D.   On: 02/15/2021 16:24   CT Abdomen Pelvis W Contrast  Result Date: 02/15/2021 CLINICAL DATA:  Epigastric and right upper quadrant/right flank pain since last night. History of Roux-en-Y gastric bypass. EXAM: CT ABDOMEN AND PELVIS WITH CONTRAST TECHNIQUE: Multidetector CT imaging of the abdomen and pelvis was performed using the standard protocol following bolus administration of intravenous contrast. CONTRAST:  35m OMNIPAQUE IOHEXOL 350 MG/ML SOLN COMPARISON:  October 21, 2020 FINDINGS: Lower chest: No acute abnormality. Hepatobiliary: Focal fat versus altered perfusion (veins of [place]appey along the falciform ligament). No suspicious hepatic lesion. Gallbladder surgically absent. No biliary ductal dilation. Pancreas: Edematous appearance of the pancreatic uncinate process with adjacent peripancreatic fluid. No areas of pancreatic parenchymal hypoenhancement. No pancreatic ductal dilation. Spleen: Within normal limits. Adrenals/Urinary Tract: Bilateral  adrenal glands are unremarkable. No hydronephrosis. No solid enhancing renal mass. Urinary bladder is unremarkable for degree of distension. Stomach/Bowel: No enteric contrast was administered. 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 No pathologic dilation of small or large bowel. The appendix and terminal ileum appear normal. Vascular/Lymphatic: No abdominal aortic aneurysm. No pathologically enlarged abdominal or pelvic lymph nodes. Reproductive: Uterus and bilateral adnexa are  unremarkable. Other: No walled off fluid collections.  No pneumoperitoneum. Musculoskeletal: No acute or significant osseous findings. IMPRESSION: 1. Acute interstitial pancreatitis. No pancreatic ductal dilation or walled off fluid collections. 2. 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 No evidence of bowel obstruction. Electronically Signed   By: Dahlia Bailiff M.D.   On: 02/15/2021 16:15    Impression: Acute interstitial pancreatitis;  -Lipase 356 -AST 192/ALT 115/alk phos 227 -T bili 0.8 -No leukocytosis - Hgb 11.6 -CT abdomen pelvis with contrast 12/5: Acute interstitial pancreatitis, no pancreatic ductal dilation or walled off fluid collections.  Hyponatremia  Hyperglycemia  Plan: Previous history of cholecystectomy August 2021.  Persistent recurrent pancreatitis since 2020.  Does not use alcohol daily.  During this admission LFTs are elevated.  Possibly concerning for gallstone pancreatitis.  Will consider MRCP for further evaluation for choledocholithiasis.  If positive, possible ERCP for stone extraction. Will obtain labs including IgG 4 for autoimmune pancreatitis and triglycerides. Continue lactated Ringer's 150 mL/hour Continue n.p.o. Continue supportive care including antiemetics and pain management as needed [facility] will follow  LOS: 1 day   Garnette Scheuermann  PA-C 02/17/2021, 10:25 AM  Contact #  [phone]

## 2021-02-17 NOTE — Progress Notes (Signed)
PROGRESS NOTE    Kandra Graven  IOE:[id number] DOB: 1985-06-01 DOA: 02/16/2021 PCP: Pcp, No   Chief Complaint  Patient presents with   Abdominal Pain   Emesis    Brief Narrative:  Patient is 35 year old female history of cholecystectomy August 2021 per patient, Roux-en-Y surgery, class I obesity, recently diagnosed with pneumonia being treated with Doxy, prednisone and Tessalon Perles who had presented to the ED 1 day prior to admission with a diagnosis of acute pancreatitis with abdominal pain, numerous episodes of nausea and emesis inability to tolerate oral intake.  Patient discharged on liquid diet antiemetics and analgesics but returned back to the ED due to continued symptoms of nausea vomiting inability to keep anything down, epigastric right upper quadrant abdominal pain.  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  Patient admitted placed on bowel rest, IV fluid resuscitation, pain management.  Patient noted to have a transaminitis 02/17/2021 from a normal LFTs 02/16/2021.  GI consultation pending for evaluation/concern for gallstone pancreatitis.   Assessment & Plan:   Principal Problem:   Acute pancreatitis Active Problems:   Hyponatremia   Hyperglycemia   Alkaline phosphatase elevation  #1 acute interstitial pancreatitis -Patient with prior history of cholecystectomy August 2021 per patient, presenting with epigastric right upper quadrant abdominal pain, nausea vomiting, noted to have elevated lipase levels, CT abdomen and pelvis concerning for acute pancreatitis without any pancreatic dilatation noted. -Patient still with epigastric and right upper quadrant abdominal pain. -Patient denies any daily alcohol consumption. -LFTs noted to have been dramatically elevated from admission with a AST of 192, ALT of 115, bilirubin at  0.8. -Lipase levels trending down. -Continue bowel rest, ice chips, aggressive IV fluid resuscitation, IV antiemetics, IV pain management. -Due to prior history of cholecystectomy, transaminitis we will consult GI for further evaluation and management due to concern for possible gallstone pancreatitis.??  ERCP. -Supportive care.  2.  Hyponatremia -Likely secondary to hypovolemic hyponatremia secondary to GI losses. -IV fluids. -Supportive care.  3.  Hyperglycemia -Fasting blood glucose of 86 this morning. -Outpatient follow-up.  4.  Alk phosphatase elevation -??  Etiology. -Patient with a transaminitis this morning. -Consult with GI for further evaluation and management.   DVT prophylaxis: SCDs Code Status: Full Family Communication: Updated patient, boyfriend at bedside. Disposition:   Status is: Inpatient  Remains inpatient appropriate because: Severity of illness       Consultants:  Gastroenterology  Procedures:  CT abdomen and pelvis 02/15/2021 Chest x-ray 02/15/2021    Antimicrobials:  None   Subjective: Patient laying in bed.  Still with complaints of epigastric and right upper quadrant abdominal pain with slight improvement since admission.  No chest pain.  No shortness of breath.  No emesis this morning.  Boyfriend Laying on gurney by patient.  Objective: Vitals:   02/17/21 0800 02/17/21 0815 02/17/21 0900 02/17/21 0901  BP: 114/79 114/79 112/87 112/87  Pulse: 68 68 67 67  Resp:  18  17  Temp:      TempSrc:      SpO2: 98% 98% 95% 95%   No intake or output data in the 24 hours ending 02/17/21 1002 There were no vitals filed for this visit.  Examination:  General exam: Appears calm and comfortable  Respiratory system: Clear to auscultation. Respiratory effort normal. Cardiovascular system: S1 & S2 heard, RRR. No JVD, murmurs, rubs, gallops or clicks. No pedal  edema. Gastrointestinal system: Abdomen is nondistended, soft and TTP in epigastrium  and right upper quadrant, positive bowel sounds. No organomegaly or masses felt. Normal bowel sounds heard. Central nervous system: Alert and oriented. No focal neurological deficits. Extremities: Symmetric 5 x 5 power. Skin: No rashes, lesions or ulcers Psychiatry: Judgement and insight appear normal. Mood & affect appropriate.     Data Reviewed: I have personally reviewed following labs and imaging studies  CBC: Recent Labs  Lab 02/15/21 0950 02/16/21 0604 02/17/21 0510  WBC 10.0 11.5* 4.4  NEUTROABS  --  8.6*  --   HGB 12.7 13.8 11.6*  HCT 39.3 40.9 35.7*  MCV 98.5 96.2 98.9  PLT 263 292 165    Basic Metabolic Panel: Recent Labs  Lab 02/15/21 0950 02/16/21 0604 02/17/21 0510  NA 131* 134* 134*  K 3.9 4.1 3.5  CL 105 102 103  CO2 21* 23 26  GLUCOSE 93 102* 86  BUN 8 10 6   CREATININE 0.48 0.64 0.52  CALCIUM 8.6* 9.0 8.2*    GFR: CrCl cannot be calculated (Unknown ideal weight.).  Liver Function Tests: Recent Labs  Lab 02/15/21 0950 02/16/21 0604 02/17/21 0510  AST 23 27 192*  ALT 25 26 115*  ALKPHOS 119 150* 227*  BILITOT 0.6 0.8 0.8  PROT 6.9 7.5 6.0*  ALBUMIN 3.4* 3.8 2.9*    CBG: No results for input(s): GLUCAP in the last 168 hours.   Recent Results (from the past 240 hour(s))  Resp Panel by RT-PCR (Flu A&B, Covid) Nasopharyngeal Swab     Status: None   Collection Time: 02/16/21  7:24 AM   Specimen: Nasopharyngeal Swab; Nasopharyngeal(NP) swabs in vial transport medium  Result Value Ref Range Status   SARS Coronavirus 2 by RT PCR NEGATIVE NEGATIVE Final    Comment: (NOTE) SARS-CoV-2 target nucleic acids are NOT DETECTED.  The SARS-CoV-2 RNA is generally detectable in upper respiratory specimens during the acute phase of infection. The lowest concentration of SARS-CoV-2 viral copies this assay can detect is 138 copies/mL. A negative result does not preclude SARS-Cov-2 infection and should not be used as the sole basis for treatment  or other patient management decisions. A negative result may occur with  improper specimen collection/handling, submission of specimen other than nasopharyngeal swab, presence of viral mutation(s) within the areas targeted by this assay, and inadequate number of viral copies(<138 copies/mL). A negative result must be combined with clinical observations, patient history, and epidemiological information. The expected result is Negative.  Fact Sheet for Patients:  EntrepreneurPulse.com.au  Fact Sheet for Healthcare Providers:  IncredibleEmployment.be  This test is no t yet approved or cleared by the [place] FDA and  has been authorized for detection and/or diagnosis of SARS-CoV-2 by FDA under an Emergency Use Authorization (EUA). This EUA will remain  in effect (meaning this test can be used) for the duration of the COVID-19 declaration under Section 564(b)(1) of the Act, 21 U.S.C.section 360bbb-3(b)(1), unless the authorization is terminated  or revoked sooner.       Influenza A by PCR NEGATIVE NEGATIVE Final   Influenza B by PCR NEGATIVE NEGATIVE Final    Comment: (NOTE) The Xpert Xpress SARS-CoV-2/FLU/RSV plus assay is intended as an aid in the diagnosis of influenza from Nasopharyngeal swab specimens and should not be used as a sole basis for treatment. Nasal washings and aspirates are unacceptable for Xpert Xpress SARS-CoV-2/FLU/RSV testing.  Fact Sheet for Patients: EntrepreneurPulse.com.au  Fact Sheet for Healthcare Providers: IncredibleEmployment.be  This  test is not yet approved or cleared by the [place] and has been authorized for detection and/or diagnosis of SARS-CoV-2 by FDA under an Emergency Use Authorization (EUA). This EUA will remain in effect (meaning this test can be used) for the duration of the COVID-19 declaration under Section 564(b)(1) of the Act, 21 U.S.C. section  360bbb-3(b)(1), unless the authorization is terminated or revoked.  Performed at [facility], [place] [address] [place], [place] [address]          Radiology Studies: DG Chest 2 View  Result Date: 02/15/2021 CLINICAL DATA:  Cough EXAM: CHEST - 2 VIEW COMPARISON:  11/06/2020 FINDINGS: The heart size and mediastinal contours are within normal limits. Both lungs are clear. The visualized skeletal structures are unremarkable. IMPRESSION: No active cardiopulmonary disease. Electronically Signed   By: Elmer Picker M.D.   On: 02/15/2021 16:24   CT Abdomen Pelvis W Contrast  Result Date: 02/15/2021 CLINICAL DATA:  Epigastric and right upper quadrant/right flank pain since last night. History of Roux-en-Y gastric bypass. EXAM: CT ABDOMEN AND PELVIS WITH CONTRAST TECHNIQUE: Multidetector CT imaging of the abdomen and pelvis was performed using the standard protocol following bolus administration of intravenous contrast. CONTRAST:  65m OMNIPAQUE IOHEXOL 350 MG/ML SOLN COMPARISON:  October 21, 2020 FINDINGS: Lower chest: No acute abnormality. Hepatobiliary: Focal fat versus altered perfusion (veins of [place] along the falciform ligament). No suspicious hepatic lesion. Gallbladder surgically absent. No biliary ductal dilation. Pancreas: Edematous appearance of the pancreatic uncinate process with adjacent peripancreatic fluid. No areas of pancreatic parenchymal hypoenhancement. No pancreatic ductal dilation. Spleen: Within normal limits. Adrenals/Urinary Tract: Bilateral adrenal glands are unremarkable. No hydronephrosis. No solid enhancing renal mass. Urinary bladder is unremarkable for degree of distension. Stomach/Bowel: No enteric contrast was administered. 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 No pathologic dilation of small or large bowel. The appendix and terminal ileum appear normal. Vascular/Lymphatic: No abdominal aortic aneurysm. No pathologically enlarged  abdominal or pelvic lymph nodes. Reproductive: Uterus and bilateral adnexa are unremarkable. Other: No walled off fluid collections.  No pneumoperitoneum. Musculoskeletal: No acute or significant osseous findings. IMPRESSION: 1. Acute interstitial pancreatitis. No pancreatic ductal dilation or walled off fluid collections. 2. 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 No evidence of bowel obstruction. Electronically Signed   By: JDahlia BailiffM.D.   On: 02/15/2021 16:15        Scheduled Meds:  pantoprazole (PROTONIX) IV  40 mg Intravenous Q24H   Continuous Infusions:  lactated ringers 150 mL/hr at 02/17/21 0915     LOS: 1 day    Time spent: 40 minutes    DIrine Seal MD [facility]   To contact the attending provider between 7A-7P or the covering provider during after hours 7P-7A, please log into the web site [email or web address] and access using universal [place] password for that web site. If you do not have the password, please call the hospital operator.  02/17/2021, 10:02 AM

## 2021-02-18 DIAGNOSIS — K219 Gastro-esophageal reflux disease without esophagitis: Secondary | ICD-10-CM

## 2021-02-18 DIAGNOSIS — K5900 Constipation, unspecified: Secondary | ICD-10-CM

## 2021-02-18 LAB — COMPREHENSIVE METABOLIC PANEL
ALT: 75 U/L — ABNORMAL HIGH (ref 0–44)
AST: 54 U/L — ABNORMAL HIGH (ref 15–41)
Albumin: 3 g/dL — ABNORMAL LOW (ref 3.5–5.0)
Alkaline Phosphatase: 184 U/L — ABNORMAL HIGH (ref 38–126)
Anion gap: 8 (ref 5–15)
BUN: 5 mg/dL — ABNORMAL LOW (ref 6–20)
CO2: 25 mmol/L (ref 22–32)
Calcium: 8.5 mg/dL — ABNORMAL LOW (ref 8.9–10.3)
Chloride: 105 mmol/L (ref 98–111)
Creatinine, Ser: 0.53 mg/dL (ref 0.44–1.00)
GFR, Estimated: >60 mL/min (ref >60)
Glucose, Bld: 91 mg/dL (ref 70–99)
Potassium: 4 mmol/L (ref 3.5–5.1)
Sodium: 138 mmol/L (ref 135–145)
Total Bilirubin: 0.3 mg/dL (ref 0.3–1.2)
Total Protein: 6.1 g/dL — ABNORMAL LOW (ref 6.5–8.1)

## 2021-02-18 LAB — CBC WITH DIFFERENTIAL/PLATELET
Abs Immature Granulocytes: 0.01 10*3/uL (ref 0.00–0.07)
Basophils Absolute: 0 10*3/uL (ref 0.0–0.1)
Basophils Relative: 0 %
Eosinophils Absolute: 0.1 10*3/uL (ref 0.0–0.5)
Eosinophils Relative: 1 %
HCT: 34.4 % — ABNORMAL LOW (ref 36.0–46.0)
Hemoglobin: 11.1 g/dL — ABNORMAL LOW (ref 12.0–15.0)
Immature Granulocytes: 0 %
Lymphocytes Relative: 40 %
Lymphs Abs: 1.7 10*3/uL (ref 0.7–4.0)
MCH: 32.7 pg (ref 26.0–34.0)
MCHC: 32.3 g/dL (ref 30.0–36.0)
MCV: 101.5 fL — ABNORMAL HIGH (ref 80.0–100.0)
Monocytes Absolute: 0.5 10*3/uL (ref 0.1–1.0)
Monocytes Relative: 11 %
Neutro Abs: 2 10*3/uL (ref 1.7–7.7)
Neutrophils Relative %: 48 %
Platelets: 209 10*3/uL (ref 150–400)
RBC: 3.39 MIL/uL — ABNORMAL LOW (ref 3.87–5.11)
RDW: 13.3 % (ref 11.5–15.5)
WBC: 4.3 10*3/uL (ref 4.0–10.5)
nRBC: 0 % (ref 0.0–0.2)

## 2021-02-18 LAB — MAGNESIUM: Magnesium: 1.9 mg/dL (ref 1.7–2.4)

## 2021-02-18 LAB — LIPASE, BLOOD: Lipase: 27 U/L (ref 11–51)

## 2021-02-18 MED ORDER — HYDROMORPHONE HCL 1 MG/ML IJ SOLN
1.0000 mg | Freq: Three times a day (TID) | INTRAMUSCULAR | Status: DC | PRN
  Administered 2021-02-18 – 2021-02-20 (×6): 1 mg via INTRAVENOUS
  Filled 2021-02-18 (×6): qty 1

## 2021-02-18 MED ORDER — LIDOCAINE VISCOUS HCL 2 % MT SOLN
15.0000 mL | Freq: Once | OROMUCOSAL | Status: AC
  Administered 2021-02-18: 15 mL via ORAL
  Filled 2021-02-18: qty 15

## 2021-02-18 MED ORDER — ALUM & MAG HYDROXIDE-SIMETH 200-200-20 MG/5ML PO SUSP
30.0000 mL | Freq: Once | ORAL | Status: AC
  Administered 2021-02-18: 30 mL via ORAL
  Filled 2021-02-18: qty 30

## 2021-02-18 MED ORDER — SENNOSIDES-DOCUSATE SODIUM 8.6-50 MG PO TABS
1.0000 | ORAL_TABLET | Freq: Two times a day (BID) | ORAL | Status: DC
  Administered 2021-02-18: 1 via ORAL
  Filled 2021-02-18 (×2): qty 1

## 2021-02-18 MED ORDER — PANTOPRAZOLE SODIUM 40 MG IV SOLR
40.0000 mg | Freq: Two times a day (BID) | INTRAVENOUS | Status: DC
  Administered 2021-02-18 – 2021-02-19 (×3): 40 mg via INTRAVENOUS
  Filled 2021-02-18 (×3): qty 40

## 2021-02-18 MED ORDER — SORBITOL 70 % SOLN
30.0000 mL | Status: AC
  Administered 2021-02-18 (×2): 30 mL via ORAL
  Filled 2021-02-18 (×2): qty 30

## 2021-02-18 MED ORDER — SODIUM CHLORIDE 0.9 % IV BOLUS
1000.0000 mL | Freq: Once | INTRAVENOUS | Status: AC
  Administered 2021-02-18: 1000 mL via INTRAVENOUS

## 2021-02-18 NOTE — Progress Notes (Signed)
PROGRESS NOTE    Maureen Torres  KVQ:[id number] DOB: 10/17/1985 DOA: 02/16/2021 PCP: Pcp, No    Chief Complaint  Patient presents with   Abdominal Pain   Emesis    Brief Narrative:  Patient is 35 year old female history of cholecystectomy August 2021 per patient, Roux-en-Y surgery, class I obesity, recently diagnosed with pneumonia being treated with Doxy, prednisone and Tessalon Perles who had presented to the ED 1 day prior to admission with a diagnosis of acute pancreatitis with abdominal pain, numerous episodes of nausea and emesis inability to tolerate oral intake.  Patient discharged on liquid diet antiemetics and analgesics but returned back to the ED due to continued symptoms of nausea vomiting inability to keep anything down, epigastric right upper quadrant abdominal pain.  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  Patient admitted placed on bowel rest, IV fluid resuscitation, pain management.  Patient noted to have a transaminitis 02/17/2021 from a normal LFTs 02/16/2021.  GI consulted for evaluation/concern for gallstone pancreatitis.  Labs ordered per GI and MRCP of the abdomen ordered.   Assessment & Plan:   Principal Problem:   Acute pancreatitis Active Problems:   Hyponatremia   Hyperglycemia   Alkaline phosphatase elevation  #1 acute interstitial pancreatitis -Patient with prior history of cholecystectomy August 2021 per patient, presenting with epigastric right upper quadrant abdominal pain, nausea vomiting, noted to have elevated lipase levels, CT abdomen and pelvis concerning for acute pancreatitis without any pancreatic dilatation noted. -Patient with some clinical improvement with epigastric and right upper quadrant pain.   -Patient denies any daily alcohol consumption.   -LFTs noted to have dramatically risen  02/17/2021 and concern for gallstone pancreatitis.   -LFTs trending down.   -Lipase levels trending down.   -Due to concern for gallstone pancreatitis GI was consulted who recommended MRCP abdomen and ordered labs to rule out autoimmune pancreatitis.   -Labs pending.   -MRCP with acute interstitial pancreatitis involving pancreatic head/uncinate process, no walled off fluid collections or evidence of pancreatic necrosis, no biliary ductal dilatation, no suspicious intraluminal filling defects, changes of prior gastric bypass. -Continue bowel rest, ice chips, aggressive IV fluid resuscitation, IV antiemetics, IV pain management.  -We will give a 1 L normal saline bolus x1 today.  -GI following and appreciate input and recommendations.    2.  Hyponatremia -Likely secondary to hypovolemic hyponatremia secondary to GI losses. -Improving with hydration.   -IV fluids, supportive care.  3.  Hyperglycemia -Fasting blood glucose of 101. -Outpatient follow-up.  4.  Alk phosphatase elevation -??  Etiology. -Patient with a transaminitis which is trending back down.   -GI following.  5.  Constipation -Placed on sorbitol 30 cc p.o. every 2 hours x2 doses. -Senokot-S twice daily.  6.  Probable GERD -Patient with some complaints of epigastric burning sensation.  Patient stated prior history of duodenal ulcer in the past with no active bleeding noted. -Increase Protonix to 40 mg IV every 12 hours. -GI cocktail x1.      DVT prophylaxis: SCDs Code Status: Full Family Communication: Updated patient and boyfriend at bedside. Disposition:   Status is: Inpatient  Remains inpatient appropriate because: Severity of illness       Consultants:  Gastroenterology: Dr. Paulita Fujita 02/17/2021  Procedures:  MRI/MRCP abdomen 02/17/2021 CT abdomen and pelvis 02/15/2021  Chest x-ray 02/15/2021  Antimicrobials: None   Subjective: Sitting up in chair.  Endorses ongoing nausea.  No emesis.  No chest  pain.  No shortness of breath.  States some improvement with abdominal pain.  Patient describes epigastric pain as more burning sensation.  Still with right upper quadrant abdominal pain.  Complain of constipation states no bowel movement in about 3 to 4 days.  Stated overnight had about 1800 cc urine output.  Noted to have 500 cc urine output in urinal hat.  Objective: Vitals:   02/17/21 1558 02/17/21 2008 02/18/21 0002 02/18/21 0436  BP: 116/87 107/69 107/73 102/89  Pulse: 69 76 63 68  Resp: 18 14 14 14  Temp: 98.3 F (36.8 C) 97.9 F (36.6 C) 97.9 F (36.6 C) 97.9 F (36.6 C)  TempSrc: Oral Oral Oral Oral  SpO2: 100% 100% 100% 100%  Weight: 87.7 kg     Height: 4' 11" (1.499 m)       Intake/Output Summary (Last 24 hours) at 02/18/2021 1014 Last data filed at 02/18/2021 0348 Gross per 24 hour  Intake 4788.33 ml  Output --  Net 4788.33 ml   Filed Weights   02/17/21 1558  Weight: 87.7 kg    Examination:  General exam: NAD. Respiratory system: Lungs clear to auscultation bilaterally.  No wheezes, no crackles, no rhonchi.  Fair air movement.  Speaking in full sentences. Cardiovascular system: RRR no murmurs rubs or gallops.  No JVD.  No lower extremity edema. Gastrointestinal system: Abdomen is nondistended, soft and tenderness to palpation right upper quadrant, some decreased tenderness in the epigastric region.  Positive bowel sounds.  No rebound.  No guarding. Central nervous system: Alert and oriented. No focal neurological deficits. Extremities: Symmetric 5 x 5 power. Skin: No rashes, lesions or ulcers Psychiatry: Judgement and insight appear normal. Mood & affect appropriate.     Data Reviewed: I have personally reviewed following labs and imaging studies  CBC: Recent Labs  Lab 02/15/21 0950 02/16/21 0604 02/17/21 0510 02/18/21 0534  WBC 10.0 11.5* 4.4 4.3  NEUTROABS  --  8.6*  --  2.0  HGB 12.7 13.8 11.6* 11.1*  HCT 39.3 40.9 35.7* 34.4*  MCV 98.5 96.2 98.9  101.5*  PLT 263 292 202 209    Basic Metabolic Panel: Recent Labs  Lab 02/15/21 0950 02/16/21 0604 02/17/21 0510 02/18/21 0534  NA 131* 134* 134* 138  K 3.9 4.1 3.5 4.0  CL 105 102 103 105  CO2 21* 23 26 25  GLUCOSE 93 102* 86 91  BUN 8 10 6 5*  CREATININE 0.48 0.64 0.52 0.53  CALCIUM 8.6* 9.0 8.2* 8.5*  MG  --   --   --  1.9    GFR: Estimated Creatinine Clearance: 93.6 mL/min (by C-G formula based on SCr of 0.53 mg/dL).  Liver Function Tests: Recent Labs  Lab 02/15/21 0950 02/16/21 0604 02/17/21 0510 02/18/21 0534  AST 23 27 192* 54*  ALT 25 26 115* 75*  ALKPHOS 119 150* 227* 184*  BILITOT 0.6 0.8 0.8 0.3  PROT 6.9 7.5 6.0* 6.1*  ALBUMIN 3.4* 3.8 2.9* 3.0*    CBG: No results for input(s): GLUCAP in the last 168 hours.   Recent Results (from the past 240 hour(s))  Resp Panel by RT-PCR (Flu A&B, Covid) Nasopharyngeal Swab     Status: None   Collection Time: 02/16/21  7:24 AM   Specimen: Nasopharyngeal Swab; Nasopharyngeal(NP) swabs in vial transport medium  Result Value Ref Range Status   SARS Coronavirus 2 by RT PCR NEGATIVE NEGATIVE Final      Comment: (NOTE) SARS-CoV-2 target nucleic acids are NOT DETECTED.  The SARS-CoV-2 RNA is generally detectable in upper respiratory specimens during the acute phase of infection. The lowest concentration of SARS-CoV-2 viral copies this assay can detect is 138 copies/mL. A negative result does not preclude SARS-Cov-2 infection and should not be used as the sole basis for treatment or other patient management decisions. A negative result may occur with  improper specimen collection/handling, submission of specimen other than nasopharyngeal swab, presence of viral mutation(s) within the areas targeted by this assay, and inadequate number of viral copies(<138 copies/mL). A negative result must be combined with clinical observations, patient history, and epidemiological information. The expected result is  Negative.  Fact Sheet for Patients:  https://www.fda.gov/media/152166/download  Fact Sheet for Healthcare Providers:  https://www.fda.gov/media/152162/download  This test is no t yet approved or cleared by the [place] FDA and  has been authorized for detection and/or diagnosis of SARS-CoV-2 by FDA under an Emergency Use Authorization (EUA). This EUA will remain  in effect (meaning this test can be used) for the duration of the COVID-19 declaration under Section 564(b)(1) of the Act, 21 U.S.C.section 360bbb-3(b)(1), unless the authorization is terminated  or revoked sooner.       Influenza A by PCR NEGATIVE NEGATIVE Final   Influenza B by PCR NEGATIVE NEGATIVE Final    Comment: (NOTE) The Xpert Xpress SARS-CoV-2/FLU/RSV plus assay is intended as an aid in the diagnosis of influenza from Nasopharyngeal swab specimens and should not be used as a sole basis for treatment. Nasal washings and aspirates are unacceptable for Xpert Xpress SARS-CoV-2/FLU/RSV testing.  Fact Sheet for Patients: https://www.fda.gov/media/152166/download  Fact Sheet for Healthcare Providers: https://www.fda.gov/media/152162/download  This test is not yet approved or cleared by the [place] FDA and has been authorized for detection and/or diagnosis of SARS-CoV-2 by FDA under an Emergency Use Authorization (EUA). This EUA will remain in effect (meaning this test can be used) for the duration of the COVID-19 declaration under Section 564(b)(1) of the Act, 21 U.S.C. section 360bbb-3(b)(1), unless the authorization is terminated or revoked.  Performed at [facility], [address] [place], [place] [address]          Radiology Studies: MR 3D Recon At Scanner  Result Date: 02/17/2021 CLINICAL DATA:  Pancreatitis. EXAM: MRI ABDOMEN WITHOUT AND WITH CONTRAST (INCLUDING MRCP) TECHNIQUE: Multiplanar multisequence MR imaging of the abdomen was performed both before and  after the administration of intravenous contrast. Heavily T2-weighted images of the biliary and pancreatic ducts were obtained, and three-dimensional MRCP images were rendered by post processing. CONTRAST:  8mL GADAVIST GADOBUTROL 1 MMOL/ML IV SOLN COMPARISON:  CT February 15, 2021 FINDINGS: Lower chest: No acute abnormality. Hepatobiliary: No hepatic steatosis. No suspicious hepatic lesion. Gallbladder surgically absent. No biliary ductal dilation, the common bile duct measures 4 mm. No suspicious intraluminal filling defect. Pancreas: Decreased intrinsic T1 signal in the pancreatic head/uncinate process with hazy T2 signal about the pancreatic head/uncinate. No areas of pancreatic parenchymal hypoenhancement no walled off fluid collections. No pancreatic ductal dilation. No pancreatic divisum. Spleen:  Within normal limits. Adrenals/Urinary Tract: The bilateral adrenal glands are unremarkable. No hydronephrosis. No solid enhancing renal mass. Stomach/Bowel: Mild reactive inflammation of the adjacent duodenal loop. No evidence of bowel obstruction. Changes of prior Roux-en-Y gastric bypass. Vascular/Lymphatic: No abdominal aortic aneurysm. The portal, splenic and superior mesenteric veins appear patent. No pathologically enlarged abdominal or pelvic lymph nodes. Other:  No walled off fluid collections. Musculoskeletal: No suspicious bone   lesions identified. IMPRESSION: 1. Acute interstitial pancreatitis involving the pancreatic head/uncinate process. No walled off fluid collections or evidence of pancreatic necrosis. 2. No biliary ductal dilation. No suspicious intraluminal filling defect. 3. Changes of prior gastric bypass. Electronically Signed   By: Jeffrey  Waltz M.D.   On: 02/17/2021 15:14   MR ABDOMEN MRCP W WO CONTAST  Result Date: 02/17/2021 CLINICAL DATA:  Pancreatitis. EXAM: MRI ABDOMEN WITHOUT AND WITH CONTRAST (INCLUDING MRCP) TECHNIQUE: Multiplanar multisequence MR imaging of the abdomen was  performed both before and after the administration of intravenous contrast. Heavily T2-weighted images of the biliary and pancreatic ducts were obtained, and three-dimensional MRCP images were rendered by post processing. CONTRAST:  8mL GADAVIST GADOBUTROL 1 MMOL/ML IV SOLN COMPARISON:  CT February 15, 2021 FINDINGS: Lower chest: No acute abnormality. Hepatobiliary: No hepatic steatosis. No suspicious hepatic lesion. Gallbladder surgically absent. No biliary ductal dilation, the common bile duct measures 4 mm. No suspicious intraluminal filling defect. Pancreas: Decreased intrinsic T1 signal in the pancreatic head/uncinate process with hazy T2 signal about the pancreatic head/uncinate. No areas of pancreatic parenchymal hypoenhancement no walled off fluid collections. No pancreatic ductal dilation. No pancreatic divisum. Spleen:  Within normal limits. Adrenals/Urinary Tract: The bilateral adrenal glands are unremarkable. No hydronephrosis. No solid enhancing renal mass. Stomach/Bowel: Mild reactive inflammation of the adjacent duodenal loop. No evidence of bowel obstruction. Changes of prior Roux-en-Y gastric bypass. Vascular/Lymphatic: No abdominal aortic aneurysm. The portal, splenic and superior mesenteric veins appear patent. No pathologically enlarged abdominal or pelvic lymph nodes. Other:  No walled off fluid collections. Musculoskeletal: No suspicious bone lesions identified. IMPRESSION: 1. Acute interstitial pancreatitis involving the pancreatic head/uncinate process. No walled off fluid collections or evidence of pancreatic necrosis. 2. No biliary ductal dilation. No suspicious intraluminal filling defect. 3. Changes of prior gastric bypass. Electronically Signed   By: Jeffrey  Waltz M.D.   On: 02/17/2021 15:14        Scheduled Meds:  pantoprazole (PROTONIX) IV  40 mg Intravenous Q24H   Continuous Infusions:  lactated ringers 150 mL/hr at 02/18/21 0204     LOS: 2 days    Time spent: 40  minutes     , MD [facility]   To contact the attending provider between 7A-7P or the covering provider during after hours 7P-7A, please log into the web site [email or web address] and access using universal [place] password for that web site. If you do not have the password, please call the hospital operator.  02/18/2021, 10:14 AM    

## 2021-02-18 NOTE — Progress Notes (Signed)
[facility] Progress Note  Maureen Torres 35 y.o. 28-Dec-1985  CC:  Acute Pancreatitis   Subjective: Patient states she is still having epigastric pain, thought slightly improved from yesterday. Denies nausea/vomiting. Denies melena/hematochezia  ROS : Review of Systems  Gastrointestinal:  Positive for abdominal pain. Negative for blood in stool, constipation, diarrhea, heartburn, melena, nausea and vomiting.  Genitourinary:  Negative for dysuria and urgency.     Objective: Vital signs in last 24 hours: Vitals:   02/18/21 0002 02/18/21 0436  BP: 107/73 102/89  Pulse: 63 68  Resp: 14 14  Temp: 97.9 F (36.6 C) 97.9 F (36.6 C)  SpO2: 100% 100%    Physical Exam:  General:  Alert, cooperative, no distress, appears stated age  Head:  Normocephalic, without obvious abnormality, atraumatic  Eyes:  Anicteric sclera, EOM's intact  Lungs:   Clear to auscultation bilaterally, respirations unlabored  Heart:  Regular rate and rhythm, S1, S2 normal  Abdomen:   Soft, mild tenderness to palpation in epigastrium bowel sounds active all four quadrants,  no masses,   Extremities: Extremities normal, atraumatic, no  edema  Pulses: 2+ and symmetric    Lab Results: Recent Labs    02/17/21 0510 02/18/21 0534  NA 134* 138  K 3.5 4.0  CL 103 105  CO2 26 25  GLUCOSE 86 91  BUN 6 5*  CREATININE 0.52 0.53  CALCIUM 8.2* 8.5*  MG  --  1.9   Recent Labs    02/17/21 0510 02/18/21 0534  AST 192* 54*  ALT 115* 75*  ALKPHOS 227* 184*  BILITOT 0.8 0.3  PROT 6.0* 6.1*  ALBUMIN 2.9* 3.0*   Recent Labs    02/16/21 0604 02/17/21 0510  WBC 11.5* 4.4  NEUTROABS 8.6*  --   HGB 13.8 11.6*  HCT 40.9 35.7*  MCV 96.2 98.9  PLT 292 202   No results for input(s): LABPROT, INR in the last 72 hours.    Assessment Acute interstitial pancreatitis;  -Lipase 356 -AST 54 /ALT 75/alk phos 184 trending down -T bili 0.3 -No leukocytosis - Triglycerides normal - Hgb 11.1 - IgG4  and ANA pending -CT abdomen pelvis with contrast 12/5: Acute interstitial pancreatitis, no pancreatic ductal dilation or walled off fluid collections. - MRCP 12/7: acute interstitial pancreatitis involving head/uncinate process. No fluid collections, no biliary ductal dilation   Hyponatremia   Hyperglycemia   Plan: Patient appears to be improving clinically and LFTs are trending down. MRCP negative for choledocholithiasis. IgG4 and ANA still pending. Suspect etiology of pancreatitis could possibly be alcohol induced vs autoimmune pancreatitis. if pancreatitis occurs in the total absence of alcohol in the future will be less suspicious for alcohol as etiology Continue IVF and supportive care Continue to trend LFTs to normalization Await IgG4 results [facility] will follow   Garnette Scheuermann PA-C 02/18/2021, 9:53 AM  Contact #  [phone]

## 2021-02-19 LAB — COMPREHENSIVE METABOLIC PANEL
ALT: 56 U/L — ABNORMAL HIGH (ref 0–44)
AST: 39 U/L (ref 15–41)
Albumin: 2.8 g/dL — ABNORMAL LOW (ref 3.5–5.0)
Alkaline Phosphatase: 155 U/L — ABNORMAL HIGH (ref 38–126)
Anion gap: 5 (ref 5–15)
BUN: <5 mg/dL — ABNORMAL LOW (ref 6–20)
CO2: 26 mmol/L (ref 22–32)
Calcium: 8.2 mg/dL — ABNORMAL LOW (ref 8.9–10.3)
Chloride: 103 mmol/L (ref 98–111)
Creatinine, Ser: 0.52 mg/dL (ref 0.44–1.00)
GFR, Estimated: >60 mL/min (ref >60)
Glucose, Bld: 95 mg/dL (ref 70–99)
Potassium: 3.5 mmol/L (ref 3.5–5.1)
Sodium: 134 mmol/L — ABNORMAL LOW (ref 135–145)
Total Bilirubin: 0.2 mg/dL — ABNORMAL LOW (ref 0.3–1.2)
Total Protein: 5.5 g/dL — ABNORMAL LOW (ref 6.5–8.1)

## 2021-02-19 LAB — ENA+DNA/DS+ANTICH+CENTRO+JO...
Anti JO-1: <0.2 AI (ref 0.0–0.9)
Centromere Ab Screen: <0.2 AI (ref 0.0–0.9)
Chromatin Ab SerPl-aCnc: <0.2 AI (ref 0.0–0.9)
ENA SM Ab Ser-aCnc: <0.2 AI (ref 0.0–0.9)
Ribonucleic Protein: 2.1 AI — ABNORMAL HIGH (ref 0.0–0.9)
SSA (Ro) (ENA) Antibody, IgG: <0.2 AI (ref 0.0–0.9)
SSB (La) (ENA) Antibody, IgG: <0.2 AI (ref 0.0–0.9)
Scleroderma (Scl-70) (ENA) Antibody, IgG: <0.2 AI (ref 0.0–0.9)
ds DNA Ab: 1 IU/mL (ref 0–9)

## 2021-02-19 LAB — LIPASE, BLOOD: Lipase: 27 U/L (ref 11–51)

## 2021-02-19 LAB — IGG 4: IgG, Subclass 4: 27 mg/dL (ref 2–96)

## 2021-02-19 LAB — ANA W/REFLEX IF POSITIVE: Anti Nuclear Antibody (ANA): POSITIVE — AB

## 2021-02-19 MED ORDER — POTASSIUM CHLORIDE 10 MEQ/100ML IV SOLN
10.0000 meq | INTRAVENOUS | Status: AC
  Administered 2021-02-19 (×2): 10 meq via INTRAVENOUS
  Filled 2021-02-19 (×2): qty 100

## 2021-02-19 MED ORDER — LACTULOSE 10 GM/15ML PO SOLN: 20.0000 g | Freq: Two times a day (BID) | ORAL | Status: DC

## 2021-02-19 NOTE — Progress Notes (Signed)
PROGRESS NOTE    Maureen Torres  IEP:[id number] DOB: 1986-01-23 DOA: 02/16/2021 PCP: Pcp, No    Chief Complaint  Patient presents with   Abdominal Pain   Emesis    Brief Narrative:  Patient is 35 year old female history of cholecystectomy August 2021 per patient, Roux-en-Y surgery, class I obesity, recently diagnosed with pneumonia being treated with Doxy, prednisone and Tessalon Perles who had presented to the ED 1 day prior to admission with a diagnosis of acute pancreatitis with abdominal pain, numerous episodes of nausea and emesis inability to tolerate oral intake.  Patient discharged on liquid diet antiemetics and analgesics but returned back to the ED due to continued symptoms of nausea vomiting inability to keep anything down, epigastric right upper quadrant abdominal pain.  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  Patient admitted placed on bowel rest, IV fluid resuscitation, pain management.  Patient noted to have a transaminitis 02/17/2021 from a normal LFTs 02/16/2021.  GI consulted for evaluation/concern for gallstone pancreatitis.  Labs ordered per GI and MRCP of the abdomen ordered.   Assessment & Plan:   Principal Problem:   Acute pancreatitis Active Problems:   Hyponatremia   Hyperglycemia   Alkaline phosphatase elevation  #1 acute interstitial pancreatitis -Patient with prior history of cholecystectomy August 2021 per patient, presenting with epigastric right upper quadrant abdominal pain, nausea vomiting, noted to have elevated lipase levels, CT abdomen and pelvis concerning for acute pancreatitis without any pancreatic dilatation noted. -Patient with some clinical improvement with epigastric and right upper quadrant pain.   -Patient denies any daily alcohol consumption.   -LFTs noted to have dramatically risen  02/17/2021 and concern for gallstone pancreatitis.   -LFTs trending down with hydration and treatment.   -Lipase levels trending down.   -Due to concern for gallstone pancreatitis GI was consulted who recommended MRCP abdomen and ordered labs to rule out autoimmune pancreatitis.   -Labs pending.   -MRCP with acute interstitial pancreatitis involving pancreatic head/uncinate process, no walled off fluid collections or evidence of pancreatic necrosis, no biliary ductal dilatation, no suspicious intraluminal filling defects, changes of prior gastric bypass. -Patient started on clears and advance to full liquid diet which she is tolerating.   -GI advancing diet. -Clinical improvement.   -Decrease IV fluids to 75 cc an hour.   -GI following and appreciate input and recommendations .  2.  Hyponatremia -Likely secondary to hypovolemic hyponatremia secondary to GI losses. -Improved with hydration.   -Decrease IV fluids to 75 cc an hour. -Supportive care.  3.  Hyperglycemia -Fasting blood glucose on labs this morning at 95.   -Outpatient follow-up.  4.  Alk phosphatase elevation -??  Etiology. -Patient with a transaminitis which is trending back down.   -GI following.  5.  Constipation -Patient placed on sorbitol 30 cc p.o. every 2 hours x2 doses with multiple loose bowel movements.   -Discontinue Senokot-S.   -Follow.  6.  Probable GERD -Patient with some complaints of epigastric burning sensation.  Patient stated prior history of duodenal ulcer in the past with no active bleeding noted. -Epigastric burning sensation improved with GI cocktail and current dose of PPI. -Continue IV PPI twice daily and transition to oral PPI in the next 24 hours. -GI cocktail as needed. -GI following.      DVT prophylaxis: SCDs Code Status: Full Family Communication: Updated patient and boyfriend at bedside.  Disposition:   Status is: Inpatient  Remains inpatient appropriate because: Severity of  illness       Consultants:  Gastroenterology: Dr. Paulita Fujita 02/17/2021  Procedures:  MRI/MRCP abdomen 02/17/2021 CT abdomen and pelvis 02/15/2021  Chest x-ray 02/15/2021  Antimicrobials: None   Subjective: Laying in bed.  Overall feeling better.  States epigastric burning pain has improved.  States improving right upper quadrant pain.  Tolerating broth.  No chest pain.  No shortness of breath.  Stated had multiple loose stools yesterday and feels bloating has improved.  Good urine output.  Boyfriend at bedside.   Objective: Vitals:   02/18/21 0436 02/18/21 1524 02/18/21 2143 02/19/21 0629  BP: 102/89 105/61 114/80 112/84  Pulse: 68 64 69 66  Resp: 14 18 14 14   Temp: 97.9 F (36.6 C) 98.2 F (36.8 C) 97.9 F (36.6 C) 97.9 F (36.6 C)  TempSrc: Oral Oral Oral Oral  SpO2: 100% 97% 100% 100%  Weight:    89.5 kg  Height:        Intake/Output Summary (Last 24 hours) at 02/19/2021 0954 Last data filed at 02/19/2021 0649 Gross per 24 hour  Intake 2171.8 ml  Output 1900 ml  Net 271.8 ml    Filed Weights   02/17/21 1558 02/19/21 0629  Weight: 87.7 kg 89.5 kg    Examination:  General exam: NAD. Respiratory system: CTA B.  No wheezes, no crackles, no rhonchi.  Fair air movement.  Speaking in full sentences.  Cardiovascular system: Regular rate rhythm no murmurs rubs or gallops.  No JVD.  No lower extremity edema.  Gastrointestinal system: Abdomen is nondistended, soft and nontender to palpation in the epigastrium, decreased tenderness to palpation right upper quadrant.  Heating pad noted on right side of abdomen.  No rebound.  No guarding.  Positive bowel sounds.   Central nervous system: Alert and oriented.  Moving extremities spontaneously.  No focal neurological deficits. Extremities: Symmetric 5 x 5 power. Skin: No rashes, lesions or ulcers Psychiatry: Judgement and insight appear normal. Mood & affect appropriate.     Data Reviewed: I have personally reviewed following  labs and imaging studies  CBC: Recent Labs  Lab 02/15/21 0950 02/16/21 0604 02/17/21 0510 02/18/21 0534  WBC 10.0 11.5* 4.4 4.3  NEUTROABS  --  8.6*  --  2.0  HGB 12.7 13.8 11.6* 11.1*  HCT 39.3 40.9 35.7* 34.4*  MCV 98.5 96.2 98.9 101.5*  PLT 263 292 202 209     Basic Metabolic Panel: Recent Labs  Lab 02/15/21 0950 02/16/21 0604 02/17/21 0510 02/18/21 0534 02/19/21 0706  NA 131* 134* 134* 138 134*  K 3.9 4.1 3.5 4.0 3.5  CL 105 102 103 105 103  CO2 21* 23 26 25 26   GLUCOSE 93 102* 86 91 95  BUN 8 10 6  5* <5*  CREATININE 0.48 0.64 0.52 0.53 0.52  CALCIUM 8.6* 9.0 8.2* 8.5* 8.2*  MG  --   --   --  1.9  --      GFR: Estimated Creatinine Clearance: 94.7 mL/min (by C-G formula based on SCr of 0.52 mg/dL).  Liver Function Tests: Recent Labs  Lab 02/15/21 0950 02/16/21 0604 02/17/21 0510 02/18/21 0534 02/19/21 0706  AST 23 27 192* 54* 39  ALT 25 26 115* 75* 56*  ALKPHOS 119 150* 227* 184* 155*  BILITOT 0.6 0.8 0.8 0.3 0.2*  PROT 6.9 7.5 6.0* 6.1* 5.5*  ALBUMIN 3.4* 3.8 2.9* 3.0* 2.8*     CBG: No results  for input(s): GLUCAP in the last 168 hours.   Recent Results (from the past 240 hour(s))  Resp Panel by RT-PCR (Flu A&B, Covid) Nasopharyngeal Swab     Status: None   Collection Time: 02/16/21  7:24 AM   Specimen: Nasopharyngeal Swab; Nasopharyngeal(NP) swabs in vial transport medium  Result Value Ref Range Status   SARS Coronavirus 2 by RT PCR NEGATIVE NEGATIVE Final    Comment: (NOTE) SARS-CoV-2 target nucleic acids are NOT DETECTED.  The SARS-CoV-2 RNA is generally detectable in upper respiratory specimens during the acute phase of infection. The lowest concentration of SARS-CoV-2 viral copies this assay can detect is 138 copies/mL. A negative result does not preclude SARS-Cov-2 infection and should not be used as the sole basis for treatment or other patient management decisions. A negative result may occur with  improper specimen  collection/handling, submission of specimen other than nasopharyngeal swab, presence of viral mutation(s) within the areas targeted by this assay, and inadequate number of viral copies(<138 copies/mL). A negative result must be combined with clinical observations, patient history, and epidemiological information. The expected result is Negative.  Fact Sheet for Patients:  EntrepreneurPulse.com.au  Fact Sheet for Healthcare Providers:  IncredibleEmployment.be  This test is no t yet approved or cleared by the [place] FDA and  has been authorized for detection and/or diagnosis of SARS-CoV-2 by FDA under an Emergency Use Authorization (EUA). This EUA will remain  in effect (meaning this test can be used) for the duration of the COVID-19 declaration under Section 564(b)(1) of the Act, 21 U.S.C.section 360bbb-3(b)(1), unless the authorization is terminated  or revoked sooner.       Influenza A by PCR NEGATIVE NEGATIVE Final   Influenza B by PCR NEGATIVE NEGATIVE Final    Comment: (NOTE) The Xpert Xpress SARS-CoV-2/FLU/RSV plus assay is intended as an aid in the diagnosis of influenza from Nasopharyngeal swab specimens and should not be used as a sole basis for treatment. Nasal washings and aspirates are unacceptable for Xpert Xpress SARS-CoV-2/FLU/RSV testing.  Fact Sheet for Patients: EntrepreneurPulse.com.au  Fact Sheet for Healthcare Providers: IncredibleEmployment.be  This test is not yet approved or cleared by the [place] FDA and has been authorized for detection and/or diagnosis of SARS-CoV-2 by FDA under an Emergency Use Authorization (EUA). This EUA will remain in effect (meaning this test can be used) for the duration of the COVID-19 declaration under Section 564(b)(1) of the Act, 21 U.S.C. section 360bbb-3(b)(1), unless the authorization is terminated or revoked.  Performed at [facility], Home [address], [place], [place] [address]           Radiology Studies: MR 3D Recon At Scanner  Result Date: 02/17/2021 CLINICAL DATA:  Pancreatitis. EXAM: MRI ABDOMEN WITHOUT AND WITH CONTRAST (INCLUDING MRCP) TECHNIQUE: Multiplanar multisequence MR imaging of the abdomen was performed both before and after the administration of intravenous contrast. Heavily T2-weighted images of the biliary and pancreatic ducts were obtained, and three-dimensional MRCP images were rendered by post processing. CONTRAST:  43m GADAVIST GADOBUTROL 1 MMOL/ML IV SOLN COMPARISON:  CT February 15, 2021 FINDINGS: Lower chest: No acute abnormality. Hepatobiliary: No hepatic steatosis. No suspicious hepatic lesion. Gallbladder surgically absent. No biliary ductal dilation, the common bile duct measures 4 mm. No suspicious intraluminal filling defect. Pancreas: Decreased intrinsic T1 signal in the pancreatic head/uncinate process with hazy T2 signal about the pancreatic head/uncinate. No areas of pancreatic parenchymal hypoenhancement no walled off fluid collections. No pancreatic ductal dilation. No pancreatic divisum.  Spleen:  Within normal limits. Adrenals/Urinary Tract: The bilateral adrenal glands are unremarkable. No hydronephrosis. No solid enhancing renal mass. Stomach/Bowel: Mild reactive inflammation of the adjacent duodenal loop. No evidence of bowel obstruction. Changes of prior Roux-en-Y gastric bypass. Vascular/Lymphatic: No abdominal aortic aneurysm. The portal, splenic and superior mesenteric veins appear patent. No pathologically enlarged abdominal or pelvic lymph nodes. Other:  No walled off fluid collections. Musculoskeletal: No suspicious bone lesions identified. IMPRESSION: 1. Acute interstitial pancreatitis involving the pancreatic head/uncinate process. No walled off fluid collections or evidence of pancreatic necrosis. 2. No biliary ductal dilation. No suspicious intraluminal  filling defect. 3. Changes of prior gastric bypass. Electronically Signed   By: Dahlia Bailiff M.D.   On: 02/17/2021 15:14   MR ABDOMEN MRCP W WO CONTAST  Result Date: 02/17/2021 CLINICAL DATA:  Pancreatitis. EXAM: MRI ABDOMEN WITHOUT AND WITH CONTRAST (INCLUDING MRCP) TECHNIQUE: Multiplanar multisequence MR imaging of the abdomen was performed both before and after the administration of intravenous contrast. Heavily T2-weighted images of the biliary and pancreatic ducts were obtained, and three-dimensional MRCP images were rendered by post processing. CONTRAST:  11m GADAVIST GADOBUTROL 1 MMOL/ML IV SOLN COMPARISON:  CT February 15, 2021 FINDINGS: Lower chest: No acute abnormality. Hepatobiliary: No hepatic steatosis. No suspicious hepatic lesion. Gallbladder surgically absent. No biliary ductal dilation, the common bile duct measures 4 mm. No suspicious intraluminal filling defect. Pancreas: Decreased intrinsic T1 signal in the pancreatic head/uncinate process with hazy T2 signal about the pancreatic head/uncinate. No areas of pancreatic parenchymal hypoenhancement no walled off fluid collections. No pancreatic ductal dilation. No pancreatic divisum. Spleen:  Within normal limits. Adrenals/Urinary Tract: The bilateral adrenal glands are unremarkable. No hydronephrosis. No solid enhancing renal mass. Stomach/Bowel: Mild reactive inflammation of the adjacent duodenal loop. No evidence of bowel obstruction. Changes of prior Roux-en-Y gastric bypass. Vascular/Lymphatic: No abdominal aortic aneurysm. The portal, splenic and superior mesenteric veins appear patent. No pathologically enlarged abdominal or pelvic lymph nodes. Other:  No walled off fluid collections. Musculoskeletal: No suspicious bone lesions identified. IMPRESSION: 1. Acute interstitial pancreatitis involving the pancreatic head/uncinate process. No walled off fluid collections or evidence of pancreatic necrosis. 2. No biliary ductal dilation. No  suspicious intraluminal filling defect. 3. Changes of prior gastric bypass. Electronically Signed   By: JDahlia BailiffM.D.   On: 02/17/2021 15:14        Scheduled Meds:  lactulose  20 g Oral BID   pantoprazole (PROTONIX) IV  40 mg Intravenous Q12H   senna-docusate  1 tablet Oral BID   Continuous Infusions:  lactated ringers 125 mL/hr at 02/19/21 0951   potassium chloride 10 mEq (02/19/21 0951)     LOS: 3 days    Time spent: 35 minutes    DIrine Seal MD [facility]   To contact the attending provider between 7A-7P or the covering provider during after hours 7P-7A, please log into the web site [email or web address] and access using universal [place] password for that web site. If you do not have the password, please call the hospital operator.  02/19/2021, 9:54 AM

## 2021-02-19 NOTE — Progress Notes (Signed)
[facility] Progress Note  Maureen Torres 35 y.o. [birth year]  CC:  Acute pancreatitis   Subjective: Patient states her pain is not as well controlled with the pain medication only being 3 times per day. Still having pain. Nausea well controlled with zofran. States she has tolerated clear liquids well, Grits were not tolerated as well. Per hospitalist, she was having reflux yesterday and was given a GI cocktail with relief.  ROS : Review of Systems  Gastrointestinal:  Positive for abdominal pain, heartburn and nausea. Negative for blood in stool, constipation, diarrhea, melena and vomiting.  Genitourinary:  Negative for dysuria and urgency.     Objective: Vital signs in last 24 hours: Vitals:   02/18/21 2143 02/19/21 0629  BP: 114/80 112/84  Pulse: 69 66  Resp: 14 14  Temp: 97.9 F (36.6 C) 97.9 F (36.6 C)  SpO2: 100% 100%    Physical Exam:  General:  Alert, cooperative, no distress, appears stated age  Head:  Normocephalic, without obvious abnormality, atraumatic  Eyes:  Anicteric sclera, EOM's intact  Lungs:   Clear to auscultation bilaterally, respirations unlabored  Heart:  Regular rate and rhythm, S1, S2 normal  Abdomen:   Soft, TTP epigastrum, bowel sounds active all four quadrants,  no masses,   Extremities: Extremities normal, atraumatic, no  edema  Pulses: 2+ and symmetric    Lab Results: Recent Labs    02/18/21 0534 02/19/21 0706  NA 138 134*  K 4.0 3.5  CL 105 103  CO2 25 26  GLUCOSE 91 95  BUN 5* <5*  CREATININE 0.53 0.52  CALCIUM 8.5* 8.2*  MG 1.9  --    Recent Labs    02/18/21 0534 02/19/21 0706  AST 54* 39  ALT 75* 56*  ALKPHOS 184* 155*  BILITOT 0.3 0.2*  PROT 6.1* 5.5*  ALBUMIN 3.0* 2.8*   Recent Labs    02/17/21 0510 02/18/21 0534  WBC 4.4 4.3  NEUTROABS  --  2.0  HGB 11.6* 11.1*  HCT 35.7* 34.4*  MCV 98.9 101.5*  PLT 202 209   No results for input(s): LABPROT, INR in the last 72 hours.    Assessment Acute  interstitial pancreatitis;  -Lipase 356 -AST 39 /ALT 56/alk phos 155 trending down -T bili 0.2 -No leukocytosis - Triglycerides normal - Hgb 11.1 - IgG 4 normal - CT abdomen pelvis with contrast 12/5: Acute interstitial pancreatitis, no pancreatic ductal dilation or walled off fluid collections. - MRCP 12/7: acute interstitial pancreatitis involving head/uncinate process. No fluid collections, no biliary ductal dilation   Hyponatremia   Hyperglycemia  Plan: LFTs are trending down. MRCP negative for choledocholithiasis. IgG 4 negative, ANA still pending so less likely to be autoimmune pancreatitis.  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 Continue IVF and supportive care Continue to trend LFTs to normalization Can continue to advance diet as tolerated. [facility] will follow  Garnette Scheuermann PA-C 02/19/2021, 11:16 AM  Contact #  [phone]

## 2021-02-20 DIAGNOSIS — K5900 Constipation, unspecified: Secondary | ICD-10-CM

## 2021-02-20 DIAGNOSIS — K219 Gastro-esophageal reflux disease without esophagitis: Secondary | ICD-10-CM

## 2021-02-20 LAB — COMPREHENSIVE METABOLIC PANEL
ALT: 42 U/L (ref 0–44)
AST: 23 U/L (ref 15–41)
Albumin: 2.8 g/dL — ABNORMAL LOW (ref 3.5–5.0)
Alkaline Phosphatase: 141 U/L — ABNORMAL HIGH (ref 38–126)
Anion gap: 8 (ref 5–15)
BUN: 7 mg/dL (ref 6–20)
CO2: 27 mmol/L (ref 22–32)
Calcium: 8.2 mg/dL — ABNORMAL LOW (ref 8.9–10.3)
Chloride: 102 mmol/L (ref 98–111)
Creatinine, Ser: 0.65 mg/dL (ref 0.44–1.00)
GFR, Estimated: >60 mL/min (ref >60)
Glucose, Bld: 96 mg/dL (ref 70–99)
Potassium: 3.9 mmol/L (ref 3.5–5.1)
Sodium: 137 mmol/L (ref 135–145)
Total Bilirubin: 0.5 mg/dL (ref 0.3–1.2)
Total Protein: 5.9 g/dL — ABNORMAL LOW (ref 6.5–8.1)

## 2021-02-20 MED ORDER — PANTOPRAZOLE SODIUM 40 MG PO TBEC
40.0000 mg | DELAYED_RELEASE_TABLET | Freq: Two times a day (BID) | ORAL | Status: DC
  Administered 2021-02-20: 40 mg via ORAL
  Filled 2021-02-20: qty 1

## 2021-02-20 MED ORDER — PANTOPRAZOLE SODIUM 40 MG PO TBEC: 40.0000 mg | DELAYED_RELEASE_TABLET | Freq: Two times a day (BID) | ORAL | 1 refills | Status: DC

## 2021-02-20 MED ORDER — ONDANSETRON 4 MG PO TBDP: 4.0000 mg | ORAL_TABLET | Freq: Three times a day (TID) | ORAL | 0 refills | Status: DC | PRN

## 2021-02-20 MED ORDER — TRAMADOL HCL 50 MG PO TABS
50.0000 mg | ORAL_TABLET | Freq: Four times a day (QID) | ORAL | Status: DC | PRN
  Administered 2021-02-20: 50 mg via ORAL
  Filled 2021-02-20: qty 1

## 2021-02-20 NOTE — TOC Transition Note (Signed)
Transition of Care [facility]) - CM/SW Discharge Note   Patient Details  Name: Maureen Torres MRN: [id number] Date of Birth: 1985/06/26  Transition of [facility]) CM/SW Contact:  Darleene Cleaver, LCSW Phone Number: 02/20/2021, 10:45 AM   Clinical Narrative:      Transition of Care (TOC) Screening Note   Patient Details  Name: Maureen Torres Date of Birth: [birth year]   [facility]) CM/SW Contact:    Darleene Cleaver, LCSW Phone Number: 02/20/2021, 10:45 AM    Transition of Care Department [facility]) has reviewed patient and no TOC needs have been identified at this time. We will continue to monitor patient advancement through interdisciplinary progression rounds. If new patient transition needs arise, please place a TOC consult.           Patient Goals and CMS Choice        Discharge Placement                       Discharge Plan and Services                                     Social Determinants of Health (SDOH) Interventions     Readmission Risk Interventions No flowsheet data found.

## 2021-02-20 NOTE — Progress Notes (Signed)
Patient discharged to home with family, discharge instructions reviewed with patient who verbalized understanding. 

## 2021-02-20 NOTE — Discharge Summary (Signed)
Physician Discharge Summary  Maureen Torres PYP:[id number] DOB: Sep 14, 1985 DOA: 02/16/2021  PCP: Pcp, No  Admit date: 02/16/2021 Discharge date: 02/20/2021  Time spent: 55 minutes  Recommendations for Outpatient Follow-up:  Follow-up with Dr. Paulita Fujita, gastroenterology in 3 to 4 weeks. Follow-up with PCP in 2 weeks.  On follow-up patient will need a comprehensive metabolic profile done to follow-up on electrolytes, renal function, LFTs.   Discharge Diagnoses:  Principal Problem:   Acute pancreatitis Active Problems:   Hyponatremia   Hyperglycemia   Alkaline phosphatase elevation   Constipation   Gastroesophageal reflux disease   Discharge Condition: Stable and improved  Diet recommendation: Soft diet  Filed Weights   02/17/21 1558 02/19/21 0629 02/20/21 0417  Weight: 87.7 kg 89.5 kg 85.6 kg    History of present illness:  HPI per Dr. Azzie Almas is a 35 y.o. female with no previous past medical history, recently diagnosed with a pneumonia being treated with doxycycline, prednisone and Tessalon Perles, with past surgical history of cholecystectomy and Roux-en-Y surgery, class I obesity who was diagnosed with an acute pancreatitis yesterday in the emergency department after presenting with abdominal pain, numerous episodes of emesis and inability to tolerate oral intake.  She was discharged home on liquid diet, antiemetics and analgesics by return to the emergency department later yesterday evening due to continuation of symptoms and intensity of pain.  She stated she has vomited at least over 15 times.  She drinks a glass of wine several times a week.  She ate fried seafood on Sunday evening.  She has had some mild flatulence, but no diarrhea, melena or hematochezia.  No flank pain, dysuria, frequency or hematuria.  She has had chills and night sweats, but no measured fever, headache, rhinorrhea or sore throat.  No current dyspnea or wheezing.  She still has some cough.  No  chest pain, palpitations, diaphoresis, dizziness, pitting edema of the lower extremities, PND or orthopnea.  No polyuria, polydipsia, polyphagia or blurred vision.   ED Course: Initial vital signs were temperature 98.8 F, pulse 76, respiration 18, BP 145/100 mmHg and O2 sat 100% on room air.  The patient received IV fluids, analgesics and antiemetics in the emergency department.   Lab work: CBC is her white count 11.5, hemoglobin 13.8 g/dL platelets 292.  Chemistry showed sodium 134 mmol/L, glucose 102 mg/dL, alkaline phosphatase of 150 and lipase of 356 units/L.  The rest of the chemistry results were unremarkable.   Imaging: A two-view portable chest radiograph did not show any active cardiopulmonary pathology.  CT abdomen/pelvis with contrast show acute interstitial pancreatitis.  There was no pancreatic ductal dilatation or walled off fluid collection.  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  No evidence of bowel obstruction.  Please see images and full radiology report for further details.  Hospital Course:  1 acute interstitial pancreatitis -Patient with prior history of cholecystectomy August 2021 per patient, presenting with epigastric right upper quadrant abdominal pain, nausea vomiting, noted to have elevated lipase levels, CT abdomen and pelvis concerning for acute pancreatitis without any pancreatic dilatation noted. -Patient with some clinical improvement with epigastric and right upper quadrant pain.   -Patient denies any daily alcohol consumption.   -LFTs noted to have dramatically risen 02/17/2021 and concern for gallstone pancreatitis.   -LFTs trended down with hydration and treatment.   -Lipase levels trended down.   -Due to concern for gallstone pancreatitis GI was consulted who recommended MRCP abdomen and ordered labs to rule out  autoimmune pancreatitis.   -IgG noted to be negative. -MRCP with acute interstitial pancreatitis involving pancreatic head/uncinate  process, no walled off fluid collections or evidence of pancreatic necrosis, no biliary ductal dilatation, no suspicious intraluminal filling defects, changes of prior gastric bypass. -Patient started on clears and diet advanced to a soft diet which she tolerated.   -Patient improved clinically during the hospitalization.   -Patient was discharged home in stable and improved condition with outpatient follow-up with GI and PCP.   2.  Hyponatremia -Likely secondary to hypovolemic hyponatremia secondary to GI losses. -Resolved with hydration.  3.  Hyperglycemia -Fasting blood glucose on labs was 95.   -Outpatient follow-up.  4.  Alk phosphatase elevation -??  Etiology. -Patient with a transaminitis which trended down.   -Outpatient follow-up.    5.  Constipation -Patient placed on sorbitol 30 cc p.o. every 2 hours x2 doses with multiple loose bowel movements.   -Stool softeners discontinued. -Constipation resolved by day of discharge.  6.  Probable GERD -Patient with some complaints of epigastric burning sensation.  Patient stated prior history of duodenal ulcer in the past with no active bleeding noted. -Epigastric burning sensation improved with GI cocktail and PPI.   -Patient improved clinically.   -Patient will be discharged on PPI twice daily x1 month, then PPI daily thereafter.   -Outpatient follow-up.       Procedures: MRI/MRCP abdomen 02/17/2021 CT abdomen and pelvis 02/15/2021  Chest x-ray 02/15/2021  Consultations: Gastroenterology: Dr. Paulita Fujita 02/17/2021  Discharge Exam: Vitals:   02/19/21 2058 02/20/21 0626  BP: 111/80 110/71  Pulse: 63 76  Resp: 16 17  Temp: 98.3 F (36.8 C) 98.1 F (36.7 C)  SpO2: 99% 97%    General: NAD. Cardiovascular: Regular rate rhythm no murmurs rubs or gallops.  No JVD.  No lower extremity edema. Respiratory: Lungs clear to auscultation bilaterally.  No wheezes, no crackles, no rhonchi.  Normal respiratory effort. GI: Abdomen is  soft, nondistended, decreased tenderness to palpation right upper quadrant, positive bowel sounds.  No rebound.  No guarding.  Discharge Instructions   Discharge Instructions     Diet - low sodium heart healthy   Complete by: As directed    Soft diet   Increase activity slowly   Complete by: As directed       Allergies as of 02/20/2021       Reactions   Aspirin Other (See Comments)   Stomach ulcer   Ibuprofen Other (See Comments)   Gastric Bypass--advised to avoid   Nsaids Other (See Comments)   Gastric bypass Hx gastric bypass        Medication List     STOP taking these medications    dicyclomine 20 MG tablet Commonly known as: BENTYL   doxycycline 100 MG capsule Commonly known as: MONODOX   famotidine 20 MG tablet Commonly known as: PEPCID   HYDROcodone-acetaminophen 5-325 MG tablet Commonly known as: NORCO/VICODIN   ibuprofen 200 MG tablet Commonly known as: ADVIL   lidocaine 5 % Commonly known as: Lidoderm   methylPREDNISolone 4 MG Tbpk tablet Commonly known as: MEDROL DOSEPAK       TAKE these medications    acetaminophen 500 MG tablet Commonly known as: TYLENOL Take 1,000 mg by mouth every 6 (six) hours as needed for mild pain.   albuterol 108 (90 Base) MCG/ACT inhaler Commonly known as: VENTOLIN HFA Inhale 2 puffs into the lungs every 6 (six) hours as needed for wheezing.   benzonatate 200 MG  capsule Commonly known as: TESSALON Take 200 mg by mouth 3 (three) times daily as needed for cough.   ondansetron 4 MG disintegrating tablet Commonly known as: Zofran ODT Dissolve 1 tablet (4 mg total) by mouth every 8 (eight) hours as needed for nausea or vomiting.   oxyCODONE-acetaminophen 5-325 MG tablet Commonly known as: Percocet Take 1 tablet by mouth every 6 (six) hours as needed. What changed: reasons to take this   pantoprazole 40 MG tablet Commonly known as: PROTONIX Take 1 tablet (40 mg total) by mouth 2 (two) times daily before  a meal. Take 1 tablet 2 times daily x1 month, then 1 tablet daily.   promethazine 25 MG tablet Commonly known as: PHENERGAN Take 1 tablet (25 mg total) by mouth every 6 (six) hours as needed for nausea or vomiting.       Allergies  Allergen Reactions   Aspirin Other (See Comments)    Stomach ulcer   Ibuprofen Other (See Comments)    Gastric Bypass--advised to avoid   Nsaids Other (See Comments)    Gastric bypass Hx gastric bypass     Follow-up Information     Arta Silence, MD Follow up in 3 week(s).   Specialty: Gastroenterology Why: Follow-up in 3 to 4 weeks. Contact information: [address] [place] [address] [phone]         PCP. Schedule an appointment as soon as possible for a visit in 2 week(s).                   The results of significant diagnostics from this hospitalization (including imaging, microbiology, ancillary and laboratory) are listed below for reference.    Significant Diagnostic Studies: DG Chest 2 View  Result Date: 02/15/2021 CLINICAL DATA:  Cough EXAM: CHEST - 2 VIEW COMPARISON:  11/06/2020 FINDINGS: The heart size and mediastinal contours are within normal limits. Both lungs are clear. The visualized skeletal structures are unremarkable. IMPRESSION: No active cardiopulmonary disease. Electronically Signed   By: Elmer Picker M.D.   On: 02/15/2021 16:24   CT Abdomen Pelvis W Contrast  Result Date: 02/15/2021 CLINICAL DATA:  Epigastric and right upper quadrant/right flank pain since last night. History of Roux-en-Y gastric bypass. EXAM: CT ABDOMEN AND PELVIS WITH CONTRAST TECHNIQUE: Multidetector CT imaging of the abdomen and pelvis was performed using the standard protocol following bolus administration of intravenous contrast. CONTRAST:  52m OMNIPAQUE IOHEXOL 350 MG/ML SOLN COMPARISON:  October 21, 2020 FINDINGS: Lower chest: No acute abnormality. Hepatobiliary: Focal fat versus altered perfusion (veins of  [place] along the falciform ligament). No suspicious hepatic lesion. Gallbladder surgically absent. No biliary ductal dilation. Pancreas: Edematous appearance of the pancreatic uncinate process with adjacent peripancreatic fluid. No areas of pancreatic parenchymal hypoenhancement. No pancreatic ductal dilation. Spleen: Within normal limits. Adrenals/Urinary Tract: Bilateral adrenal glands are unremarkable. No hydronephrosis. No solid enhancing renal mass. Urinary bladder is unremarkable for degree of distension. Stomach/Bowel: No enteric contrast was administered. 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 No pathologic dilation of small or large bowel. The appendix and terminal ileum appear normal. Vascular/Lymphatic: No abdominal aortic aneurysm. No pathologically enlarged abdominal or pelvic lymph nodes. Reproductive: Uterus and bilateral adnexa are unremarkable. Other: No walled off fluid collections.  No pneumoperitoneum. Musculoskeletal: No acute or significant osseous findings. IMPRESSION: 1. Acute interstitial pancreatitis. No pancreatic ductal dilation or walled off fluid collections. 2. 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 No evidence of bowel obstruction. Electronically Signed   By:  Dahlia Bailiff M.D.   On: 02/15/2021 16:15   MR 3D Recon At Scanner  Result Date: 02/17/2021 CLINICAL DATA:  Pancreatitis. EXAM: MRI ABDOMEN WITHOUT AND WITH CONTRAST (INCLUDING MRCP) TECHNIQUE: Multiplanar multisequence MR imaging of the abdomen was performed both before and after the administration of intravenous contrast. Heavily T2-weighted images of the biliary and pancreatic ducts were obtained, and three-dimensional MRCP images were rendered by post processing. CONTRAST:  89m GADAVIST GADOBUTROL 1 MMOL/ML IV SOLN COMPARISON:  CT February 15, 2021 FINDINGS: Lower chest: No acute abnormality. Hepatobiliary: No hepatic steatosis. No suspicious hepatic lesion. Gallbladder surgically absent.  No biliary ductal dilation, the common bile duct measures 4 mm. No suspicious intraluminal filling defect. Pancreas: Decreased intrinsic T1 signal in the pancreatic head/uncinate process with hazy T2 signal about the pancreatic head/uncinate. No areas of pancreatic parenchymal hypoenhancement no walled off fluid collections. No pancreatic ductal dilation. No pancreatic divisum. Spleen:  Within normal limits. Adrenals/Urinary Tract: The bilateral adrenal glands are unremarkable. No hydronephrosis. No solid enhancing renal mass. Stomach/Bowel: Mild reactive inflammation of the adjacent duodenal loop. No evidence of bowel obstruction. Changes of prior Roux-en-Y gastric bypass. Vascular/Lymphatic: No abdominal aortic aneurysm. The portal, splenic and superior mesenteric veins appear patent. No pathologically enlarged abdominal or pelvic lymph nodes. Other:  No walled off fluid collections. Musculoskeletal: No suspicious bone lesions identified. IMPRESSION: 1. Acute interstitial pancreatitis involving the pancreatic head/uncinate process. No walled off fluid collections or evidence of pancreatic necrosis. 2. No biliary ductal dilation. No suspicious intraluminal filling defect. 3. Changes of prior gastric bypass. Electronically Signed   By: JDahlia BailiffM.D.   On: 02/17/2021 15:14   MR ABDOMEN MRCP W WO CONTAST  Result Date: 02/17/2021 CLINICAL DATA:  Pancreatitis. EXAM: MRI ABDOMEN WITHOUT AND WITH CONTRAST (INCLUDING MRCP) TECHNIQUE: Multiplanar multisequence MR imaging of the abdomen was performed both before and after the administration of intravenous contrast. Heavily T2-weighted images of the biliary and pancreatic ducts were obtained, and three-dimensional MRCP images were rendered by post processing. CONTRAST:  869mGADAVIST GADOBUTROL 1 MMOL/ML IV SOLN COMPARISON:  CT February 15, 2021 FINDINGS: Lower chest: No acute abnormality. Hepatobiliary: No hepatic steatosis. No suspicious hepatic lesion. Gallbladder  surgically absent. No biliary ductal dilation, the common bile duct measures 4 mm. No suspicious intraluminal filling defect. Pancreas: Decreased intrinsic T1 signal in the pancreatic head/uncinate process with hazy T2 signal about the pancreatic head/uncinate. No areas of pancreatic parenchymal hypoenhancement no walled off fluid collections. No pancreatic ductal dilation. No pancreatic divisum. Spleen:  Within normal limits. Adrenals/Urinary Tract: The bilateral adrenal glands are unremarkable. No hydronephrosis. No solid enhancing renal mass. Stomach/Bowel: Mild reactive inflammation of the adjacent duodenal loop. No evidence of bowel obstruction. Changes of prior Roux-en-Y gastric bypass. Vascular/Lymphatic: No abdominal aortic aneurysm. The portal, splenic and superior mesenteric veins appear patent. No pathologically enlarged abdominal or pelvic lymph nodes. Other:  No walled off fluid collections. Musculoskeletal: No suspicious bone lesions identified. IMPRESSION: 1. Acute interstitial pancreatitis involving the pancreatic head/uncinate process. No walled off fluid collections or evidence of pancreatic necrosis. 2. No biliary ductal dilation. No suspicious intraluminal filling defect. 3. Changes of prior gastric bypass. Electronically Signed   By: JeDahlia Bailiff.D.   On: 02/17/2021 15:14    Microbiology: Recent Results (from the past 240 hour(s))  Resp Panel by RT-PCR (Flu A&B, Covid) Nasopharyngeal Swab     Status: None   Collection Time: 02/16/21  7:24 AM   Specimen: Nasopharyngeal Swab; Nasopharyngeal(NP) swabs in vial  transport medium  Result Value Ref Range Status   SARS Coronavirus 2 by RT PCR NEGATIVE NEGATIVE Final    Comment: (NOTE) SARS-CoV-2 target nucleic acids are NOT DETECTED.  The SARS-CoV-2 RNA is generally detectable in upper respiratory specimens during the acute phase of infection. The lowest concentration of SARS-CoV-2 viral copies this assay can detect is 138 copies/mL. A  negative result does not preclude SARS-Cov-2 infection and should not be used as the sole basis for treatment or other patient management decisions. A negative result may occur with  improper specimen collection/handling, submission of specimen other than nasopharyngeal swab, presence of viral mutation(s) within the areas targeted by this assay, and inadequate number of viral copies(<138 copies/mL). A negative result must be combined with clinical observations, patient history, and epidemiological information. The expected result is Negative.  Fact Sheet for Patients:  EntrepreneurPulse.com.au  Fact Sheet for Healthcare Providers:  IncredibleEmployment.be  This test is no t yet approved or cleared by the [place] FDA and  has been authorized for detection and/or diagnosis of SARS-CoV-2 by FDA under an Emergency Use Authorization (EUA). This EUA will remain  in effect (meaning this test can be used) for the duration of the COVID-19 declaration under Section 564(b)(1) of the Act, 21 U.S.C.section 360bbb-3(b)(1), unless the authorization is terminated  or revoked sooner.       Influenza A by PCR NEGATIVE NEGATIVE Final   Influenza B by PCR NEGATIVE NEGATIVE Final    Comment: (NOTE) The Xpert Xpress SARS-CoV-2/FLU/RSV plus assay is intended as an aid in the diagnosis of influenza from Nasopharyngeal swab specimens and should not be used as a sole basis for treatment. Nasal washings and aspirates are unacceptable for Xpert Xpress SARS-CoV-2/FLU/RSV testing.  Fact Sheet for Patients: EntrepreneurPulse.com.au  Fact Sheet for Healthcare Providers: IncredibleEmployment.be  This test is not yet approved or cleared by the [place] FDA and has been authorized for detection and/or diagnosis of SARS-CoV-2 by FDA under an Emergency Use Authorization (EUA). This EUA will remain in effect (meaning this test can  be used) for the duration of the COVID-19 declaration under Section 564(b)(1) of the Act, 21 U.S.C. section 360bbb-3(b)(1), unless the authorization is terminated or revoked.  Performed at [facility], [place] [address], [place], [place] [address]      Labs: Basic Metabolic Panel: Recent Labs  Lab 02/16/21 0604 02/17/21 0510 02/18/21 0534 02/19/21 0706 02/20/21 0603  NA 134* 134* 138 134* 137  K 4.1 3.5 4.0 3.5 3.9  CL 102 103 105 103 102  CO2 _0 GLUCOSE 102* 86 91 95 96  BUN 10 6 5* <5* 7  CREATININE 0.64 0.52 0.53 0.52 0.65  CALCIUM 9.0 8.2* 8.5* 8.2* 8.2*  MG  --   --  1.9  --   --    Liver Function Tests: Recent Labs  Lab 02/16/21 0604 02/17/21 0510 02/18/21 0534 02/19/21 0706 02/20/21 0603  AST 27 192* 54* 39 23  ALT 26 115* 75* 56* 42  ALKPHOS 150* 227* 184* 155* 141*  BILITOT 0.8 0.8 0.3 0.2* 0.5  PROT 7.5 6.0* 6.1* 5.5* 5.9*  ALBUMIN 3.8 2.9* 3.0* 2.8* 2.8*   Recent Labs  Lab 02/15/21 0950 02/16/21 0604 02/17/21 0510 02/18/21 0539 02/19/21 0706  LIPASE 61* 356* _1 No results for input(s): AMMONIA in the last 168 hours. CBC: Recent Labs  Lab 02/15/21 0950 02/16/21 0604 02/17/21 0510 02/18/21 0534  WBC 10.0 11.5*  4.4 4.3  NEUTROABS  --  8.6*  --  2.0  HGB 12.7 13.8 11.6* 11.1*  HCT 39.3 40.9 35.7* 34.4*  MCV 98.5 96.2 98.9 101.5*  PLT 263 292 202 209   Cardiac Enzymes: No results for input(s): CKTOTAL, CKMB, CKMBINDEX, TROPONINI in the last 168 hours. BNP: BNP (last 3 results) No results for input(s): BNP in the last 8760 hours.  ProBNP (last 3 results) No results for input(s): PROBNP in the last 8760 hours.  CBG: No results for input(s): GLUCAP in the last 168 hours.     Signed:  Irine Seal MD.  [facility] 02/20/2021, 10:36 AM

## 2021-02-20 NOTE — Progress Notes (Signed)
[facility] Gastroenterology Progress Note  Maureen Torres 35 y.o. 03-09-1986   Subjective: Feels ok. Denies abdominal pain. Tolerating soft diet.  Objective: Vital signs: Vitals:   02/19/21 2058 02/20/21 0626  BP: 111/80 110/71  Pulse: 63 76  Resp: 16 17  Temp: 98.3 F (36.8 C) 98.1 F (36.7 C)  SpO2: 99% 97%    Physical Exam: Gen: alert, no acute distress, obese, pleasant  HEENT: anicteric sclera CV: RRR Chest: CTA B Abd: RUQ tenderness with guarding, otherwise nontender, soft, nondistended, +BS Ext: no edema  Lab Results: Recent Labs    02/18/21 0534 02/19/21 0706 02/20/21 0603  NA 138 134* 137  K 4.0 3.5 3.9  CL 105 103 102  CO2 25 26 27   GLUCOSE 91 95 96  BUN 5* <5* 7  CREATININE 0.53 0.52 0.65  CALCIUM 8.5* 8.2* 8.2*  MG 1.9  --   --    Recent Labs    02/19/21 0706 02/20/21 0603  AST 39 23  ALT 56* 42  ALKPHOS 155* 141*  BILITOT 0.2* 0.5  PROT 5.5* 5.9*  ALBUMIN 2.8* 2.8*   Recent Labs    02/18/21 0534  WBC 4.3  NEUTROABS 2.0  HGB 11.1*  HCT 34.4*  MCV 101.5*  PLT 209      Assessment/Plan: Acute pancreatitis - resolving. Tolerating soft diet. Alcohol abstinence. Ok to go home from GI standpoint. F/U with Dr. 14/08/22 or [facility] in 3-4 weeks. Will sign off. Call if questions. D/W [facility].   Janee Morn 02/20/2021, 10:03 AM  Questions please call [phone] Patient ID: Dyanne Yorks, female   DOB: December 16, 1985, 35 y.o.   MRN: [id number]

## 2021-05-03 ENCOUNTER — Encounter: Payer: Self-pay | Admitting: General Practice

## 2021-07-08 ENCOUNTER — Encounter: Payer: Medicaid Other | Admitting: Family Medicine

## 2022-01-19 ENCOUNTER — Ambulatory Visit
Admission: EM | Admit: 2022-01-19 | Discharge: 2022-01-19 | Disposition: A | Discharge status: Home / Self Care | Payer: Medicaid Other | Attending: Internal Medicine | Admitting: Internal Medicine

## 2022-01-19 DIAGNOSIS — S29012A Strain of muscle and tendon of back wall of thorax, initial encounter: Secondary | ICD-10-CM | POA: Diagnosis not present

## 2022-01-19 HISTORY — DX: Gastro-esophageal reflux disease without esophagitis: K21.9

## 2022-01-19 MED ORDER — CYCLOBENZAPRINE HCL 5 MG PO TABS: 5.0000 mg | ORAL_TABLET | Freq: Two times a day (BID) | ORAL | 0 refills | Status: DC | PRN

## 2022-01-19 NOTE — ED Triage Notes (Signed)
Pt c/o back pain radiating to her rib cage that is worse when she takes a deep breath. Onset ~ yesterday. States this happened before on her right side when she pulled a muscle.

## 2022-01-19 NOTE — ED Provider Notes (Addendum)
[facility]    CSN: [id number] Arrival date & time: 01/19/22  1837      History   Chief Complaint Chief Complaint  Patient presents with   chest wall discomfort    HPI Maureen Torres is a 36 y.o. female.   Patient presents with left-sided back pain that radiates around to side that started yesterday.  Patient reports that she has had similar pain in the past that was due to a muscle strain.  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  She has taken over-the-counter pain reliever with minimal improvement.  Patient reports any type of movement causes pain to be exacerbated.  Denies any associated urinary symptoms.  Denies abdominal pain, nausea, vomiting, diarrhea, constipation.  Patient having normal bowel movements and denies blood in stool.     Past Medical History:  Diagnosis Date   Acid reflux     Patient Active Problem List   Diagnosis Date Noted   Constipation    Gastroesophageal reflux disease    Hyponatremia 02/16/2021   Hyperglycemia 02/16/2021   Alkaline phosphatase elevation 02/16/2021   Acute pancreatitis 10/22/2020    Past Surgical History:  Procedure Laterality Date   CHOLECYSTECTOMY     rous and y      OB History   No obstetric history on file.      Home Medications    Prior to Admission medications   Medication Sig Start Date End Date Taking? Authorizing Provider  cyclobenzaprine (FLEXERIL) 5 MG tablet Take 1 tablet (5 mg total) by mouth 2 (two) times daily as needed for muscle spasms. 01/19/22  Yes , Rolly Salter E, FNP  acetaminophen (TYLENOL) 500 MG tablet Take 1,000 mg by mouth every 6 (six) hours as needed for mild pain.    [provider]  albuterol (VENTOLIN HFA) 108 (90 Base) MCG/ACT inhaler Inhale 2 puffs into the lungs every 6 (six) hours as needed for wheezing. 02/12/21   [provider]  benzonatate (TESSALON)  200 MG capsule Take 200 mg by mouth 3 (three) times daily as needed for cough. 02/09/21   [provider]  ondansetron (ZOFRAN ODT) 4 MG disintegrating tablet Dissolve 1 tablet (4 mg total) by mouth every 8 (eight) hours as needed for nausea or vomiting. 02/20/21   Rodolph Bong, MD  oxyCODONE-acetaminophen (PERCOCET) 5-325 MG tablet Take 1 tablet by mouth every 6 (six) hours as needed. Patient taking differently: Take 1 tablet by mouth every 6 (six) hours as needed for moderate pain. 02/15/21   Charlynne Pander, MD  pantoprazole (PROTONIX) 40 MG tablet Take 1 tablet (40 mg total) by mouth 2 (two) times daily before a meal. Take 1 tablet 2 times daily x1 month, then 1 tablet daily. 02/20/21   Rodolph Bong, MD  promethazine (PHENERGAN) 25 MG tablet Take 1 tablet (25 mg total) by mouth every 6 (six) hours as needed for nausea or vomiting. 02/15/21   Charlynne Pander, MD    Family History Family History  Problem Relation Age of Onset   Hypertension Other    Pancreatitis Neg Hx     Social History Social History   Tobacco Use   Smoking status: Former    Types: Cigarettes   Smokeless tobacco: Never  Vaping Use   Vaping Use: Every day  Substance Use Topics   Alcohol use: Yes  Alcohol/week: 4.0 standard drinks of alcohol    Types: 4 Glasses of wine per week   Drug use: Never     Allergies   Aspirin, Ibuprofen, and Nsaids   Review of Systems Review of Systems Per HPI  Physical Exam Triage Vital Signs ED Triage Vitals  Enc Vitals Group     BP 01/19/22 1925 130/80     Pulse Rate 01/19/22 1925 62     Resp 01/19/22 1925 16     Temp 01/19/22 1925 98 F (36.7 C)     Temp Source 01/19/22 1925 Oral     SpO2 01/19/22 1925 99 %     Weight --      Height --      Head Circumference --      Peak Flow --      Pain Score 01/19/22 1926 8     Pain Loc --      Pain Edu? --      Excl. in GC? --    No data found.  Updated Vital Signs BP 130/80 (BP Location:  Left Arm)   Pulse 62   Temp 98 F (36.7 C) (Oral)   Resp 16   SpO2 99%   Visual Acuity Right Eye Distance:   Left Eye Distance:   Bilateral Distance:    Right Eye Near:   Left Eye Near:    Bilateral Near:     Physical Exam Constitutional:      General: She is not in acute distress.    Appearance: Normal appearance. She is not toxic-appearing or diaphoretic.  HENT:     Head: Normocephalic and atraumatic.  Eyes:     Extraocular Movements: Extraocular movements intact.     Conjunctiva/sclera: Conjunctivae normal.  Cardiovascular:     Rate and Rhythm: Normal rate and regular rhythm.     Pulses: Normal pulses.     Heart sounds: Normal heart sounds.  Pulmonary:     Effort: Pulmonary effort is normal.     Breath sounds: Normal breath sounds.  Abdominal:     General: Bowel sounds are normal. There is no distension.     Palpations: Abdomen is soft.     Tenderness: There is no abdominal tenderness.  Musculoskeletal:       Back:     Comments: Tenderness to palpation to left mid thoracic back that extends slightly into lateral side.  No obvious swelling, discoloration, lacerations, abrasions noted.  No direct spinal tenderness or crepitus noted.  Neurological:     General: No focal deficit present.     Mental Status: She is alert and oriented to person, place, and time. Mental status is at baseline.  Psychiatric:        Mood and Affect: Mood normal.        Behavior: Behavior normal.        Thought Content: Thought content normal.        Judgment: Judgment normal.      UC Treatments / Results  Labs (all labs ordered are listed, but only abnormal results are displayed) Labs Reviewed - No data to display  EKG   Radiology No results found.  Procedures Procedures (including critical care time)  Medications Ordered in UC Medications - No data to display  Initial Impression / Assessment and Plan / UC Course  I have reviewed the triage vital signs and the nursing  notes.  Pertinent labs & imaging results that were available during my care of the patient were reviewed  by me and considered in my medical decision making (see chart for details).     Physical exam is consistent with thoracic muscle strain.  Do not think imaging is necessary given no direct injury.  Will treat with muscle relaxer as patient has taken this before and tolerated well.  Patient reports that she is not allowed to take NSAIDs given that she has a history of gastric surgery.  Patient reports that she only takes omeprazole so muscle relaxer should be safe.  Tylenol should be safe if used sparingly as it appears that elevated LFTs in the past were from acute pancreatitis.  Discussed alternating ice and heat to affected area.  Patient was given strict ER precautions and advised to follow-up if symptoms persist or worsen.  Patient verbalized understanding and was agreeable with plan. Final Clinical Impressions(s) / UC Diagnoses   Final diagnoses:  Muscle strain of left upper back, initial encounter     Discharge Instructions      It appears that you have a muscle strain.  I am treating this with a muscle relaxer.  Please be advised that muscle relaxer can cause drowsiness so do not drive or drink alcohol with taking this.  Follow-up if symptoms persist or worsen.  Also alternate ice and heat affected area.    ED Prescriptions     Medication Sig Dispense Auth. Provider   cyclobenzaprine (FLEXERIL) 5 MG tablet Take 1 tablet (5 mg total) by mouth 2 (two) times daily as needed for muscle spasms. 20 tablet [place], [place] [address], [place]      I have reviewed the PDMP during this encounter.   Gustavus Bryant, [place] 01/19/22 [address], [place] 01/19/22 [address], [place] 01/19/22 [address], [place] 01/19/22 2013

## 2022-01-19 NOTE — Discharge Instructions (Signed)
It appears that you have a muscle strain.  I am treating this with a muscle relaxer.  Please be advised that muscle relaxer can cause drowsiness so do not drive or drink alcohol with taking this.  Follow-up if symptoms persist or worsen.  Also alternate ice and heat affected area.

## 2022-09-05 IMAGING — CT CT ABD-PELV W/ CM
2 of 4 series · 17 of 46 positions shown, 19 images · IV contrast (omnipaque)
Comparison: None.

CLINICAL DATA: Abdominal pain/bloating x2 days

EXAM:
CT ABDOMEN AND PELVIS WITH CONTRAST
TECHNIQUE: Multidetector CT imaging of the abdomen and pelvis was performed
using the standard protocol following bolus administration of
intravenous contrast.
CONTRAST:  80mL OMNIPAQUE IOHEXOL 350 MG/ML SOLN

[Series 2: axial st · axial · 0.76mm/px · z∈[-483,-58]mm · 14 of 97 slices shown, 16 images]
[im 6/97  soft-tissue]
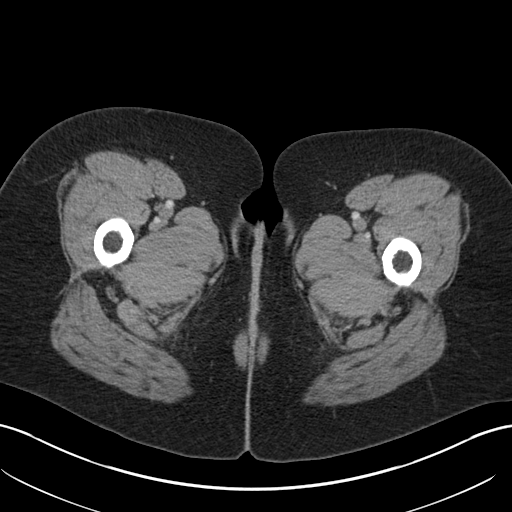
[im 6/97  bone]
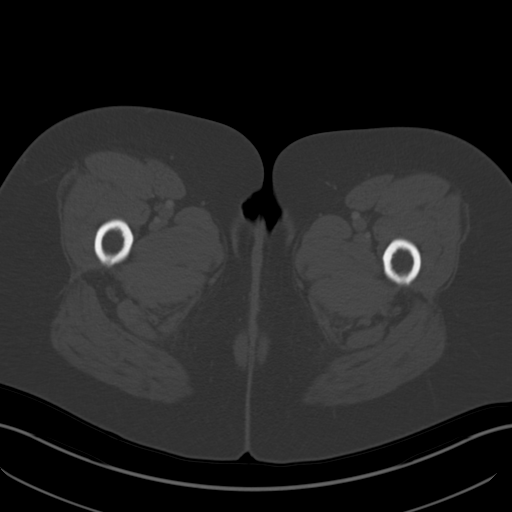
[im 12/97  soft-tissue]
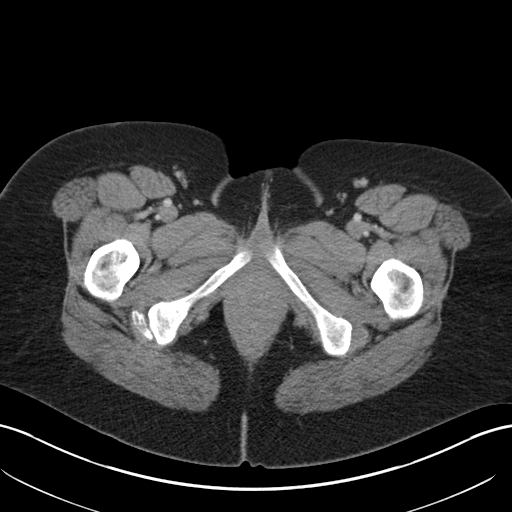
[im 17/97  soft-tissue]
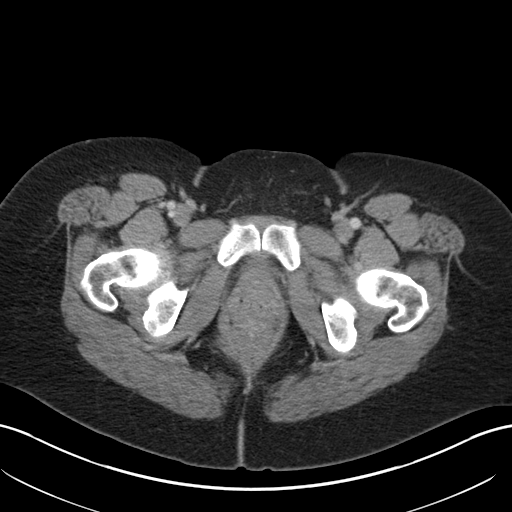
[im 29/97  soft-tissue]
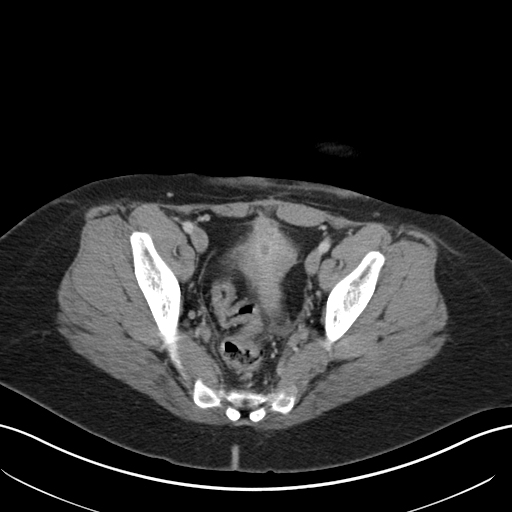
[im 34/97  soft-tissue]
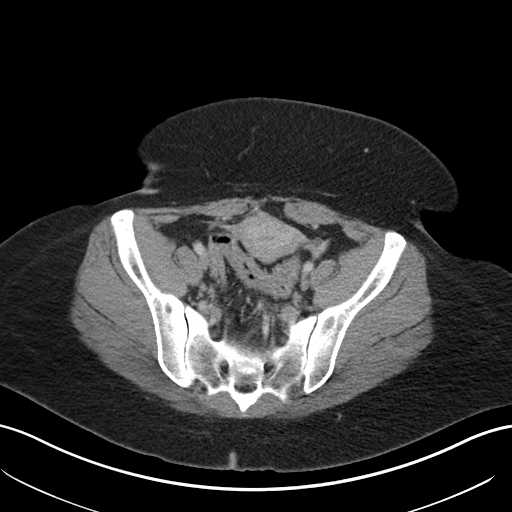
[im 40/97  soft-tissue]
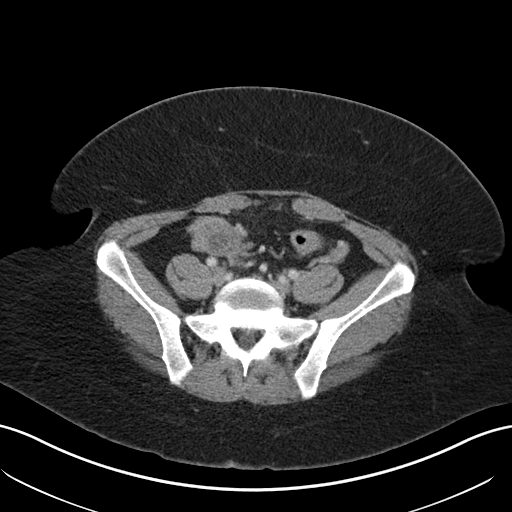
[im 46/97  soft-tissue]
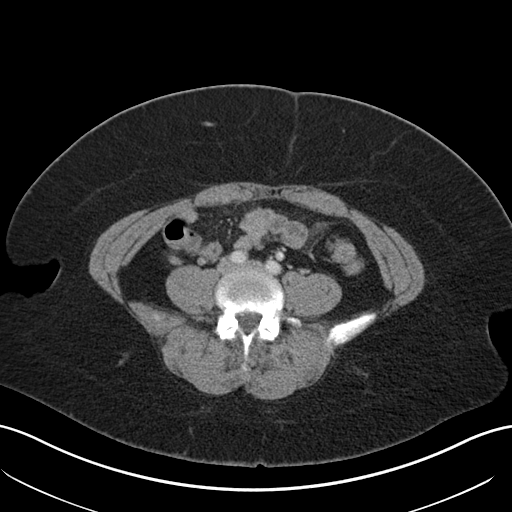
[im 51/97  soft-tissue]
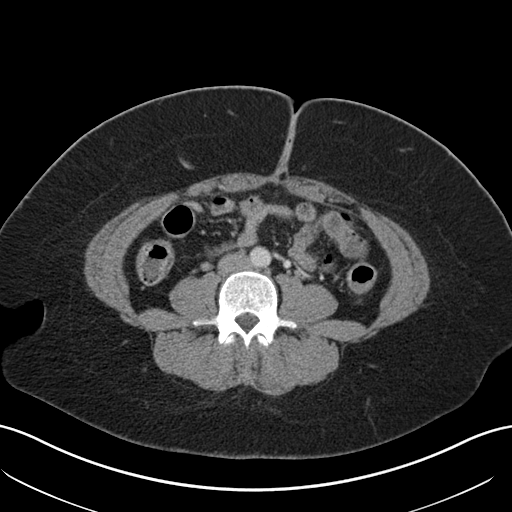
[im 57/97  soft-tissue]
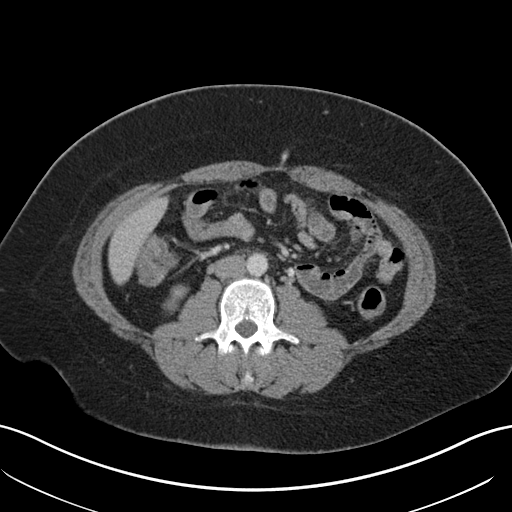
[im 57/97  bone]
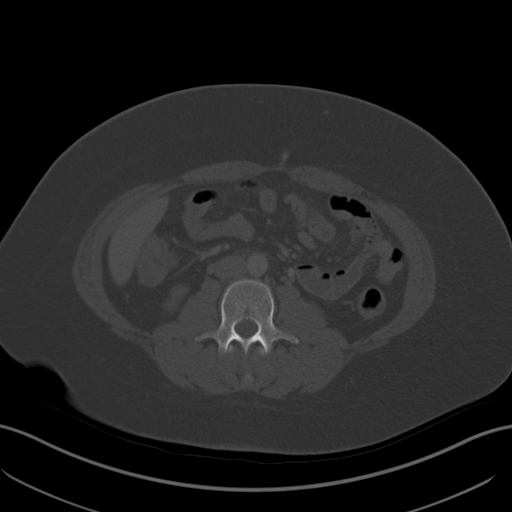
[im 63/97  soft-tissue]
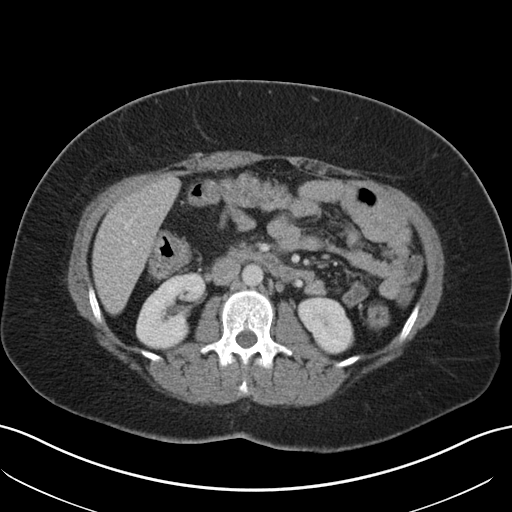
[im 74/97  soft-tissue]
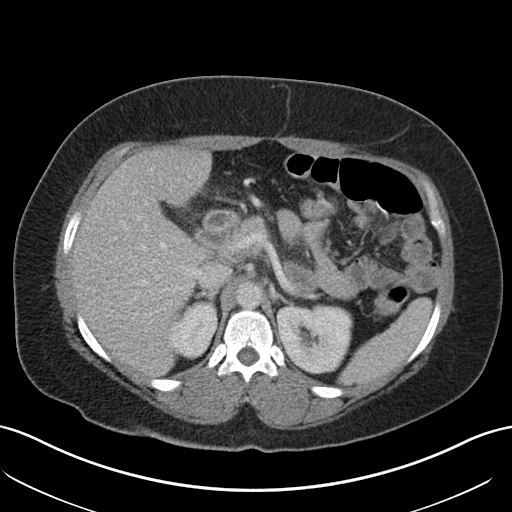
[im 80/97  soft-tissue]
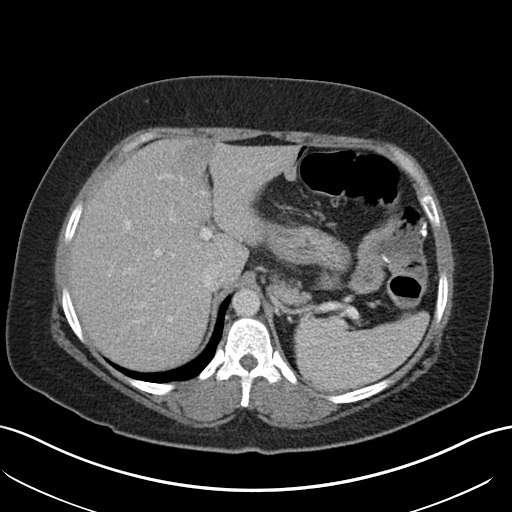
[im 85/97  soft-tissue]
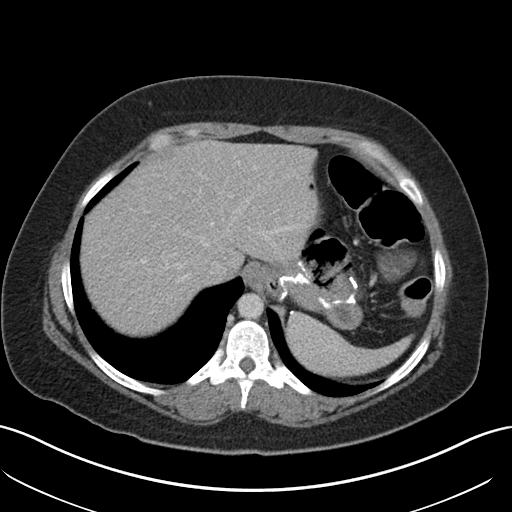
[im 91/97  soft-tissue]
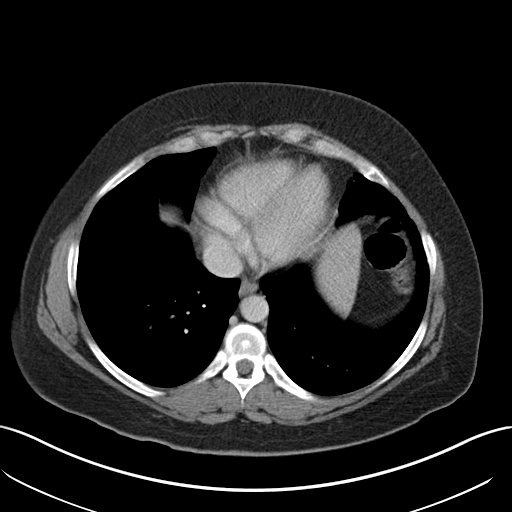

[Series 4: coronal st · coronal · 0.92mm/px · 3 of 144 slices shown]
[im 48/144  soft-tissue]
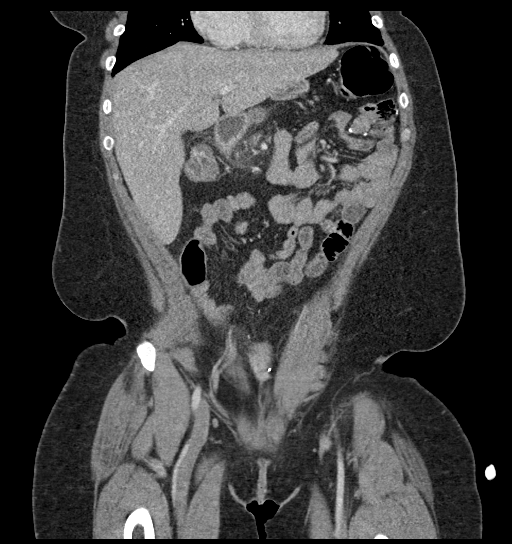
[im 64/144  soft-tissue]
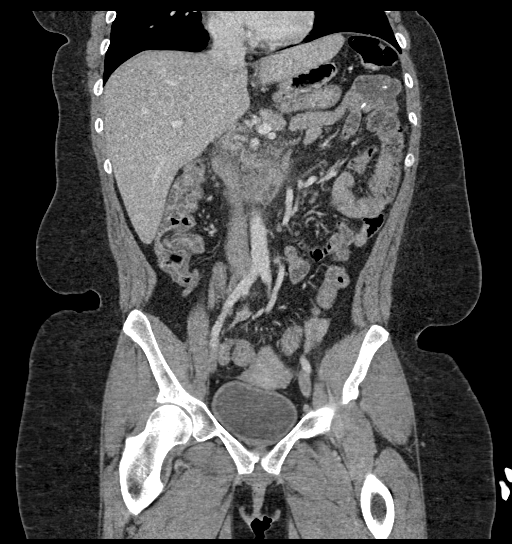
[im 80/144  soft-tissue]
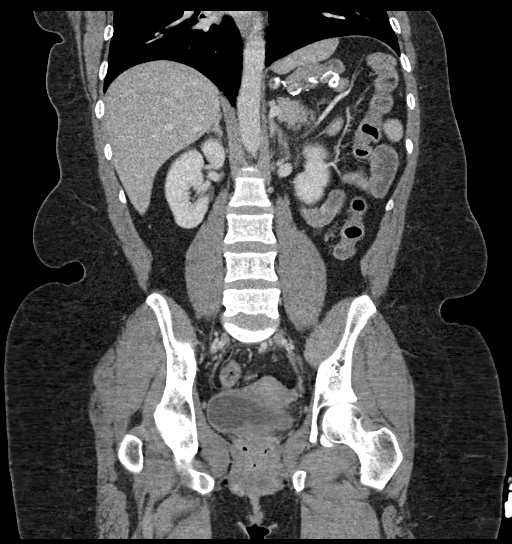

[17 of 46 positions shown; findings below may reference images not displayed]

FINDINGS: Lower chest: Faint ground-glass opacity/mosaic attenuation at the
lung bases, possibly reflecting sequela of prior atypical infection.

Hepatobiliary: Liver is within normal limits.

Status post cholecystectomy. No intrahepatic or extrahepatic ductal
dilatation.

Pancreas: Inflammatory changes along the uncinate process (series
2/image 29), suggesting mild acute pancreatitis. No peripancreatic
fluid collection/walled-off necrosis.

Spleen: Within normal limits.

Adrenals/Urinary Tract: Adrenal glands are within normal limits.

Kidneys are within normal limits.  No hydronephrosis.

Bladder is within normal limits.

Stomach/Bowel: Postsurgical changes related to gastric bypass.

No evidence of bowel obstruction.

Normal appendix (series 2/image 52).

Vascular/Lymphatic: No evidence of abdominal aortic aneurysm.

No suspicious abdominopelvic lymphadenopathy.

Reproductive: Anterior position of the uterus, suggesting prior
C-section.

Bilateral ovaries are within normal limits, noting a right corpus
luteum.

Other: No abdominopelvic ascites.

Musculoskeletal: Visualized osseous structures are within normal
limits.
IMPRESSION: Suspected mild acute pancreatitis.  No associated complication.

## 2022-09-10 ENCOUNTER — Inpatient Hospital Stay (HOSPITAL_COMMUNITY): Payer: Medicaid Other

## 2022-09-10 ENCOUNTER — Encounter (HOSPITAL_COMMUNITY): Payer: Self-pay

## 2022-09-10 ENCOUNTER — Inpatient Hospital Stay (HOSPITAL_COMMUNITY)
Admission: EM | Admit: 2022-09-10 | Discharge: 2022-09-12 | DRG: 433 | Disposition: A | Discharge status: Other Acute Inpatient Hospital | Payer: Medicaid Other | Attending: Internal Medicine | Admitting: Internal Medicine

## 2022-09-10 ENCOUNTER — Other Ambulatory Visit: Payer: Self-pay

## 2022-09-10 ENCOUNTER — Emergency Department (HOSPITAL_COMMUNITY): Payer: Medicaid Other

## 2022-09-10 DIAGNOSIS — Z79899 Other long term (current) drug therapy: Secondary | ICD-10-CM | POA: Diagnosis not present

## 2022-09-10 DIAGNOSIS — K219 Gastro-esophageal reflux disease without esophagitis: Secondary | ICD-10-CM | POA: Diagnosis present

## 2022-09-10 DIAGNOSIS — F17290 Nicotine dependence, other tobacco product, uncomplicated: Secondary | ICD-10-CM | POA: Diagnosis present

## 2022-09-10 DIAGNOSIS — Z8249 Family history of ischemic heart disease and other diseases of the circulatory system: Secondary | ICD-10-CM | POA: Diagnosis not present

## 2022-09-10 DIAGNOSIS — Z9049 Acquired absence of other specified parts of digestive tract: Secondary | ICD-10-CM

## 2022-09-10 DIAGNOSIS — R45851 Suicidal ideations: Secondary | ICD-10-CM | POA: Diagnosis present

## 2022-09-10 DIAGNOSIS — Z9884 Bariatric surgery status: Secondary | ICD-10-CM

## 2022-09-10 DIAGNOSIS — Z7141 Alcohol abuse counseling and surveillance of alcoholic: Secondary | ICD-10-CM | POA: Diagnosis not present

## 2022-09-10 DIAGNOSIS — Z8711 Personal history of peptic ulcer disease: Secondary | ICD-10-CM | POA: Diagnosis not present

## 2022-09-10 DIAGNOSIS — Y908 Blood alcohol level of 240 mg/100 ml or more: Secondary | ICD-10-CM | POA: Diagnosis present

## 2022-09-10 DIAGNOSIS — K760 Fatty (change of) liver, not elsewhere classified: Secondary | ICD-10-CM | POA: Diagnosis present

## 2022-09-10 DIAGNOSIS — R7989 Other specified abnormal findings of blood chemistry: Secondary | ICD-10-CM | POA: Diagnosis present

## 2022-09-10 DIAGNOSIS — F322 Major depressive disorder, single episode, severe without psychotic features: Secondary | ICD-10-CM | POA: Diagnosis present

## 2022-09-10 DIAGNOSIS — F17210 Nicotine dependence, cigarettes, uncomplicated: Secondary | ICD-10-CM | POA: Diagnosis present

## 2022-09-10 DIAGNOSIS — F1014 Alcohol abuse with alcohol-induced mood disorder: Secondary | ICD-10-CM | POA: Diagnosis present

## 2022-09-10 DIAGNOSIS — F10939 Alcohol use, unspecified with withdrawal, unspecified: Secondary | ICD-10-CM | POA: Diagnosis present

## 2022-09-10 DIAGNOSIS — R0781 Pleurodynia: Secondary | ICD-10-CM | POA: Diagnosis present

## 2022-09-10 DIAGNOSIS — D529 Folate deficiency anemia, unspecified: Secondary | ICD-10-CM | POA: Diagnosis present

## 2022-09-10 DIAGNOSIS — Z560 Unemployment, unspecified: Secondary | ICD-10-CM | POA: Diagnosis not present

## 2022-09-10 DIAGNOSIS — F10930 Alcohol use, unspecified with withdrawal, uncomplicated: Secondary | ICD-10-CM | POA: Diagnosis not present

## 2022-09-10 DIAGNOSIS — F1210 Cannabis abuse, uncomplicated: Secondary | ICD-10-CM | POA: Diagnosis present

## 2022-09-10 DIAGNOSIS — M25552 Pain in left hip: Secondary | ICD-10-CM | POA: Diagnosis present

## 2022-09-10 DIAGNOSIS — F332 Major depressive disorder, recurrent severe without psychotic features: Secondary | ICD-10-CM | POA: Diagnosis not present

## 2022-09-10 DIAGNOSIS — F10139 Alcohol abuse with withdrawal, unspecified: Secondary | ICD-10-CM | POA: Diagnosis present

## 2022-09-10 DIAGNOSIS — F411 Generalized anxiety disorder: Secondary | ICD-10-CM | POA: Diagnosis present

## 2022-09-10 DIAGNOSIS — K7010 Alcoholic hepatitis without ascites: Principal | ICD-10-CM | POA: Diagnosis present

## 2022-09-10 DIAGNOSIS — E872 Acidosis, unspecified: Secondary | ICD-10-CM | POA: Diagnosis present

## 2022-09-10 DIAGNOSIS — Z886 Allergy status to analgesic agent status: Secondary | ICD-10-CM | POA: Diagnosis not present

## 2022-09-10 LAB — CBC WITH DIFFERENTIAL/PLATELET
Abs Immature Granulocytes: 0 10*3/uL (ref 0.00–0.07)
Basophils Absolute: 0 10*3/uL (ref 0.0–0.1)
Basophils Relative: 0 %
Eosinophils Absolute: 0 10*3/uL (ref 0.0–0.5)
Eosinophils Relative: 0 %
HCT: 32.7 % — ABNORMAL LOW (ref 36.0–46.0)
Hemoglobin: 10.9 g/dL — ABNORMAL LOW (ref 12.0–15.0)
Immature Granulocytes: 0 %
Lymphocytes Relative: 62 %
Lymphs Abs: 1.7 10*3/uL (ref 0.7–4.0)
MCH: 32.4 pg (ref 26.0–34.0)
MCHC: 33.3 g/dL (ref 30.0–36.0)
MCV: 97.3 fL (ref 80.0–100.0)
Monocytes Absolute: 0.2 10*3/uL (ref 0.1–1.0)
Monocytes Relative: 8 %
Neutro Abs: 0.8 10*3/uL — ABNORMAL LOW (ref 1.7–7.7)
Neutrophils Relative %: 30 %
Platelets: 235 10*3/uL (ref 150–400)
RBC: 3.36 MIL/uL — ABNORMAL LOW (ref 3.87–5.11)
RDW: 14 % (ref 11.5–15.5)
WBC: 2.8 10*3/uL — ABNORMAL LOW (ref 4.0–10.5)
nRBC: 0 % (ref 0.0–0.2)

## 2022-09-10 LAB — COMPREHENSIVE METABOLIC PANEL WITH GFR
ALT: 93 U/L — ABNORMAL HIGH (ref 0–44)
AST: 699 U/L — ABNORMAL HIGH (ref 15–41)
Albumin: 3.7 g/dL (ref 3.5–5.0)
Alkaline Phosphatase: 130 U/L — ABNORMAL HIGH (ref 38–126)
Anion gap: 9 (ref 5–15)
BUN: 6 mg/dL (ref 6–20)
CO2: 19 mmol/L — ABNORMAL LOW (ref 22–32)
Calcium: 8.4 mg/dL — ABNORMAL LOW (ref 8.9–10.3)
Chloride: 113 mmol/L — ABNORMAL HIGH (ref 98–111)
Creatinine, Ser: 0.73 mg/dL (ref 0.44–1.00)
GFR, Estimated: >60 mL/min
Glucose, Bld: 107 mg/dL — ABNORMAL HIGH (ref 70–99)
Potassium: 3.8 mmol/L (ref 3.5–5.1)
Sodium: 141 mmol/L (ref 135–145)
Total Bilirubin: 0.6 mg/dL (ref 0.3–1.2)
Total Protein: 7.4 g/dL (ref 6.5–8.1)

## 2022-09-10 LAB — RAPID URINE DRUG SCREEN, HOSP PERFORMED
Amphetamines: NOT DETECTED
Barbiturates: NOT DETECTED
Benzodiazepines: NOT DETECTED
Cocaine: NOT DETECTED
Opiates: NOT DETECTED
Tetrahydrocannabinol: POSITIVE — AB

## 2022-09-10 LAB — HEPATITIS PANEL, ACUTE
HCV Ab: NONREACTIVE
Hep A IgM: NONREACTIVE
Hep B C IgM: NONREACTIVE
Hepatitis B Surface Ag: NONREACTIVE

## 2022-09-10 LAB — RETICULOCYTES
Immature Retic Fract: 24 % — ABNORMAL HIGH (ref 2.3–15.9)
RBC.: 3.23 MIL/uL — ABNORMAL LOW (ref 3.87–5.11)
Retic Count, Absolute: 96.9 10*3/uL (ref 19.0–186.0)
Retic Ct Pct: 3 % (ref 0.4–3.1)

## 2022-09-10 LAB — IRON AND TIBC
Iron: 166 ug/dL (ref 28–170)
Saturation Ratios: 75 % — ABNORMAL HIGH (ref 10.4–31.8)
TIBC: 223 ug/dL — ABNORMAL LOW (ref 250–450)
UIBC: 57 ug/dL

## 2022-09-10 LAB — ACETAMINOPHEN LEVEL: Acetaminophen (Tylenol), Serum: <10 ug/mL — ABNORMAL LOW (ref 10–30)

## 2022-09-10 LAB — TSH: TSH: 0.867 u[IU]/mL (ref 0.350–4.500)

## 2022-09-10 LAB — VITAMIN B12: Vitamin B-12: 858 pg/mL (ref 180–914)

## 2022-09-10 LAB — LIPASE, BLOOD: Lipase: 23 U/L (ref 11–51)

## 2022-09-10 LAB — PROTIME-INR
INR: 1.1 (ref 0.8–1.2)
INR: 1.1 (ref 0.8–1.2)
Prothrombin Time: 14.5 seconds (ref 11.4–15.2)
Prothrombin Time: 14.7 seconds (ref 11.4–15.2)

## 2022-09-10 LAB — APTT
aPTT: 24 seconds (ref 24–36)
aPTT: 23 seconds — ABNORMAL LOW (ref 24–36)

## 2022-09-10 LAB — ETHANOL: Alcohol, Ethyl (B): 260 mg/dL — ABNORMAL HIGH (ref <10)

## 2022-09-10 LAB — FOLATE: Folate: 3 ng/mL — ABNORMAL LOW (ref >5.9)

## 2022-09-10 LAB — FERRITIN: Ferritin: 97 ng/mL (ref 11–307)

## 2022-09-10 LAB — HCG, SERUM, QUALITATIVE: Preg, Serum: NEGATIVE

## 2022-09-10 LAB — SALICYLATE LEVEL: Salicylate Lvl: <7 mg/dL — ABNORMAL LOW (ref 7.0–30.0)

## 2022-09-10 LAB — AMMONIA: Ammonia: 115 umol/L — ABNORMAL HIGH (ref 9–35)

## 2022-09-10 MED ORDER — THIAMINE MONONITRATE 100 MG PO TABS
100.0000 mg | ORAL_TABLET | Freq: Every day | ORAL | Status: DC
  Administered 2022-09-11 – 2022-09-12 (×2): 100 mg via ORAL
  Filled 2022-09-10 (×3): qty 1

## 2022-09-10 MED ORDER — ACETAMINOPHEN 650 MG RE SUPP: 650.0000 mg | Freq: Four times a day (QID) | RECTAL | Status: DC | PRN

## 2022-09-10 MED ORDER — THIAMINE HCL 100 MG/ML IJ SOLN
100.0000 mg | Freq: Every day | INTRAMUSCULAR | Status: DC
  Administered 2022-09-10: 100 mg via INTRAVENOUS
  Filled 2022-09-10: qty 2

## 2022-09-10 MED ORDER — LORAZEPAM 1 MG PO TABS
1.0000 mg | ORAL_TABLET | ORAL | Status: DC | PRN
  Administered 2022-09-10 (×2): 1 mg via ORAL
  Filled 2022-09-10 (×2): qty 1

## 2022-09-10 MED ORDER — FOLIC ACID 1 MG PO TABS
1.0000 mg | ORAL_TABLET | Freq: Every day | ORAL | Status: DC
  Administered 2022-09-10 – 2022-09-12 (×3): 1 mg via ORAL
  Filled 2022-09-10 (×3): qty 1

## 2022-09-10 MED ORDER — ACETAMINOPHEN 325 MG PO TABS
650.0000 mg | ORAL_TABLET | Freq: Four times a day (QID) | ORAL | Status: DC | PRN
  Administered 2022-09-10: 650 mg via ORAL
  Filled 2022-09-10: qty 2

## 2022-09-10 MED ORDER — ENOXAPARIN SODIUM 40 MG/0.4ML IJ SOSY
40.0000 mg | PREFILLED_SYRINGE | INTRAMUSCULAR | Status: DC
  Administered 2022-09-11 – 2022-09-12 (×2): 40 mg via SUBCUTANEOUS
  Filled 2022-09-10 (×2): qty 0.4

## 2022-09-10 MED ORDER — GABAPENTIN 100 MG PO CAPS
200.0000 mg | ORAL_CAPSULE | Freq: Two times a day (BID) | ORAL | Status: DC
  Administered 2022-09-10 – 2022-09-12 (×4): 200 mg via ORAL
  Filled 2022-09-10 (×4): qty 2

## 2022-09-10 MED ORDER — ADULT MULTIVITAMIN W/MINERALS CH
1.0000 | ORAL_TABLET | Freq: Every day | ORAL | Status: DC
  Administered 2022-09-10 – 2022-09-12 (×3): 1 via ORAL
  Filled 2022-09-10 (×3): qty 1

## 2022-09-10 MED ORDER — ESCITALOPRAM OXALATE 10 MG PO TABS
10.0000 mg | ORAL_TABLET | Freq: Every day | ORAL | Status: DC
  Administered 2022-09-10 – 2022-09-12 (×3): 10 mg via ORAL
  Filled 2022-09-10 (×3): qty 1

## 2022-09-10 MED ORDER — LORAZEPAM 1 MG PO TABS
1.0000 mg | ORAL_TABLET | Freq: Once | ORAL | Status: AC
  Administered 2022-09-10: 1 mg via ORAL
  Filled 2022-09-10: qty 1

## 2022-09-10 MED ORDER — PANTOPRAZOLE SODIUM 40 MG IV SOLR
40.0000 mg | Freq: Two times a day (BID) | INTRAVENOUS | Status: DC
  Administered 2022-09-10 (×2): 40 mg via INTRAVENOUS
  Filled 2022-09-10: qty 10

## 2022-09-10 MED ORDER — LORAZEPAM 1 MG PO TABS
1.0000 mg | ORAL_TABLET | ORAL | Status: DC | PRN
  Administered 2022-09-10 – 2022-09-12 (×9): 1 mg via ORAL
  Filled 2022-09-10 (×5): qty 1
  Filled 2022-09-10: qty 2
  Filled 2022-09-10 (×3): qty 1

## 2022-09-10 MED ORDER — ACETAMINOPHEN 325 MG PO TABS
650.0000 mg | ORAL_TABLET | ORAL | Status: DC | PRN
  Administered 2022-09-10 – 2022-09-12 (×5): 650 mg via ORAL
  Filled 2022-09-10 (×5): qty 2

## 2022-09-10 MED ORDER — LACTULOSE 10 GM/15ML PO SOLN
10.0000 g | Freq: Two times a day (BID) | ORAL | Status: DC
  Administered 2022-09-10: 10 g via ORAL
  Filled 2022-09-10: qty 15

## 2022-09-10 NOTE — Consult Note (Signed)
[facility] Face-to-Face Psychiatry Consult   Reason for Consult:'' SUICIDAL IDEATIONS AT HOME- CURRENTLY DENIES- TOC CONSULTED FOR SUBSATNCE ISSUES (ETOH/THC).'' Referring Physician:  Junious Silk, NP Patient Identification: Maureen Torres MRN:  [id number] Principal Diagnosis: Acute alcoholic hepatitis Diagnosis:  Principal Problem:   Acute alcoholic hepatitis Active Problems:   Major depressive disorder, single episode, severe (HCC)   Generalized anxiety disorder   Alcohol abuse with alcohol-induced mood disorder (HCC)   Total Time spent with patient: 1 hour  Subjective:   Maureen Torres is a 37 y.o. female patient admitted with suicidal.  HPI:  37 year old female with history of gastric bypass, acid reflux and alcohol use disorder-severe who was admitted for suicidal ideation. Patient states that she has been dealing with depression, anxiety and childhood traumas most of her life. However, she has not formally sought a psychiatric or counseling help. Instead, she has been self medicating by drinking alcohol. She states that her symptoms has been getting worse in the last 2 months to the extent that she has been having suicidal thoughts but with no specific plan. She reports having a lot of issues with her family, issues with her older son, significant other, job, finances leading to social isolation and excessive alcohol use. She reports prior history of her attempt to overdose as a teenager and sates "I do not want to go back there". Patient reports that she has been feeling like giving up and was debating on putting her house up while having thoughts of driving her car off a bridge. She denies psychosis, delusions, mood swings but unable to contract for safety.  Past Psychiatric History: as above  Risk to Self:  suicidal thoughts without a plan today Risk to Others:  denies Prior Inpatient Therapy:  none Prior Outpatient Therapy:  none  Past Medical History:  Past Medical History:   Diagnosis Date   Acid reflux     Past Surgical History:  Procedure Laterality Date   CHOLECYSTECTOMY     rous and y     Family History:  Family History  Problem Relation Age of Onset   Hypertension Other    Pancreatitis Neg Hx    Family Psychiatric  History:   Social History:  Social History   Substance and Sexual Activity  Alcohol Use Yes   Alcohol/week: 4.0 standard drinks of alcohol   Types: 4 Glasses of wine per week     Social History   Substance and Sexual Activity  Drug Use Never    Social History   Socioeconomic History   Marital status: Single    Spouse name: Not on file   Number of children: Not on file   Years of education: Not on file   Highest education level: Not on file  Occupational History   Not on file  Tobacco Use   Smoking status: Former    Types: Cigarettes   Smokeless tobacco: Never  Vaping Use   Vaping Use: Every day  Substance and Sexual Activity   Alcohol use: Yes    Alcohol/week: 4.0 standard drinks of alcohol    Types: 4 Glasses of wine per week   Drug use: Never   Sexual activity: Not on file  Other Topics Concern   Not on file  Social History Narrative   Not on file   Social Determinants of Health   Financial Resource Strain: Not on file  Food Insecurity: Not on file  Transportation Needs: Not on file  Physical Activity: Not on file  Stress: Not  on file  Social Connections: Not on file   Additional Social History:    Allergies:   Allergies  Allergen Reactions   Aspirin Other (See Comments)    Stomach ulcer   Ibuprofen Other (See Comments)    Gastric Bypass--advised to avoid   Nsaids Other (See Comments)    Gastric bypass Hx gastric bypass     Labs:  Results for orders placed or performed during the hospital encounter of 09/10/22 (from the past 48 hour(s))  Comprehensive metabolic panel     Status: Abnormal   Collection Time: 09/10/22  1:45 AM  Result Value Ref Range   Sodium 141 135 - 145 mmol/L    Potassium 3.8 3.5 - 5.1 mmol/L   Chloride 113 (H) 98 - 111 mmol/L   CO2 19 (L) 22 - 32 mmol/L   Glucose, Bld 107 (H) 70 - 99 mg/dL    Comment: Glucose reference range applies only to samples taken after fasting for at least 8 hours.   BUN 6 6 - 20 mg/dL   Creatinine, Ser 3.08 0.44 - 1.00 mg/dL   Calcium 8.4 (L) 8.9 - 10.3 mg/dL   Total Protein 7.4 6.5 - 8.1 g/dL   Albumin 3.7 3.5 - 5.0 g/dL   AST 657 (H) 15 - 41 U/L   ALT 93 (H) 0 - 44 U/L   Alkaline Phosphatase 130 (H) 38 - 126 U/L   Total Bilirubin 0.6 0.3 - 1.2 mg/dL   GFR, Estimated >84 >69 mL/min    Comment: (NOTE) Calculated using the CKD-EPI Creatinine Equation (2021)    Anion gap 9 5 - 15    Comment: Performed at [facility], [address], [place], [place] [address]  hCG, serum, qualitative     Status: None   Collection Time: 09/10/22  1:45 AM  Result Value Ref Range   Preg, Serum NEGATIVE NEGATIVE    Comment:        THE SENSITIVITY OF THIS METHODOLOGY IS >10 mIU/mL. Performed at [facility], [address] [place], [place] [address]   Rapid urine drug screen (hospital performed)     Status: Abnormal   Collection Time: 09/10/22  2:02 AM  Result Value Ref Range   Opiates NONE DETECTED NONE DETECTED   Cocaine NONE DETECTED NONE DETECTED   Benzodiazepines NONE DETECTED NONE DETECTED   Amphetamines NONE DETECTED NONE DETECTED   Tetrahydrocannabinol POSITIVE (A) NONE DETECTED   Barbiturates NONE DETECTED NONE DETECTED    Comment: (NOTE) DRUG SCREEN FOR MEDICAL PURPOSES ONLY.  IF CONFIRMATION IS NEEDED FOR ANY PURPOSE, NOTIFY LAB WITHIN 5 DAYS.  LOWEST DETECTABLE LIMITS FOR URINE DRUG SCREEN Drug Class                     Cutoff (ng/mL) Amphetamine and metabolites    1000 Barbiturate and metabolites    200 Benzodiazepine                 200 Opiates and metabolites        300 Cocaine and metabolites        300 THC                            50 Performed at [facility], [address] [place], [place] [address]   Ethanol     Status: Abnormal   Collection Time: 09/10/22  2:30 AM  Result Value Ref  Range   Alcohol, Ethyl (B) 260 (H) <10 mg/dL    Comment: (NOTE) Lowest detectable limit for serum alcohol is 10 mg/dL.  For medical purposes only. Performed at [facility], [address] [place], [place] [address]   Lipase, blood     Status: None   Collection Time: 09/10/22  4:00 AM  Result Value Ref Range   Lipase 23 11 - 51 U/L    Comment: Performed at [facility], [address] [place], [place] [address]  Protime-INR     Status: None   Collection Time: 09/10/22  4:00 AM  Result Value Ref Range   Prothrombin Time 14.5 11.4 - 15.2 seconds   INR 1.1 0.8 - 1.2    Comment: (NOTE) INR goal varies based on device and disease states. Performed at [facility], [address], [place], [place] [address]   APTT     Status: None   Collection Time: 09/10/22  4:00 AM  Result Value Ref Range   aPTT 24 24 - 36 seconds    Comment: Performed at [facility], [address] [place], [place] [address]  CBC with Differential     Status: Abnormal   Collection Time: 09/10/22  4:00 AM  Result Value Ref Range   WBC 2.8 (L) 4.0 - 10.5 K/uL   RBC 3.36 (L) 3.87 - 5.11 MIL/uL   Hemoglobin 10.9 (L) 12.0 - 15.0 g/dL   HCT 64.3 (L) 32.9 - 51.8 %   MCV 97.3 80.0 - 100.0 fL   MCH 32.4 26.0 - 34.0 pg   MCHC 33.3 30.0 - 36.0 g/dL   RDW 84.1 66.0 - 63.0 %   Platelets 235 150 - 400 K/uL   nRBC 0.0 0.0 - 0.2 %   Neutrophils Relative % 30 %    Comment: Predominantly Lymphocytes Seen   Neutro Abs 0.8 (L) 1.7 - 7.7 K/uL   Lymphocytes Relative 62 %   Lymphs Abs 1.7 0.7 - 4.0 K/uL   Monocytes Relative 8 %   Monocytes Absolute 0.2 0.1 - 1.0 K/uL   Eosinophils Relative 0 %   Eosinophils Absolute 0.0 0.0 - 0.5 K/uL   Basophils Relative 0 %   Basophils Absolute 0.0 0.0 - 0.1  K/uL   Immature Granulocytes 0 %   Abs Immature Granulocytes 0.00 0.00 - 0.07 K/uL    Comment: Performed at [facility], [address], [place], [place] [address]  Acetaminophen level     Status: Abnormal   Collection Time: 09/10/22  5:00 AM  Result Value Ref Range   Acetaminophen (Tylenol), Serum <10 (L) 10 - 30 ug/mL    Comment: (NOTE) Therapeutic concentrations vary significantly. A range of 10-30 ug/mL  may be an effective concentration for many patients. However, some  are best treated at concentrations outside of this range. Acetaminophen concentrations >150 ug/mL at 4 hours after ingestion  and >50 ug/mL at 12 hours after ingestion are often associated with  toxic reactions.  Performed at [facility], [address], [place], [place] [address]   Salicylate level     Status: Abnormal   Collection Time: 09/10/22  5:00 AM  Result Value Ref Range   Salicylate Lvl <7.0 (L) 7.0 - 30.0 mg/dL    Comment: Performed at [facility], [address] [place], [place] [address]  Hepatitis panel, acute     Status: None   Collection Time: 09/10/22  5:00  AM  Result Value Ref Range   Hepatitis B Surface Ag NON REACTIVE NON REACTIVE   HCV Ab NON REACTIVE NON REACTIVE    Comment: (NOTE) Nonreactive HCV antibody screen is consistent with no HCV infections,  unless recent infection is suspected or other evidence exists to indicate HCV infection.     Hep A IgM NON REACTIVE NON REACTIVE   Hep B C IgM NON REACTIVE NON REACTIVE    Comment: Performed at [facility], [address], [place], [place] [address]  Ammonia     Status: Abnormal   Collection Time: 09/10/22 11:30 AM  Result Value Ref Range   Ammonia 115 (H) 9 - 35 umol/L    Comment: Performed at [facility], [address] [place], [place] [address]  Protime-INR     Status: None   Collection Time: 09/10/22 11:30 AM  Result Value Ref Range    Prothrombin Time 14.7 11.4 - 15.2 seconds   INR 1.1 0.8 - 1.2    Comment: (NOTE) INR goal varies based on device and disease states. Performed at [facility], [address] [place], [place] [address]   APTT     Status: Abnormal   Collection Time: 09/10/22 11:30 AM  Result Value Ref Range   aPTT 23 (L) 24 - 36 seconds    Comment: Performed at [facility], [address] [place], [place] [address]  Vitamin B12     Status: None   Collection Time: 09/10/22  2:42 PM  Result Value Ref Range   Vitamin B-12 858 180 - 914 pg/mL    Comment: (NOTE) This assay is not validated for testing neonatal or myeloproliferative syndrome specimens for Vitamin B12 levels. Performed at [facility], [address], [place], [place] [address]   Folate     Status: Abnormal   Collection Time: 09/10/22  2:42 PM  Result Value Ref Range   Folate 3.0 (L) >5.9 ng/mL    Comment: Performed at [facility], [address], [place], [place] [address]  Iron and TIBC     Status: Abnormal   Collection Time: 09/10/22  2:42 PM  Result Value Ref Range   Iron 166 28 - 170 ug/dL   TIBC 578 (L) 469 - 629 ug/dL   Saturation Ratios 75 (H) 10.4 - 31.8 %   UIBC 57 ug/dL    Comment: Performed at [facility], [address], [place], [place] [address]  Ferritin     Status: None   Collection Time: 09/10/22  2:42 PM  Result Value Ref Range   Ferritin 97 11 - 307 ng/mL    Comment: Performed at [facility], [address], [place], [place] [address]  Reticulocytes     Status: Abnormal   Collection Time: 09/10/22  2:42 PM  Result Value Ref Range   Retic Ct Pct 3.0 0.4 - 3.1 %   RBC. 3.23 (L) 3.87 - 5.11 MIL/uL   Retic Count, Absolute 96.9 19.0 - 186.0 K/uL   Immature Retic Fract 24.0 (H) 2.3 - 15.9 %    Comment: Performed at [facility], [address], [place], [place] [address]  TSH      Status: None   Collection Time: 09/10/22  2:42 PM  Result Value Ref Range   TSH 0.867 0.350 - 4.500 uIU/mL    Comment: Performed by a 3rd Generation assay with a functional sensitivity of <=  0.01 uIU/mL. Performed at [facility], [address] [place], [place] [address]     Current Facility-Administered Medications  Medication Dose Route Frequency Provider Last Rate Last Admin   enoxaparin (LOVENOX) injection 40 mg  40 mg Subcutaneous Q24H Russella Dar, NP       escitalopram (LEXAPRO) tablet 10 mg  10 mg Oral Daily , , MD       gabapentin (NEURONTIN) capsule 200 mg  200 mg Oral BID , , MD       lactulose (CHRONULAC) 10 GM/15ML solution 10 g  10 g Oral BID Russella Dar, NP   10 g at 09/10/22 1426   LORazepam (ATIVAN) tablet 1 mg  1 mg Oral Q4H PRN Russella Dar, NP   1 mg at 09/10/22 1447   pantoprazole (PROTONIX) injection 40 mg  40 mg Intravenous Q12H Russella Dar, NP   40 mg at 09/10/22 1100    Musculoskeletal: Strength & Muscle Tone: within normal limits Gait & Station: normal Patient leans: N/A    Psychiatric Specialty Exam:  Presentation  General Appearance:  Appropriate for Environment  Eye Contact: Good  Speech: Normal Rate  Speech Volume: Decreased  Handedness: Right   Mood and Affect  Mood: Dysphoric  Affect: Constricted   Thought Process  Thought Processes: Linear  Descriptions of Associations:Intact  Orientation:Full (Time, Place and Person)  Thought Content:Logical  History of Schizophrenia/Schizoaffective disorder:No data recorded Duration of Psychotic Symptoms:No data recorded Hallucinations:Hallucinations: None  Ideas of Reference:None  Suicidal Thoughts:Suicidal Thoughts: Yes, Passive SI Passive Intent and/or Plan: Without Plan  Homicidal Thoughts:Homicidal Thoughts: No   Sensorium  Memory: Immediate Good; Recent Good; Remote  Good  Judgment: Intact  Insight: Fair   Chartered certified accountant: Fair  Attention Span: Fair  Recall: Fair  Fund of Knowledge: Fair  Language: Fair   Psychomotor Activity  Psychomotor Activity: Psychomotor Activity: Decreased   Assets  Assets: Desire for Improvement; Communication Skills   Sleep  Sleep: Sleep: Poor   Physical Exam: Physical Exam Review of Systems  Psychiatric/Behavioral:  Positive for depression, substance abuse and suicidal ideas. Negative for hallucinations and memory loss. The patient is nervous/anxious and has insomnia.    Blood pressure 112/83, pulse 77, temperature 98.7 F (37.1 C), temperature source Oral, resp. rate 17, SpO2 100 %. There is no height or weight on file to calculate BMI.  Treatment Plan Summary: 37 year old with long history of alcohol use disorder, depression and anxiety who was admitted due to suicidal thoughts. Patient reports worsening depression, anxiety and numerous stressors in her life. At the moment, she is unable to contract for safety and will benefit from inpatient psychiatric admission after she is medically stabilized.  Plan/Recommendations: -Continue 1:1 sitter for safety -Continue Lorazepam 1 mg Q4H prn for alcohol withdrawal -Add Gabapentin 200 mg twice daily for mood/anxiety/alcohol withdrawal -Add Lexapro 10 mg daily for anxiety/depression -TOC consult to facilitate inpatient psychiatric admission after patient is medically stabilized  Disposition: Recommend psychiatric Inpatient admission when medically cleared. Supportive therapy provided about ongoing stressors. Psychiatric service will follow the patient  Thedore Mins, MD 09/10/2022 4:53 PM
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
## 2022-09-10 NOTE — ED Notes (Signed)
Labs sent

## 2022-09-10 NOTE — Consult Note (Signed)
[facility] Gastroenterology Consult  Referring Provider: ER Primary Care Physician:  Pcp, No Primary Gastroenterologist: Gentry Fitz  Reason for Consultation: Abnormal LFTs, suspect acute alcoholic hepatitis  HPI: Maureen Torres is a 37 y.o. female was brought to the ED for suicidal ideations, police called by her children for the same.  Patient states she drank 2 glasses of wine yesterday and took 1 or 2 pills of Tylenol. Complains of right upper quadrant tenderness that radiates to the back. Intermittently complains of mild acid reflux and heartburn, takes over-the-counter PPI as needed, denies difficulty swallowing or pain on swallowing, denies unintentional weight loss or loss of appetite. Denies vomiting blood, need for paracentesis/ascites in the past. States she is intermittently confused, denies history of hepatic encephalopathy. Denies prior history of liver disease.   She has prior history of pancreatitis. Enteroscopy, Dr. Leonard Downing 04/06/2018: Normal esophagus, normal stomach, unable to reach and intubate jejunal anastomosis and distal duodenum Past Medical History:  Diagnosis Date   Acid reflux     Past Surgical History:  Procedure Laterality Date   CHOLECYSTECTOMY     rous and y      Prior to Admission medications   Medication Sig Start Date End Date Taking? Authorizing Provider  acetaminophen (TYLENOL) 500 MG tablet Take 1,000 mg by mouth every 6 (six) hours as needed for mild pain.   Yes [provider]  GLUTAMINE PO Take 1 Dose by mouth daily.   Yes [provider]  medroxyPROGESTERone Acetate 150 MG/ML SUSY Inject 1 mL into the muscle every 3 (three) months. 02/25/21  Yes [provider]  Probiotic Product (PROBIOTIC PO) Take 1 capsule by mouth daily.   Yes [provider]    Current Facility-Administered Medications  Medication Dose Route Frequency Provider Last Rate Last Admin   acetaminophen (TYLENOL) tablet 650 mg  650 mg Oral  Q6H PRN Howerter, Justin B, DO   650 mg at 09/10/22 0981   Or   acetaminophen (TYLENOL) suppository 650 mg  650 mg Rectal Q6H PRN Howerter, Justin B, DO       Current Outpatient Medications  Medication Sig Dispense Refill   acetaminophen (TYLENOL) 500 MG tablet Take 1,000 mg by mouth every 6 (six) hours as needed for mild pain.     GLUTAMINE PO Take 1 Dose by mouth daily.     medroxyPROGESTERone Acetate 150 MG/ML SUSY Inject 1 mL into the muscle every 3 (three) months.     Probiotic Product (PROBIOTIC PO) Take 1 capsule by mouth daily.      Allergies as of 09/10/2022 - Review Complete 09/10/2022  Allergen Reaction Noted   Aspirin Other (See Comments) 11/25/2010   Ibuprofen Other (See Comments) 12/20/2010   Nsaids Other (See Comments) 02/11/2014    Family History  Problem Relation Age of Onset   Hypertension Other    Pancreatitis Neg Hx     Social History   Socioeconomic History   Marital status: Single    Spouse name: Not on file   Number of children: Not on file   Years of education: Not on file   Highest education level: Not on file  Occupational History   Not on file  Tobacco Use   Smoking status: Former    Types: Cigarettes   Smokeless tobacco: Never  Vaping Use   Vaping Use: Every day  Substance and Sexual Activity   Alcohol use: Yes    Alcohol/week: 4.0 standard drinks of alcohol    Types: 4 Glasses of wine per  week   Drug use: Never   Sexual activity: Not on file  Other Topics Concern   Not on file  Social History Narrative   Not on file   Social Determinants of Health   Financial Resource Strain: Not on file  Food Insecurity: Not on file  Transportation Needs: Not on file  Physical Activity: Not on file  Stress: Not on file  Social Connections: Not on file  Intimate Partner Violence: Not on file    Review of Systems:As per HPI  Physical Exam: Vital signs in last 24 hours: Temp:  [98 F (36.7 C)] 98 F (36.7 C) (06/29 0122) Pulse Rate:   [119] 119 (06/29 0122) Resp:  [20] 20 (06/29 0122) BP: (125)/(90) 125/90 (06/29 0122) SpO2:  [97 %] 97 % (06/29 0122)    General:   Alert,  Well-developed, well-nourished, pleasant and cooperative in NAD Head:  Normocephalic and atraumatic. Eyes:  Sclera clear, no icterus.   Conjunctiva pink. Ears:  Normal auditory acuity. Nose:  No deformity, discharge,  or lesions. Mouth:  No deformity or lesions.  Oropharynx pink & moist. Neck:  Supple; no masses or thyromegaly. Lungs:  Clear throughout to auscultation.   No wheezes, crackles, or rhonchi. No acute distress. Heart:  Regular rate and rhythm; no murmurs, clicks, rubs,  or gallops. Extremities:  Without clubbing or edema. Neurologic:  Alert and  oriented x4;  grossly normal neurologically. Skin:  Intact without significant lesions or rashes. Psych:  Alert and cooperative. Normal mood and affect. Abdomen:  Soft, mild right upper quadrant tenderness and nondistended. No masses, hepatosplenomegaly or hernias noted. Normal bowel sounds, without guarding, and without rebound.         Lab Results: Recent Labs    09/10/22 0400  WBC 2.8*  HGB 10.9*  HCT 32.7*  PLT 235   BMET Recent Labs    09/10/22 0145  NA 141  K 3.8  CL 113*  CO2 19*  GLUCOSE 107*  BUN 6  CREATININE 0.73  CALCIUM 8.4*   LFT Recent Labs    09/10/22 0145  PROT 7.4  ALBUMIN 3.7  AST 699*  ALT 93*  ALKPHOS 130*  BILITOT 0.6   PT/INR Recent Labs    09/10/22 0400  LABPROT 14.5  INR 1.1    Studies/Results: US Abdomen Limited RUQ (LIVER/GB)  Result Date: 09/10/2022 CLINICAL DATA:  [id number] with history of LFT elevation. EXAM: ULTRASOUND ABDOMEN LIMITED RIGHT UPPER QUADRANT COMPARISON:  CT with IV contrast 02/15/2021 FINDINGS: Gallbladder: Surgically absent. There was no elicited positive right upper abdominal tenderness. Common bile duct: Diameter: 2.3 mm.  No intrahepatic biliary prominence. Liver: No focal lesion identified. There is increased  parenchymal echogenicity suggesting at least mild steatosis. Portal vein is patent on color Doppler imaging with normal direction of blood flow towards the liver. Other: No right quadrant ascites. IMPRESSION: 1. Status post cholecystectomy. No biliary dilatation. 2. Mild hepatic steatosis. Electronically Signed   By: Almira Bar M.D.   On: 09/10/2022 05:30    Impression: Admitted for suicidal ideation  Abnormal LFTs, T. bili 0.6/AST 699/ALT 93/ALP 130?  Alcoholic hepatitis Hepatic discriminant function 12  Blood alcohol 260 on admission  Normal PT/INR 14.5/1.1, no evidence of encephalopathy  U tox positive for THC  Ultrasound shows mild hepatic steatosis, postcholecystectomy, no biliary dilatation  Normal acetaminophen and salicylate level  Normocytic anemia, hemoglobin 10.9, MCV 97.3, platelet 235 Serology negative for hepatitis B, hepatitis C and hepatitis A  Plan: Abnormal LFTs likely  related to acute alcoholic hepatitis without evidence of encephalopathy or coagulopathy. Hepatic discriminant function less than 32, not a candidate for prednisolone treatment. Recommend monitoring, if LFTs trend towards normal/trend down, okay to DC home from GI standpoint. Recommend supportive management. Please recall GI if needed.   LOS: 0 days   Kerin Salen, MD  09/10/2022, 9:16 AM

## 2022-09-10 NOTE — ED Notes (Signed)
Sitter at bedside with pt.

## 2022-09-10 NOTE — ED Notes (Signed)
ED TO INPATIENT HANDOFF REPORT  Name/Age/Gender Maureen Torres 37 y.o. female  Code Status    Code Status Orders  (From admission, onward)           Start     Ordered   09/10/22 0614  Full code  Continuous       Question:  By:  Answer:  Consent: discussion documented in EHR   09/10/22 0614           Code Status History     Date Active Date Inactive Code Status Order ID Comments User Context   09/10/2022 0321 09/10/2022 0614 Full Code [id number]  Garlon Hatchet, PA-C ED   02/16/2021 0730 02/20/2021 2000 Full Code [id number]  Bobette Mo, MD ED   10/22/2020 0325 10/25/2020 2352 Full Code [id number]  Eduard Clos, MD ED       Home/SNF/Other Home  Chief Complaint Acute alcoholic hepatitis [K70.10]  Level of Care/Admitting Diagnosis ED Disposition     ED Disposition  Admit   Condition  --   Comment  Hospital Area: [facility] [[id number]]  Level of Care: Telemetry [5]  Admit to tele based on following criteria: Monitor for Ischemic changes  May admit patient to [facility] or [facility] if equivalent level of care is available:: No  Covid Evaluation: Asymptomatic - no recent exposure (last 10 days) testing not required  Diagnosis: Acute alcoholic hepatitis [571.1.ICD-9-CM]  Admitting Physician: Angie Fava [[id number]]  Attending Physician: Angie Fava [[id number]]  Certification:: I certify this patient will need inpatient services for at least 2 midnights  Estimated Length of Stay: 2          Medical History Past Medical History:  Diagnosis Date   Acid reflux     Allergies Allergies  Allergen Reactions   Aspirin Other (See Comments)    Stomach ulcer   Ibuprofen Other (See Comments)    Gastric Bypass--advised to avoid   Nsaids Other (See Comments)    Gastric bypass Hx gastric bypass     IV Location/Drains/Wounds Patient Lines/Drains/Airways Status     Active Line/Drains/Airways     Name Placement  date Placement time Site Days   Peripheral IV 09/10/22 Left;Posterior Hand 09/10/22  0410  Hand  less than 1            Labs/Imaging Results for orders placed or performed during the hospital encounter of 09/10/22 (from the past 48 hour(s))  Comprehensive metabolic panel     Status: Abnormal   Collection Time: 09/10/22  1:45 AM  Result Value Ref Range   Sodium 141 135 - 145 mmol/L   Potassium 3.8 3.5 - 5.1 mmol/L   Chloride 113 (H) 98 - 111 mmol/L   CO2 19 (L) 22 - 32 mmol/L   Glucose, Bld 107 (H) 70 - 99 mg/dL    Comment: Glucose reference range applies only to samples taken after fasting for at least 8 hours.   BUN 6 6 - 20 mg/dL   Creatinine, Ser 3.47 0.44 - 1.00 mg/dL   Calcium 8.4 (L) 8.9 - 10.3 mg/dL   Total Protein 7.4 6.5 - 8.1 g/dL   Albumin 3.7 3.5 - 5.0 g/dL   AST 425 (H) 15 - 41 U/L   ALT 93 (H) 0 - 44 U/L   Alkaline Phosphatase 130 (H) 38 - 126 U/L   Total Bilirubin 0.6 0.3 - 1.2 mg/dL   GFR, Estimated >95 >63 mL/min    Comment: (NOTE) Calculated  using the CKD-EPI Creatinine Equation (2021)    Anion gap 9 5 - 15    Comment: Performed at [facility], [address], [place], [place] [address]  hCG, serum, qualitative     Status: None   Collection Time: 09/10/22  1:45 AM  Result Value Ref Range   Preg, Serum NEGATIVE NEGATIVE    Comment:        THE SENSITIVITY OF THIS METHODOLOGY IS >10 mIU/mL. Performed at [facility], [address], [place], [place] [address]   Rapid urine drug screen (hospital performed)     Status: Abnormal   Collection Time: 09/10/22  2:02 AM  Result Value Ref Range   Opiates NONE DETECTED NONE DETECTED   Cocaine NONE DETECTED NONE DETECTED   Benzodiazepines NONE DETECTED NONE DETECTED   Amphetamines NONE DETECTED NONE DETECTED   Tetrahydrocannabinol POSITIVE (A) NONE DETECTED   Barbiturates NONE DETECTED NONE DETECTED    Comment: (NOTE) DRUG SCREEN FOR MEDICAL PURPOSES ONLY.  IF  CONFIRMATION IS NEEDED FOR ANY PURPOSE, NOTIFY LAB WITHIN 5 DAYS.  LOWEST DETECTABLE LIMITS FOR URINE DRUG SCREEN Drug Class                     Cutoff (ng/mL) Amphetamine and metabolites    1000 Barbiturate and metabolites    200 Benzodiazepine                 200 Opiates and metabolites        300 Cocaine and metabolites        300 THC                            50 Performed at [facility], [address] [place], [place] [address]   Ethanol     Status: Abnormal   Collection Time: 09/10/22  2:30 AM  Result Value Ref Range   Alcohol, Ethyl (B) 260 (H) <10 mg/dL    Comment: (NOTE) Lowest detectable limit for serum alcohol is 10 mg/dL.  For medical purposes only. Performed at [facility], [address], [place], [place] [address]   Lipase, blood     Status: None   Collection Time: 09/10/22  4:00 AM  Result Value Ref Range   Lipase 23 11 - 51 U/L    Comment: Performed at [facility], [address] [place], [place] [address]  Protime-INR     Status: None   Collection Time: 09/10/22  4:00 AM  Result Value Ref Range   Prothrombin Time 14.5 11.4 - 15.2 seconds   INR 1.1 0.8 - 1.2    Comment: (NOTE) INR goal varies based on device and disease states. Performed at [facility], [address], [place], [place] [address]   APTT     Status: None   Collection Time: 09/10/22  4:00 AM  Result Value Ref Range   aPTT 24 24 - 36 seconds    Comment: Performed at [facility], [address] [place], [place] [address]  CBC with Differential     Status: Abnormal   Collection Time: 09/10/22  4:00 AM  Result Value Ref Range   WBC 2.8 (L) 4.0 - 10.5 K/uL   RBC 3.36 (L) 3.87 - 5.11 MIL/uL   Hemoglobin 10.9 (L) 12.0 - 15.0 g/dL   HCT 52.8 (L) 41.3 - 24.4 %   MCV 97.3  80.0 - 100.0 fL   MCH 32.4 26.0 - 34.0 pg   MCHC 33.3 30.0 - 36.0 g/dL   RDW 40.9 81.1 - 91.4 %   Platelets 235 150  - 400 K/uL   nRBC 0.0 0.0 - 0.2 %   Neutrophils Relative % 30 %    Comment: Predominantly Lymphocytes Seen   Neutro Abs 0.8 (L) 1.7 - 7.7 K/uL   Lymphocytes Relative 62 %   Lymphs Abs 1.7 0.7 - 4.0 K/uL   Monocytes Relative 8 %   Monocytes Absolute 0.2 0.1 - 1.0 K/uL   Eosinophils Relative 0 %   Eosinophils Absolute 0.0 0.0 - 0.5 K/uL   Basophils Relative 0 %   Basophils Absolute 0.0 0.0 - 0.1 K/uL   Immature Granulocytes 0 %   Abs Immature Granulocytes 0.00 0.00 - 0.07 K/uL    Comment: Performed at [facility], [address], [place], [place] [address]  Acetaminophen level     Status: Abnormal   Collection Time: 09/10/22  5:00 AM  Result Value Ref Range   Acetaminophen (Tylenol), Serum <10 (L) 10 - 30 ug/mL    Comment: (NOTE) Therapeutic concentrations vary significantly. A range of 10-30 ug/mL  may be an effective concentration for many patients. However, some  are best treated at concentrations outside of this range. Acetaminophen concentrations >150 ug/mL at 4 hours after ingestion  and >50 ug/mL at 12 hours after ingestion are often associated with  toxic reactions.  Performed at [facility], [address] [place], [place] [address]   Salicylate level     Status: Abnormal   Collection Time: 09/10/22  5:00 AM  Result Value Ref Range   Salicylate Lvl <7.0 (L) 7.0 - 30.0 mg/dL    Comment: Performed at [facility], [address] [place], [place] [address]  Hepatitis panel, acute     Status: None   Collection Time: 09/10/22  5:00 AM  Result Value Ref Range   Hepatitis B Surface Ag NON REACTIVE NON REACTIVE   HCV Ab NON REACTIVE NON REACTIVE    Comment: (NOTE) Nonreactive HCV antibody screen is consistent with no HCV infections,  unless recent infection is suspected or other evidence exists to indicate HCV infection.     Hep A IgM NON REACTIVE NON REACTIVE   Hep B C IgM NON REACTIVE NON REACTIVE     Comment: Performed at [facility], [address], [place], [place] [address]   DG HIP UNILAT WITH PELVIS 2-3 VIEWS LEFT  Result Date: 09/10/2022 CLINICAL DATA:  37 year old female history of generalized hip pain. EXAM: DG HIP (WITH OR WITHOUT PELVIS) 2-3V LEFT COMPARISON:  No priors. FINDINGS: There is no evidence of hip fracture or dislocation. There is no evidence of arthropathy or other focal bone abnormality. IMPRESSION: Negative. Electronically Signed   By: Trudie Reed M.D.   On: 09/10/2022 10:14   US Abdomen Limited RUQ (LIVER/GB)  Result Date: 09/10/2022 CLINICAL DATA:  [id number] with history of LFT elevation. EXAM: ULTRASOUND ABDOMEN LIMITED RIGHT UPPER QUADRANT COMPARISON:  CT with IV contrast 02/15/2021 FINDINGS: Gallbladder: Surgically absent. There was no elicited positive right upper abdominal tenderness. Common bile duct: Diameter: 2.3 mm.  No intrahepatic biliary prominence. Liver: No focal lesion identified. There is increased parenchymal echogenicity suggesting at least mild steatosis. Portal vein is patent on color Doppler imaging with normal direction of blood flow towards the liver. Other: No right quadrant ascites. IMPRESSION: 1. Status  post cholecystectomy. No biliary dilatation. 2. Mild hepatic steatosis. Electronically Signed   By: Almira Bar M.D.   On: 09/10/2022 05:30    Pending Labs Unresulted Labs (From admission, onward)     Start     Ordered   09/11/22 0500  Hepatic function panel  Tomorrow morning,   R        09/10/22 0934   09/10/22 1149  Vitamin B12  (Anemia Panel (PNL))  Once,   R        09/10/22 1148   09/10/22 1149  Folate  (Anemia Panel (PNL))  Once,   R        09/10/22 1148   09/10/22 1149  Iron and TIBC  (Anemia Panel (PNL))  Once,   R        09/10/22 1148   09/10/22 1149  Ferritin  (Anemia Panel (PNL))  Once,   R        09/10/22 1148   09/10/22 1149  Reticulocytes  (Anemia Panel (PNL))  Once,   R        09/10/22 1148   09/10/22 1149  TSH   Once,   R        09/10/22 1148   09/10/22 0943  APTT  Once,   R        09/10/22 0942   09/10/22 0942  Protime-INR  Once,   R        09/10/22 0942   09/10/22 0941  HIV-1 RNA quant-no reflex-bld  Once,   R        09/10/22 0940   09/10/22 0938  Ammonia  Once,   R        09/10/22 0937   09/10/22 0935  AntiMicrosomal Ab-Liver / Kidney  Once,   R        09/10/22 0934   09/10/22 0934  Anti-smooth muscle antibody, IgG  Once,   R        09/10/22 0934   09/10/22 0933  ANA w/Reflex if Positive  Once,   R        09/10/22 0934            Vitals/Pain Today's Vitals   09/10/22 0122 09/10/22 0143 09/10/22 1107  BP: (!) 125/90  106/78  Pulse: (!) 119  73  Resp: 20  19  Temp: 98 F (36.7 C)  99.1 F (37.3 C)  TempSrc: Oral  Oral  SpO2: 97%  100%  PainSc:  7      Isolation Precautions No active isolations  Medications Medications  acetaminophen (TYLENOL) tablet 650 mg (650 mg Oral Given 09/10/22 0739)    Or  acetaminophen (TYLENOL) suppository 650 mg ( Rectal See Alternative 09/10/22 0739)  pantoprazole (PROTONIX) injection 40 mg (40 mg Intravenous Given 09/10/22 1100)  enoxaparin (LOVENOX) injection 40 mg (40 mg Subcutaneous Not Given 09/10/22 1117)  LORazepam (ATIVAN) tablet 1 mg (1 mg Oral Given 09/10/22 0224)    Mobility walks
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
## 2022-09-10 NOTE — Progress Notes (Signed)
  Carryover admission to the Day Admitter.  I discussed this case with the EDP, Sharilyn Sites, PA.  Per these discussions:   This is a 37 year old female who is being admitted with suspected acute alcoholic hepatitis.  She has been recently going through some life stressors, prompting her to significantly increase her daily consumption of alcohol.  Her labs in the ED today were notable for AST/ALT in the 700s, but without significant elevation in total bilirubin or alkaline phosphatase.  Lipase normal.  Abdominal ultrasound showed fatty liver, but no evidence to suggest acute cholecystitis or any evidence of choledocholithiasis.  INR nonelevated.  She has previously followed with Dr. Dulce S[facility] gastroenterology.  EDP is sent a secure chat message to [facility] GI requesting formal consultation this morning.  I have placed an order for Inpatient admission to med/tele for further evaluation management of the above.  I have placed some additional preliminary admit orders via the adult multi-morbid admission order set.  I will defer evaluation of discriminant function and potential initiation of steroids to the admitting hospitalist.    Newton Pigg, DO Hospitalist

## 2022-09-10 NOTE — Hospital Course (Signed)
37 year old female with history of alcohol abuse, Roux-en-Y gastric bypass, obesity and gastroesophageal reflux disease brought to [facility] emergency department by [facility] Department for suicidal ideation.  Upon evaluation in the emergency department patient was noted to have significant transaminitis concerning for acute alcoholic hepatitis.  Considering patient's ongoing suicidal ideation and alcoholic hepatitis the hospitalist group was called to assess the patient for admission to the hospital.    Dr. Pati Gallo with [facility] gastroenterology was consulted for the alcoholic hepatitis and supportive care was recommended.  Psychiatry was additionally consulted considering patient's suicidal ideation and one-on-one sitter for safety observation was recommended with eventual discharge to a psychiatric inpatient bed at behavioral health.  Patient was monitored for any evidence of alcohol withdrawal.
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
## 2022-09-11 DIAGNOSIS — F17210 Nicotine dependence, cigarettes, uncomplicated: Secondary | ICD-10-CM

## 2022-09-11 DIAGNOSIS — R45851 Suicidal ideations: Secondary | ICD-10-CM

## 2022-09-11 DIAGNOSIS — F10930 Alcohol use, unspecified with withdrawal, uncomplicated: Secondary | ICD-10-CM | POA: Diagnosis not present

## 2022-09-11 DIAGNOSIS — F322 Major depressive disorder, single episode, severe without psychotic features: Secondary | ICD-10-CM | POA: Diagnosis not present

## 2022-09-11 DIAGNOSIS — K7010 Alcoholic hepatitis without ascites: Secondary | ICD-10-CM | POA: Diagnosis not present

## 2022-09-11 LAB — CBC WITH DIFFERENTIAL/PLATELET
Abs Immature Granulocytes: 0 10*3/uL (ref 0.00–0.07)
Basophils Absolute: 0 10*3/uL (ref 0.0–0.1)
Basophils Relative: 1 %
Eosinophils Absolute: 0 10*3/uL (ref 0.0–0.5)
Eosinophils Relative: 1 %
HCT: 33.6 % — ABNORMAL LOW (ref 36.0–46.0)
Hemoglobin: 11.3 g/dL — ABNORMAL LOW (ref 12.0–15.0)
Immature Granulocytes: 0 %
Lymphocytes Relative: 41 %
Lymphs Abs: 1.8 10*3/uL (ref 0.7–4.0)
MCH: 32.4 pg (ref 26.0–34.0)
MCHC: 33.6 g/dL (ref 30.0–36.0)
MCV: 96.3 fL (ref 80.0–100.0)
Monocytes Absolute: 0.3 10*3/uL (ref 0.1–1.0)
Monocytes Relative: 7 %
Neutro Abs: 2.2 10*3/uL (ref 1.7–7.7)
Neutrophils Relative %: 50 %
Platelets: 216 10*3/uL (ref 150–400)
RBC: 3.49 MIL/uL — ABNORMAL LOW (ref 3.87–5.11)
RDW: 13.6 % (ref 11.5–15.5)
WBC: 4.4 10*3/uL (ref 4.0–10.5)
nRBC: 0 % (ref 0.0–0.2)

## 2022-09-11 LAB — HEPATIC FUNCTION PANEL
ALT: 91 U/L — ABNORMAL HIGH (ref 0–44)
AST: 135 U/L — ABNORMAL HIGH (ref 15–41)
Albumin: 3.4 g/dL — ABNORMAL LOW (ref 3.5–5.0)
Alkaline Phosphatase: 114 U/L (ref 38–126)
Bilirubin, Direct: 0.2 mg/dL (ref 0.0–0.2)
Indirect Bilirubin: 0.5 mg/dL (ref 0.3–0.9)
Total Bilirubin: 0.7 mg/dL (ref 0.3–1.2)
Total Protein: 6.6 g/dL (ref 6.5–8.1)

## 2022-09-11 LAB — BASIC METABOLIC PANEL
Anion gap: 9 (ref 5–15)
BUN: 7 mg/dL (ref 6–20)
CO2: 19 mmol/L — ABNORMAL LOW (ref 22–32)
Calcium: 8.6 mg/dL — ABNORMAL LOW (ref 8.9–10.3)
Chloride: 107 mmol/L (ref 98–111)
Creatinine, Ser: 0.66 mg/dL (ref 0.44–1.00)
GFR, Estimated: >60 mL/min (ref >60)
Glucose, Bld: 92 mg/dL (ref 70–99)
Potassium: 3.7 mmol/L (ref 3.5–5.1)
Sodium: 135 mmol/L (ref 135–145)

## 2022-09-11 LAB — MAGNESIUM: Magnesium: 1.9 mg/dL (ref 1.7–2.4)

## 2022-09-11 MED ORDER — ONDANSETRON 4 MG PO TBDP
4.0000 mg | ORAL_TABLET | Freq: Three times a day (TID) | ORAL | Status: DC | PRN
  Administered 2022-09-11 – 2022-09-12 (×4): 4 mg via ORAL
  Filled 2022-09-11 (×4): qty 1

## 2022-09-11 MED ORDER — ONDANSETRON HCL 4 MG/2ML IJ SOLN: 4.0000 mg | Freq: Four times a day (QID) | INTRAMUSCULAR | Status: DC | PRN

## 2022-09-11 MED ORDER — NICOTINE 21 MG/24HR TD PT24
21.0000 mg | MEDICATED_PATCH | TRANSDERMAL | Status: DC
  Administered 2022-09-11 – 2022-09-12 (×2): 21 mg via TRANSDERMAL
  Filled 2022-09-11 (×2): qty 1

## 2022-09-11 MED ORDER — PANTOPRAZOLE SODIUM 40 MG PO TBEC
40.0000 mg | DELAYED_RELEASE_TABLET | Freq: Every day | ORAL | Status: DC
  Administered 2022-09-11 – 2022-09-12 (×2): 40 mg via ORAL
  Filled 2022-09-11 (×2): qty 1

## 2022-09-11 NOTE — Assessment & Plan Note (Signed)
Abdominal pain improving Transaminases downtrending Now that LFTs are normalizing patient now medically cleared for transfer to behavioral health [facility]

## 2022-09-11 NOTE — Assessment & Plan Note (Signed)
.   Patient is being counseled daily on smoking cessation. . Providing patient with nicotine replacement therapy during this hospitalization.   

## 2022-09-11 NOTE — Progress Notes (Signed)
PROGRESS NOTE   Maureen Torres  WUJ:[id number] DOB: May 05, 1985 DOA: 09/10/2022 PCP: Maureen Torres   Date of Service: the patient was seen and examined on 09/11/2022  Brief Narrative:  37 year old female with history of alcohol abuse, Roux-en-Y gastric bypass, obesity and gastroesophageal reflux disease brought to [facility] emergency Torres by Maureen Torres for suicidal ideation.  Upon evaluation in the emergency Torres patient was noted to have significant transaminitis concerning for acute alcoholic hepatitis.  Considering patient's ongoing suicidal ideation and alcoholic hepatitis the hospitalist group was called to assess the patient for admission to the hospital.    Dr. Pati Torres with [facility] gastroenterology was consulted for the alcoholic hepatitis and supportive care was recommended.  Psychiatry was additionally consulted considering patient's suicidal ideation and one-on-one sitter for safety observation was recommended with eventual discharge to a psychiatric inpatient bed at behavioral health.  Patient was monitored for any evidence of alcohol withdrawal and received doses of as needed lorazepam for symptoms.  Over the course of the hospitalization patient's liver enzymes downtrended and abdominal pain resolved.  Patient was cleared from a medical standpoint for discharge and transferred to behavioral health Hospital.   Assessment and Plan: * Suicidal ideation Patient continuing to exhibit depressed mood with suicidal ideation Patient evaluated by psychiatry on date of admission and recommends consideration for admission to behavioral health Patient now medically cleared.  TOC notified, will discharge to behavioral health Hospital once bed available if patient continues to meet criteria for acceptance.   Continue one-to-one Recruitment consultant Continue Lexapro  Major depressive disorder, single episode, severe (HCC) Patient continuing to experience symptoms of ongoing  depressed mood and suicidal ideation Continuing Lexapro, see remainder of assessment and plan above  Acute alcoholic hepatitis Abdominal pain improving Transaminases downtrending Now that LFTs are normalizing patient now medically cleared for transfer to behavioral health Hospital  Alcohol withdrawal [facility]) Patient exhibiting mild tremors today consistent with very mild withdrawal Symptoms continue to be managed well with as needed benzodiazepines Counseling daily on cessation of alcohol use  Gastroesophageal reflux disease Continuing home regimen of daily PPI therapy.     Nicotine dependence, cigarettes, uncomplicated Patient is being counseled daily on smoking cessation. Providing patient with nicotine replacement therapy during this hospitalization.      Subjective:  Patient states that her abdominal pain is very mild and is improving since yesterday.  Patient complains of a very mild tremor.  Otherwise, patient has Torres complaints.  Physical Exam:  Vitals:   09/11/22 0446 09/11/22 0748 09/11/22 1715 09/11/22 1921  BP: 108/77 (!) 134/93 112/73 110/73  Pulse: 68 89 82 65  Resp: 18 20 20 16   Temp: 98.4 F (36.9 C) 98.2 F (36.8 C) 97.9 F (36.6 C) 97.9 F (36.6 C)  TempSrc: Oral Oral  Oral  SpO2: 100% 100% 100% 100%  Weight:        Constitutional: Awake alert and oriented x3, Torres associated distress.   Skin: Torres rashes, Torres lesions, good skin turgor noted. Eyes: Pupils are equally reactive to light.  Torres evidence of scleral icterus or conjunctival pallor.  ENMT: Moist mucous membranes noted.  Posterior pharynx clear of any exudate or lesions.   Respiratory: clear to auscultation bilaterally, Torres wheezing, Torres crackles. Normal respiratory effort. Torres accessory muscle use.  Cardiovascular: Regular rate and rhythm, Torres murmurs / rubs / gallops. Torres extremity edema. 2+ pedal pulses. Torres carotid bruits.  Abdomen: Abdomen is soft and nontender.  Torres evidence of intra-abdominal  masses.  Positive  bowel sounds noted in all quadrants.   Musculoskeletal: Torres joint deformity upper and lower extremities. Good ROM, Torres contractures. Normal muscle tone.  Neuro: Mild resting tremor.  Torres evidence of asterixis.   Data Reviewed:  I have personally reviewed and interpreted labs, imaging.  Significant findings are   CBC: Recent Labs  Lab 09/10/22 0400 09/11/22 0713  WBC 2.8* 4.4  NEUTROABS 0.8* 2.2  HGB 10.9* 11.3*  HCT 32.7* 33.6*  MCV 97.3 96.3  PLT 235 216   Basic Metabolic Panel: Recent Labs  Lab 09/10/22 0145 09/11/22 0713  NA 141 135  K 3.8 3.7  CL 113* 107  CO2 19* 19*  GLUCOSE 107* 92  BUN 6 7  CREATININE 0.73 0.66  CALCIUM 8.4* 8.6*  MG  --  1.9   GFR: CrCl cannot be calculated (Unknown ideal weight.). Liver Function Tests: Recent Labs  Lab 09/10/22 0145 09/11/22 0713  AST 699* 135*  ALT 93* 91*  ALKPHOS 130* 114  BILITOT 0.6 0.7  PROT 7.4 6.6  ALBUMIN 3.7 3.4*    Coagulation Profile: Recent Labs  Lab 09/10/22 0400 09/10/22 1130  INR 1.1 1.1       Code Status:  Full code.  Code status decision has been confirmed with: patient    Severity of Illness:  The appropriate patient status for this patient is INPATIENT. Inpatient status is judged to be reasonable and necessary in order to provide the required intensity of service to ensure the patient's safety. The patient's presenting symptoms, physical exam findings, and initial radiographic and laboratory data in the context of their chronic comorbidities is felt to place them at high risk for further clinical deterioration. Furthermore, it is not anticipated that the patient will be medically stable for discharge from the hospital within 2 midnights of admission.   * I certify that at the point of admission it is my clinical judgment that the patient will require inpatient hospital care spanning beyond 2 midnights from the point of admission due to high intensity of service, high  risk for further deterioration and high frequency of surveillance required.*  Time spent:  50 minutes  Author:  Marinda Elk MD  09/11/2022 8:59 PM

## 2022-09-11 NOTE — Assessment & Plan Note (Signed)
Patient continuing to experience symptoms of ongoing depressed mood and suicidal ideation Continuing Lexapro, see remainder of assessment and plan above

## 2022-09-11 NOTE — Assessment & Plan Note (Signed)
Continuing home regimen of daily PPI therapy.  

## 2022-09-11 NOTE — Assessment & Plan Note (Addendum)
Patient continuing to exhibit depressed mood with suicidal ideation Patient evaluated by psychiatry on date of admission and recommends consideration for admission to behavioral health Patient now medically cleared.  TOC notified, will discharge to behavioral health Hospital once bed available if patient continues to meet criteria for acceptance.   Continue one-to-one Recruitment consultant Continue Lexapro

## 2022-09-11 NOTE — Assessment & Plan Note (Signed)
Patient exhibiting mild tremors today consistent with very mild withdrawal Symptoms continue to be managed well with as needed benzodiazepines Counseling daily on cessation of alcohol use

## 2022-09-11 NOTE — Plan of Care (Signed)
  Problem: Clinical Measurements: Goal: Will remain free from infection Outcome: Progressing   Problem: Nutrition: Goal: Adequate nutrition will be maintained Outcome: Progressing   Problem: Safety: Goal: Ability to remain free from injury will improve Outcome: Progressing   

## 2022-09-12 ENCOUNTER — Other Ambulatory Visit: Payer: Self-pay

## 2022-09-12 ENCOUNTER — Inpatient Hospital Stay (HOSPITAL_COMMUNITY)
Admission: AD | Admit: 2022-09-12 | Discharge: 2022-09-19 | DRG: 885 | Disposition: A | Discharge status: Home / Self Care | Payer: Medicaid Other | Source: Intra-hospital | Attending: Psychiatry | Admitting: Psychiatry

## 2022-09-12 ENCOUNTER — Encounter (HOSPITAL_COMMUNITY): Payer: Self-pay | Admitting: Family

## 2022-09-12 DIAGNOSIS — Z560 Unemployment, unspecified: Secondary | ICD-10-CM

## 2022-09-12 DIAGNOSIS — K219 Gastro-esophageal reflux disease without esophagitis: Secondary | ICD-10-CM | POA: Diagnosis present

## 2022-09-12 DIAGNOSIS — R45851 Suicidal ideations: Secondary | ICD-10-CM | POA: Diagnosis present

## 2022-09-12 DIAGNOSIS — F4310 Post-traumatic stress disorder, unspecified: Secondary | ICD-10-CM | POA: Diagnosis present

## 2022-09-12 DIAGNOSIS — Z818 Family history of other mental and behavioral disorders: Secondary | ICD-10-CM

## 2022-09-12 DIAGNOSIS — F909 Attention-deficit hyperactivity disorder, unspecified type: Secondary | ICD-10-CM | POA: Diagnosis present

## 2022-09-12 DIAGNOSIS — G4700 Insomnia, unspecified: Secondary | ICD-10-CM | POA: Diagnosis present

## 2022-09-12 DIAGNOSIS — Z79899 Other long term (current) drug therapy: Secondary | ICD-10-CM | POA: Diagnosis not present

## 2022-09-12 DIAGNOSIS — F411 Generalized anxiety disorder: Secondary | ICD-10-CM | POA: Diagnosis present

## 2022-09-12 DIAGNOSIS — Z9884 Bariatric surgery status: Secondary | ICD-10-CM | POA: Diagnosis not present

## 2022-09-12 DIAGNOSIS — K5904 Chronic idiopathic constipation: Secondary | ICD-10-CM | POA: Diagnosis present

## 2022-09-12 DIAGNOSIS — F322 Major depressive disorder, single episode, severe without psychotic features: Secondary | ICD-10-CM | POA: Diagnosis not present

## 2022-09-12 DIAGNOSIS — F1020 Alcohol dependence, uncomplicated: Secondary | ICD-10-CM | POA: Diagnosis present

## 2022-09-12 DIAGNOSIS — F10930 Alcohol use, unspecified with withdrawal, uncomplicated: Secondary | ICD-10-CM | POA: Diagnosis not present

## 2022-09-12 DIAGNOSIS — Z9152 Personal history of nonsuicidal self-harm: Secondary | ICD-10-CM

## 2022-09-12 DIAGNOSIS — F332 Major depressive disorder, recurrent severe without psychotic features: Secondary | ICD-10-CM | POA: Diagnosis present

## 2022-09-12 DIAGNOSIS — K7010 Alcoholic hepatitis without ascites: Secondary | ICD-10-CM | POA: Diagnosis not present

## 2022-09-12 DIAGNOSIS — F17210 Nicotine dependence, cigarettes, uncomplicated: Secondary | ICD-10-CM | POA: Diagnosis present

## 2022-09-12 LAB — RENAL FUNCTION PANEL
Albumin: 3.5 g/dL (ref 3.5–5.0)
Anion gap: 10 (ref 5–15)
BUN: 10 mg/dL (ref 6–20)
CO2: 21 mmol/L — ABNORMAL LOW (ref 22–32)
Calcium: 9.1 mg/dL (ref 8.9–10.3)
Chloride: 106 mmol/L (ref 98–111)
Creatinine, Ser: 0.74 mg/dL (ref 0.44–1.00)
GFR, Estimated: >60 mL/min (ref >60)
Glucose, Bld: 99 mg/dL (ref 70–99)
Phosphorus: 3.8 mg/dL (ref 2.5–4.6)
Potassium: 3.7 mmol/L (ref 3.5–5.1)
Sodium: 137 mmol/L (ref 135–145)

## 2022-09-12 LAB — CBC WITH DIFFERENTIAL/PLATELET
Abs Immature Granulocytes: 0 10*3/uL (ref 0.00–0.07)
Basophils Absolute: 0 10*3/uL (ref 0.0–0.1)
Basophils Relative: 1 %
Eosinophils Absolute: 0 10*3/uL (ref 0.0–0.5)
Eosinophils Relative: 1 %
HCT: 34.6 % — ABNORMAL LOW (ref 36.0–46.0)
Hemoglobin: 11.3 g/dL — ABNORMAL LOW (ref 12.0–15.0)
Immature Granulocytes: 0 %
Lymphocytes Relative: 41 %
Lymphs Abs: 1.7 10*3/uL (ref 0.7–4.0)
MCH: 32 pg (ref 26.0–34.0)
MCHC: 32.7 g/dL (ref 30.0–36.0)
MCV: 98 fL (ref 80.0–100.0)
Monocytes Absolute: 0.4 10*3/uL (ref 0.1–1.0)
Monocytes Relative: 9 %
Neutro Abs: 2.1 10*3/uL (ref 1.7–7.7)
Neutrophils Relative %: 48 %
Platelets: 213 10*3/uL (ref 150–400)
RBC: 3.53 MIL/uL — ABNORMAL LOW (ref 3.87–5.11)
RDW: 13.8 % (ref 11.5–15.5)
WBC: 4.2 10*3/uL (ref 4.0–10.5)
nRBC: 0 % (ref 0.0–0.2)

## 2022-09-12 LAB — HEPATIC FUNCTION PANEL
ALT: 69 U/L — ABNORMAL HIGH (ref 0–44)
AST: 60 U/L — ABNORMAL HIGH (ref 15–41)
Albumin: 3.5 g/dL (ref 3.5–5.0)
Alkaline Phosphatase: 109 U/L (ref 38–126)
Bilirubin, Direct: 0.1 mg/dL (ref 0.0–0.2)
Indirect Bilirubin: 0.5 mg/dL (ref 0.3–0.9)
Total Bilirubin: 0.6 mg/dL (ref 0.3–1.2)
Total Protein: 6.9 g/dL (ref 6.5–8.1)

## 2022-09-12 LAB — PHOSPHORUS: Phosphorus: 3.8 mg/dL (ref 2.5–4.6)

## 2022-09-12 LAB — ENA+DNA/DS+ANTICH+CENTRO+JO...
Anti JO-1: <0.2 AI (ref 0.0–0.9)
Centromere Ab Screen: <0.2 AI (ref 0.0–0.9)
Chromatin Ab SerPl-aCnc: <0.2 AI (ref 0.0–0.9)
ENA SM Ab Ser-aCnc: <0.2 AI (ref 0.0–0.9)
Ribonucleic Protein: 2.5 AI — ABNORMAL HIGH (ref 0.0–0.9)
SSA (Ro) (ENA) Antibody, IgG: <0.2 AI (ref 0.0–0.9)
SSB (La) (ENA) Antibody, IgG: <0.2 AI (ref 0.0–0.9)
Scleroderma (Scl-70) (ENA) Antibody, IgG: <0.2 AI (ref 0.0–0.9)
ds DNA Ab: <1 IU/mL (ref 0–9)

## 2022-09-12 LAB — ANA W/REFLEX IF POSITIVE: Anti Nuclear Antibody (ANA): POSITIVE — AB

## 2022-09-12 LAB — HIV-1 RNA QUANT-NO REFLEX-BLD
HIV 1 RNA Quant: <20 copies/mL
LOG10 HIV-1 RNA: UNDETERMINED log10copy/mL

## 2022-09-12 LAB — ANTI-MICROSOMAL ANTIBODY LIVER / KIDNEY: LKM1 Ab: 0.7 Units (ref 0.0–20.0)

## 2022-09-12 LAB — AMMONIA: Ammonia: 23 umol/L (ref 9–35)

## 2022-09-12 LAB — ANTI-SMOOTH MUSCLE ANTIBODY, IGG: F-Actin IgG: 6 Units (ref 0–19)

## 2022-09-12 LAB — MAGNESIUM: Magnesium: 1.9 mg/dL (ref 1.7–2.4)

## 2022-09-12 MED ORDER — FOLIC ACID 1 MG PO TABS: 1.0000 mg | ORAL_TABLET | Freq: Every day | ORAL | Status: AC

## 2022-09-12 MED ORDER — ESCITALOPRAM OXALATE 10 MG PO TABS
10.0000 mg | ORAL_TABLET | Freq: Every day | ORAL | Status: DC
  Administered 2022-09-13: 10 mg via ORAL
  Filled 2022-09-12 (×4): qty 1

## 2022-09-12 MED ORDER — POLYETHYLENE GLYCOL 3350 17 G PO PACK: 17.0000 g | PACK | Freq: Every day | ORAL | Status: DC | PRN

## 2022-09-12 MED ORDER — ESCITALOPRAM OXALATE 10 MG PO TABS: 10.0000 mg | ORAL_TABLET | Freq: Every day | ORAL | Status: DC

## 2022-09-12 MED ORDER — FOLIC ACID 1 MG PO TABS
1.0000 mg | ORAL_TABLET | Freq: Every day | ORAL | Status: DC
  Administered 2022-09-13 – 2022-09-19 (×7): 1 mg via ORAL
  Filled 2022-09-12 (×10): qty 1

## 2022-09-12 MED ORDER — VITAMIN B-1 100 MG PO TABS
100.0000 mg | ORAL_TABLET | Freq: Every day | ORAL | Status: DC
  Administered 2022-09-13 – 2022-09-19 (×7): 100 mg via ORAL
  Filled 2022-09-12 (×10): qty 1

## 2022-09-12 MED ORDER — MAGNESIUM HYDROXIDE 400 MG/5ML PO SUSP
30.0000 mL | Freq: Every day | ORAL | Status: DC | PRN
  Administered 2022-09-13 – 2022-09-15 (×2): 30 mL via ORAL
  Filled 2022-09-12 (×2): qty 30

## 2022-09-12 MED ORDER — NICOTINE 21 MG/24HR TD PT24
21.0000 mg | MEDICATED_PATCH | TRANSDERMAL | Status: DC
  Administered 2022-09-13 – 2022-09-19 (×7): 21 mg via TRANSDERMAL
  Filled 2022-09-12 (×11): qty 1

## 2022-09-12 MED ORDER — WHITE PETROLATUM EX OINT
TOPICAL_OINTMENT | CUTANEOUS | Status: AC
  Filled 2022-09-12: qty 5

## 2022-09-12 MED ORDER — LORAZEPAM 1 MG PO TABS
2.0000 mg | ORAL_TABLET | Freq: Three times a day (TID) | ORAL | Status: DC | PRN
  Administered 2022-09-12 – 2022-09-16 (×10): 2 mg via ORAL
  Filled 2022-09-12 (×10): qty 2

## 2022-09-12 MED ORDER — LORAZEPAM 2 MG/ML IJ SOLN: 2.0000 mg | Freq: Three times a day (TID) | INTRAMUSCULAR | Status: DC | PRN

## 2022-09-12 MED ORDER — PANTOPRAZOLE SODIUM 40 MG PO TBEC
40.0000 mg | DELAYED_RELEASE_TABLET | Freq: Every day | ORAL | Status: DC
  Administered 2022-09-13 – 2022-09-19 (×7): 40 mg via ORAL
  Filled 2022-09-12 (×11): qty 1

## 2022-09-12 MED ORDER — VITAMIN B-1 100 MG PO TABS: 100.0000 mg | ORAL_TABLET | Freq: Every day | ORAL | Status: AC

## 2022-09-12 MED ORDER — ACETAMINOPHEN 325 MG PO TABS
650.0000 mg | ORAL_TABLET | Freq: Four times a day (QID) | ORAL | Status: DC | PRN
  Administered 2022-09-12 – 2022-09-18 (×10): 650 mg via ORAL
  Filled 2022-09-12 (×10): qty 2

## 2022-09-12 MED ORDER — POLYETHYLENE GLYCOL 3350 17 G PO PACK: 17.0000 g | PACK | Freq: Every day | ORAL | 0 refills | Status: DC | PRN

## 2022-09-12 MED ORDER — PANTOPRAZOLE SODIUM 40 MG PO TBEC: 40.0000 mg | DELAYED_RELEASE_TABLET | Freq: Every day | ORAL | Status: DC

## 2022-09-12 MED ORDER — ALUM & MAG HYDROXIDE-SIMETH 200-200-20 MG/5ML PO SUSP
30.0000 mL | ORAL | Status: DC | PRN
  Administered 2022-09-14: 30 mL via ORAL
  Filled 2022-09-12: qty 30

## 2022-09-12 MED ORDER — POLYETHYLENE GLYCOL 3350 17 G PO PACK
17.0000 g | PACK | Freq: Every day | ORAL | Status: DC | PRN
  Administered 2022-09-13 – 2022-09-18 (×3): 17 g via ORAL
  Filled 2022-09-12 (×3): qty 1

## 2022-09-12 MED ORDER — NICOTINE 21 MG/24HR TD PT24: 21.0000 mg | MEDICATED_PATCH | TRANSDERMAL | 0 refills | Status: DC

## 2022-09-12 NOTE — Progress Notes (Signed)
Admission Note: Patient is a 37 year old female admitted to the unit voluntarily from [facility] for symptoms depression, suicidal ideation with a plan to jump off the bridge with her car.  Patient verbally contracts for safety while in the hospital.  Patient reports drinking too much due to her stressors.  BAL on admission is 260.  UDS positive for THC.  Reports pass SI attempts with history of self injurious behavior ( cutting).  Stated goal is mood stabilization and to stop having SI thoughts.  Patient is alert and oriented x 4.  Presents with a flat affect and depressed mood.  Admission plan of care reviewed, consent signed.  Skin and personal belongings completed.  Skin is dry and intact.  No contraband found.  Multiple tattoos noted around body. Nipples and nasal piercing noted.  Patient oriented to the unit, staff and room.  Verbalizes understanding of unit rules/protocols.  Routine safety checks initiated.  Offered support and encouragement as needed.  Patient is safe on the unit.

## 2022-09-12 NOTE — Discharge Summary (Signed)
Physician Discharge Summary   Patient: Maureen Torres MRN: [id number] DOB: 1985/05/13  Admit date:     09/10/2022  Discharge date: 09/12/22  Discharge Physician: Marinda Elk   PCP: Pcp, No   Recommendations at discharge:   Patient to abstain from alcohol use going forward Patient is been placed on daily proton pump inhibitor for gastroesophageal reflux disease Patient has been counseled on smoking cessation has been placed on nicotine patches for now Patient has been prescribed as needed MiraLAX for constipation.  Discharge Diagnoses: Principal Problem:   Suicidal ideation Active Problems:   Major depressive disorder, single episode, severe (HCC)   Acute alcoholic hepatitis   Alcohol withdrawal (HCC)   Gastroesophageal reflux disease   Nicotine dependence, cigarettes, uncomplicated  Resolved Problems:   * No resolved hospital problems. *   Hospital Course: 37 year old female with history of alcohol abuse, Roux-en-Y gastric bypass, obesity and gastroesophageal reflux disease brought to [facility] emergency department by [facility] for suicidal ideation.  Upon evaluation in the emergency department patient was noted to have significant transaminitis concerning for acute alcoholic hepatitis.  Considering patient's ongoing suicidal ideation and alcoholic hepatitis the hospitalist group was called to assess the patient for admission to the hospital.    Dr. Pati Gallo with [facility] gastroenterology was consulted for the alcoholic hepatitis and supportive care was recommended.  Psychiatry was additionally consulted considering patient's suicidal ideation and one-on-one sitter for safety observation was recommended with eventual discharge to a psychiatric inpatient bed at behavioral health.  Patient was monitored for any evidence of alcohol withdrawal and received doses of as needed lorazepam for symptoms.  Over the course of the hospitalization patient's liver  enzymes downtrended and abdominal pain resolved.  Patient was cleared from a medical standpoint for discharge and transferred to behavioral health Hospital.   Consultants: Dr. Jannifer Franklin with Psychiatry, Dr. Marca Ancona with Gastoenterology Procedures performed: None  Disposition:  [facility] Diet recommendation:  Regular diet  DISCHARGE MEDICATION: Allergies as of 09/12/2022       Reactions   Aspirin Other (See Comments)   Stomach ulcer   Ibuprofen Other (See Comments)   Gastric Bypass--advised to avoid   Nsaids Other (See Comments)   Gastric bypass Hx gastric bypass        Medication List     TAKE these medications    acetaminophen 500 MG tablet Commonly known as: TYLENOL Take 1,000 mg by mouth every 6 (six) hours as needed for mild pain.   escitalopram 10 MG tablet Commonly known as: LEXAPRO Take 1 tablet (10 mg total) by mouth daily. Start taking on: September 13, 2022   folic acid 1 MG tablet Commonly known as: FOLVITE Take 1 tablet (1 mg total) by mouth daily. Start taking on: September 13, 2022   GLUTAMINE PO Take 1 Dose by mouth daily.   medroxyPROGESTERone Acetate 150 MG/ML Susy Inject 1 mL into the muscle every 3 (three) months.   nicotine 21 mg/24hr patch Commonly known as: NICODERM CQ - dosed in mg/24 hours Place 1 patch (21 mg total) onto the skin daily.   pantoprazole 40 MG tablet Commonly known as: PROTONIX Take 1 tablet (40 mg total) by mouth daily. Start taking on: September 13, 2022   polyethylene glycol 17 g packet Commonly known as: MIRALAX / GLYCOLAX Take 17 g by mouth daily as needed for mild constipation.   PROBIOTIC PO Take 1 capsule by mouth daily.   thiamine 100 MG tablet Commonly known as: Vitamin  B-1 Take 1 tablet (100 mg total) by mouth daily. Start taking on: September 13, 2022        Follow-up Information     [facility]   Contact information: [address] [place]  [id number]                Discharge Exam: Ceasar Mons Weights   09/11/22 0424 09/12/22 0459  Weight: 75.2 kg 77.8 kg    Constitutional: Awake alert and oriented x3, no associated distress.   Respiratory: clear to auscultation bilaterally, no wheezing, no crackles. Normal respiratory effort. No accessory muscle use.  Cardiovascular: Regular rate and rhythm, no murmurs / rubs / gallops. No extremity edema. 2+ pedal pulses. No carotid bruits.  Abdomen: Abdomen is soft and nontender.  No evidence of intra-abdominal masses.  Positive bowel sounds noted in all quadrants.   Musculoskeletal: No joint deformity upper and lower extremities. Good ROM, no contractures. Normal muscle tone.     Condition at discharge: fair  The results of significant diagnostics from this hospitalization (including imaging, microbiology, ancillary and laboratory) are listed below for reference.   Imaging Studies: DG Ribs Unilateral Right  Result Date: 09/10/2022 CLINICAL DATA:  Pain across the lower right ribs.  No injury. EXAM: RIGHT RIBS - 2 VIEW COMPARISON:  None Available. FINDINGS: No fracture or other bone lesions are seen involving the ribs. IMPRESSION: Negative. Electronically Signed   By: Sherron Ales M.D.   On: 09/10/2022 13:01   DG HIP UNILAT WITH PELVIS 2-3 VIEWS LEFT  Result Date: 09/10/2022 CLINICAL DATA:  37 year old female history of generalized hip pain. EXAM: DG HIP (WITH OR WITHOUT PELVIS) 2-3V LEFT COMPARISON:  No priors. FINDINGS: There is no evidence of hip fracture or dislocation. There is no evidence of arthropathy or other focal bone abnormality. IMPRESSION: Negative. Electronically Signed   By: Trudie Reed M.D.   On: 09/10/2022 10:14   US Abdomen Limited RUQ (LIVER/GB)  Result Date: 09/10/2022 CLINICAL DATA:  [id number] with history of LFT elevation. EXAM: ULTRASOUND ABDOMEN LIMITED RIGHT UPPER QUADRANT COMPARISON:  CT with IV contrast 02/15/2021 FINDINGS: Gallbladder: Surgically absent.  There was no elicited positive right upper abdominal tenderness. Common bile duct: Diameter: 2.3 mm.  No intrahepatic biliary prominence. Liver: No focal lesion identified. There is increased parenchymal echogenicity suggesting at least mild steatosis. Portal vein is patent on color Doppler imaging with normal direction of blood flow towards the liver. Other: No right quadrant ascites. IMPRESSION: 1. Status post cholecystectomy. No biliary dilatation. 2. Mild hepatic steatosis. Electronically Signed   By: Almira Bar M.D.   On: 09/10/2022 05:30    Microbiology: Results for orders placed or performed during the hospital encounter of 02/16/21  Resp Panel by RT-PCR (Flu A&B, Covid) Nasopharyngeal Swab     Status: None   Collection Time: 02/16/21  7:24 AM   Specimen: Nasopharyngeal Swab; Nasopharyngeal(NP) swabs in vial transport medium  Result Value Ref Range Status   SARS Coronavirus 2 by RT PCR NEGATIVE NEGATIVE Final    Comment: (NOTE) SARS-CoV-2 target nucleic acids are NOT DETECTED.  The SARS-CoV-2 RNA is generally detectable in upper respiratory specimens during the acute phase of infection. The lowest concentration of SARS-CoV-2 viral copies this assay can detect is 138 copies/mL. A negative result does not preclude SARS-Cov-2 infection and should not be used as the sole basis for treatment or other patient management decisions. A negative result may occur with  improper specimen collection/handling, submission of  specimen other than nasopharyngeal swab, presence of viral mutation(s) within the areas targeted by this assay, and inadequate number of viral copies(<138 copies/mL). A negative result must be combined with clinical observations, patient history, and epidemiological information. The expected result is Negative.  Fact Sheet for Patients:  BloggerCourse.com  Fact Sheet for Healthcare Providers:  SeriousBroker.it  This test  is no t yet approved or cleared by the [place] FDA and  has been authorized for detection and/or diagnosis of SARS-CoV-2 by FDA under an Emergency Use Authorization (EUA). This EUA will remain  in effect (meaning this test can be used) for the duration of the COVID-19 declaration under Section 564(b)(1) of the Act, 21 U.S.C.section 360bbb-3(b)(1), unless the authorization is terminated  or revoked sooner.       Influenza A by PCR NEGATIVE NEGATIVE Final   Influenza B by PCR NEGATIVE NEGATIVE Final    Comment: (NOTE) The Xpert Xpress SARS-CoV-2/FLU/RSV plus assay is intended as an aid in the diagnosis of influenza from Nasopharyngeal swab specimens and should not be used as a sole basis for treatment. Nasal washings and aspirates are unacceptable for Xpert Xpress SARS-CoV-2/FLU/RSV testing.  Fact Sheet for Patients: BloggerCourse.com  Fact Sheet for Healthcare Providers: SeriousBroker.it  This test is not yet approved or cleared by the [place] FDA and has been authorized for detection and/or diagnosis of SARS-CoV-2 by FDA under an Emergency Use Authorization (EUA). This EUA will remain in effect (meaning this test can be used) for the duration of the COVID-19 declaration under Section 564(b)(1) of the Act, 21 U.S.C. section 360bbb-3(b)(1), unless the authorization is terminated or revoked.  Performed at [facility], [address], [place], [place] [address]     Labs: CBC: Recent Labs  Lab 09/10/22 0400 09/11/22 0713 09/12/22 0448  WBC 2.8* 4.4 4.2  NEUTROABS 0.8* 2.2 2.1  HGB 10.9* 11.3* 11.3*  HCT 32.7* 33.6* 34.6*  MCV 97.3 96.3 98.0  PLT 235 216 213   Basic Metabolic Panel: Recent Labs  Lab 09/10/22 0145 09/11/22 0713 09/12/22 0448  NA 141 135 137  K 3.8 3.7 3.7  CL 113* 107 106  CO2 19* 19* 21*  GLUCOSE 107* 92 99  BUN 6 7 10   CREATININE 0.73 0.66 0.74  CALCIUM 8.4*  8.6* 9.1  MG  --  1.9 1.9  PHOS  --   --  3.8  3.8   Liver Function Tests: Recent Labs  Lab 09/10/22 0145 09/11/22 0713 09/12/22 0448  AST 699* 135* 60*  ALT 93* 91* 69*  ALKPHOS 130* 114 109  BILITOT 0.6 0.7 0.6  PROT 7.4 6.6 6.9  ALBUMIN 3.7 3.4* 3.5  3.5   CBG: No results for input(s): "GLUCAP" in the last 168 hours.  Discharge time spent: greater than 30 minutes.  Signed: Marinda Elk, MD [facility] 09/12/2022

## 2022-09-12 NOTE — TOC Transition Note (Signed)
Transition of Care [facility] - CM/SW Discharge Note   Patient Details  Name: Maureen Torres MRN: [id number] Date of Birth: [birth year]  Transition of Care [facility]) CM/SW Contact:  Otelia Santee, LCSW Phone Number: 09/12/2022, 12:28 PM   Clinical Narrative:    Per [facility], pt has been accepted to [facility]. Bed number is 307-1. Accepting provider is Dr Sherron Flemings. Number for report is [phone]. The pt may arrive after 5pm.  Pt will be transported via [facility] by calling [phone].       Final next level of care: Psychiatric Hospital Barriers to Discharge: No Barriers Identified   Patient Goals and CMS Choice CMS Medicare.gov Compare Post Acute Care list provided to:: Patient Choice offered to / list presented to : Patient  Discharge Placement                         Discharge Plan and Services Additional resources added to the After Visit Summary for                  DME Arranged: N/A DME Agency: NA                  Social Determinants of Health (SDOH) Interventions SDOH Screenings   Tobacco Use: Medium Risk (09/10/2022)     Readmission Risk Interventions     No data to display

## 2022-09-12 NOTE — Tx Team (Signed)
Initial Treatment Plan 09/12/2022 6:40 PM [name] WUJ:[id number]    PATIENT STRESSORS: Financial difficulties   Marital or family conflict   Substance abuse   Traumatic event     PATIENT STRENGTHS: Capable of independent living  Communication skills  Physical Health  Work skills    PATIENT IDENTIFIED PROBLEMS: Risk for self harm "I want to jump off the bridge with my car. I want to stop thinking of suicide". H/O cutting.    Family Conflict "My stressor is my family, my children, everything".    Substance Abuse (Etoh Abuse & THC use) "I drink too much. BAL 260 on admission.     Financial Constraints         DISCHARGE CRITERIA:  Improved stabilization in mood, thinking, and/or behavior Verbal commitment to aftercare and medication compliance Withdrawal symptoms are absent or subacute and managed without 24-hour nursing intervention  PRELIMINARY DISCHARGE PLAN: Outpatient therapy Return to previous living arrangement Return to previous work or school arrangements  PATIENT/FAMILY INVOLVEMENT: This treatment plan has been presented to and reviewed with the patient, Maureen Torres. The patient have been given the opportunity to ask questions and make suggestions.  Sherryl Manges, RN 09/12/2022, 6:40 PM

## 2022-09-12 NOTE — Consult Note (Addendum)
[facility] Face-to-Face Psychiatry Consult   Reason for Consult:'' SUICIDAL IDEATIONS AT HOME- CURRENTLY DENIES- TOC CONSULTED FOR SUBSATNCE ISSUES (ETOH/THC).'' Referring Physician:  Junious Silk, NP Patient Identification: Maureen Torres MRN:  [id number] Principal Diagnosis: Suicidal ideation Diagnosis:  Principal Problem:   Suicidal ideation Active Problems:   Gastroesophageal reflux disease   Acute alcoholic hepatitis   Major depressive disorder, single episode, severe (HCC)   Alcohol withdrawal (HCC)   Nicotine dependence, cigarettes, uncomplicated   Total Time spent with patient: 1 hour  I personally spent 35 minutes on the unit in direct patient care. The direct patient care time included face-to-face time with the patient, reviewing the patient's chart, communicating with other professionals, and coordinating care. Greater than 50% of this time was spent in counseling or coordinating care with the patient regarding goals of hospitalization, psycho-education, and discharge planning needs.   Subjective:   Maureen Torres is a 37 y.o. female patient admitted with suicidal.  HPI:  38 year old female with history of gastric bypass, acid reflux and alcohol use disorder-severe who was admitted for suicidal ideation. Patient states that she has been dealing with depression, anxiety and childhood traumas most of her life. However, she has not formally sought a psychiatric or counseling help. Instead, she has been self medicating by drinking alcohol. She states that her symptoms has been getting worse in the last 2 months to the extent that she has been having suicidal thoughts but with no specific plan. She reports having a lot of issues with her family, issues with her older son, significant other, job, finances leading to social isolation and excessive alcohol use. She reports prior history of her attempt to overdose as a teenager and sates "I do not want to go back there". Patient reports that she  has been feeling like giving up and was debating on putting her house up while having thoughts of driving her car off a bridge. She denies psychosis, delusions, mood swings but unable to contract for safety.   On evaluation patient is alert and oriented, calm and cooperative, very pleasant upon approach.  She is noted to recently had dyed her red, patient resides with her children [phone]) and boyfriend(who is present). She is unemployed at this time, recently fired but does not disclose why.   Patient denies any access to weapons, denies any nicotine and or substance abuse.  She reports poor sleep "1 hour maybe"  and poor appetite " I can go all day without eating. I will graze over food."  She has never received therapy " most mental health stuff if kept quiet in my family. "  Patient denies any auditory and/or visual hallucinations, does not appear to be responding to internal or external stimuli.  There is no evidence of delusional thought content and patient appears to answer all questions appropriately.   Patient does not currently present with active symptoms of an acute manic or psychotic episode and she is denying SI/HI. Patient does not meet involuntary commitment criteria at this time. Combination of personality structure and acute substance abuse "unhealthy coping mechanism" leading to impulsivity and mood dysregulation increase overall risk of self-harm. 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 However, at this time, her mood is depressed and dysphoric, the patient is future oriented (interested in completing nursing school). There is no indication of dangerousness to self/others. She otherwise  has very high risk factors and will benefit from inpatient psych, for medication management, coping skills and therapy.   Given above risks versus protective factors patients current acute risk for  harm to self at this time is mitigated; but elevated in term of substance abuse. Given above risks versus protective factors patients current chronic risk for harm to self at this time is moderate to severe in context of ongoing substance use, but mitigated in terms of intentional imminent self-harm/suicide.  Past Psychiatric History: as above  Family Psych Hx: (2) Family members completed suicide. (1) brother overdose in his car(intentional); (1) murder suicide by cousin "killed his wife and children, then shot himself in the head."   Mother and MGF with underlying psychiatric conditions. She was removed from her mothers custody at an early age of 38.   Risk to Self:  suicidal thoughts without a plan today Risk to Others:  denies Prior Inpatient Therapy:  none Prior Outpatient Therapy:  none  Past Medical History:  Past Medical History:  Diagnosis Date   Acid reflux     Past Surgical History:  Procedure Laterality Date   CHOLECYSTECTOMY     rous and y     Family History:  Family History  Problem Relation Age of Onset   Hypertension Other    Pancreatitis Neg Hx    Family Psychiatric  History:   Social History:  Social History   Substance and Sexual Activity  Alcohol Use Yes   Alcohol/week: 4.0 standard drinks of alcohol   Types: 4 Glasses of wine per week     Social History   Substance and Sexual Activity  Drug Use Never    Social History   Socioeconomic History   Marital status: Single    Spouse name: Not on file   Number of children: Not on file   Years of education: Not on file   Highest education level: Not on file  Occupational History   Not on file  Tobacco Use   Smoking status: Former    Types: Cigarettes   Smokeless tobacco: Never  Vaping Use   Vaping Use: Every day  Substance and Sexual Activity   Alcohol use: Yes    Alcohol/week: 4.0 standard drinks of alcohol    Types: 4 Glasses of wine per week   Drug use: Never   Sexual activity: Not on  file  Other Topics Concern   Not on file  Social History Narrative   Not on file   Social Determinants of Health   Financial Resource Strain: Not on file  Food Insecurity: Not on file  Transportation Needs: Not on file  Physical Activity: Not on file  Stress: Not on file  Social Connections: Not on file   Additional Social History:    Allergies:   Allergies  Allergen Reactions   Aspirin Other (See Comments)    Stomach ulcer   Ibuprofen Other (See Comments)    Gastric Bypass--advised to avoid   Nsaids Other (See Comments)    Gastric bypass Hx gastric bypass     Labs:  Results for orders placed or performed during the hospital encounter of 09/10/22 (from the past 48 hour(s))  Vitamin B12     Status: None   Collection Time: 09/10/22  2:42 PM  Result Value Ref Range   Vitamin B-12 858 180 - 914 pg/mL    Comment: (NOTE) This assay is not validated for testing neonatal or myeloproliferative syndrome specimens for Vitamin B12 levels. Performed at  [facility], [address] [place], [place] [address]   Folate     Status: Abnormal   Collection Time: 09/10/22  2:42 PM  Result Value Ref Range   Folate 3.0 (L) >5.9 ng/mL    Comment: Performed at [facility], [address] [place], [place] [address]  Iron and TIBC     Status: Abnormal   Collection Time: 09/10/22  2:42 PM  Result Value Ref Range   Iron 166 28 - 170 ug/dL   TIBC 098 (L) 119 - 147 ug/dL   Saturation Ratios 75 (H) 10.4 - 31.8 %   UIBC 57 ug/dL    Comment: Performed at [facility], [address], [place], [place] [address]  Ferritin     Status: None   Collection Time: 09/10/22  2:42 PM  Result Value Ref Range   Ferritin 97 11 - 307 ng/mL    Comment: Performed at [facility], [address], [place], [place] [address]  Reticulocytes     Status: Abnormal   Collection Time: 09/10/22  2:42 PM  Result Value Ref Range   Retic  Ct Pct 3.0 0.4 - 3.1 %   RBC. 3.23 (L) 3.87 - 5.11 MIL/uL   Retic Count, Absolute 96.9 19.0 - 186.0 K/uL   Immature Retic Fract 24.0 (H) 2.3 - 15.9 %    Comment: Performed at [facility], [address], [place], [place] [address]  TSH     Status: None   Collection Time: 09/10/22  2:42 PM  Result Value Ref Range   TSH 0.867 0.350 - 4.500 uIU/mL    Comment: Performed by a 3rd Generation assay with a functional sensitivity of <=0.01 uIU/mL. Performed at [facility], [address], [place], [place] [address]   CBC with Differential/Platelet     Status: Abnormal   Collection Time: 09/11/22  7:13 AM  Result Value Ref Range   WBC 4.4 4.0 - 10.5 K/uL   RBC 3.49 (L) 3.87 - 5.11 MIL/uL   Hemoglobin 11.3 (L) 12.0 - 15.0 g/dL   HCT 52.8 (L) 41.3 - 24.4 %   MCV 96.3 80.0 - 100.0 fL   MCH 32.4 26.0 - 34.0 pg   MCHC 33.6 30.0 - 36.0 g/dL   RDW 01.0 27.2 - 53.6 %   Platelets 216 150 - 400 K/uL   nRBC 0.0 0.0 - 0.2 %   Neutrophils Relative % 50 %   Neutro Abs 2.2 1.7 - 7.7 K/uL   Lymphocytes Relative 41 %   Lymphs Abs 1.8 0.7 - 4.0 K/uL   Monocytes Relative 7 %   Monocytes Absolute 0.3 0.1 - 1.0 K/uL   Eosinophils Relative 1 %   Eosinophils Absolute 0.0 0.0 - 0.5 K/uL   Basophils Relative 1 %   Basophils Absolute 0.0 0.0 - 0.1 K/uL   Immature Granulocytes 0 %   Abs Immature Granulocytes 0.00 0.00 - 0.07 K/uL    Comment: Performed at [facility], [address] [place], [place] [address]  Magnesium     Status: None   Collection Time: 09/11/22  7:13 AM  Result Value Ref Range   Magnesium 1.9 1.7 - 2.4 mg/dL    Comment: Performed at [facility], [address] [place], [place] [address]  Basic metabolic panel     Status: Abnormal   Collection Time: 09/11/22  7:13 AM  Result Value Ref Range  Sodium 135 135 - 145 mmol/L   Potassium 3.7 3.5 - 5.1 mmol/L   Chloride 107 98 - 111 mmol/L   CO2 19 (L) 22 - 32  mmol/L   Glucose, Bld 92 70 - 99 mg/dL    Comment: Glucose reference range applies only to samples taken after fasting for at least 8 hours.   BUN 7 6 - 20 mg/dL   Creatinine, Ser 1.61 0.44 - 1.00 mg/dL   Calcium 8.6 (L) 8.9 - 10.3 mg/dL   GFR, Estimated >09 >60 mL/min    Comment: (NOTE) Calculated using the CKD-EPI Creatinine Equation (2021)    Anion gap 9 5 - 15    Comment: Performed at [facility], [address] [place], [place] [address]  Hepatic function panel     Status: Abnormal   Collection Time: 09/11/22  7:13 AM  Result Value Ref Range   Total Protein 6.6 6.5 - 8.1 g/dL   Albumin 3.4 (L) 3.5 - 5.0 g/dL   AST 811 (H) 15 - 41 U/L   ALT 91 (H) 0 - 44 U/L   Alkaline Phosphatase 114 38 - 126 U/L   Total Bilirubin 0.7 0.3 - 1.2 mg/dL   Bilirubin, Direct 0.2 0.0 - 0.2 mg/dL   Indirect Bilirubin 0.5 0.3 - 0.9 mg/dL    Comment: Performed at [facility], [address] [place], [place] [address]  Hepatic function panel     Status: Abnormal   Collection Time: 09/12/22  4:48 AM  Result Value Ref Range   Total Protein 6.9 6.5 - 8.1 g/dL   Albumin 3.5 3.5 - 5.0 g/dL   AST 60 (H) 15 - 41 U/L   ALT 69 (H) 0 - 44 U/L   Alkaline Phosphatase 109 38 - 126 U/L   Total Bilirubin 0.6 0.3 - 1.2 mg/dL   Bilirubin, Direct 0.1 0.0 - 0.2 mg/dL   Indirect Bilirubin 0.5 0.3 - 0.9 mg/dL    Comment: Performed at [facility], [address] [place], [place] [address]  Renal function panel     Status: Abnormal   Collection Time: 09/12/22  4:48 AM  Result Value Ref Range   Sodium 137 135 - 145 mmol/L   Potassium 3.7 3.5 - 5.1 mmol/L   Chloride 106 98 - 111 mmol/L   CO2 21 (L) 22 - 32 mmol/L   Glucose, Bld 99 70 - 99 mg/dL    Comment: Glucose reference range applies only to samples taken after fasting for at least 8 hours.   BUN 10 6 - 20 mg/dL   Creatinine, Ser 1.30 0.44 - 1.00 mg/dL   Calcium 9.1 8.9 - 86.5 mg/dL   Phosphorus 3.8  2.5 - 4.6 mg/dL   Albumin 3.5 3.5 - 5.0 g/dL   GFR, Estimated >78 >46 mL/min    Comment: (NOTE) Calculated using the CKD-EPI Creatinine Equation (2021)    Anion gap 10 5 - 15    Comment: Performed at [facility], [address] [place], [place] [address]  Magnesium     Status: None   Collection Time: 09/12/22  4:48 AM  Result Value Ref Range   Magnesium 1.9 1.7 - 2.4 mg/dL    Comment: Performed at [facility], [address] [place], [place] [address]  Phosphorus     Status: None   Collection Time: 09/12/22  4:48 AM  Result Value Ref Range   Phosphorus 3.8 2.5 - 4.6 mg/dL  Comment: Performed at [facility], [address], [place], [place] [address]  CBC with Differential/Platelet     Status: Abnormal   Collection Time: 09/12/22  4:48 AM  Result Value Ref Range   WBC 4.2 4.0 - 10.5 K/uL   RBC 3.53 (L) 3.87 - 5.11 MIL/uL   Hemoglobin 11.3 (L) 12.0 - 15.0 g/dL   HCT 19.1 (L) 47.8 - 29.5 %   MCV 98.0 80.0 - 100.0 fL   MCH 32.0 26.0 - 34.0 pg   MCHC 32.7 30.0 - 36.0 g/dL   RDW 62.1 30.8 - 65.7 %   Platelets 213 150 - 400 K/uL   nRBC 0.0 0.0 - 0.2 %   Neutrophils Relative % 48 %   Neutro Abs 2.1 1.7 - 7.7 K/uL   Lymphocytes Relative 41 %   Lymphs Abs 1.7 0.7 - 4.0 K/uL   Monocytes Relative 9 %   Monocytes Absolute 0.4 0.1 - 1.0 K/uL   Eosinophils Relative 1 %   Eosinophils Absolute 0.0 0.0 - 0.5 K/uL   Basophils Relative 1 %   Basophils Absolute 0.0 0.0 - 0.1 K/uL   Immature Granulocytes 0 %   Abs Immature Granulocytes 0.00 0.00 - 0.07 K/uL    Comment: Performed at [facility], [address], [place], [place] [address]  Ammonia     Status: None   Collection Time: 09/12/22  4:48 AM  Result Value Ref Range   Ammonia 23 9 - 35 umol/L    Comment: Performed at [facility], [address] [place], [place] [address]    Current Facility-Administered Medications  Medication  Dose Route Frequency Provider Last Rate Last Admin   acetaminophen (TYLENOL) tablet 650 mg  650 mg Oral Q4H PRN Marinda Elk, MD   650 mg at 09/12/22 0820   enoxaparin (LOVENOX) injection 40 mg  40 mg Subcutaneous Q24H Russella Dar, NP   40 mg at 09/12/22 0928   escitalopram (LEXAPRO) tablet 10 mg  10 mg Oral Daily Akintayo, Mojeed, MD   10 mg at 09/12/22 4132   folic acid (FOLVITE) tablet 1 mg  1 mg Oral Daily Shalhoub, Deno Lunger, MD   1 mg at 09/12/22 4401   gabapentin (NEURONTIN) capsule 200 mg  200 mg Oral BID Jannifer Franklin, Mojeed, MD   200 mg at 09/12/22 0272   LORazepam (ATIVAN) tablet 1-4 mg  1-4 mg Oral Q1H PRN Marinda Elk, MD   1 mg at 09/12/22 1349   multivitamin with minerals tablet 1 tablet  1 tablet Oral Daily Shalhoub, Deno Lunger, MD   1 tablet at 09/12/22 5366   nicotine (NICODERM CQ - dosed in mg/24 hours) patch 21 mg  21 mg Transdermal Q24H Marinda Elk, MD   21 mg at 09/11/22 1420   ondansetron (ZOFRAN-ODT) disintegrating tablet 4 mg  4 mg Oral Q8H PRN Marinda Elk, MD   4 mg at 09/12/22 0928   pantoprazole (PROTONIX) EC tablet 40 mg  40 mg Oral Daily Marinda Elk, MD   40 mg at 09/12/22 4403   polyethylene glycol (MIRALAX / GLYCOLAX) packet 17 g  17 g Oral Daily PRN Marinda Elk, MD       thiamine (VITAMIN B1) tablet 100 mg  100 mg Oral Daily Shalhoub, Deno Lunger, MD   100 mg at 09/12/22 4742   Or   thiamine (VITAMIN B1) injection 100 mg  100 mg Intravenous Daily Shalhoub, Deno Lunger,  MD   100 mg at 09/10/22 1849    Musculoskeletal: Strength & Muscle Tone: within normal limits Gait & Station: normal Patient leans: N/A    Psychiatric Specialty Exam:  Presentation  General Appearance:  Appropriate for Environment; Casual  Eye Contact: Fair  Speech: Clear and Coherent; Normal Rate  Speech Volume: Decreased  Handedness: Right   Mood and Affect  Mood: Dysphoric; Depressed  Affect: Tearful   Thought Process  Thought  Processes: Coherent; Linear  Descriptions of Associations:Intact  Orientation:Full (Time, Place and Person)  Thought Content:Logical  History of Schizophrenia/Schizoaffective disorder:No data recorded Duration of Psychotic Symptoms:No data recorded Hallucinations:Hallucinations: None  Ideas of Reference:None  Suicidal Thoughts:Suicidal Thoughts: Yes, Passive SI Passive Intent and/or Plan: Without Intent; Without Plan; Without Means to Carry Out; Without Access to Means  Homicidal Thoughts:Homicidal Thoughts: No   Sensorium  Memory: Immediate Good; Recent Good; Remote Good  Judgment: Intact  Insight: Fair   Art therapist  Concentration: Fair  Attention Span: Fair  Recall: [employer] of Knowledge: Fair  Language: Fair   Psychomotor Activity  Psychomotor Activity: Psychomotor Activity: Normal   Assets  Assets: Desire for Improvement; Communication Skills   Sleep  Sleep: Sleep: Fair   Physical Exam: Physical Exam Constitutional:      Appearance: Normal appearance. She is normal weight.  Neurological:     General: No focal deficit present.     Mental Status: She is alert and oriented to person, place, and time. Mental status is at baseline.  Psychiatric:        Attention and Perception: Attention and perception normal.        Mood and Affect: Mood is depressed. Affect is tearful.        Speech: Speech normal.        Behavior: Behavior normal. Behavior is cooperative.        Thought Content: Thought content normal. Suicidal: some suicidal thoughts.        Cognition and Memory: Cognition and memory normal.        Judgment: Judgment normal.    Review of Systems  Psychiatric/Behavioral:  Positive for depression, substance abuse and suicidal ideas. Negative for hallucinations and memory loss. The patient is nervous/anxious (when discussing son her anxiety increased and she displays psychomotor agitation.) and has insomnia.   All other  systems reviewed and are negative.  Blood pressure 115/77, pulse 75, temperature 97.8 F (36.6 C), temperature source Oral, resp. rate 19, weight 77.8 kg, SpO2 100 %. Body mass index is 34.64 kg/m.  Treatment Plan Summary: 37 year old with long history of alcohol use disorder, depression and anxiety who was admitted due to suicidal thoughts. Patient reports worsening depression, anxiety and numerous stressors in her life. At the moment, she is unable to contract for safety and will benefit from inpatient psychiatric admission after she is medically stabilized.  Plan/Recommendations: -Continue 1:1 sitter for safety -Continue Lorazepam 1 mg Q4H prn for alcohol withdrawal -Add Gabapentin 200 mg twice daily for mood/anxiety/alcohol withdrawal -Add Lexapro 10 mg daily for anxiety/depression. Consider augmentation therapy with abilify.  -TOC consult to facilitate inpatient psychiatric admission after patient is medically stabilized  Disposition: Recommend psychiatric Inpatient admission when medically cleared. Supportive therapy provided about ongoing stressors. Psychiatric service will follow the patient   Patient has been accepted to [facility]. Voluntary consent obtained and signed, faxed to [facility] by this provider. LCSW has been notified.  Maryagnes Amos, FNP 09/12/2022 1:54 PM

## 2022-09-12 NOTE — Progress Notes (Signed)
D) Pt received calm, visible, participating in milieu, and in no acute distress. Pt A & O x4. Pt endorses passive SI, depression, anxiety and pain at this time, but denies SI, A/ VH. A) Pt encouraged to drink fluids. Pt encouraged to come to staff with needs. Pt encouraged to attend and participate in groups. Pt encouraged to set reachable goals.  R) Pt remained safe on unit, in no acute distress, will continue to assess.     09/12/22 2000  Psych Admission Type (Psych Patients Only)  Admission Status Voluntary  Psychosocial Assessment  Patient Complaints Anxiety;Depression  Eye Contact Fair  Facial Expression Sad  Affect Flat;Sad  Speech Logical/coherent  Interaction Assertive  Motor Activity Fidgety (wnl)  Appearance/Hygiene Unremarkable  Behavior Characteristics Cooperative;Anxious  Mood Anxious;Sad  Thought Process  Coherency WDL  Content Blaming self  Delusions None reported or observed  Perception WDL  Hallucination None reported or observed  Judgment Poor  Confusion WDL  Danger to Self  Current suicidal ideation? Passive  Self-Injurious Behavior No self-injurious ideation or behavior indicators observed or expressed   Agreement Not to Harm Self Yes  Description of Agreement Verbal  Danger to Others  Danger to Others None reported or observed

## 2022-09-12 NOTE — Plan of Care (Signed)

## 2022-09-12 NOTE — Discharge Instructions (Addendum)
Patient to abstain from alcohol use going forward Patient is been placed on daily proton pump inhibitor for gastroesophageal reflux disease Patient has been counseled on smoking cessation has been placed on nicotine patches for now Patient has been prescribed as needed MiraLAX for constipation.

## 2022-09-12 NOTE — [facility] Group Notes (Signed)
Adult Psychoeducational Group Note  Date:  09/12/2022 Time:  10:09 PM  Group Topic/Focus:  Wrap-Up Group:   The focus of this group is to help patients review their daily goal of treatment and discuss progress on daily workbooks.  Participation Level:  Did Not Attend   Maura Crandall [facility] 09/12/2022, 10:09 PM

## 2022-09-13 MED ORDER — ARIPIPRAZOLE 2 MG PO TABS
2.0000 mg | ORAL_TABLET | Freq: Every day | ORAL | Status: DC
  Administered 2022-09-13 – 2022-09-16 (×4): 2 mg via ORAL
  Filled 2022-09-13 (×8): qty 1

## 2022-09-13 MED ORDER — ESCITALOPRAM OXALATE 20 MG PO TABS
20.0000 mg | ORAL_TABLET | Freq: Every day | ORAL | Status: DC
  Administered 2022-09-14 – 2022-09-16 (×3): 20 mg via ORAL
  Filled 2022-09-13: qty 1
  Filled 2022-09-13: qty 2
  Filled 2022-09-13 (×4): qty 1
  Filled 2022-09-13: qty 2

## 2022-09-13 NOTE — Progress Notes (Signed)
Adult Psychoeducational Group Note  Date:  09/13/2022 Time:  10:54 PM  Group Topic/Focus:  Wrap-Up Group:   The focus of this group is to help patients review their daily goal of treatment and discuss progress on daily workbooks.  Participation Level:  Active  Participation Quality:  Appropriate  Affect:  Appropriate  Cognitive:  Appropriate  Insight: Appropriate  Engagement in Group:  Engaged  Modes of Intervention:  Discussion  Additional Comments:  Pt. Did not attend group.  Joselyn Arrow 09/13/2022, 10:54 PM

## 2022-09-13 NOTE — [facility] Group Notes (Signed)
Spiritual care group on grief and loss facilitated by chaplain Katy , [facility]   Group Goal:   Support / Education around grief and loss   Members engage in facilitated group support and psycho-social education.   Group Description:   Following introductions and group rules, group members engaged in facilitated group dialog and support around topic of loss, with particular support around experiences of loss in their lives. Group Identified types of loss (relationships / self / things) and identified patterns, circumstances, and changes that precipitate losses. Reflected on thoughts / feelings around loss, normalized grief responses, and recognized variety in grief experience. Group noted Worden's four tasks of grief in discussion.   Group drew on Adlerian / Rogerian, narrative, MI,   Patient Progress: Did not attend.  
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
## 2022-09-13 NOTE — Group Note (Signed)
Date:  09/13/2022 Time:  12:16 PM  Group Topic/Focus:  Managing Feelings:   The focus of this group is to identify what feelings patients have difficulty handling and develop a plan to handle them in a healthier way upon discharge.    Participation Level:  Active  Participation Quality:  Appropriate  Affect:  Appropriate  Cognitive:  Appropriate  Insight: Appropriate  Engagement in Group:  Engaged  Modes of Intervention:  Discussion, Rapport Building, Socialization, and Support  Additional Comments:    Memory Dance  09/13/2022, 12:16 PM

## 2022-09-13 NOTE — Progress Notes (Signed)
Adult Psychoeducational Group Note  Date:  09/13/2022 Time:  2:45 PM  Group Topic/Focus:  Goals Group:   The focus of this group is to help patients establish daily goals to achieve during treatment and discuss how the patient can incorporate goal setting into their daily lives to aide in recovery. Orientation:   The focus of this group is to educate the patient on the purpose and policies of crisis stabilization and provide a format to answer questions about their admission.  The group details unit policies and expectations of patients while admitted.  Participation Level:  Active  Participation Quality:  Attentive  Affect:  Appropriate  Cognitive:  Appropriate  Insight: Good  Engagement in Group:  Engaged  Modes of Intervention:  Discussion and Education  Additional Comments:  Pt actively participated in group. Pt identified her goal of learning more coping skills and being prepared when discharged.      09/13/2022, 2:45 PM

## 2022-09-13 NOTE — Group Note (Signed)
Recreation Therapy Group Note   Group Topic:Animal Assisted Therapy   Group Date: 09/13/2022 Start Time: 0950 End Time: 1030 Facilitators:  -McCall, LRT,CTRS Location: [address] Dayroom   Animal-Assisted Activity (AAA) Program Checklist/Progress Notes Patient Eligibility Criteria Checklist & Daily Group note for Rec Tx Intervention  AAA/T Program Assumption of Risk Form signed by Patient/ or Parent Legal Guardian Yes  Patient understands his/her participation is voluntary Yes   Affect/Mood: N/A   Participation Level: Did not attend    Clinical Observations/Individualized Feedback:     Plan: Continue to engage patient in RT group sessions 2-3x/week.    -McCall, LRT,CTRS 09/13/2022 12:26 PM

## 2022-09-14 ENCOUNTER — Encounter (HOSPITAL_COMMUNITY): Payer: Self-pay

## 2022-09-14 MED ORDER — ONDANSETRON HCL 4 MG PO TABS
4.0000 mg | ORAL_TABLET | Freq: Every day | ORAL | Status: DC | PRN
  Administered 2022-09-14 – 2022-09-17 (×4): 4 mg via ORAL
  Filled 2022-09-14 (×4): qty 1

## 2022-09-14 MED ORDER — TRAZODONE HCL 50 MG PO TABS
50.0000 mg | ORAL_TABLET | Freq: Once | ORAL | Status: AC
  Administered 2022-09-14: 50 mg via ORAL
  Filled 2022-09-14 (×2): qty 1

## 2022-09-14 NOTE — [facility] Group Notes (Signed)
Spiritual care group facilitated by Chaplain Dyanne Carrel, [facility]  Group focused on topic of strength. Group members reflected on what thoughts and feelings emerge when they hear this topic. They then engaged in facilitated dialog around how strength is present in their lives. This dialog focused on representing what strength had been to them in their lives (images and patterns given) and what they saw as helpful in their life now (what they needed / wanted).  Activity drew on narrative framework.  Patient Progress: Maureen Torres attended group and actively engaged and participated in group conversation and activities.  Her comments were on topic and appropriate and she was very supportive of peers.

## 2022-09-14 NOTE — Group Note (Signed)
Date:  09/14/2022 Time:  6:39 PM  Group Topic/Focus:  Goals Group:   The focus of this group is to help patients establish daily goals to achieve during treatment and discuss how the patient can incorporate goal setting into their daily lives to aide in recovery. Orientation:   The focus of this group is to educate the patient on the purpose and policies of crisis stabilization and provide a format to answer questions about their admission.  The group details unit policies and expectations of patients while admitted.    Participation Level:  Active  Participation Quality:  Appropriate and Sharing  Affect:  Appropriate  Cognitive:  Appropriate  Insight: Appropriate  Engagement in Group:  Engaged  Modes of Intervention:  Activity, Orientation, and Rapport Building  Additional Comments:   Pt attended and participated in the Orientation/Goals group. Pt denied SI/HI/AVH,. Pt shared concerns related to treatment/program structure effectiveness. Pt completed the daily goal activity identifying attending more groups daily and increasing participation. Maureen Torres  09/14/2022, 6:39 PM
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
## 2022-09-14 NOTE — BH IP Treatment Plan (Unsigned)
Interdisciplinary Treatment and Diagnostic Plan Update  09/14/2022 Time of Session: 11:15am Maureen Torres MRN: [id number]  Principal Diagnosis: MDD (major depressive disorder), recurrent episode, severe (HCC)  Secondary Diagnoses: Principal Problem:   MDD (major depressive disorder), recurrent episode, severe (HCC)   Current Medications:  Current Facility-Administered Medications  Medication Dose Route Frequency Provider Last Rate Last Admin   acetaminophen (TYLENOL) tablet 650 mg  650 mg Oral Q6H PRN Maryagnes Amos, FNP   650 mg at 09/14/22 0955   alum & mag hydroxide-simeth (MAALOX/MYLANTA) 200-200-20 MG/5ML suspension 30 mL  30 mL Oral Q4H PRN Maryagnes Amos, FNP       ARIPiprazole (ABILIFY) tablet 2 mg  2 mg Oral Daily Lauro Franklin, MD   2 mg at 09/14/22 0758   escitalopram (LEXAPRO) tablet 20 mg  20 mg Oral Daily Rex Kras, MD   20 mg at 09/14/22 0759   folic acid (FOLVITE) tablet 1 mg  1 mg Oral Daily Maryagnes Amos, FNP   1 mg at 09/14/22 0758   LORazepam (ATIVAN) tablet 2 mg  2 mg Oral TID PRN Maryagnes Amos, FNP   2 mg at 09/14/22 1304   Or   LORazepam (ATIVAN) injection 2 mg  2 mg Intramuscular TID PRN Maryagnes Amos, FNP       magnesium hydroxide (MILK OF MAGNESIA) suspension 30 mL  30 mL Oral Daily PRN Maryagnes Amos, FNP   30 mL at 09/13/22 1133   nicotine (NICODERM CQ - dosed in mg/24 hours) patch 21 mg  21 mg Transdermal Q24H Maryagnes Amos, FNP   21 mg at 09/14/22 1110   ondansetron (ZOFRAN) tablet 4 mg  4 mg Oral Daily PRN Rex Kras, MD   4 mg at 09/14/22 1332   pantoprazole (PROTONIX) EC tablet 40 mg  40 mg Oral Daily Maryagnes Amos, FNP   40 mg at 09/14/22 0758   polyethylene glycol (MIRALAX / GLYCOLAX) packet 17 g  17 g Oral Daily PRN Maryagnes Amos, FNP   17 g at 09/13/22 1413   thiamine (Vitamin B-1) tablet 100 mg  100 mg Oral Daily Maryagnes Amos, FNP   100 mg at  09/14/22 9147   PTA Medications: Medications Prior to Admission  Medication Sig Dispense Refill Last Dose   acetaminophen (TYLENOL) 500 MG tablet Take 1,000 mg by mouth every 6 (six) hours as needed for mild pain.      escitalopram (LEXAPRO) 10 MG tablet Take 1 tablet (10 mg total) by mouth daily.      folic acid (FOLVITE) 1 MG tablet Take 1 tablet (1 mg total) by mouth daily.      GLUTAMINE PO Take 1 Dose by mouth daily.      medroxyPROGESTERone Acetate 150 MG/ML SUSY Inject 1 mL into the muscle every 3 (three) months.      nicotine (NICODERM CQ - DOSED IN MG/24 HOURS) 21 mg/24hr patch Place 1 patch (21 mg total) onto the skin daily. 28 patch 0    pantoprazole (PROTONIX) 40 MG tablet Take 1 tablet (40 mg total) by mouth daily.      polyethylene glycol (MIRALAX / GLYCOLAX) 17 g packet Take 17 g by mouth daily as needed for mild constipation. 14 each 0    Probiotic Product (PROBIOTIC PO) Take 1 capsule by mouth daily.      thiamine (VITAMIN B-1) 100 MG tablet Take 1 tablet (100 mg total) by mouth daily.  Patient Stressors: Financial difficulties   Marital or family conflict   Substance abuse   Traumatic event    Patient Strengths: Capable of independent living  Manufacturing systems engineer  Physical Health  Work skills   Treatment Modalities: Medication Management, Group therapy, Case management,  1 to 1 session with clinician, Psychoeducation, Recreational therapy.   Physician Treatment Plan for Primary Diagnosis: MDD (major depressive disorder), recurrent episode, severe (HCC) Long Term Goal(s):     Short Term Goals:    Medication Management: Evaluate patient's response, side effects, and tolerance of medication regimen.  Therapeutic Interventions: 1 to 1 sessions, Unit Group sessions and Medication administration.  Evaluation of Outcomes: Progressing  Physician Treatment Plan for Secondary Diagnosis: Principal Problem:   MDD (major depressive disorder), recurrent episode,  severe (HCC)  Long Term Goal(s):     Short Term Goals:       Medication Management: Evaluate patient's response, side effects, and tolerance of medication regimen.  Therapeutic Interventions: 1 to 1 sessions, Unit Group sessions and Medication administration.  Evaluation of Outcomes: Progressing   RN Treatment Plan for Primary Diagnosis: MDD (major depressive disorder), recurrent episode, severe (HCC) Long Term Goal(s): Knowledge of disease and therapeutic regimen to maintain health will improve  Short Term Goals: Ability to remain free from injury will improve, Ability to verbalize frustration and anger appropriately will improve, Ability to demonstrate self-control, Ability to participate in decision making will improve, Ability to verbalize feelings will improve, Ability to disclose and discuss suicidal ideas, Ability to identify and develop effective coping behaviors will improve, and Compliance with prescribed medications will improve  Medication Management: RN will administer medications as ordered by provider, will assess and evaluate patient's response and provide education to patient for prescribed medication. RN will report any adverse and/or side effects to prescribing provider.  Therapeutic Interventions: 1 on 1 counseling sessions, Psychoeducation, Medication administration, Evaluate responses to treatment, Monitor vital signs and CBGs as ordered, Perform/monitor CIWA, COWS, AIMS and Fall Risk screenings as ordered, Perform wound care treatments as ordered.  Evaluation of Outcomes: Progressing   LCSW Treatment Plan for Primary Diagnosis: MDD (major depressive disorder), recurrent episode, severe (HCC) Long Term Goal(s): Safe transition to appropriate next level of care at discharge, Engage patient in therapeutic group addressing interpersonal concerns.  Short Term Goals: Engage patient in aftercare planning with referrals and resources, Increase social support, Increase  ability to appropriately verbalize feelings, Increase emotional regulation, Facilitate acceptance of mental health diagnosis and concerns, Facilitate patient progression through stages of change regarding substance use diagnoses and concerns, Identify triggers associated with mental health/substance abuse issues, and Increase skills for wellness and recovery  Therapeutic Interventions: Assess for all discharge needs, 1 to 1 time with Social worker, Explore available resources and support systems, Assess for adequacy in community support network, Educate family and significant other(s) on suicide prevention, Complete Psychosocial Assessment, Interpersonal group therapy.  Evaluation of Outcomes: Progressing   Progress in Treatment: Attending groups: Yes. Participating in groups: Yes. Taking medication as prescribed: Yes. Toleration medication: Yes. Family/Significant other contact made: No, will contact:  boyfriend  Patient understands diagnosis: Yes. Discussing patient identified problems/goals with staff: Yes. Medical problems stabilized or resolved: No. Denies suicidal/homicidal ideation: Yes. Issues/concerns per patient self-inventory: No.   New problem(s) identified: No, Describe:  none reported  New Short Term/Long Term Goal(s):   medication stabilization, elimination of SI thoughts, development of comprehensive mental wellness plan.    Patient Goals:  Pt states, "I need to stabilize mood and  be able to better concentrate"  Discharge Plan or Barriers:  Patient recently admitted. CSW will continue to follow and assess for appropriate referrals and possible discharge planning.    Reason for Continuation of Hospitalization: Anxiety Depression Suicidal ideation Withdrawal symptoms  Estimated Length of Stay: 3-5 days  Last 3 [place] Suicide Severity Risk Score: Flowsheet Row Ad[facility] (Current[facility] Hosp-Admission  (Discharged) from 09/10/2022 in [facility] ED from [facility]/19/2022 in [facility])  C-SSRS RISK CATEGORY High Risk High Risk No Risk       Last PHQ 2/9 Scores:     No data to display          Scribe for Treatment Team: Beatris Si, LCSW 09/14/2022 2:02 PM
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
## 2022-09-14 NOTE — Group Note (Signed)
Date:  09/14/2022 Time:  7:05 PM  Group Topic/Focus:  Social Wellness ( Self and Interpersonal)    Participation Level:  Did Not Attend  Participation Quality:   n/a  Affect:   n/a  Cognitive:   n/a  Insight: None  Engagement in Group:   n/a  Modes of Intervention:   n/a  Additional Comments:   Pt did not attend  Stark Bray 09/14/2022, 7:05 PM

## 2022-09-14 NOTE — BHH Group Notes (Signed)
BHH Group Notes:  (Nursing/MHT/Case Management/Adjunct)  Date:  09/14/2022  Time:  8:20 PM  Type of Therapy:   NA group  Participation Level:  Did Not Attend  Participation Quality:    Affect:    Cognitive:    Insight:    Engagement in Group:    Modes of Intervention:    Summary of Progress/Problems: Didn't attend.   Maureen Torres 09/14/2022, 8:20 PM

## 2022-09-14 NOTE — Group Note (Signed)
Recreation Therapy Group Note   Group Topic:Problem Solving  Group Date: 09/14/2022 Start Time: 0930 End Time: 1002 Facilitators:  -McCall, LRT,CTRS Location: 300 [address] Dayroom   Goal Area(s) Addresses:  Patient will effectively work in a team with other group members. Patient will verbalize importance of using appropriate problem solving techniques.  Patient will identify positive change associated with effective problem solving skills.   Group Description: Brain Teasers.  Patients were given two sheets of brain teasers. Patients were to complete the teasers to the best of their ability. Once completed, LRT and patients reviewed the answers. Patients would add all the questions they got correct, the person with the most correct answers got a prize. If there was a tie, LRT would ask a tie breaker question.    Affect/Mood: Appropriate   Participation Level: Engaged   Participation Quality: Independent   Behavior: Appropriate   Speech/Thought Process: Focused   Insight: Good   Judgement: Good   Modes of Intervention: Problem-solving   Patient Response to Interventions:  Engaged   Education Outcome:  Acknowledges education   Clinical Observations/Individualized Feedback: Pt was engaged and social during group session. Pt would work with peers when she or them got stuck on one of the questions. Pt was appropriate throughout group session.     Plan: Continue to engage patient in RT group sessions 2-3x/week.    -McCall, LRT,CTRS 09/14/2022 12:47 PM

## 2022-09-15 MED ORDER — TRAZODONE HCL 50 MG PO TABS
50.0000 mg | ORAL_TABLET | Freq: Every evening | ORAL | Status: DC | PRN
  Administered 2022-09-15 – 2022-09-16 (×2): 50 mg via ORAL
  Filled 2022-09-15 (×2): qty 1

## 2022-09-15 NOTE — Group Note (Unsigned)
Date:  09/15/2022 Time:  10:04 AM  Group Topic/Focus:  Orientation:   The focus of this group is to educate the patient on the purpose and policies of crisis stabilization and provide a format to answer questions about their admission.  The group details unit policies and expectations of patients while admitted.     Participation Level:  {BHH PARTICIPATION LEVEL:22264}  Participation Quality:  {BHH PARTICIPATION QUALITY:22265}  Affect:  {BHH AFFECT:22266}  Cognitive:  {BHH COGNITIVE:22267}  Insight: {BHH Insight2:20797}  Engagement in Group:  {BHH ENGAGEMENT IN GROUP:22268}  Modes of Intervention:  {BHH MODES OF INTERVENTION:22269}  Additional Comments:  ***   D  09/15/2022, 10:04 AM  

## 2022-09-15 NOTE — Progress Notes (Signed)
Pt rates agitation 10/10 and anxiety 10/10. Pt frustrated that she has not been able to sleep for 2 nights and requesting sleep aid. Received one time dose from NP [place] for trazodone. Pt reports a good appetite, and no physical problems. Pt denies SI/HI/AVH and verbally contracts for safety. Provided support and encouragement. Pt safe on the unit. Q 15 minute safety checks continued.

## 2022-09-15 NOTE — Progress Notes (Signed)
   09/15/22 1952  CIWA-Ar  BP (!) 96/44  Pulse Rate 70

## 2022-09-15 NOTE — Group Note (Signed)
Date:  09/15/2022 Time:  12:18 PM  Group Topic/Focus:     SOCIAL WORK GROUP                                                   BHH LCSW Group Therapy Note    Group Date: @GROUPDATE @ Start Time: @GROUPSTARTTIME @ End Time: @GROUPENDTIME @  Type of Therapy and Topic:  Group Therapy:  Overcoming Obstacles  Participation Level:    Mood:  Description of Group:   In this group patients will be encouraged to explore what they see as obstacles to their own wellness and recovery. They will be guided to discuss their thoughts, feelings, and behaviors related to these obstacles. The group will process together ways to cope with barriers, with attention given to specific choices patients can make. Each patient will be challenged to identify changes they are motivated to make in order to overcome their obstacles. This group will be process-oriented, with patients participating in exploration of their own experiences as well as giving and receiving support and challenge from other group members.  Therapeutic Goals: 1. Patient will identify personal and current obstacles as they relate to admission. 2. Patient will identify barriers that currently interfere with their wellness or overcoming obstacles.  3. Patient will identify feelings, thought process and behaviors related to these barriers. 4. Patient will identify two changes they are willing to make to overcome these obstacles:    Summary of Patient Progress     Therapeutic Modalities:   Cognitive Behavioral Therapy Solution Focused Therapy Motivational Interviewing Relapse Prevention Therapy   Memory Dance    Participation Level:  Active  Participation Quality:  Appropriate  Affect:  Appropriate  Cognitive:  Appropriate  Insight: Appropriate  Engagement in Group:  Engaged  Modes of Intervention:  Discussion and Exploration  Additional Comments:    Memory Dance  09/15/2022, 12:18 PM
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
## 2022-09-15 NOTE — Progress Notes (Addendum)
   09/15/22 2034  CIWA-Ar  Nausea and Vomiting 1  Tactile Disturbances 1  Tremor 2  Auditory Disturbances 0  Paroxysmal Sweats 0  Visual Disturbances 0  Anxiety 2 (Patient reports "agitated")  Headache, Fullness in Head 3  Agitation 4  Orientation and Clouding of Sensorium 0  CIWA-Ar Total 13   Ativan as requested for reports of agitation. Zofran for nausea and Tylenol for complaints of headache. Pt. Reports recently consuming 1.5 bottles of wine a day. VS's WNL.

## 2022-09-15 NOTE — Progress Notes (Signed)
   09/15/22 1102  Psych Admission Type (Psych Patients Only)  Admission Status Voluntary  Psychosocial Assessment  Patient Complaints Agitation;Anxiety;Irritability  Eye Contact Fair  Facial Expression Anxious  Affect Anxious  Speech Logical/coherent  Interaction Assertive  Motor Activity Fidgety  Appearance/Hygiene Unremarkable  Behavior Characteristics Anxious;Agitated;Cooperative  Mood Anxious;Irritable  Thought Process  Coherency WDL  Content WDL  Delusions None reported or observed  Perception WDL  Hallucination None reported or observed  Judgment Poor  Confusion WDL  Danger to Self  Current suicidal ideation? Denies  Self-Injurious Behavior No self-injurious ideation or behavior indicators observed or expressed   Agreement Not to Harm Self Yes  Description of Agreement verbal  Danger to Others  Danger to Others None reported or observed

## 2022-09-15 NOTE — Group Note (Signed)
Date:  09/15/2022 Time:  10:09 AM  Group Topic/Focus:  Orientation:   The focus of this group is to educate the patient on the purpose and policies of crisis stabilization and provide a format to answer questions about their admission.  The group details unit policies and expectations of patients while admitted.    Participation Level:  Active  Participation Quality:  Appropriate  Affect:  Appropriate  Cognitive:  Appropriate  Insight: Appropriate  Engagement in Group:  Engaged  Modes of Intervention:  Discussion   Additional Comments:     Reymundo Poll 09/15/2022, 10:09 AM

## 2022-09-15 NOTE — [facility] Group Notes (Signed)
[facility] Group Notes:  (Nursing/MHT/Case Management/Adjunct)  Date:  09/15/2022  Time:  9:12 PM  Type of Therapy:   Wrap-up group  Participation Level:  Active  Participation Quality:  Appropriate  Affect:  Appropriate  Cognitive:  Appropriate  Insight:  Appropriate  Engagement in Group:  Engaged  Modes of Intervention:  Education  Summary of Progress/Problems: Pt goal to work on attitude. Day 3/10.   Noah Delaine 09/15/2022, 9:12 PM

## 2022-09-16 MED ORDER — HYDROXYZINE HCL 25 MG PO TABS
25.0000 mg | ORAL_TABLET | Freq: Three times a day (TID) | ORAL | Status: DC | PRN
  Administered 2022-09-16 – 2022-09-17 (×2): 25 mg via ORAL
  Filled 2022-09-16 (×2): qty 1

## 2022-09-16 MED ORDER — ESCITALOPRAM OXALATE 10 MG PO TABS
10.0000 mg | ORAL_TABLET | Freq: Every day | ORAL | Status: DC
  Administered 2022-09-17: 10 mg via ORAL
  Filled 2022-09-16 (×3): qty 1

## 2022-09-16 MED ORDER — LORAZEPAM 2 MG/ML IJ SOLN: 1.5000 mg | Freq: Three times a day (TID) | INTRAMUSCULAR | Status: DC | PRN

## 2022-09-16 MED ORDER — ARIPIPRAZOLE 5 MG PO TABS
5.0000 mg | ORAL_TABLET | Freq: Every day | ORAL | Status: DC
  Administered 2022-09-17 – 2022-09-19 (×3): 5 mg via ORAL
  Filled 2022-09-16 (×5): qty 1

## 2022-09-16 MED ORDER — LORAZEPAM 1 MG PO TABS
1.5000 mg | ORAL_TABLET | Freq: Three times a day (TID) | ORAL | Status: DC | PRN
  Administered 2022-09-16 – 2022-09-17 (×2): 1.5 mg via ORAL
  Filled 2022-09-16 (×2): qty 1

## 2022-09-16 NOTE — [facility] Group Notes (Signed)
[facility] Group Notes:  (Nursing/MHT/Case Management/Adjunct)  Date:  09/16/2022  Time:  8:47 PM  Type of Therapy:   AA group  Participation Level:  Active  Participation Quality:  Appropriate  Affect:  Appropriate  Cognitive:  Appropriate  Insight:  Appropriate  Engagement in Group:  Engaged  Modes of Intervention:  Education  Summary of Progress/Problems: Attended AA meeting.  Maureen Torres 09/16/2022, 8:47 PM

## 2022-09-16 NOTE — Progress Notes (Signed)
Zofran for complaints of nausea. Pt. With good BM this morning reported.

## 2022-09-16 NOTE — Group Note (Signed)
Date:  09/16/2022 Time:  4:40 PM  Group Topic/Focus:  Diagnosis Education:   The focus of this group is to discuss the major disorders that patients maybe diagnosed with.  Group discusses the importance of knowing what one's diagnosis is so that one can understand treatment and better advocate for oneself.    Participation Level:  Active  Participation Quality:  Appropriate  Affect:  Anxious  Cognitive:  Alert  Insight: Appropriate  Engagement in Group:  Engaged  Modes of Intervention:  Activity and Education  Additional Comments:  Pt engaged  Harriet Masson 09/16/2022, 4:40 PM

## 2022-09-16 NOTE — Hospital Course (Addendum)
Reason for admission: Suicidal ideation. Psychiatric diagnoses: Bipolar type 2 vs. MDD, Alcohol dependence.  Psychotropic medications: Escitalopram, Abilify, Ativian, Hydroxyzine.  Medical diagnoses: GERD Hospital course: Decreased Lexapro, increased abilify, decreased ativan, started hydroxyzine.  Depression improved, anxiety not improved. Symptoms c/w hypomania. Mood lability not improved.  Disposition: home, likely mid next week  To do over the weekend: Continue to down titrate Ativan and monitor anxiety and for hypomania.

## 2022-09-16 NOTE — Progress Notes (Signed)
Patient denies SI, HI and AVH. Rates depression 8/10 and anxiety 10/10 reports that she feels "everywhere" That she has had mostly bad days and that today she is experiencing a "hyper side" she feels restless. Patient reports that she had a bowel movement last night and slept well. Patient reports that she still feels nauseous occasionally. Patient is very focused on getting ativan. PRN given after patient reports she "went off" on someone during group this morning. Patient calm and pleasant throughout the rest of the day. Q 15 minute safety checks ongoing.    09/16/22 1300  Psych Admission Type (Psych Patients Only)  Admission Status Voluntary  Psychosocial Assessment  Patient Complaints Irritability;Anxiety  Eye Contact Fair  Facial Expression Anxious  Affect Irritable;Anxious  Speech Logical/coherent  Interaction Assertive  Motor Activity Fidgety  Appearance/Hygiene Unremarkable  Behavior Characteristics Cooperative;Anxious  Mood Preoccupied;Anxious;Pleasant  Thought Process  Coherency WDL  Content WDL  Delusions None reported or observed  Perception WDL  Hallucination None reported or observed  Judgment WDL  Confusion None  Danger to Self  Current suicidal ideation? Denies  Agreement Not to Harm Self Yes  Description of Agreement verbal  Danger to Others  Danger to Others None reported or observed
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
## 2022-09-16 NOTE — Progress Notes (Signed)
   09/16/22 2206  Psych Admission Type (Psych Patients Only)  Admission Status Voluntary  Psychosocial Assessment  Patient Complaints Anxiety  Eye Contact Fair  Facial Expression Anxious  Affect Appropriate to circumstance  Speech Logical/coherent  Interaction Assertive  Motor Activity Other (Comment) (WNL)  Appearance/Hygiene Unremarkable  Behavior Characteristics Cooperative  Mood Pleasant  Thought Process  Coherency WDL  Content WDL  Delusions None reported or observed  Perception WDL  Hallucination None reported or observed  Judgment WDL  Confusion None  Danger to Self  Current suicidal ideation? Denies  Agreement Not to Harm Self Yes  Description of Agreement verbal contract for safety  Danger to Others  Danger to Others None reported or observed   D: Patient in dayroom reports she had a good day and is able to engage therapeutically pt reported she enjoyed evening AA group. Pt observed interacting well with peers. A: Medications administered as prescribed. Support and encouragement provided as needed.  R: Patient remains safe on the unit. Plan of care ongoing for safety and stability.

## 2022-09-16 NOTE — Group Note (Signed)
Recreation Therapy Group Note   Group Topic:Team Building  Group Date: 09/16/2022 Start Time: 0932 End Time: 1000 Facilitators:  -McCall, LRT,CTRS Location: [address] Dayroom   Goal Area(s) Addresses:  Patient will effectively work with peer towards shared goal.  Patient will identify skills used to make activity successful.  Patient will identify how skills used during activity can be used to reach post d/c goals.   Group Description: Straw Bridge. In teams of 3-5, patients were given 15 plastic drinking straws and an equal length of masking tape. Using the materials provided, patients were instructed to build a free standing bridge-like structure to suspend an everyday item (ex: puzzle box) off of the floor or table surface. All materials were required to be used by the team in their design. LRT facilitated post-activity discussion reviewing team process. Patients were encouraged to reflect how the skills used in this activity can be generalized to daily life post discharge.    Affect/Mood: N/A   Participation Level: Did not attend    Clinical Observations/Individualized Feedback:     Plan: Continue to engage patient in RT group sessions 2-3x/week.    -McCall, LRT,CTRS 09/16/2022 12:00 PM

## 2022-09-16 NOTE — [facility] Group Notes (Signed)
Adult Psychoeducational Group Note  Date:  09/16/2022 Time:  10:41 AM  Group Topic/Focus:  Goals Group:   The focus of this group is to help patients establish daily goals to achieve during treatment and discuss how the patient can incorporate goal setting into their daily lives to aide in recovery. Orientation:   The focus of this group is to educate the patient on the purpose and policies of crisis stabilization and provide a format to answer questions about their admission.  The group details unit policies and expectations of patients while admitted.  Participation Level:  Active  Participation Quality:  Appropriate  Affect:  Appropriate and Blunted  Cognitive:  Appropriate  Insight: Good  Engagement in Group:  Engaged  Modes of Intervention:  Discussion and Education  Additional Comments:  Pt actively participated in group. Pt stated her goal for today is to manage her anger and not let others distract her. Pt was able to use her coping skills to decrease any negative emotions or thoughts she may have been experiencing.       09/16/2022, 10:41 AM

## 2022-09-17 DIAGNOSIS — G4700 Insomnia, unspecified: Secondary | ICD-10-CM | POA: Diagnosis present

## 2022-09-17 DIAGNOSIS — F1020 Alcohol dependence, uncomplicated: Secondary | ICD-10-CM | POA: Diagnosis present

## 2022-09-17 LAB — URINALYSIS, ROUTINE W REFLEX MICROSCOPIC
Bacteria, UA: NONE SEEN
Bilirubin Urine: NEGATIVE
Glucose, UA: NEGATIVE mg/dL
Ketones, ur: NEGATIVE mg/dL
Leukocytes,Ua: NEGATIVE
Nitrite: NEGATIVE
Protein, ur: NEGATIVE mg/dL
Specific Gravity, Urine: 1.016 (ref 1.005–1.030)
pH: 6 (ref 5.0–8.0)

## 2022-09-17 LAB — VITAMIN D 25 HYDROXY (VIT D DEFICIENCY, FRACTURES): Vit D, 25-Hydroxy: 20.04 ng/mL — ABNORMAL LOW (ref 30–100)

## 2022-09-17 LAB — LIPID PANEL
Cholesterol: 200 mg/dL (ref 0–200)
HDL: 65 mg/dL (ref >40)
LDL Cholesterol: 119 mg/dL — ABNORMAL HIGH (ref 0–99)
Total CHOL/HDL Ratio: 3.1 RATIO
Triglycerides: 78 mg/dL (ref <150)
VLDL: 16 mg/dL (ref 0–40)

## 2022-09-17 LAB — HEMOGLOBIN A1C
Hgb A1c MFr Bld: 4.6 % — ABNORMAL LOW (ref 4.8–5.6)
Mean Plasma Glucose: 85.32 mg/dL

## 2022-09-17 MED ORDER — HYDROXYZINE HCL 50 MG PO TABS
50.0000 mg | ORAL_TABLET | Freq: Three times a day (TID) | ORAL | Status: DC | PRN
  Administered 2022-09-17 – 2022-09-19 (×6): 50 mg via ORAL
  Filled 2022-09-17 (×6): qty 1

## 2022-09-17 MED ORDER — ONDANSETRON HCL 4 MG PO TABS
4.0000 mg | ORAL_TABLET | Freq: Three times a day (TID) | ORAL | Status: DC | PRN
  Administered 2022-09-18 – 2022-09-19 (×2): 4 mg via ORAL
  Filled 2022-09-17 (×3): qty 1

## 2022-09-17 MED ORDER — GABAPENTIN 100 MG PO CAPS
200.0000 mg | ORAL_CAPSULE | Freq: Three times a day (TID) | ORAL | Status: DC
  Administered 2022-09-17 – 2022-09-18 (×4): 200 mg via ORAL
  Filled 2022-09-17 (×12): qty 2

## 2022-09-17 MED ORDER — LORAZEPAM 2 MG/ML IJ SOLN: 1.0000 mg | Freq: Three times a day (TID) | INTRAMUSCULAR | Status: DC | PRN

## 2022-09-17 MED ORDER — TRAZODONE HCL 100 MG PO TABS
100.0000 mg | ORAL_TABLET | Freq: Every evening | ORAL | Status: DC | PRN
  Administered 2022-09-17 – 2022-09-18 (×2): 100 mg via ORAL
  Filled 2022-09-17 (×2): qty 1

## 2022-09-17 MED ORDER — ESCITALOPRAM OXALATE 20 MG PO TABS
20.0000 mg | ORAL_TABLET | Freq: Every day | ORAL | Status: DC
  Administered 2022-09-18 – 2022-09-19 (×2): 20 mg via ORAL
  Filled 2022-09-17 (×3): qty 1
  Filled 2022-09-17: qty 2
  Filled 2022-09-17: qty 1

## 2022-09-17 MED ORDER — LORAZEPAM 1 MG PO TABS
1.0000 mg | ORAL_TABLET | Freq: Three times a day (TID) | ORAL | Status: DC | PRN
  Administered 2022-09-17 – 2022-09-18 (×3): 1 mg via ORAL
  Filled 2022-09-17 (×3): qty 1

## 2022-09-17 NOTE — Group Note (Signed)
Date:  09/17/2022 Time:  11:28 AM  Group Topic/Focus:  Group Topic: @GROUPTOPIC @  Group Date: @GROUPDATE @ Start Time: @GROUPSTARTTIME @ End Time: @GROUPENDTIME @ Facilitators: @GROUPFACIL @  Department: @GROUPDEP @              Gwinda Maine, NT    Participation Level:  Active  Participation Quality:  Appropriate  Affect:  Appropriate  Cognitive:  Appropriate  Insight: Appropriate  Engagement in Group:  Engaged  Modes of Intervention:  Education  Additional Comments:  PT participated in all group areas   Gwinda Maine 09/17/2022, 11:28 AM

## 2022-09-17 NOTE — Group Note (Signed)
Date:  09/17/2022 Time:  10:02 PM  Group Topic/Focus:  Wrap-Up Group:   The focus of this group is to help patients review their daily goal of treatment and discuss progress on daily workbooks.  Interventions: Today's group therapy session focused on engaging the pts in a card game as a therapeutic activity. The card game chosen for today, selected for its collaborative and competitive elements, aimed at [employer], teamwork, and strategic thinking among the pts. The game allowed for structured interaction and provided opportunities for pts to practice skills such as turn-taking, decision-making, and managing frustration.   Maureen Torres   Participation Level:  Active  Participation Quality:  Appropriate  Affect:  Appropriate  Cognitive:  Appropriate  Insight: Good  Engagement in Group:  Engaged  Modes of Intervention:  Socialization  Additional Comments:    Maureen Torres 09/17/2022, 10:02 PM

## 2022-09-17 NOTE — Group Note (Deleted)
LCSW Group Therapy Note   Group Date: 09/17/2022 Start Time: 1000 End Time: 1100   Type of Therapy and Topic:  Group Therapy: Challenging Core Beliefs  Participation Level:  {B[facility] PARTICIPATION LEVEL:22264}  Description of Group:  Patients were educated about core beliefs and asked to identify one harmful core belief that they have. Patients were asked to explore from where those beliefs originate. Patients were asked to discuss how those beliefs make them feel and the resulting behaviors of those beliefs. They were then be asked if those beliefs are true and, if so, what evidence they have to support them. Lastly, group members were challenged to replace those negative core beliefs with helpful beliefs.   Therapeutic Goals:   1. Patient will identify harmful core beliefs and explore the origins of such beliefs. 2. Patient will identify feelings and behaviors that result from those core beliefs. 3. Patient will discuss whether such beliefs are true. 4.  Patient will replace harmful core beliefs with helpful ones.  Summary of Patient Progress:  *** actively engaged in processing and exploring how core beliefs are formed and how they impact thoughts, feelings, and behaviors. Patient proved open to input from peers and feedback from CSW. Patient demonstrated *** insight into the subject matter, was respectful and supportive of peers, and participated throughout the entire session.  Therapeutic Modalities: Cognitive Behavioral Therapy; Solution-Focused Therapy    A , LCSWA 09/17/2022  3:27 PM   
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
## 2022-09-17 NOTE — Group Note (Unsigned)
Date:  09/17/2022 Time:  2:16 PM  Group Topic/Focus:  Building Self Esteem:   The Focus of this group is helping patients become aware of the effects of self-esteem on their lives, the things they and others do that enhance or undermine their self-esteem, seeing the relationship between their level of self-esteem and the choices they make and learning ways to enhance self-esteem.     Participation Level:  {B[facility] PARTICIPATION LEVEL:22264}  Participation Quality:  {BHH PARTICIPATION QUALITY:22265}  Affect:  {BHH AFFECT:22266}  Cognitive:  {B[facility] COGNITIVE:22267}  Insight: {BHH Insight2:20797}  Engagement in Group:  {B[facility] ENGAGEMENT IN GROUP:22268}  Modes of Intervention:  {BHH MODES OF INTERVENTION:22269}  Additional Comments:  ***   D  09/17/2022, 2:16 PM  

## 2022-09-17 NOTE — Progress Notes (Signed)
   09/17/22 2100  Psych Admission Type (Psych Patients Only)  Admission Status Voluntary  Psychosocial Assessment  Patient Complaints Anxiety;Depression  Eye Contact Fair  Facial Expression Anxious  Affect Anxious;Appropriate to circumstance  Speech Logical/coherent  Interaction Assertive  Motor Activity Other (Comment) (WDL)  Appearance/Hygiene Unremarkable  Behavior Characteristics Cooperative;Appropriate to situation  Mood Anxious;Pleasant  Thought Process  Coherency WDL  Content WDL  Delusions None reported or observed  Perception WDL  Hallucination None reported or observed  Judgment Limited  Confusion None  Danger to Self  Current suicidal ideation? Denies  Agreement Not to Harm Self Yes  Description of Agreement verbal  Danger to Others  Danger to Others None reported or observed

## 2022-09-17 NOTE — Progress Notes (Signed)
Patient denies SI, HI and AVH. Rates depression a 6/10 and anxiety 8/10. PRN lorazepam administered this morning per patient's request and prior to group which leads to irritability per patient. Remains safe on the unit. Q 15 minute safety checks ongoing.   09/17/22 1000  Psych Admission Type (Psych Patients Only)  Admission Status Voluntary  Psychosocial Assessment  Patient Complaints Anxiety;Depression  Eye Contact Fair  Facial Expression Anxious  Affect Anxious;Appropriate to circumstance;Preoccupied  Speech Logical/coherent  Interaction Assertive  Motor Activity Other (Comment) (WDL)  Appearance/Hygiene Unremarkable  Behavior Characteristics Cooperative;Appropriate to situation  Mood Anxious;Pleasant  Thought Process  Coherency WDL  Content WDL  Delusions None reported or observed  Perception WDL  Hallucination None reported or observed  Judgment Limited  Confusion None  Danger to Self  Current suicidal ideation? Denies  Agreement Not to Harm Self Yes  Description of Agreement verbal  Danger to Others  Danger to Others None reported or observed

## 2022-09-18 MED ORDER — NALTREXONE HCL 50 MG PO TABS
25.0000 mg | ORAL_TABLET | Freq: Every day | ORAL | Status: DC
  Administered 2022-09-18 – 2022-09-19 (×2): 25 mg via ORAL
  Filled 2022-09-18 (×3): qty 1

## 2022-09-18 MED ORDER — GABAPENTIN 300 MG PO CAPS
300.0000 mg | ORAL_CAPSULE | Freq: Three times a day (TID) | ORAL | Status: DC
  Administered 2022-09-18 – 2022-09-19 (×2): 300 mg via ORAL
  Filled 2022-09-18 (×9): qty 1

## 2022-09-18 MED ORDER — VITAMIN D (ERGOCALCIFEROL) 1.25 MG (50000 UNIT) PO CAPS
50000.0000 [IU] | ORAL_CAPSULE | ORAL | Status: DC
  Administered 2022-09-18: 50000 [IU] via ORAL
  Filled 2022-09-18 (×2): qty 1

## 2022-09-18 NOTE — Group Note (Signed)
Date:  09/18/2022 Time:  3:03 PM  Group Topic/Focus:   Developing a Wellness Toolbox:   The focus of this group is to help patients develop a "wellness toolbox" with skills and strategies to promote recovery upon discharge. Skills and strategies discussed included eating healthy, exercise, maintaining appropriate physical health, keeping the brain stimulated with reading, puzzles, intellectual conversations, and being social.     Participation Level:  Active  Participation Quality:  Appropriate  Affect:  Appropriate  Cognitive:  Alert and Appropriate  Insight: Appropriate  Engagement in Group:  Engaged  Modes of Intervention:  Discussion and Education  Additional Comments:  n/a  Maureen Torres 09/18/2022, 3:03 PM

## 2022-09-18 NOTE — [facility] Group Notes (Signed)
[facility] Group Notes:  (Nursing/MHT/Case Management/Adjunct)  Date:  09/18/2022  Time:  9:10 PM  Type of Therapy:   Wrap-up group  Participation Level:  Active  Participation Quality:  Appropriate  Affect:  Appropriate  Cognitive:  Appropriate  Insight:  Appropriate  Engagement in Group:  Engaged  Modes of Intervention:  Education  Summary of Progress/Problems:Pt goal to improve attitude. Day 5/10.   Maureen Torres 09/18/2022, 9:10 PM

## 2022-09-18 NOTE — [facility] Group Notes (Signed)
[facility] Group Notes:  (Nursing/MHT/Case Management/Adjunct)  Date:  09/18/2022  Time:  9:02 AM  Type of Therapy:   Goals group  Participation Level:  Active  Participation Quality:  Appropriate  Affect:  Appropriate  Cognitive:  Appropriate  Insight:  Good  Engagement in Group:  Engaged  Modes of Intervention:  Discussion, Orientation, and Socialization  Summary of Progress/Problems: Goal is to get control of anxiety and manic phases.  Azalee Course 09/18/2022, 9:02 AM

## 2022-09-18 NOTE — Progress Notes (Signed)
[facility] MD Progress Note  09/18/2022 1:25 PM Maureen Torres  MRN:  [id number] Principal Problem: MDD (major depressive disorder), recurrent severe, without psychosis (HCC) Diagnosis: Principal Problem:   MDD (major depressive disorder), recurrent severe, without psychosis (HCC) Active Problems:   Gastroesophageal reflux disease   GAD (generalized anxiety disorder)   EtOH dependence (HCC)   Insomnia  Reason for Admission: Maureen Torres is a 37 y.o., female with PMH of  major depressive disorder, alcohol use, ADHD, self harm by cutting, untreated childhood trauma, no previous inpt psych admission, who presented with active suicidal ideation to [facility] Emergency Department (was then admitted for concerns of alcoholic hepatitis) via [facility] police, then transferred Voluntary to [facility] (09/12/2022) for suicidal ideation.   24 hr chart review: Patient has been visible on the unit, attending unit group sessions, no behavioral issues reported overnight.  Vital signs are within normal limits.  Patient received Ativan 1 mg 3 times over the past 24 hours.  Patient was educated yesterday that this medication is habit-forming, and as a result of this it will be stopped, and she will not be discharged on this medication.  Patient received hydroxyzine 50 mg last night and earlier today morning.  She required trazodone 100 mg last night for sleep.  Patient assessment note: Mood today is an improvement from patient's presentation yesterday; she is observed to be out in the day room, smiling, interacting with her peers and staff.  Patient denies SI/HI/AVH, denies paranoia, denies delusional thoughts.  She reports a good sleep quality last night, states that it was the best sleep she has had since admission, but reports that she had some vivid dreams regarding past trauma, but states that this was a one-time occurrence.  We discussed trauma causing the nightmares versus the side effects of trazodone being a culprit,  but patient states it has only happened once and that was last night, and she wants to stay on trazodone for now.  Patient reports that her appetite is less and fair but it is not poor.  She reports that she is making efforts to eat and to drink fluids.  She denies being in any physical pain today, denies medication related side effects.  Reports that she has noticed an improvement in her depressive symptoms and anxiety as compared to time of admission.  Patient reports that she had cravings for alcohol yesterday, and is agreeable to starting naltrexone at the low-dose due to some pre-existing nausea caused by her GERD.  Starting Vitamin D 50.000 units weekly for Vitamin D deficiency.   We will start patient on naltrexone 25 mg daily for alcohol use disorder, and will increase Gabapentin  to 300 mg TID for management of anxiety.  We are continuing other medications as listed below. The PDMR reviewed, and pt was not on Ativan prior to admission. It was most likely started in the ER prior to arrival to this hospital.  Ativan has been discontinued, as patient is presenting to the nurses station frequently and seeking for this medication.  We are continuing medications as listed below, continues hospitalization remains necessary, patient is having cravings and states that she wanted to drink alcohol yesterday.  She reports that if discharged yesterday she would have had some alcohol.  We started naltrexone, and it is necessary to keep patient hospitalized and reassess symptoms to determine if this medication is to be tapered upwards if nausea is not persistent.  Plan is to discharge patient on Tuesday 7/9 pending safety planning  and discharge follow-up appointments being completed.  Total Time spent with patient: 45 minutes  Past Psychiatric History: See H & P  Past Medical History:  Past Medical History:  Diagnosis Date   Acid reflux     Past Surgical History:  Procedure Laterality Date   CHOLECYSTECTOMY      rous and y     Family History:  Family History  Problem Relation Age of Onset   Hypertension Other    Pancreatitis Neg Hx    Family Psychiatric  History: See H & P Social History:  Social History   Substance and Sexual Activity  Alcohol Use Yes   Alcohol/week: 4.0 standard drinks of alcohol   Types: 4 Glasses of wine per week     Social History   Substance and Sexual Activity  Drug Use Yes   Types: Marijuana    Social History   Socioeconomic History   Marital status: Single    Spouse name: Not on file   Number of children: Not on file   Years of education: Not on file   Highest education level: Not on file  Occupational History   Not on file  Tobacco Use   Smoking status: Former    Types: Cigarettes   Smokeless tobacco: Never  Vaping Use   Vaping Use: Every day  Substance and Sexual Activity   Alcohol use: Yes    Alcohol/week: 4.0 standard drinks of alcohol    Types: 4 Glasses of wine per week   Drug use: Yes    Types: Marijuana   Sexual activity: Yes  Other Topics Concern   Not on file  Social History Narrative   Not on file   Social Determinants of Health   Financial Resource Strain: Not on file  Food Insecurity: Patient Declined (09/12/2022)   Hunger Vital Sign    Worried About Running Out of Food in the Last Year: Patient declined    Ran Out of Food in the Last Year: Patient declined  Transportation Needs: Patient Declined (09/12/2022)   PRAPARE - Administrator, Civil Service (Medical): Patient declined    Lack of Transportation (Non-Medical): Patient declined  Physical Activity: Not on file  Stress: Not on file  Social Connections: Not on file   Sleep: Poor  Appetite:  Fair  Current Medications: Current Facility-Administered Medications  Medication Dose Route Frequency Provider Last Rate Last Admin   acetaminophen (TYLENOL) tablet 650 mg  650 mg Oral Q6H PRN Maryagnes Amos, FNP   650 mg at 09/17/22 1302   alum & mag  hydroxide-simeth (MAALOX/MYLANTA) 200-200-20 MG/5ML suspension 30 mL  30 mL Oral Q4H PRN Maryagnes Amos, FNP   30 mL at 09/14/22 1806   ARIPiprazole (ABILIFY) tablet 5 mg  5 mg Oral Daily Margaretmary Dys, MD   5 mg at 09/18/22 0751   escitalopram (LEXAPRO) tablet 20 mg  20 mg Oral Daily Starleen Blue, NP   20 mg at 09/18/22 0749   folic acid (FOLVITE) tablet 1 mg  1 mg Oral Daily Maryagnes Amos, FNP   1 mg at 09/18/22 0751   gabapentin (NEURONTIN) capsule 300 mg  300 mg Oral TID Starleen Blue, NP       hydrOXYzine (ATARAX) tablet 50 mg  50 mg Oral TID PRN Starleen Blue, NP   50 mg at 09/18/22 0753   magnesium hydroxide (MILK OF MAGNESIA) suspension 30 mL  30 mL Oral Daily PRN Maryagnes Amos, FNP  30 mL at 09/15/22 0806   naltrexone (DEPADE) tablet 25 mg  25 mg Oral Daily , , NP       nicotine (NICODERM CQ - dosed in mg/24 hours) patch 21 mg  21 mg Transdermal Q24H Maryagnes Amos, FNP   21 mg at 09/18/22 1211   ondansetron (ZOFRAN) tablet 4 mg  4 mg Oral Q8H PRN Starleen Blue, NP       pantoprazole (PROTONIX) EC tablet 40 mg  40 mg Oral Daily Maryagnes Amos, FNP   40 mg at 09/18/22 0751   polyethylene glycol (MIRALAX / GLYCOLAX) packet 17 g  17 g Oral Daily PRN Maryagnes Amos, FNP   17 g at 09/18/22 0919   thiamine (Vitamin B-1) tablet 100 mg  100 mg Oral Daily Maryagnes Amos, FNP   100 mg at 09/18/22 0751   traZODone (DESYREL) tablet 100 mg  100 mg Oral QHS PRN Starleen Blue, NP   100 mg at 09/17/22 2109   Vitamin D (Ergocalciferol) (DRISDOL) 1.25 MG (50000 UNIT) capsule 50,000 Units  50,000 Units Oral Q7 days Starleen Blue, NP        Lab Results:  Results for orders placed or performed during the hospital encounter of 09/12/22 (from the past 48 hour(s))  Lipid panel     Status: Abnormal   Collection Time: 09/17/22  6:25 AM  Result Value Ref Range   Cholesterol 200 0 - 200 mg/dL   Triglycerides 78 <147  mg/dL   HDL 65 >82 mg/dL   Total CHOL/HDL Ratio 3.1 RATIO   VLDL 16 0 - 40 mg/dL   LDL Cholesterol 956 (H) 0 - 99 mg/dL    Comment:        Total Cholesterol/HDL:CHD Risk Coronary Heart Disease Risk Table                     Men   Women  1/2 Average Risk   3.4   3.3  Average Risk       5.0   4.4  2 X Average Risk   9.6   7.1  3 X Average Risk  23.4   11.0        Use the calculated Patient Ratio above and the CHD Risk Table to determine the patient's CHD Risk.        ATP III CLASSIFICATION (LDL):  <100     mg/dL   Optimal  213-086  mg/dL   Near or Above                    Optimal  130-159  mg/dL   Borderline  578-469  mg/dL   High  >629     mg/dL   Very High Performed at [facility], [address], [place], [place] [address]   Hemoglobin A1c     Status: Abnormal   Collection Time: 09/17/22  6:25 AM  Result Value Ref Range   Hgb A1c MFr Bld 4.6 (L) 4.8 - 5.6 %    Comment: (NOTE) Pre diabetes:          5.7%-6.4%  Diabetes:              >6.4%  Glycemic control for   <7.0% adults with diabetes    Mean Plasma Glucose 85.32 mg/dL    Comment: Performed at [facility], [address] [place], [place] [address]  VITAMIN D 25 Hydroxy (Vit-D Deficiency, Fractures)  Status: Abnormal   Collection Time: 09/17/22  6:25 AM  Result Value Ref Range   Vit D, 25-Hydroxy 20.04 (L) 30 - 100 ng/mL    Comment: (NOTE) Vitamin D deficiency has been defined by the Institute of Medicine  and an Endocrine Society practice guideline as a level of serum 25-OH  vitamin D less than 20 ng/mL (1,2). The Endocrine Society went on to  further define vitamin D insufficiency as a level between 21 and 29  ng/mL (2).  1. IOM (Institute of Medicine). 2010. Dietary reference intakes for  calcium and D. [place]: [employer]. 2. Holick MF, Binkley , Bischoff-Ferrari HA, et al. Evaluation,  treatment, and prevention of vitamin D deficiency: an Endocrine   Society clinical practice guideline, JCEM. 2011 Jul; 96(7): 1911-30.  Performed at [facility], [address] [place], [place] [address]   Urinalysis, Routine w reflex microscopic -Urine, Clean Catch     Status: Abnormal   Collection Time: 09/17/22 11:48 PM  Result Value Ref Range   Color, Urine YELLOW YELLOW   APPearance CLEAR CLEAR   Specific Gravity, Urine 1.016 1.005 - 1.030   pH 6.0 5.0 - 8.0   Glucose, UA NEGATIVE NEGATIVE mg/dL   Hgb urine dipstick SMALL (A) NEGATIVE   Bilirubin Urine NEGATIVE NEGATIVE   Ketones, ur NEGATIVE NEGATIVE mg/dL   Protein, ur NEGATIVE NEGATIVE mg/dL   Nitrite NEGATIVE NEGATIVE   Leukocytes,Ua NEGATIVE NEGATIVE   RBC / HPF 0-5 0 - 5 RBC/hpf   WBC, UA 6-10 0 - 5 WBC/hpf   Bacteria, UA NONE SEEN NONE SEEN   Squamous Epithelial / HPF 0-5 0 - 5 /HPF   Mucus PRESENT     Comment: Performed at [facility], [address] [place], [place] [address]    Blood Alcohol level:  Lab Results  Component Value Date   ETH 260 (H) 09/10/2022    Metabolic Disorder Labs: Lab Results  Component Value Date   HGBA1C 4.6 (L) 09/17/2022   MPG 85.32 09/17/2022   No results found for: "PROLACTIN" Lab Results  Component Value Date   CHOL 200 09/17/2022   TRIG 78 09/17/2022   HDL 65 09/17/2022   CHOLHDL 3.1 09/17/2022   VLDL 16 09/17/2022   LDLCALC 119 (H) 09/17/2022    Physical Findings: AIMS:  , ,  ,  ,    CIWA:  CIWA-Ar Total: 3 COWS:     Musculoskeletal: Strength & Muscle Tone: within normal limits Gait & Station: normal Patient leans: N/A  Psychiatric Specialty Exam:  Presentation  General Appearance:  Appropriate for Environment; Fairly Groomed  Eye Contact: Good  Speech: Clear and Coherent  Speech Volume: Normal  Handedness: Right   Mood and Affect  Mood: Euthymic  Affect: Appropriate; Congruent   Thought Process  Thought Processes: Coherent  Descriptions of  Associations:Intact  Orientation:Full (Time, Place and Person)  Thought Content:Logical  History of Schizophrenia/Schizoaffective disorder:No data recorded Duration of Psychotic Symptoms:No data recorded Hallucinations:Hallucinations: None  Ideas of Reference:None  Suicidal Thoughts:Suicidal Thoughts: No  Homicidal Thoughts:Homicidal Thoughts: No   Sensorium  Memory: Immediate Good  Judgment: Fair  Insight: Fair   Art therapist  Concentration: Fair  Attention Span: Fair  Recall: Fair  Fund of Knowledge: Fair  Language: Fair   Psychomotor Activity  Psychomotor Activity: Psychomotor Activity: Normal   Assets  Assets: Resilience   Sleep  Sleep: Sleep: Good    Physical Exam: Physical Exam Constitutional:  Appearance: Normal appearance.  Musculoskeletal:     Cervical back: Normal range of motion.  Neurological:     Mental Status: She is alert.    Review of Systems  Constitutional:  Negative for fever.  HENT:  Negative for hearing loss.   Eyes:  Negative for blurred vision.  Respiratory:  Negative for cough.   Cardiovascular:  Negative for chest pain.  Gastrointestinal:  Negative for heartburn.  Genitourinary:  Negative for dysuria.  Musculoskeletal:  Negative for myalgias.  Skin:  Negative for rash.  Neurological:  Negative for dizziness.  Psychiatric/Behavioral:  Positive for depression and substance abuse. Negative for hallucinations, memory loss and suicidal ideas. The patient is nervous/anxious and has insomnia.    Blood pressure 120/78, pulse 87, temperature 98.5 F (36.9 C), temperature source Oral, resp. rate 18, height 5' (1.524 m), weight 73 kg, SpO2 100 %. Body mass index is 31.44 kg/m.  Treatment Plan Summary: Daily contact with patient to assess and evaluate symptoms and progress in treatment and Medication management  Safety and Monitoring: Voluntary admission to inpatient psychiatric unit for safety,  stabilization and treatment Daily contact with patient to assess and evaluate symptoms and progress in treatment Patient's case to be discussed in multi-disciplinary team meeting Observation Level : q15 minute checks Vital signs: q12 hours Precautions: Safety  Long Term Goal(s): Improvement in symptoms so as ready for discharge  Short Term Goals: Ability to identify changes in lifestyle to reduce recurrence of condition will improve, Ability to verbalize feelings will improve, Ability to disclose and discuss suicidal ideas, Ability to demonstrate self-control will improve, Ability to identify and develop effective coping behaviors will improve, Ability to maintain clinical measurements within normal limits will improve, Compliance with prescribed medications will improve, and Ability to identify triggers associated with substance abuse/mental health issues will improve  Diagnoses Principal Problem:   MDD (major depressive disorder), recurrent severe, without psychosis (HCC) Active Problems:   Gastroesophageal reflux disease   GAD (generalized anxiety disorder)   EtOH dependence (HCC)   Insomnia  Medications -Start Naltrexone 25 mg daily for alcohol use disorder -Continue Lexapro to 20 mg daily starting 7/7 for MDD -Increase gabapentin to 300 mg 3 times daily for anxiety and alcohol use disorder -Start Vitamin D 50.000 units weekly for low Vit D levels  -Continue trazodone to 100 mg nightly for sleep starting 7/7 at 2100 -Continue Abilify 5 mg daily for augmentation of antidepressant -Continue Protonix 40 mg daily for GERD -Continue vitamin B1 100 mg daily -Continue nicotine patch 21 mg daily for nicotine addiction -Increase hydroxyzine to 50 mg 3 times daily as needed for anxiety -Discontinue Ativan  -Continue Zofran 4 mg 3 times daily as needed for nausea/vomiting  Other PRNS -Continue Tylenol 650 mg every 6 hours PRN for mild pain -Continue Maalox 30 mg every 4 hrs PRN for  indigestion -Continue Milk of Magnesia as needed every 6 hrs for constipation  Discharge Planning: Social work and case management to assist with discharge planning and identification of hospital follow-up needs prior to discharge Estimated LOS: 5-7 days Discharge Concerns: Need to establish a safety plan; Medication compliance and effectiveness Discharge Goals: Return home with outpatient referrals for mental health follow-up including medication management/psychotherapy  I certify that inpatient services furnished can reasonably be expected to improve the patient's condition.    Starleen Blue, NP 7/7/20241:25 PM    Starleen Blue, NP 09/18/2022, 1:25 PMPatient ID: Maureen Torres, female   DOB: 06/07/1985, 37 y.o.  MRN: [id number]

## 2022-09-18 NOTE — Progress Notes (Signed)
   09/18/22 1000  Psych Admission Type (Psych Patients Only)  Admission Status Voluntary  Psychosocial Assessment  Patient Complaints Anxiety;Depression  Eye Contact Fair  Facial Expression Anxious  Affect Anxious;Appropriate to circumstance  Speech Logical/coherent  Interaction Assertive;Needy  Motor Activity Other (Comment)  Appearance/Hygiene Unremarkable  Behavior Characteristics Cooperative;Unable to participate  Mood Anxious;Pleasant  Thought Process  Coherency WDL  Content WDL  Delusions None reported or observed  Perception WDL  Hallucination None reported or observed  Judgment Limited  Confusion None  Danger to Self  Current suicidal ideation? Denies  Agreement Not to Harm Self Yes  Description of Agreement verbal  Danger to Others  Danger to Others None reported or observed

## 2022-09-19 ENCOUNTER — Encounter (HOSPITAL_COMMUNITY): Payer: Self-pay

## 2022-09-19 DIAGNOSIS — F332 Major depressive disorder, recurrent severe without psychotic features: Secondary | ICD-10-CM

## 2022-09-19 MED ORDER — VITAMIN D (ERGOCALCIFEROL) 1.25 MG (50000 UNIT) PO CAPS: 50000.0000 [IU] | ORAL_CAPSULE | ORAL | 0 refills | Status: AC

## 2022-09-19 MED ORDER — HYDROXYZINE HCL 50 MG PO TABS: 50.0000 mg | ORAL_TABLET | Freq: Three times a day (TID) | ORAL | 0 refills | Status: DC | PRN

## 2022-09-19 MED ORDER — TRAZODONE HCL 100 MG PO TABS: 100.0000 mg | ORAL_TABLET | Freq: Every evening | ORAL | 0 refills | Status: DC | PRN

## 2022-09-19 MED ORDER — ESCITALOPRAM OXALATE 20 MG PO TABS: 20.0000 mg | ORAL_TABLET | Freq: Every day | ORAL | 0 refills | Status: DC

## 2022-09-19 MED ORDER — NALTREXONE HCL 50 MG PO TABS: 50.0000 mg | ORAL_TABLET | Freq: Every day | ORAL | 0 refills | Status: AC

## 2022-09-19 MED ORDER — NICOTINE 21 MG/24HR TD PT24: 21.0000 mg | MEDICATED_PATCH | TRANSDERMAL | 0 refills | Status: DC

## 2022-09-19 MED ORDER — ARIPIPRAZOLE 5 MG PO TABS: 5.0000 mg | ORAL_TABLET | Freq: Every day | ORAL | 0 refills | Status: DC

## 2022-09-19 MED ORDER — PANTOPRAZOLE SODIUM 40 MG PO TBEC: 40.0000 mg | DELAYED_RELEASE_TABLET | Freq: Every day | ORAL | 0 refills | Status: AC

## 2022-09-19 MED ORDER — GABAPENTIN 300 MG PO CAPS: 300.0000 mg | ORAL_CAPSULE | Freq: Three times a day (TID) | ORAL | 0 refills | Status: DC

## 2022-09-19 MED ORDER — NALTREXONE HCL 50 MG PO TABS
50.0000 mg | ORAL_TABLET | Freq: Every day | ORAL | Status: DC
  Filled 2022-09-19 (×2): qty 1

## 2022-09-19 MED ORDER — ACETAMINOPHEN 325 MG PO TABS: 650.0000 mg | ORAL_TABLET | Freq: Four times a day (QID) | ORAL | Status: AC | PRN

## 2022-09-19 NOTE — Progress Notes (Signed)
   09/19/22 0747  Psych Admission Type (Psych Patients Only)  Admission Status Voluntary  Psychosocial Assessment  Patient Complaints None  Eye Contact Fair  Facial Expression Animated  Affect Appropriate to circumstance  Speech Logical/coherent  Interaction Assertive  Motor Activity Other (Comment) (WDL)  Appearance/Hygiene Unremarkable  Behavior Characteristics Cooperative  Mood Pleasant  Thought Process  Coherency WDL  Content WDL  Delusions None reported or observed  Perception WDL  Hallucination None reported or observed  Judgment WDL  Confusion None  Danger to Self  Current suicidal ideation? Denies  Agreement Not to Harm Self Yes  Description of Agreement verbal  Danger to Others  Danger to Others None reported or observed

## 2022-09-19 NOTE — Discharge Instructions (Signed)
-  Follow-up with your outpatient psychiatric provider -instructions on appointment date, time, and address (location) are provided to you in discharge paperwork.  -Take your psychiatric medications as prescribed at discharge - instructions are provided to you in the discharge paperwork  -Follow-up with outpatient primary care doctor and other specialists -for management of preventative medicine and any chronic medical disease.  -Recommend abstinence from alcohol, tobacco, and other illicit drug use at discharge.   -If your psychiatric symptoms recur, worsen, or if you have side effects to your psychiatric medications, call your outpatient psychiatric provider, 911, [phone] or go to the nearest emergency department.  -If suicidal thoughts occur, call your outpatient psychiatric provider, 911, [phone] or go to the nearest emergency department.  Naloxone (Narcan) can help reverse an overdose when given to the victim quickly.  [place] offers free naloxone kits and instructions/training on its use.  Add naloxone to your first aid kit and you can help save a life.   Pick up your free kit at the following locations:   [place]:  [facility], [address] [place] [address] ([phone]) [facility] [address] [place] [address] ([phone]) [facility] [address] [place] [address]  [place]: [facility] [address] [place] [address] ([phone]) [facility] [address] [place] [address] ([phone])  

## 2022-09-19 NOTE — BH IP Treatment Plan (Signed)
Interdisciplinary Treatment and Diagnostic Plan Update  09/19/2022 Time of Session: 8:35 AM ( UPDATE)  Maureen Torres MRN: [id number]  Principal Diagnosis: MDD (major depressive disorder), recurrent severe, without psychosis (HCC)  Secondary Diagnoses: Principal Problem:   MDD (major depressive disorder), recurrent severe, without psychosis (HCC) Active Problems:   Gastroesophageal reflux disease   GAD (generalized anxiety disorder)   EtOH dependence (HCC)   Insomnia   Current Medications:  Current Facility-Administered Medications  Medication Dose Route Frequency Provider Last Rate Last Admin   acetaminophen (TYLENOL) tablet 650 mg  650 mg Oral Q6H PRN Maryagnes Amos, FNP   650 mg at 09/18/22 2114   alum & mag hydroxide-simeth (MAALOX/MYLANTA) 200-200-20 MG/5ML suspension 30 mL  30 mL Oral Q4H PRN Maryagnes Amos, FNP   30 mL at 09/14/22 1806   ARIPiprazole (ABILIFY) tablet 5 mg  5 mg Oral Daily Margaretmary Dys, MD   5 mg at 09/19/22 0746   escitalopram (LEXAPRO) tablet 20 mg  20 mg Oral Daily Starleen Blue, NP   20 mg at 09/19/22 0746   folic acid (FOLVITE) tablet 1 mg  1 mg Oral Daily Maryagnes Amos, FNP   1 mg at 09/19/22 0745   gabapentin (NEURONTIN) capsule 300 mg  300 mg Oral TID Starleen Blue, NP   300 mg at 09/19/22 0745   hydrOXYzine (ATARAX) tablet 50 mg  50 mg Oral TID PRN Starleen Blue, NP   50 mg at 09/19/22 0618   magnesium hydroxide (MILK OF MAGNESIA) suspension 30 mL  30 mL Oral Daily PRN Maryagnes Amos, FNP   30 mL at 09/15/22 0806   naltrexone (DEPADE) tablet 25 mg  25 mg Oral Daily Nkwenti, Tyler Aas, NP   25 mg at 09/19/22 0745   nicotine (NICODERM CQ - dosed in mg/24 hours) patch 21 mg  21 mg Transdermal Q24H Maryagnes Amos, FNP   21 mg at 09/19/22 0813   ondansetron (ZOFRAN) tablet 4 mg  4 mg Oral Q8H PRN Starleen Blue, NP   4 mg at 09/19/22 0619   pantoprazole (PROTONIX) EC tablet 40 mg  40 mg Oral Daily  Maryagnes Amos, FNP   40 mg at 09/19/22 0745   polyethylene glycol (MIRALAX / GLYCOLAX) packet 17 g  17 g Oral Daily PRN Maryagnes Amos, FNP   17 g at 09/18/22 0919   thiamine (Vitamin B-1) tablet 100 mg  100 mg Oral Daily Maryagnes Amos, FNP   100 mg at 09/19/22 0745   traZODone (DESYREL) tablet 100 mg  100 mg Oral QHS PRN Starleen Blue, NP   100 mg at 09/18/22 2113   Vitamin D (Ergocalciferol) (DRISDOL) 1.25 MG (50000 UNIT) capsule 50,000 Units  50,000 Units Oral Q7 days Starleen Blue, NP   50,000 Units at 09/18/22 1347   PTA Medications: Medications Prior to Admission  Medication Sig Dispense Refill Last Dose   acetaminophen (TYLENOL) 500 MG tablet Take 1,000 mg by mouth every 6 (six) hours as needed for mild pain.      escitalopram (LEXAPRO) 10 MG tablet Take 1 tablet (10 mg total) by mouth daily.      folic acid (FOLVITE) 1 MG tablet Take 1 tablet (1 mg total) by mouth daily.      GLUTAMINE PO Take 1 Dose by mouth daily.      medroxyPROGESTERone Acetate 150 MG/ML SUSY Inject 1 mL into the muscle every 3 (three) months.      nicotine (NICODERM CQ -  DOSED IN MG/24 HOURS) 21 mg/24hr patch Place 1 patch (21 mg total) onto the skin daily. 28 patch 0    pantoprazole (PROTONIX) 40 MG tablet Take 1 tablet (40 mg total) by mouth daily.      polyethylene glycol (MIRALAX / GLYCOLAX) 17 g packet Take 17 g by mouth daily as needed for mild constipation. 14 each 0    Probiotic Product (PROBIOTIC PO) Take 1 capsule by mouth daily.      thiamine (VITAMIN B-1) 100 MG tablet Take 1 tablet (100 mg total) by mouth daily.       Patient Stressors: Financial difficulties   Marital or family conflict   Substance abuse   Traumatic event    Patient Strengths: Capable of independent living  Manufacturing systems engineer  Physical Health  Work skills   Treatment Modalities: Medication Management, Group therapy, Case management,  1 to 1 session with clinician, Psychoeducation, Recreational  therapy.   Physician Treatment Plan for Primary Diagnosis: MDD (major depressive disorder), recurrent severe, without psychosis (HCC) Long Term Goal(s): Improvement in symptoms so as ready for discharge   Short Term Goals: Ability to identify changes in lifestyle to reduce recurrence of condition will improve Ability to verbalize feelings will improve Ability to disclose and discuss suicidal ideas Ability to demonstrate self-control will improve Ability to identify and develop effective coping behaviors will improve Ability to maintain clinical measurements within normal limits will improve Compliance with prescribed medications will improve Ability to identify triggers associated with substance abuse/mental health issues will improve  Medication Management: Evaluate patient's response, side effects, and tolerance of medication regimen.  Therapeutic Interventions: 1 to 1 sessions, Unit Group sessions and Medication administration.  Evaluation of Outcomes: Progressing  Physician Treatment Plan for Secondary Diagnosis: Principal Problem:   MDD (major depressive disorder), recurrent severe, without psychosis (HCC) Active Problems:   Gastroesophageal reflux disease   GAD (generalized anxiety disorder)   EtOH dependence (HCC)   Insomnia  Long Term Goal(s): Improvement in symptoms so as ready for discharge   Short Term Goals: Ability to identify changes in lifestyle to reduce recurrence of condition will improve Ability to verbalize feelings will improve Ability to disclose and discuss suicidal ideas Ability to demonstrate self-control will improve Ability to identify and develop effective coping behaviors will improve Ability to maintain clinical measurements within normal limits will improve Compliance with prescribed medications will improve Ability to identify triggers associated with substance abuse/mental health issues will improve     Medication Management: Evaluate patient's  response, side effects, and tolerance of medication regimen.  Therapeutic Interventions: 1 to 1 sessions, Unit Group sessions and Medication administration.  Evaluation of Outcomes: Progressing   RN Treatment Plan for Primary Diagnosis: MDD (major depressive disorder), recurrent severe, without psychosis (HCC) Long Term Goal(s): Knowledge of disease and therapeutic regimen to maintain health will improve  Short Term Goals: Ability to remain free from injury will improve, Ability to verbalize frustration and anger appropriately will improve, Ability to participate in decision making will improve, Ability to verbalize feelings will improve, Ability to identify and develop effective coping behaviors will improve, and Compliance with prescribed medications will improve  Medication Management: RN will administer medications as ordered by provider, will assess and evaluate patient's response and provide education to patient for prescribed medication. RN will report any adverse and/or side effects to prescribing provider.  Therapeutic Interventions: 1 on 1 counseling sessions, Psychoeducation, Medication administration, Evaluate responses to treatment, Monitor vital signs and CBGs as ordered, Perform/monitor CIWA,  COWS, AIMS and Fall Risk screenings as ordered, Perform wound care treatments as ordered.  Evaluation of Outcomes: Progressing   LCSW Treatment Plan for Primary Diagnosis: MDD (major depressive disorder), recurrent severe, without psychosis (HCC) Long Term Goal(s): Safe transition to appropriate next level of care at discharge, Engage patient in therapeutic group addressing interpersonal concerns.  Short Term Goals: Engage patient in aftercare planning with referrals and resources, Increase social support, Increase emotional regulation, Facilitate acceptance of mental health diagnosis and concerns, Identify triggers associated with mental health/substance abuse issues, and Increase skills  for wellness and recovery  Therapeutic Interventions: Assess for all discharge needs, 1 to 1 time with Social worker, Explore available resources and support systems, Assess for adequacy in community support network, Educate family and significant other(s) on suicide prevention, Complete Psychosocial Assessment, Interpersonal group therapy.  Evaluation of Outcomes: Progressing   Progress in Treatment: Attending groups: Yes. Participating in groups: Yes. Taking medication as prescribed: Yes. Toleration medication: Yes. Family/Significant other contact made: Yes, individual(s) contacted:  Ludger Nutting (boyfriend)- [phone] Patient understands diagnosis: Yes. Discussing patient identified problems/goals with staff: Yes. Medical problems stabilized or resolved: Yes. Denies suicidal/homicidal ideation: Yes. Issues/concerns per patient self-inventory: No.   New problem(s) identified: No, Describe:  None Reported   New Short Term/Long Term Goal(s):medication stabilization, elimination of SI thoughts, development of comprehensive mental wellness plan.    Patient Goals:  " Learn coping skills to control my thoughts "   Discharge Plan or Barriers: Patient recently admitted. CSW will continue to follow and assess for appropriate referrals and possible discharge planning.    Reason for Continuation of Hospitalization: Anxiety Depression Medication stabilization Suicidal ideation  Estimated Length of Stay: 1-3 days   Last 3 [place] Suicide Severity Risk Score: Flowsheet Row Ad[facility] (Current) [facility]-Admission (Discharged) from 09/10/2022 in [facility] ED from 01/19/2022 in [facility])  C-SSRS RISK CATEGORY High Risk High Risk No Risk       Last PHQ 2/9 Scores:     No data to display          Scribe for Treatment Team: Isabella Bowens, LCSWA 09/19/2022 8:30 AM

## 2022-09-19 NOTE — Progress Notes (Signed)
  [facility] Adult Case Management Discharge Plan :  Will you be returning to the same living situation after discharge:  Yes,   Ludger Nutting (boyfriend)- [phone] At discharge, do you have transportation home?: Yes,   Ludger Nutting (boyfriend)- [phone] Do you have the ability to pay for your medications: Yes,  Insured  Release of information consent forms completed and in the chart;  Patient's signature needed at discharge.  Patient to Follow up at:  Follow-up Information     [facility]. Go on 11/07/2022.   Specialty: Behavioral Health Why: You have an appointment for medication management services on 11/07/22 at 1:00 pm, in person.  You also have an appointment for therapy services on 11/08/22 at 2:00 pm with Madelaine Bhat, in person.  * Please give 24 hours notice if you cannot attend the appointment, as you will be unable to schedule appts after 2 no shows. * FOR FASTER SERVICE, please use walk in service at 7:00 am, Monday through Friday. Contact information: [address] [place] [address] [phone]        [facility]. Schedule an appointment as soon as possible for a visit.   Specialty: Hospice and Palliative Medicine Why: Please contact this provider to personally schedule an appointment for grief/bereavement therapy services. Contact information: [address] [place] [address] [phone]        [facility]. Go on 09/21/2022.   Specialty: Behavioral Health Why: You have an appointment on 09/21/22 at 1 p.m.  The substance abuse intensive outpatient program meets in-person M, W, F from 9 am to 12 pm and runs for 8 to 12 weeks.  Clients can also receive individual and family therapy and MAT.  There ius weekly drug testing.  The program is abstinence based and AA, NA, [facility], etc. attendance is encouraged.  For questions, please call [phone]. Contact information: [address]  [place] [address] [phone]                Next level of care provider has access to [facility] Link:yes  Safety Planning and Suicide Prevention discussed: Yes,   Ludger Nutting (boyfriend)- [phone]     Has patient been referred to the Quitline?: Patient refused referral for treatment  Patient has been referred for addiction treatment: Yes, the patient will follow up with an outpatient provider for substance use disorder. Psychiatrist/APP: appointment made and Therapist: appointment made Patient to continue working towards treatment goals after discharge. Patient no longer meets criteria for inpatient criteria per attending physician. Continue taking medications as prescribed, nursing to provide instructions at discharge. Follow up with all scheduled appointments.    S , LCSW 09/19/2022, 9:24 AM

## 2022-09-19 NOTE — Group Note (Signed)
Recreation Therapy Group Note   Group Topic:Team Building  Group Date: 09/19/2022 Start Time: 0930 End Time: 1005 Facilitators:  -McCall, LRT,CTRS Location: [address] Dayroom   Goal Area(s) Addresses:  Patient will effectively work with peer towards shared goal.  Patient will identify skills used to make activity successful.  Patient will identify how skills used during activity can be applied to reach post d/c goals.   Group Description: [employer]. In teams of 5-6, patients were given 11 craft pipe cleaners. Using the materials provided, patients were instructed to compete again the opposing team(s) to build the tallest free-standing structure from floor level. The activity was timed; difficulty increased by Clinical research associate as Production designer, theatre/television/film continued.  Systematically resources were removed with additional directions for example, placing one arm behind their back, working in silence, and shape stipulations. LRT facilitated post-activity discussion reviewing team processes and necessary communication skills involved in completion. Patients were encouraged to reflect how the skills utilized, or not utilized, in this activity can be incorporated to positively impact support systems post discharge.   Affect/Mood: Appropriate   Participation Level: Engaged   Participation Quality: Independent   Behavior: Appropriate   Speech/Thought Process: Focused   Insight: Good   Judgement: Good   Modes of Intervention: STEM Activity   Patient Response to Interventions:  Engaged   Education Outcome:  Acknowledges education   Clinical Observations/Individualized Feedback: Pt was bright and engaged. Pt worked well with peers in developing a plan and constructing their tower.     Plan: Continue to engage patient in RT group sessions 2-3x/week.    -McCall, LRT,CTRS 09/19/2022 11:52 AM

## 2022-09-19 NOTE — Discharge Summary (Signed)
Physician Discharge Summary Note  Patient:  Maureen Torres is an 37 y.o., female MRN:  [id number] DOB:  [birth year] Patient phone:  [phone] (home)  Patient address:   [address] [place] [address],  Total Time spent with patient: 45 minutes  Date of Admission:  09/12/2022 Date of Discharge: 09/19/2022  Reason for Admission:   Maureen Torres is a 37 y.o., female with PMH of  major depressive disorder, alcohol use, ADHD, self harm by cutting, untreated childhood trauma, no previous inpt psych admission, who presented with active suicidal ideation to [facility] Emergency Department (was then admitted for concerns of alcoholic hepatitis) via [facility] police, then transferred Voluntary to [facility] (09/12/2022) for suicidal ideation.    Principal Problem: MDD (major depressive disorder), recurrent severe, without psychosis (HCC) Discharge Diagnoses: Principal Problem:   MDD (major depressive disorder), recurrent severe, without psychosis (HCC) Active Problems:   Gastroesophageal reflux disease   GAD (generalized anxiety disorder)   EtOH dependence (HCC)   Insomnia  Past Psychiatric History: See H & P  Past Medical History:  Past Medical History:  Diagnosis Date   Acid reflux     Past Surgical History:  Procedure Laterality Date   CHOLECYSTECTOMY     rous and y     Family History:  Family History  Problem Relation Age of Onset   Hypertension Other    Pancreatitis Neg Hx    Family Psychiatric  History: See H & P Social History:  Social History   Substance and Sexual Activity  Alcohol Use Yes   Alcohol/week: 4.0 standard drinks of alcohol   Types: 4 Glasses of wine per week     Social History   Substance and Sexual Activity  Drug Use Yes   Types: Marijuana    Social History   Socioeconomic History   Marital status: Single    Spouse name: Not on file   Number of children: Not on file   Years of education: Not on file   Highest education level: Not on  file  Occupational History   Not on file  Tobacco Use   Smoking status: Former    Types: Cigarettes   Smokeless tobacco: Never  Vaping Use   Vaping Use: Every day  Substance and Sexual Activity   Alcohol use: Yes    Alcohol/week: 4.0 standard drinks of alcohol    Types: 4 Glasses of wine per week   Drug use: Yes    Types: Marijuana   Sexual activity: Yes  Other Topics Concern   Not on file  Social History Narrative   Not on file   Social Determinants of Health   Financial Resource Strain: Not on file  Food Insecurity: Patient Declined (09/12/2022)   Hunger Vital Sign    Worried About Running Out of Food in the Last Year: Patient declined    Ran Out of Food in the Last Year: Patient declined  Transportation Needs: Patient Declined (09/12/2022)   PRAPARE - Administrator, Civil Service (Medical): Patient declined    Lack of Transportation (Non-Medical): Patient declined  Physical Activity: Not on file  Stress: Not on file  Social Connections: Not on file   Hospital Course:   During the patient's hospitalization, patient had extensive initial psychiatric evaluation, and follow-up psychiatric evaluations every day. Psychiatric diagnoses provided upon initial assessment are as noted above. Patient's psychiatric medications were adjusted on admission as follows:  -Abilify 2 mg daily -Ativan 2 mg PO TID PRN  During the hospitalization, other adjustments were made to the patient's psychiatric medication regimen. Pt was agreeable to Ativan being completely weaned off prior to discharge.  Medications at discharge are as follows:   -Continue Naltrexone 50 mg daily for alcohol use disorder -Continue Lexapro to 20 mg daily starting for MDD -Continue gabapentin 300 mg 3 times daily for anxiety & ETOH use d/o -Continue Vitamin D 50.000 units weekly for low Vit D levels  -Continue trazodone to 100 mg nightly for sleep -Continue Abilify 5 mg daily for augmentation of  antidepressant -Continue Protonix 40 mg daily for GERD -Continue vitamin B1 100 mg daily -Continue nicotine patch 21 mg daily for nicotine addiction -Continue hydroxyzine 50 mg 3 times daily as needed for anxiety   Patient's care was discussed during the interdisciplinary team meeting every day during the hospitalization. The patient denies having side effects to prescribed psychiatric medication. Gradually, patient started adjusting to milieu. The patient was evaluated each day by a clinical provider to ascertain response to treatment. Improvement was noted by the patient's report of decreasing symptoms, improved sleep and appetite, affect, medication tolerance, behavior, and participation in unit programming.  Patient was asked each day to complete a self inventory noting mood, mental status, pain, new symptoms, anxiety and concerns.     Symptoms were reported as significantly decreased or resolved completely by discharge. On day of discharge, the patient reports that their mood is stable. The patient denied having suicidal thoughts for more than 48 hours prior to discharge.  Patient denies having homicidal thoughts.  Patient denies having auditory hallucinations.  Patient denies any visual hallucinations or other symptoms of psychosis. The patient was motivated to continue taking medication with a goal of continued improvement in mental health.    The patient reports their target psychiatric symptoms of depression, anxiety, insomnia & alcohol use cravings responded well to the psychiatric medications, and the patient reports overall benefit other psychiatric hospitalization. Supportive psychotherapy was provided to the patient. The patient also participated in regular group therapy while hospitalized. Coping skills, problem solving as well as relaxation therapies were also part of the unit programming.   Labs were reviewed with the patient, and abnormal results were discussed with the patient.  Educated patient on the need to f/u with his PCP regarding elevated LFTs which might be secondary to alcohol use.   The patient is able to verbalize their individual safety plan to this provider.   # It is recommended to the patient to continue psychiatric medications as prescribed, after discharge from the hospital.     # It is recommended to the patient to follow up with your outpatient psychiatric provider and PCP.   # It was discussed with the patient, the impact of alcohol, drugs, tobacco have been there overall psychiatric and medical wellbeing, and total abstinence from substance use was recommended the patient.ed.   # Prescriptions provided or sent directly to preferred pharmacy at discharge. Patient agreeable to plan. Given opportunity to ask questions. Appears to feel comfortable with discharge.    # In the event of worsening symptoms, the patient is instructed to call the crisis hotline (988), 911 and or go to the nearest ED for appropriate evaluation and treatment of symptoms. To follow-up with primary care provider for other medical issues, concerns and or health care needs   # Patient was discharged home with a plan to follow up as noted below.    Total Time spent with patient: 45 minutes  Physical Findings:  AIMS: 0 CIWA:  CIWA-Ar Total: 3 COWS:  0  Musculoskeletal: Strength & Muscle Tone: within normal limits Gait & Station: normal Patient leans: N/A  Psychiatric Specialty Exam:  Presentation  General Appearance:  Appropriate for Environment; Well Groomed  Eye Contact: Good  Speech: Clear and Coherent  Speech Volume: Normal  Handedness: Right  Mood and Affect  Mood: Euthymic  Affect: Congruent  Thought Process  Thought Processes: Coherent  Descriptions of Associations:Intact  Orientation:Full (Time, Place and Person)  Thought Content:Logical  History of Schizophrenia/Schizoaffective disorder:No data recorded Duration of Psychotic  Symptoms:No data recorded Hallucinations:Hallucinations: None  Ideas of Reference:None  Suicidal Thoughts:Suicidal Thoughts: No  Homicidal Thoughts:Homicidal Thoughts: No  Sensorium  Memory: Immediate Good  Judgment: Good  Insight: Good  Executive Functions  Concentration: Good  Attention Span: Good  Recall: Good  Fund of Knowledge: Good  Language: Good  Psychomotor Activity  Psychomotor Activity: Psychomotor Activity: Normal  Assets  Assets: Communication Skills   Sleep  Sleep: Sleep: Good  Physical Exam: Physical Exam Constitutional:      Appearance: Normal appearance.  Musculoskeletal:     Cervical back: Normal range of motion.  Neurological:     Mental Status: She is alert and oriented to person, place, and time.    Review of Systems  Constitutional:  Negative for fever.  HENT:  Negative for hearing loss.   Eyes:  Negative for blurred vision.  Respiratory:  Negative for cough.   Cardiovascular:  Negative for chest pain.  Gastrointestinal:  Negative for heartburn.  Genitourinary:  Negative for dysuria.  Musculoskeletal:  Negative for myalgias.  Skin:  Negative for rash.  Neurological:  Negative for dizziness.  Psychiatric/Behavioral:  Positive for depression (Denies SI/HI/AVH, denies any intent or plan to harm self or any one outside of [facility]) and substance abuse. Negative for hallucinations, memory loss and suicidal ideas. The patient is nervous/anxious and has insomnia.    Blood pressure 97/66, pulse 96, temperature 98.1 F (36.7 C), temperature source Oral, resp. rate 16, height 5' (1.524 m), weight 73 kg, SpO2 100 %. Body mass index is 31.44 kg/m.  Social History   Tobacco Use  Smoking Status Former   Types: Cigarettes  Smokeless Tobacco Never   Tobacco Cessation:  A prescription for an FDA-approved tobacco cessation medication provided at discharge   Blood Alcohol level:  Lab Results  Component Value Date   ETH 260  (H) 09/10/2022    Metabolic Disorder Labs:  Lab Results  Component Value Date   HGBA1C 4.6 (L) 09/17/2022   MPG 85.32 09/17/2022   No results found for: "PROLACTIN" Lab Results  Component Value Date   CHOL 200 09/17/2022   TRIG 78 09/17/2022   HDL 65 09/17/2022   CHOLHDL 3.1 09/17/2022   VLDL 16 09/17/2022   LDLCALC 119 (H) 09/17/2022   See Psychiatric Specialty Exam and Suicide Risk Assessment completed by Attending Physician prior to discharge.  Discharge destination:  Home  Is patient on multiple antipsychotic therapies at discharge:  No   Has Patient had three or more failed trials of antipsychotic monotherapy by history:  No  Recommended Plan for Multiple Antipsychotic Therapies: NA  Discharge Instructions     Diet - low sodium heart healthy   Complete by: As directed    Increase activity slowly   Complete by: As directed       Allergies as of 09/19/2022       Reactions   Aspirin Other (See Comments)   Stomach  ulcer   Ibuprofen Other (See Comments)   Gastric Bypass--advised to avoid   Nsaids Other (See Comments)   Gastric bypass Hx gastric bypass        Medication List     STOP taking these medications    GLUTAMINE PO   polyethylene glycol 17 g packet Commonly known as: MIRALAX / GLYCOLAX       TAKE these medications      Indication  acetaminophen 325 MG tablet Commonly known as: TYLENOL Take 2 tablets (650 mg total) by mouth every 6 (six) hours as needed for mild pain. What changed:  medication strength how much to take  Indication: Pain   ARIPiprazole 5 MG tablet Commonly known as: ABILIFY Take 1 tablet (5 mg total) by mouth daily. Start taking on: September 20, 2022  Indication: MIXED BIPOLAR AFFECTIVE DISORDER   escitalopram 20 MG tablet Commonly known as: LEXAPRO Take 1 tablet (20 mg total) by mouth daily. Start taking on: September 20, 2022 What changed:  medication strength how much to take  Indication: Generalized Anxiety  Disorder, Major Depressive Disorder   folic acid 1 MG tablet Commonly known as: FOLVITE Take 1 tablet (1 mg total) by mouth daily.  Indication: home med   gabapentin 300 MG capsule Commonly known as: NEURONTIN Take 1 capsule (300 mg total) by mouth 3 (three) times daily.  Indication: Alcohol Withdrawal Syndrome, Generalized Anxiety Disorder   hydrOXYzine 50 MG tablet Commonly known as: ATARAX Take 1 tablet (50 mg total) by mouth 3 (three) times daily as needed for itching or anxiety.  Indication: Feeling Anxious   medroxyPROGESTERone Acetate 150 MG/ML Susy Inject 1 mL into the muscle every 3 (three) months.  Indication: Birth Control Treatment   naltrexone 50 MG tablet Commonly known as: DEPADE Take 1 tablet (50 mg total) by mouth daily. Start taking on: September 20, 2022  Indication: Abuse or Misuse of Alcohol   nicotine 21 mg/24hr patch Commonly known as: NICODERM CQ - dosed in mg/24 hours Place 1 patch (21 mg total) onto the skin daily.  Indication: Nicotine Addiction   pantoprazole 40 MG tablet Commonly known as: PROTONIX Take 1 tablet (40 mg total) by mouth daily.  Indication: Gastroesophageal Reflux Disease   PROBIOTIC PO Take 1 capsule by mouth daily.  Indication: home med   thiamine 100 MG tablet Commonly known as: Vitamin B-1 Take 1 tablet (100 mg total) by mouth daily.  Indication: alcohol   traZODone 100 MG tablet Commonly known as: DESYREL Take 1 tablet (100 mg total) by mouth at bedtime as needed for sleep.  Indication: Trouble Sleeping, Major Depressive Disorder   Vitamin D (Ergocalciferol) 1.25 MG (50000 UNIT) Caps capsule Commonly known as: DRISDOL Take 1 capsule (50,000 Units total) by mouth every 7 (seven) days for 4 doses. Start taking on: September 25, 2022  Indication: Vitamin D Deficiency        Follow-up Information     [facility]. Go on 11/07/2022.   Specialty: Behavioral Health Why: You have an appointment for  medication management services on 11/07/22 at 1:00 pm, in person.  You also have an appointment for therapy services on 11/08/22 at 2:00 pm with Madelaine Bhat, in person.  * Please give 24 hours notice if you cannot attend the appointment, as you will be unable to schedule appts after 2 no shows. * FOR FASTER SERVICE, please use walk in service at 7:00 am, Monday through Friday. Contact information: [address] [place]  [address] [phone]        [facility]. Schedule an appointment as soon as possible for a visit.   Specialty: Hospice and Palliative Medicine Why: Please contact this provider to personally schedule an appointment for grief/bereavement therapy services. Contact information: [address] [place] [address] [phone]        [facility]. Go on 09/21/2022.   Specialty: Behavioral Health Why: You have an appointment on 09/21/22 at 1 p.m.  The substance abuse intensive outpatient program meets in-person M, W, F from 9 am to 12 pm and runs for 8 to 12 weeks.  Clients can also receive individual and family therapy and MAT.  There ius weekly drug testing.  The program is abstinence based and AA, NA, [facility], etc. attendance is encouraged.  For questions, please call [phone]. Contact information: [address] [place] [address] [phone]               Signed: Starleen Blue, NP 09/19/2022, 11:38 AM

## 2022-09-19 NOTE — Progress Notes (Signed)
Pt discharged at approximately 11:48 am alert and oriented x 4, calm and cooperative. Discharge instructions reviewed with pt and pt verbalized understanding to include but not limited to scheduled medications, follow up appointments, crisis hotline, safety plan, after visit summary, suicide risk assessment. Belongings returned to pt and belongings sheet signed with pt. Pt verbalized understanding of discharge instructions.

## 2022-09-19 NOTE — Progress Notes (Signed)
   09/19/22 0000  Psych Admission Type (Psych Patients Only)  Admission Status Voluntary  Psychosocial Assessment  Patient Complaints Anxiety;Depression  Eye Contact Fair  Facial Expression Anxious  Affect Anxious;Appropriate to circumstance  Speech Logical/coherent  Interaction Assertive;Needy  Motor Activity Other (Comment) (WDL)  Appearance/Hygiene Unremarkable  Behavior Characteristics Cooperative;Appropriate to situation  Mood Anxious;Pleasant  Thought Process  Coherency WDL  Content WDL  Delusions None reported or observed  Perception WDL  Hallucination None reported or observed  Judgment Limited  Confusion None  Danger to Self  Current suicidal ideation? Denies  Agreement Not to Harm Self Yes  Description of Agreement verbal  Danger to Others  Danger to Others None reported or observed
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
## 2022-09-19 NOTE — [facility] Group Notes (Signed)
Adult Psychoeducational Group Note  Date:  09/19/2022 Time:  11:18 AM  Group Topic/Focus:  Goals Group:   The focus of this group is to help patients establish daily goals to achieve during treatment and discuss how the patient can incorporate goal setting into their daily lives to aide in recovery.  Participation Level:  Active  Participation Quality:  Appropriate  Affect:  Appropriate  Cognitive:  Appropriate  Insight: Good  Engagement in Group:  Engaged  Modes of Intervention:  Discussion  Additional Comments:    [name] 09/19/2022, 11:18 AM

## 2022-09-21 ENCOUNTER — Encounter (HOSPITAL_COMMUNITY): Payer: Self-pay

## 2022-09-21 ENCOUNTER — Ambulatory Visit (HOSPITAL_COMMUNITY): Payer: Medicaid Other

## 2022-09-21 DIAGNOSIS — F1020 Alcohol dependence, uncomplicated: Secondary | ICD-10-CM

## 2022-09-21 DIAGNOSIS — F411 Generalized anxiety disorder: Secondary | ICD-10-CM

## 2022-09-21 DIAGNOSIS — F322 Major depressive disorder, single episode, severe without psychotic features: Secondary | ICD-10-CM
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
## 2022-09-23 ENCOUNTER — Ambulatory Visit (INDEPENDENT_AMBULATORY_CARE_PROVIDER_SITE_OTHER): Payer: Medicaid Other

## 2022-09-23 DIAGNOSIS — F1020 Alcohol dependence, uncomplicated: Secondary | ICD-10-CM

## 2022-09-23 DIAGNOSIS — F322 Major depressive disorder, single episode, severe without psychotic features: Secondary | ICD-10-CM

## 2022-09-23 DIAGNOSIS — F411 Generalized anxiety disorder: Secondary | ICD-10-CM

## 2022-09-23 NOTE — Progress Notes (Signed)
Daily Group Progress Note   Program: CD IOP   Group Time: 9 a.m. to 12:00 p.m.   Type of Therapy: Process and Psychoeducational   Topic: The therapist checks in with group members, assesses for SI/HI/psychosis and overall level of functioning. The therapist inquires about sobriety date and number of community support meetings attended since last session.   Therapist introduces new group member today. Therapist discusses the elements of CD-IOP, those being group, individual and family therapy.  Therapist educates group members that addiction is not just an individual's disease, rather it affects the entire family system. Therapist encourages those with family issues to invite their families to make an appointment.  Therapist facilitates a discussion of triggers to using and discussion was held about the trigger of money, using an example of asking to be paid with a check versus cash. Therapist prompted discussion on how to build as many barriers as possible to prevent use of drugs and alcohol.   Therapist discussed how people in active addiction can be a sabatage  to one's recovery, as the addicts, themselves are threatened about former using associates being in recovery, as they may have to entertain they have a problem.    Summary: Maureen Torres reports her depression as a "5" and her anxiety as an "8".  Members welcome her to the group.  She reports her sobriety date as 09-10-22.  She says she attended three meetings this week but has not gotten a sponsor yet.  She says she wants to do 90/90. Maureen Torres identifies her primary emotions today as "anxious and "hopeful" Maureen Torres says she feels anxious about everything.  She notes she is getting used to being back home.    Maureen Torres says she is staying away from her cell phone, as she has realized she cannot be there for everyone else and has to focus on her recovery.  She says she is going to reset her phone. Therapist discusses how she is going to have to make some  decisions as to who she wants to remain in her life. Maureen Torres voices her children have told her they are proud of her for her efforts, thus far. She discusses a using neighbor holding conversations telling her she can have just one drink.  Maureen Torres says because of the way she is natured (being too stubborn), she was not able to walk away from the conversation. Therapist explains how her sobriety is threatening the neighbors denial.Maureen Torres says she has found a job and is trying to end temptations for her to drink.    Progress Towards Goals: reports no use of alcohol   UDS collected:No  Results: No   AA/NA attended?: Yes   Sponsor?: No   Myrna Blazer, MA, LCSW, LCMHC, LCAS Remigio Eisenmenger, MS, LMFT, LCAS   09/23/2022

## 2022-09-26 ENCOUNTER — Ambulatory Visit (INDEPENDENT_AMBULATORY_CARE_PROVIDER_SITE_OTHER): Payer: Medicaid Other

## 2022-09-26 DIAGNOSIS — F1020 Alcohol dependence, uncomplicated: Secondary | ICD-10-CM | POA: Diagnosis not present

## 2022-09-26 DIAGNOSIS — F322 Major depressive disorder, single episode, severe without psychotic features: Secondary | ICD-10-CM

## 2022-09-26 DIAGNOSIS — F411 Generalized anxiety disorder: Secondary | ICD-10-CM

## 2022-09-26 NOTE — Progress Notes (Signed)
Daily Group Progress Note   Program: CD IOP   Group Time: 9 a.m. to 12:00p.m.   Type of Therapy: Process and Psychoeducational   Topic: The therapist checks in with group members, assesses for SI/HI/psychosis and overall level of functioning. The therapist inquires about sobriety date and number of community support meetings attended since last session.   Therapist uses the [employer] module chapter that delineates 12 step work and facilitates discussion, while explaining how being in "self will" can get a person in recovery stuck. Therapist additionally discusses how over generalizing can enable folks to talk them out of doing things they may should be acting on.   Therapist distributes Narcotics Anonymous Step Working Geophysicist/field seismologist.  Therapist facilitates conversation after therapist asks group members to peruse the material to see if they had any specific questions.  In response to some group members sharing their hesitancy about 12 step work,  therapist invited group members to take the material home and use it as a guide to begin doing 12 step work, as this material provides guidance on how to address the steps by providing specific questions that pertain to each step. Therapist presents on a module in the Matrix Model on Early Recovery , entitled "Five Common Challenges in Early Recovery.  The Five common challenges  includes the following:  1) Friends and Associates who continue to use, 2) Anger and Irritability, 3) Substances in the home, 4) Boredom and loneliness and 5) Special Occasions.  Summary: Maureen Torres who prefers to be called Maureen Torres rates her depression as a "4" and her anxiety as a "7". She identifies her two emotions today as "elated and happy". Maureen Torres reports she has the same sobriety date.  She says she has attended 3 meetings and got a sponsor since last IOP session. Maureen Torres says she asks her sponsor of her availability to be contacted.  Maureen Torres says she feels elated because her 26 year old  daughter decided to spend the rest of the summer with her. In response to a peer speaking of difficulty with the wording of the 12 steps, Maureen Torres shares with the group that there is an "app" entitled "Everything AA" and perhaps it would be helpful to listen if one has difficulty with the wording. Maureen Torres shares with the group that she is happy because her her children tell her how proud they are of her when she goes to a meeting or any activity associated with recovery.   Progress Towards Goals: Maureen Torres reports no use of alcohol    UDS collected: Yes  Results: no   AA/NA attended?: Yes   Sponsor?: Yes   Myrna Blazer, MA, LCSW, LCMHC, LCAS Remigio Eisenmenger, MS, LMFT, LCAS   09/26/2022

## 2022-09-28 ENCOUNTER — Encounter (HOSPITAL_COMMUNITY): Payer: Self-pay | Admitting: Medical

## 2022-09-28 ENCOUNTER — Encounter (HOSPITAL_COMMUNITY): Payer: Self-pay

## 2022-09-28 ENCOUNTER — Ambulatory Visit (INDEPENDENT_AMBULATORY_CARE_PROVIDER_SITE_OTHER): Payer: Self-pay | Admitting: Medical

## 2022-09-28 ENCOUNTER — Telehealth (HOSPITAL_COMMUNITY): Payer: Self-pay | Admitting: Licensed Clinical Social Worker

## 2022-09-28 VITALS — BP 103/70 | HR 64 | Ht 59.0 in | Wt 168.0 lb

## 2022-09-28 DIAGNOSIS — F341 Dysthymic disorder: Secondary | ICD-10-CM

## 2022-09-28 DIAGNOSIS — K852 Alcohol induced acute pancreatitis without necrosis or infection: Secondary | ICD-10-CM

## 2022-09-28 DIAGNOSIS — Z944 Liver transplant status: Secondary | ICD-10-CM

## 2022-09-28 DIAGNOSIS — T7422XS Child sexual abuse, confirmed, sequela: Secondary | ICD-10-CM

## 2022-09-28 DIAGNOSIS — F1020 Alcohol dependence, uncomplicated: Secondary | ICD-10-CM

## 2022-09-28 DIAGNOSIS — T7432XS Child psychological abuse, confirmed, sequela: Secondary | ICD-10-CM

## 2022-09-28 DIAGNOSIS — F322 Major depressive disorder, single episode, severe without psychotic features: Secondary | ICD-10-CM

## 2022-09-28 DIAGNOSIS — F4312 Post-traumatic stress disorder, chronic: Secondary | ICD-10-CM

## 2022-09-28 DIAGNOSIS — F411 Generalized anxiety disorder: Secondary | ICD-10-CM

## 2022-09-28 DIAGNOSIS — F418 Other specified anxiety disorders: Secondary | ICD-10-CM

## 2022-09-28 DIAGNOSIS — Z9889 Other specified postprocedural states: Secondary | ICD-10-CM

## 2022-09-28 DIAGNOSIS — K860 Alcohol-induced chronic pancreatitis: Secondary | ICD-10-CM
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
## 2022-09-28 NOTE — Telephone Encounter (Signed)
The therapist attempts to reach Maureen Torres leaving a HIPAA-compliant voicemail with his direct contact number.  Myrna Blazer, MA, LCSW, [facility], LCAS 09/28/2022

## 2022-09-29 ENCOUNTER — Telehealth (HOSPITAL_COMMUNITY): Payer: Self-pay | Admitting: Licensed Clinical Social Worker

## 2022-09-29 ENCOUNTER — Encounter (HOSPITAL_COMMUNITY): Payer: Self-pay | Admitting: Licensed Clinical Social Worker

## 2022-09-29 NOTE — Telephone Encounter (Signed)
The therapist receives a voicemail from GG having received the therapist's My Chart message.  She says that she was not in group yesterday as she was too depressed to get out of bed and apologizes for not calling. She says that she does want to continue with group and will attend tomorrow.  She says that she works today from 3 to 11 p.m. and attends an AA  meeting today at 12:10 p.m.   Myrna Blazer, MA, LCSW, [facility], LCAS 09/29/2022

## 2022-09-29 NOTE — Telephone Encounter (Signed)
The therapist again attempts to reach Maureen Torres leaving a HIPAA-compliant voicemail asking that she call this therapist back before day's end to let him know if she is planning on returning to class or not as therapist has a wait list so needs to know how to proceed.  The therapist also sends her a letter via My Chart asking that she call before day's end to let this therapist know if she is planning on returning due to having patients on a wait list.    Myrna Blazer, MA, LCSW, [facility], LCAS 09/29/2022

## 2022-09-30 ENCOUNTER — Ambulatory Visit (HOSPITAL_COMMUNITY): Payer: Medicaid Other

## 2022-09-30 DIAGNOSIS — F411 Generalized anxiety disorder: Secondary | ICD-10-CM

## 2022-09-30 DIAGNOSIS — F1020 Alcohol dependence, uncomplicated: Secondary | ICD-10-CM

## 2022-09-30 DIAGNOSIS — F4312 Post-traumatic stress disorder, chronic: Secondary | ICD-10-CM

## 2022-09-30 DIAGNOSIS — F322 Major depressive disorder, single episode, severe without psychotic features: Secondary | ICD-10-CM

## 2022-09-30 NOTE — Progress Notes (Signed)
Daily Group Progress Note   Program: [facility] IOP   Group Time: 9 a.m. to 12:00p.m.   Type of Therapy: Process and Psychoeducational   Topic: The therapist checks in with group members, assesses for SI/HI/psychosis and overall level of functioning. The therapist inquires about sobriety date and number of community support meetings attended since last session.  Therapists welcome and introduce a new group member.  After a group member reads from "A Daily Meditation", therapists facilitate discussion the meditation of "false pride", speficially when one thinks they can do recovery in solitude.  Therapists further discuss how having a "Higher Power" can provide a faith that helps people believe they can get through the suffering of emotions and see a purpose to their suffering.  Therapists prompt group members to share their stories of their journey. Group members share issues of shame and trauma in relation to being adult children of addicts.   In response to group members sharing their stories and using the verbiage "of when they started drinking alcoholically", therapists discuss the genetically predisposed factor in addicts emphasizing addiction did not just start when they started drinking heavier.  Therapists elaborate on how addiction is a chronic and progressive disease. Several group members share how their use of alcohol and drugs has eased their social anxiety. Therapists explain how using can cover up the other issues that group members are experiencing and that using only delays their processing those emotions.  Therapists discuss self-care for addicts, including workaholics.     Summary: Maureen Torres who prefers to be called Maureen Torres rates her depression as "7" and her anxiety as a "10". Maureen Torres report her sobriety date as the same. She states she wants to stay away from triggers.   Maureen Torres began sharing her story by saying she was IVC'd for being suicidal and was drunk.  She says she was sent to the  hospital.    Progress Towards Goals: Maureen Torres reports no use of alcohol.  Therapists inquired where Maureen Torres was as she missed group on Wednesday.  She shares that she was lying in bed crying.  Maureen Torres says that she does not want to share any more and leaves the room.  She did return for group wrap up.   Maureen Torres says her take away from group was she realizes she needs individual therapy.  It could not be scheduled at group time due to the [employer] outage across the country.  She agreed to meet for individual after group on Monday. Maureen Torres says she realized that she is not the only one dealing with trauma. Maureen Torres says it brought her emotions "up to the top" today.    UDS collected: no  Results: negative   AA/NA attended?: No   Sponsor?: Yes   Myrna Blazer, MA, LCSW, LCMHC, LCAS Remigio Eisenmenger, MS, LMFT, LCAS   09/30/2022

## 2022-10-03 ENCOUNTER — Other Ambulatory Visit (HOSPITAL_COMMUNITY): Payer: Self-pay | Admitting: Medical

## 2022-10-03 ENCOUNTER — Ambulatory Visit (HOSPITAL_COMMUNITY): Payer: Medicaid Other | Admitting: Medical

## 2022-10-03 DIAGNOSIS — F322 Major depressive disorder, single episode, severe without psychotic features: Secondary | ICD-10-CM

## 2022-10-03 DIAGNOSIS — F411 Generalized anxiety disorder: Secondary | ICD-10-CM

## 2022-10-03 DIAGNOSIS — F4312 Post-traumatic stress disorder, chronic: Secondary | ICD-10-CM

## 2022-10-03 DIAGNOSIS — F1020 Alcohol dependence, uncomplicated: Secondary | ICD-10-CM

## 2022-10-03 MED ORDER — HYDROXYZINE HCL 50 MG PO TABS: 50.0000 mg | ORAL_TABLET | Freq: Three times a day (TID) | ORAL | 0 refills | Status: DC | PRN

## 2022-10-03 MED ORDER — BACLOFEN 10 MG PO TABS: 10.0000 mg | ORAL_TABLET | Freq: Three times a day (TID) | ORAL | 1 refills | Status: DC

## 2022-10-03 MED ORDER — QUETIAPINE FUMARATE 25 MG PO TABS: ORAL_TABLET | ORAL | 2 refills | Status: DC

## 2022-10-03 MED ORDER — PREGABALIN 150 MG PO CAPS: 150.0000 mg | ORAL_CAPSULE | Freq: Three times a day (TID) | ORAL | 2 refills | Status: DC

## 2022-10-03 NOTE — Progress Notes (Signed)
Pt seen for FU on intake 09/29/22 when she was not in clinic Medications reviewed a nd intake done

## 2022-10-03 NOTE — Progress Notes (Unsigned)
Daily Group Progress Note  Program: [facility]   Group Time: 9 a.m. to 12 p.m.   Type of Therapy: Process and Psychoeducational   Topic: The therapists check in with group members, assess for SI/HI/psychosis and overall level of functioning. The therapists inquire about sobriety date and number of community support meetings attended since last session.   The therapists discuss how feeling shame and guilt over using is part of the addiction cycle and counterproductive. The therapists encourage group members to instead be solution-focused coming up with a relapse prevention plan aimed at determining how they will remove triggers and/or respond differently when confronted with the same triggers. The therapists point out that members can find meetings in the middle of the night, on-line in different time zones and in different countries. The therapists also explain how one should not wrestle with a thought or urge to drink in isolation but should tell on one's self via calling someone in the program or one's Sponsor to disclose that he or she is thinking of drinking. The therapists agree with one group member's suggestion that using relaxation, guided imagery audios prior to bedtime can help improve sleep while noting other sleep hygiene techniques.   The therapists begin showing the video, "Breaking the Addiction Cycle" with Delsa Sale having them complete the first exercise in the video concerning family attitudes about alcohol and drugs and concerning what they thought an alcoholic or addict looked like when they were children.     Summary: Maryelizabeth Kaufmann presents rating her depression as a "6" and her anxiety as an "8."   She describes her emotions as "indifferent" and somewhat "withdrawn." She notes that on the 28th of this month that she will have 30 days of sobriety. This weekend, she went to [place] with her kids attending a concert and riding an Art gallery manager. She added that this was different as she  was not under the influence of a substance when with her kids.   [name] noted that she has sleep problems similar to another group member with the same pattern of using late at night. She discloses that it is extremely difficult in breaking out of these old patterns as doing so feels unnatural. She meets with the PA-C today to hopefull have her sleep issues addressed.   Maryelizabeth Kaufmann says that her father drank; however, her grandmother was totally against drugs and alcohol. Maryelizabeth Kaufmann says that she viewed alcoholics as being homeless and drug addicts as people on the streets who were likely to rob you.   Progress Towards Goals: [name] reports no alcohol use.   UDS collected: Yes  Results: Yes, negative for drugs and alcohol  AA/NA attended?: Yes  Sponsor?: Yes   Myrna Blazer, MA, LCSW, LCMHC, LCAS Remigio Eisenmenger, MS, LMFT, LCAS 10/03/2022

## 2022-10-03 NOTE — Progress Notes (Addendum)
Error

## 2022-10-05 ENCOUNTER — Ambulatory Visit (INDEPENDENT_AMBULATORY_CARE_PROVIDER_SITE_OTHER): Payer: Medicaid Other | Admitting: Licensed Clinical Social Worker

## 2022-10-05 DIAGNOSIS — F322 Major depressive disorder, single episode, severe without psychotic features: Secondary | ICD-10-CM

## 2022-10-05 DIAGNOSIS — F1020 Alcohol dependence, uncomplicated: Secondary | ICD-10-CM | POA: Diagnosis not present

## 2022-10-05 DIAGNOSIS — F411 Generalized anxiety disorder: Secondary | ICD-10-CM

## 2022-10-05 DIAGNOSIS — F4312 Post-traumatic stress disorder, chronic: Secondary | ICD-10-CM

## 2022-10-05 NOTE — Progress Notes (Signed)
Daily Group Progress Note  Program: [facility]   Group Time: 9:25 a.m. to 12 p.m.   Type of Therapy: Process and Psychoeducational   Topic: The therapists check in with group members, assess for SI/HI/psychosis and overall level of functioning. The therapists inquire about sobriety date and number of community support meetings attended since last session.    The therapists focus today primarily on the necessity of learning to reach out to sober sobers in the program while noting that this is not a behavior that people in early recovery are comfortable in doing. The therapists emphasize the importance of honesty in recovery and in avoiding all people who use substances in addition to toxic relationships in which substance use is not involved as these types of relationships lead to emotional relapse which in turn will lead to behavioral relapse. The therapists validate that recovery is not a perfect thing but messy with real progress being two steps forward and one step back. Additionally, it is pointed out that times in which people feel unworthy or too ashamed to return to meetings is exactly when they need to go to meetings and that if they "keep coming back" that things will finally turn a corner. Lastly, the therapists observe that avoiding discomfort is at the core of addiction while learning to sit with uncomfortable emotions is a the core of recovery.    Summary: Maureen Torres presents late to group rating her depression as a "5" and her anxiety as an "10."   She describes her emotions as "grateful" and "frustrated." She says that she has not had any cravings in a week which she attributes to starting Baclofen. She says that she has attended two meetings since Monday but admits that she is "overwhelmed" and "isolated."   Maureen Torres says that she does not know how to communicate due to her social anxiety. She says that she does not like people. If people ask her to text them or reach out to them, then she will  not do so such that they have to contact her. The therapists explain how and why her Sponsor will likely have her start contacting other women in the program to start learning how to feel comfortable in reaching out to people so that if she is in need of support or thinking of using that she will be able to do so.  She says that her brother came to her house apparently drinking telling her "you act like you don't care" and telling her that she does not "show emotion." Maureen Torres says that she told him that this was because of the trauma she experienced from him and their father. Her brother told her that she should be calling their father. Maureen Torres says that if she shows emotion that she will cry which will in turn make her angry and be "ready to fight."  The therapists explain that it is not her brother's business or not whether she calls their father and suggest that she ask him to not come to her house drinking or under the influence and to ask him to leave if he does so.   Progress Towards Goals: Maureen Torres reports no alcohol use.   UDS collected: No  Results: No  AA/NA attended?: Yes  Sponsor?: Yes   Myrna Blazer, MA, LCSW, LCMHC, LCAS Remigio Eisenmenger, MS, LMFT, LCAS 10/05/2022

## 2022-10-06 ENCOUNTER — Ambulatory Visit (INDEPENDENT_AMBULATORY_CARE_PROVIDER_SITE_OTHER): Payer: Medicaid Other | Admitting: Licensed Clinical Social Worker

## 2022-10-06 DIAGNOSIS — F322 Major depressive disorder, single episode, severe without psychotic features: Secondary | ICD-10-CM

## 2022-10-06 DIAGNOSIS — F4312 Post-traumatic stress disorder, chronic: Secondary | ICD-10-CM

## 2022-10-06 DIAGNOSIS — F411 Generalized anxiety disorder: Secondary | ICD-10-CM

## 2022-10-06 DIAGNOSIS — F1020 Alcohol dependence, uncomplicated: Secondary | ICD-10-CM

## 2022-10-06 NOTE — Progress Notes (Signed)
Daily Progress Note  Program: [facility]   Individual Time: 10:15 a.m. to 11 a.m.   Type of Therapy: Individual    Interventions/Therapist Response: CBT/The therapist validates that Maureen Torres is dealing with numerous stressors at the same time. He encourages her to focus on doing the bare minimum that she needs to do and to practice self-care. He discusses with her how to allow her kids to experience the logical and natural consequences of their behavior and the need to be consistent with enforcing consequences.  He models how to set limits with her brother and others assertively such that she does not go "from a zero to one-hundred" and start hollering. He explains that the Seroquel that the PA-C recently started her on should help with mood stability and recommends that she talk with him about Prazosin as an option for her PTSD-related nightmares.   He encourages her to talk with her Sponsor.   Summary: Maureen Torres presents saying she is still sober and having cravings but her Baclofen has helped. She says that her mood can "go from one end to the next." She has been having dreams related to past trauma which has caused her distress during the day after these dreams. She says that she cut a week ago on the day that she did not come to group; however, it was very superficial like a cat scratch.   The major focus of the session involves [name]'s need to set limits with her kids and with her brother while utilizing self-care as opposed to maladaptive problem-solving in the form of drinking, passivity, or cutting.   Plan: Return again in 1 weeks.  Diagnosis: Alcohol Use Disorder, Severe; Major Depression, Single, Episode, Severe, GAD, and PTSD  Collaboration of Care: Other N/A  Patient/Guardian was advised Release of Information must be obtained prior to any record release in order to collaborate their care with an outside provider. Patient/Guardian was advised if they have not already done so to contact the  registration department to sign all necessary forms in order for [place] to release information regarding their care.   Consent: Patient/Guardian gives verbal consent for treatment and assignment of benefits for services provided during this visit. Patient/Guardian expressed understanding and agreed to proceed.   Myrna Blazer, MA, LCSW, [facility], LCAS 10/06/2022

## 2022-10-07 ENCOUNTER — Encounter (HOSPITAL_COMMUNITY): Payer: Self-pay | Admitting: Medical

## 2022-10-07 ENCOUNTER — Ambulatory Visit (INDEPENDENT_AMBULATORY_CARE_PROVIDER_SITE_OTHER): Payer: Medicaid Other | Admitting: Medical

## 2022-10-07 ENCOUNTER — Other Ambulatory Visit (HOSPITAL_COMMUNITY): Payer: Self-pay | Admitting: Medical

## 2022-10-07 DIAGNOSIS — F1020 Alcohol dependence, uncomplicated: Secondary | ICD-10-CM

## 2022-10-07 DIAGNOSIS — F411 Generalized anxiety disorder: Secondary | ICD-10-CM | POA: Diagnosis not present

## 2022-10-07 DIAGNOSIS — F322 Major depressive disorder, single episode, severe without psychotic features: Secondary | ICD-10-CM

## 2022-10-07 DIAGNOSIS — Z79899 Other long term (current) drug therapy: Secondary | ICD-10-CM | POA: Diagnosis not present

## 2022-10-07 MED ORDER — PRAZOSIN HCL 1 MG PO CAPS: ORAL_CAPSULE | ORAL | 0 refills | Status: DC

## 2022-10-07 MED ORDER — TRAZODONE HCL 150 MG PO TABS: 150.0000 mg | ORAL_TABLET | Freq: Every day | ORAL | 0 refills | Status: DC

## 2022-10-07 NOTE — Progress Notes (Addendum)
Daily Group Progress Note   Program: [facility] IOP     Group Time: 9 a.m. to 12 p.m.    Type of Therapy: Process and Psychoeducational    Topic: The therapist checks in with group members, assesses for SI/HI/psychosis and overall level of functioning. The therapist inquires about sobriety date and number of community support meetings attended since last session.    Therapists introduce the new group member. Therapists resume showing the video "Breaking the  Cycle" by Shanna Cisco, RN.  The cycle including the following elements beginning with "Belief System, Impaired Thinking and Unmanageable".  The next assignment was for group members to identify their favorite impaired thought that they told themselves before they admitted they had an addiction. Therapists emphasized the concept of changing the belief changes the cycle.  Therapists discussed the importance of getting group members to understand that addiction is brain based, chronic and progressive disease.    Summary:  G.G. rated her depression at "5 and her anxiety at 7".  Esau Grew Says her emotions are "overwhelmed and anxious". G.G asked this therapist if mouth wash would be a positive for alcohol.   She says she accidentally swallowed some.  Therapist told her if the mouth wash has alcohol then it may test positive for alcohol.  G.G. says she has the same sobriety date. G.G. reports what she told herself that made her think she is not an addict: "I only drink I social setting, can maintain a job and only drank to go to sleep".   Progress Towards Goals: reports she has not drank or used drugs   UDS collected: No Results: Negative   AA/NA attended?: Yes   Sponsor?: Yes  Remigio Eisenmenger, M.S., LMFT, LCAS [address] M.A., [place], [facility], LCAS 10-07-22

## 2022-10-07 NOTE — Progress Notes (Signed)
Pt began to experience nightmares after initial interview.Met with Counselor 1:1 andreported nightmares had started She confirms same. Rx Prazosin started  FU next week

## 2022-10-10 ENCOUNTER — Telehealth (HOSPITAL_COMMUNITY): Payer: Self-pay | Admitting: Licensed Clinical Social Worker

## 2022-10-10 ENCOUNTER — Encounter (HOSPITAL_COMMUNITY): Payer: Self-pay

## 2022-10-10 ENCOUNTER — Ambulatory Visit (HOSPITAL_COMMUNITY): Payer: Medicaid Other

## 2022-10-10 NOTE — Telephone Encounter (Signed)
The therapist attempts to reach Maureen Torres in response to her no show today leaving a HIPAA-compliant voicemail.  Myrna Blazer, MA, LCSW, [facility], LCAS 10/10/2022

## 2022-10-12 ENCOUNTER — Ambulatory Visit (INDEPENDENT_AMBULATORY_CARE_PROVIDER_SITE_OTHER): Payer: Medicaid Other

## 2022-10-12 ENCOUNTER — Other Ambulatory Visit (HOSPITAL_COMMUNITY): Payer: Self-pay | Admitting: Medical

## 2022-10-12 DIAGNOSIS — F411 Generalized anxiety disorder: Secondary | ICD-10-CM

## 2022-10-12 DIAGNOSIS — F322 Major depressive disorder, single episode, severe without psychotic features: Secondary | ICD-10-CM

## 2022-10-12 DIAGNOSIS — F1020 Alcohol dependence, uncomplicated: Secondary | ICD-10-CM | POA: Diagnosis not present

## 2022-10-12 MED ORDER — ESCITALOPRAM OXALATE 20 MG PO TABS: 20.0000 mg | ORAL_TABLET | Freq: Every day | ORAL | 2 refills | Status: DC

## 2022-10-12 MED ORDER — ARIPIPRAZOLE 5 MG PO TABS: 5.0000 mg | ORAL_TABLET | Freq: Every day | ORAL | 0 refills | Status: DC

## 2022-10-12 NOTE — Progress Notes (Signed)
Daily Group Progress Note   Program: [facility] IOP     Group Time: 9 a.m. to 12 p.m.    Type of Therapy: Process and Psychoeducational    Topic: The therapist checks in with group members, assesses for SI/HI/psychosis and overall level of functioning. The therapist inquires about sobriety date and number of community support meetings attended since last session.    Therapists discuss the importance of scheduling and having structure in one life, providing examples such as sleep schedules and meal schedules. Therapists emphasize the importance of scheduling personal life around recovery, rather than recovery around personal life, as recovery must take priority.  Therapists discuss how in early recovery, many people look to external controls as a means of maintaining sobriety, however if one does not switch to internal controls then long term sobriety is not likely to occur.  Therapists emphasize one's struggle is against the disease of addiction, and not against the family, treatment providers, etc.     Summary:  G.G. rated her depression at "5 and her anxiety at  as "8".  G.G. shares her emotions today are "anxious and indifferent".  G.G. explains she missed group on Monday due to a doctor's appointment and forgot to let the therapists know.  G.G. shares that she and her sponsor are getting to know one another. She reports the same sobriety date.  G.G. says she feels anxious about everything. She shares it ran though her mind that she needed some wine but did not drink.G.G. reports feeling some indifference about the situation with her boyfriend.  She says there are people telling her things that have nothing to do with her, specifically her boyfriend.  Therapist suggests bringing him in for a family session. G.G. says she will talk to him about doing this and will get back to therapist.   Progress Towards Goals: reports she has not drank or used drugs   UDS collected: No Results: No   AA/NA attended?:  Yes   Sponsor?: Yes  Remigio Eisenmenger, M.S., LMFT, LCAS [address] M.A., [place], [facility], LCAS 10-12-22

## 2022-10-12 NOTE — Progress Notes (Signed)
Pt running low on Meds per Counselor.Abilify and Lexapro reordered

## 2022-10-13 ENCOUNTER — Ambulatory Visit (INDEPENDENT_AMBULATORY_CARE_PROVIDER_SITE_OTHER): Payer: Medicaid Other | Admitting: Licensed Clinical Social Worker

## 2022-10-13 DIAGNOSIS — F322 Major depressive disorder, single episode, severe without psychotic features: Secondary | ICD-10-CM

## 2022-10-13 DIAGNOSIS — F411 Generalized anxiety disorder: Secondary | ICD-10-CM

## 2022-10-13 DIAGNOSIS — F4312 Post-traumatic stress disorder, chronic: Secondary | ICD-10-CM

## 2022-10-13 DIAGNOSIS — F1020 Alcohol dependence, uncomplicated: Secondary | ICD-10-CM

## 2022-10-13 NOTE — Progress Notes (Signed)
Daily Progress Note  Program: [facility] IOP   Individual Time: 10:15 a.m. to 11 a.m.   Type of Therapy: Individual    Interventions/Therapist Response: CBT/The therapist normalizes her anxiety over calling her Sponsor encouraging her to admit this to her Sponsor and to process it with her. The therapist notes that after Maureen Torres gets comfortable with calling her Sponsor and eventually other women in the program that this will become her new normal for dealing with stress versus her old pattern of drinking.   Summary: Maureen Torres presents saying that she has not started the Seroquel as they are doing the prior auth. She is taking the Prazosin and is no longer having nightmares. She did not get a chance to talk with her boyfriend about coming in for a conjoint session.   The major focus of the session involves [name]'s problems with her boyfriend who believes she is having an affair in spite of her informing him to the contrary.  The session also focuses on her fear of talking to her Sponsor on the phone given that her hands will sweat if she has to call her. She says that she will call her when she knows she is at work and not available.   At the same time she talks about what to do about her thoughts of using when under stress.   Plan: Return again in 1 weeks. She will talk to her boyfriend about coming in for a conjoint session and let the therapist know what he says when she sees him in IOP.   Diagnosis: Alcohol Use Disorder, Severe; Major Depression, Single, Episode, Severe, GAD, and PTSD  Collaboration of Care: Other N/A  Patient/Guardian was advised Release of Information must be obtained prior to any record release in order to collaborate their care with an outside provider. Patient/Guardian was advised if they have not already done so to contact the registration department to sign all necessary forms in order for [place] to release information regarding their care.   Consent: Patient/Guardian gives verbal  consent for treatment and assignment of benefits for services provided during this visit. Patient/Guardian expressed understanding and agreed to proceed.   Myrna Blazer, MA, LCSW, [facility], LCAS 10/13/2022

## 2022-10-14 ENCOUNTER — Ambulatory Visit (INDEPENDENT_AMBULATORY_CARE_PROVIDER_SITE_OTHER): Payer: Medicaid Other

## 2022-10-14 DIAGNOSIS — F322 Major depressive disorder, single episode, severe without psychotic features: Secondary | ICD-10-CM

## 2022-10-14 DIAGNOSIS — F411 Generalized anxiety disorder: Secondary | ICD-10-CM

## 2022-10-14 DIAGNOSIS — F1020 Alcohol dependence, uncomplicated: Secondary | ICD-10-CM | POA: Diagnosis not present

## 2022-10-14 DIAGNOSIS — F4312 Post-traumatic stress disorder, chronic: Secondary | ICD-10-CM

## 2022-10-14 NOTE — Progress Notes (Signed)
Daily Group Progress Note   Program: [facility] IOP     Group Time: 9 a.m. to 12 p.m.    Type of Therapy: Process and Psychoeducational    Topic: The therapist checks in with group members, assesses for SI/HI/psychosis and overall level of functioning. The therapist inquires about sobriety date and number of community support meetings attended since last session.    Therapists show video of Jaymes Graff, RN, Breaking the cycle of Addiction. Therapists discusses the element of un-manageability in the cycle.  Therapists had group members identify when they recognized their addiction was unmanageable. Therapist discuss the preoccupation as a part of the cycle of addiction and emphasize the importance is not with trying to keep thoughts from entering the mind about using, as they will come. Therapists discuss the point of intervention is not in trying to not have the using thoughts, rather focus on what one can do something about, that being interrupting the cycle of ritual.  Summary:  G.G. describes her point of un-manageability was her impulsiveness. She shared she did things she was ashamed of.  When describing her ritual of use, she reports she watched the clock from 7 a.m. onward. She says she had a certain cup she used to drink but no longer has that. She reports she has hiding places and a stashes. G.G. says she is working 3rd shift and would complete her household chores around drinking wine.  She states she was kidding herself and would tell her that she did not feel good because of her "allergies".   G.G. reports her take away from group today is about ritual use and how she needs to stay clear of places where use if possible   Progress Towards Goals: reports she has not drank or used drugs   UDS collected: No Results: No   AA/NA attended?: Yes   Sponsor?: Yes  Remigio Eisenmenger, M.S., LMFT, LCAS [address] M.A., LCSW, [facility], LCAS 10-14-22

## 2022-10-17 ENCOUNTER — Ambulatory Visit (HOSPITAL_COMMUNITY): Payer: Medicaid Other

## 2022-10-17 ENCOUNTER — Telehealth (HOSPITAL_COMMUNITY): Payer: Self-pay | Admitting: Licensed Clinical Social Worker

## 2022-10-17 ENCOUNTER — Encounter (HOSPITAL_COMMUNITY): Payer: Self-pay

## 2022-10-17 NOTE — Telephone Encounter (Signed)
Therapist receives a voicemail from Maureen Torres saying that she was up until 3 a.m. so overslept for group but plans on attending on Wednesday.   Maureen Blazer, MA, LCSW, [facility], LCAS 10/17/2022

## 2022-10-17 NOTE — Telephone Encounter (Signed)
The therapist attempts to reach [name] leaving a HIPAA-compliant voicemail.  Myrna Blazer, MA, LCSW, [facility], LCAS 10/17/2022

## 2022-10-19 ENCOUNTER — Other Ambulatory Visit (HOSPITAL_COMMUNITY): Payer: Self-pay | Admitting: Medical

## 2022-10-19 ENCOUNTER — Ambulatory Visit (INDEPENDENT_AMBULATORY_CARE_PROVIDER_SITE_OTHER): Payer: Medicaid Other

## 2022-10-19 DIAGNOSIS — F322 Major depressive disorder, single episode, severe without psychotic features: Secondary | ICD-10-CM

## 2022-10-19 DIAGNOSIS — F4312 Post-traumatic stress disorder, chronic: Secondary | ICD-10-CM

## 2022-10-19 DIAGNOSIS — F1020 Alcohol dependence, uncomplicated: Secondary | ICD-10-CM | POA: Diagnosis not present

## 2022-10-19 DIAGNOSIS — F411 Generalized anxiety disorder: Secondary | ICD-10-CM

## 2022-10-19 MED ORDER — ARIPIPRAZOLE 5 MG PO TABS: 5.0000 mg | ORAL_TABLET | Freq: Every day | ORAL | 0 refills | Status: DC

## 2022-10-19 NOTE — Progress Notes (Signed)
Daily Group Progress Note   Program: [facility] IOP     Group Time: 9 a.m. to 12 p.m.    Type of Therapy: Process and Psychoeducational    Topic: The therapist checks in with group members, assesses for SI/HI/psychosis and overall level of functioning. The therapist inquires about sobriety date and number of community support meetings attended since last session.    The therapists discuss  addiction as a medical disease, specifically how cravings are a symptom of a medical disease, rather than a character defect. Therapists discuss when addicts use alcohol and drugs to number themselves out, they maintain schedules that are not healthy and when they stop using they can have more healthy schedules. Therapists discuss that in order to enjoy early recovery while exploring activities to participate in or reconnecting to an activity they enjoyed prior to active addiction..  Summary:  G.G. rates her depression as "5" and her anxiety as "9". She says she is "overwhelmed".  G.G. says she relapsed on THC yesterday.  G.G. says her trigger was her "minor flip out". She says she called someone to get [facility].  G.G. says she called her sponsor earlier in the day yesterday but couldn't get up with her.  Therapist says that she looks exhausted and her attendance is getting into "here but not here" and this is not trending well. Therapist discusses the importance of balancing one's re life and recovery.  Therapist reminds of the phrase "easy does it". G.G. says she is working more 3rd shifts so she can make more money.  Therapist discusses how working these hours is not working because she gets off at [place] and is exhausted and comes in to IOP.  Therapist discusses how H.A.L.T (Hungry, Angry, Lonely and Tired) can led to relapse. Therapist asks what she can do.  G.G. says she can give up working the third shift, after already working another the day before [facility] IOP.    Progress Towards Goals: reports she relapsed yesterday and new  sober date is today (10-19-22)   UDS collected: No Results: No   AA/NA attended?: Yes   Sponsor?: Yes  Remigio Eisenmenger, M.S., LMFT, LCAS [address], M.A., [place], [facility], LCAS 10-19-22

## 2022-10-19 NOTE — Progress Notes (Signed)
Pt told counselor rx for ailify not done Dont know why but have heard [facility] has hAD COMPUTER ISSUES SINCE mICROSOFT oul upRx sent

## 2022-10-21 ENCOUNTER — Ambulatory Visit (INDEPENDENT_AMBULATORY_CARE_PROVIDER_SITE_OTHER): Payer: Medicaid Other

## 2022-10-21 DIAGNOSIS — F4312 Post-traumatic stress disorder, chronic: Secondary | ICD-10-CM

## 2022-10-21 DIAGNOSIS — F411 Generalized anxiety disorder: Secondary | ICD-10-CM

## 2022-10-21 DIAGNOSIS — F1020 Alcohol dependence, uncomplicated: Secondary | ICD-10-CM | POA: Diagnosis not present

## 2022-10-21 DIAGNOSIS — F322 Major depressive disorder, single episode, severe without psychotic features: Secondary | ICD-10-CM

## 2022-10-21 NOTE — Progress Notes (Signed)
Daily Group Progress Note   Program: CD IOP     Group Time: 9 a.m. to 12 p.m.    Type of Therapy: Process and Psychoeducational    Topic: The therapist checks in with group members, assesses for SI/HI/psychosis and overall level of functioning. The therapist inquires about sobriety date and number of community support meetings attended since last session.    Therapists observe that people relapse often claiming that they did so for no particular reason; however, therapists illustrate how this is not true per the fundamental principle of behavior therapy; "all behavior is purposeful." Therapists explain that people in active addiction often use substances to get rid of what they perceive to be "bad" emotions; however, therapists debunk the myth of bad emotions pointing out that that emotions are neither good or bad nor are they right or wrong but that emotions are like indicators or guide posts telling a person what to do. For example, anxiety tells people that they need to slow down or lighten their load while depression tells them that they are lacking enough pleasurable activities or positive social interactions.  Therapists illustrate how addiction is a disease that impacts the entire family system explaining family systems theory and why family therapy is a necessary part of addiction treatment. Therapists also discuss how one's psychiatric and/or substance use diagnosis can be used as a weapon by dysfunctional family members trying to silence them. Lastly, therapists discuss the importance of "coming out" as an alcoholic or addict and how this connects to taking the First Step of the Twelve Steps.   Summary:  G.G. rates her depression as "4" and her anxiety as "8". G.G. identifies her emotions as "anxious and rested".  G.G. says she did speak to her sponsor last night thought she has not made a meeting. Therapist inquired what is going on that she was unable to attend a meeting.  G.G. shares that  time constraint is a factor. Therapist informs her that there are virtual meetings and can locate these online.  G.G. says her take away from today's group is that it is "ok" not to be ashamed to let someone know she is an alcoholic".   Progress Towards Goals: reports no use of alcohol or drugs   UDS collected: No Results: Negative   AA/NA attended?: No   Sponsor?: Yes  Remigio Eisenmenger, M.S., LMFT, LCAS [address] M.A., LCSW, [facility], LCAS 10-21-22

## 2022-10-24 ENCOUNTER — Encounter (HOSPITAL_COMMUNITY): Payer: Self-pay

## 2022-10-24 ENCOUNTER — Telehealth (HOSPITAL_COMMUNITY): Payer: Self-pay | Admitting: Licensed Clinical Social Worker

## 2022-10-24 ENCOUNTER — Ambulatory Visit (HOSPITAL_COMMUNITY): Payer: Medicaid Other

## 2022-10-24 NOTE — Telephone Encounter (Signed)
As Maureen Torres does not show for group today, the therapist attempts to reach her leaving a HIPAA-compliant voicemail requesting a callback. The therapist observes that Maureen Torres has missed three consecutive Mondays.  Myrna Blazer, MA, LCSW, [facility], LCAS 10/24/2022

## 2022-10-26 ENCOUNTER — Ambulatory Visit (HOSPITAL_COMMUNITY): Payer: Medicaid Other

## 2022-10-26 ENCOUNTER — Encounter (HOSPITAL_COMMUNITY): Payer: Self-pay

## 2022-10-28 ENCOUNTER — Ambulatory Visit (HOSPITAL_COMMUNITY): Payer: Medicaid Other

## 2022-10-31 ENCOUNTER — Other Ambulatory Visit (HOSPITAL_COMMUNITY): Payer: Self-pay | Admitting: Medical

## 2022-10-31 ENCOUNTER — Ambulatory Visit (HOSPITAL_COMMUNITY): Payer: Medicaid Other

## 2022-11-01 ENCOUNTER — Telehealth (HOSPITAL_COMMUNITY): Payer: Self-pay

## 2022-11-01 NOTE — Telephone Encounter (Signed)
Pharmacy faxed a 90 day request for patients Trazodone. Last filled on 10/07/22. She has an appointment with [place] on 11/07/2022. Please review and advise, thank you

## 2022-11-02 ENCOUNTER — Ambulatory Visit (HOSPITAL_COMMUNITY): Payer: Medicaid Other

## 2022-11-03 ENCOUNTER — Encounter (HOSPITAL_COMMUNITY): Payer: Self-pay

## 2022-11-03 ENCOUNTER — Telehealth (HOSPITAL_COMMUNITY): Payer: Self-pay | Admitting: Psychiatry

## 2022-11-03 NOTE — Telephone Encounter (Signed)
Nightmares

## 2022-11-03 NOTE — Telephone Encounter (Signed)
Called and LVM stating to call our office back to confirm New Patient appointment by [facility] tomorrow, other wise the appointment is going to be cancelled. Also sent a My chart message stating the same thing

## 2022-11-04 ENCOUNTER — Ambulatory Visit (HOSPITAL_COMMUNITY): Payer: Medicaid Other

## 2022-11-04 ENCOUNTER — Encounter (HOSPITAL_COMMUNITY): Payer: Self-pay

## 2022-11-04 NOTE — Telephone Encounter (Signed)
Called patient again stating that Monday's appointment has been cancelled due to non-confirmation. Stated to call our office back by 4pm today if she would like to get it rescheduled

## 2022-11-07 ENCOUNTER — Ambulatory Visit (HOSPITAL_COMMUNITY): Payer: Medicaid Other | Admitting: Psychiatry

## 2022-11-08 ENCOUNTER — Ambulatory Visit (HOSPITAL_COMMUNITY): Payer: Medicaid Other | Admitting: Licensed Clinical Social Worker

## 2022-11-09 ENCOUNTER — Ambulatory Visit (HOSPITAL_COMMUNITY): Payer: Medicaid Other | Admitting: Licensed Clinical Social Worker

## 2022-11-16 ENCOUNTER — Ambulatory Visit (INDEPENDENT_AMBULATORY_CARE_PROVIDER_SITE_OTHER): Payer: Medicaid Other | Admitting: Licensed Clinical Social Worker

## 2022-11-16 DIAGNOSIS — F1021 Alcohol dependence, in remission: Secondary | ICD-10-CM

## 2022-11-16 DIAGNOSIS — F39 Unspecified mood [affective] disorder: Secondary | ICD-10-CM
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
## 2022-11-25 ENCOUNTER — Encounter (HOSPITAL_COMMUNITY): Payer: Self-pay | Admitting: Psychiatry

## 2022-11-25 ENCOUNTER — Ambulatory Visit (INDEPENDENT_AMBULATORY_CARE_PROVIDER_SITE_OTHER): Payer: Medicaid Other | Admitting: Psychiatry

## 2022-11-25 DIAGNOSIS — F331 Major depressive disorder, recurrent, moderate: Secondary | ICD-10-CM | POA: Insufficient documentation

## 2022-11-25 DIAGNOSIS — F411 Generalized anxiety disorder: Secondary | ICD-10-CM

## 2022-11-25 DIAGNOSIS — F1094 Alcohol use, unspecified with alcohol-induced mood disorder: Secondary | ICD-10-CM | POA: Diagnosis not present

## 2022-11-25 DIAGNOSIS — F4310 Post-traumatic stress disorder, unspecified: Secondary | ICD-10-CM

## 2022-11-25 DIAGNOSIS — F17200 Nicotine dependence, unspecified, uncomplicated: Secondary | ICD-10-CM

## 2022-11-25 DIAGNOSIS — F17290 Nicotine dependence, other tobacco product, uncomplicated: Secondary | ICD-10-CM

## 2022-11-25 MED ORDER — NICOTINE 21 MG/24HR TD PT24
21.0000 mg | MEDICATED_PATCH | TRANSDERMAL | 3 refills | Status: DC
Start: 2022-11-25 — End: 2023-02-03

## 2022-11-25 MED ORDER — GABAPENTIN 300 MG PO CAPS
300.0000 mg | ORAL_CAPSULE | Freq: Three times a day (TID) | ORAL | 3 refills | Status: DC
Start: 2022-11-25 — End: 2023-02-03

## 2022-11-25 MED ORDER — PRAZOSIN HCL 2 MG PO CAPS
ORAL_CAPSULE | ORAL | 3 refills | Status: DC
Start: 2022-11-25 — End: 2023-02-03

## 2022-11-25 MED ORDER — QUETIAPINE FUMARATE 100 MG PO TABS
100.0000 mg | ORAL_TABLET | Freq: Every day | ORAL | 3 refills | Status: DC
Start: 2022-11-25 — End: 2022-12-19

## 2022-11-25 MED ORDER — HYDROXYZINE HCL 50 MG PO TABS
50.0000 mg | ORAL_TABLET | Freq: Three times a day (TID) | ORAL | 3 refills | Status: DC | PRN
Start: 2022-11-25 — End: 2022-12-19
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
## 2022-11-28 ENCOUNTER — Telehealth (HOSPITAL_COMMUNITY): Payer: Self-pay

## 2022-11-28 DIAGNOSIS — Z0000 Encounter for general adult medical examination without abnormal findings: Secondary | ICD-10-CM

## 2022-11-28 NOTE — Telephone Encounter (Signed)
90 day request sent by pharmacy for patients Baclofen 10 mg. Patient was last seen 9/13 and has a follow up on 11/22. Please review and advise, thank you

## 2022-11-29 NOTE — Telephone Encounter (Signed)
I have never filled this for her. It looks like it was last filled by Dr. Maryjean Morn.

## 2022-11-29 NOTE — Telephone Encounter (Signed)
Baclofen is a muscle relaxer and that writer does not fill muscle relaxers.  If the patient needs to be referred to primary care provider is willing to do this.

## 2022-11-30 ENCOUNTER — Other Ambulatory Visit (HOSPITAL_COMMUNITY): Payer: Self-pay | Admitting: Medical

## 2022-11-30 MED ORDER — BACLOFEN 10 MG PO TABS: 10.0000 mg | ORAL_TABLET | Freq: Three times a day (TID) | ORAL | 2 refills | Status: AC

## 2022-11-30 NOTE — Progress Notes (Signed)
Chat from Isac Sarna NP Psychiatry seeing pt for medication management Pt requesting Baclofen refill. NP Doyne Keel will refer her back for SA Counseling

## 2022-11-30 NOTE — Telephone Encounter (Signed)
Patient is requesting baclofen.  Provider does not fill/order baclofen or muscle relaxers.  Patient referred to community health and wellness for primary care.

## 2022-12-05 ENCOUNTER — Ambulatory Visit (INDEPENDENT_AMBULATORY_CARE_PROVIDER_SITE_OTHER): Payer: Medicaid Other | Admitting: Mental Health

## 2022-12-05 DIAGNOSIS — F4310 Post-traumatic stress disorder, unspecified: Secondary | ICD-10-CM

## 2022-12-05 DIAGNOSIS — F322 Major depressive disorder, single episode, severe without psychotic features: Secondary | ICD-10-CM | POA: Diagnosis not present

## 2022-12-05 DIAGNOSIS — F1020 Alcohol dependence, uncomplicated: Secondary | ICD-10-CM

## 2022-12-05 NOTE — Progress Notes (Signed)
THERAPIST PROGRESS NOTE Virtual Visit via Video Note  I connected with Maureen Torres on 12/05/22 at  9:00 AM EDT by a video enabled telemedicine application and verified that I am speaking with the correct person using two identifiers.  Location: Patient: home address on file Provider: home office   I discussed the limitations of evaluation and management by telemedicine and the availability of in person appointments. The patient expressed understanding and agreed to proceed.  I discussed the assessment and treatment plan with the patient. The patient was provided an opportunity to ask questions and all were answered. The patient agreed with the plan and demonstrated an understanding of the instructions.   The patient was advised to call back or seek an in-person evaluation if the symptoms worsen or if the condition fails to improve as anticipated.  I provided 29 minutes of non-face-to-face time during this encounter.   Dorris Singh, [facility]   Session Time: 9:02 ( 29 minutes)  Participation Level: Active  Behavioral Response: Fairly GroomedLethargicAdequate/Neutral  Type of Therapy: Individual Therapy  Treatment Goals addressed:  LTG: Darriona will improve quality of life by maintaining ongoing abstinence from all mood-altering substances   LTG: Increase coping skills to manage depression and improve ability to perform daily activities    ProgressTowards Goals: Progressing  Interventions: Supportive  Summary: Maureen Torres is a 37 y.o. female who presents with dx of major depression, single episode severe, PTSD and alcohol use severe dependence. Shares with therapist to have presented to CDIOP approximately x 1 month and to have found this service beneficial however attendance became an additional stressor for her with attendance to group, AA and finding a sponsor along with work to have become overwhelming for her and posing as an additional stressors. Notes hx of  drinking daily with current use a few times a week, approximately x 2 days of one glass of wine. Shares concerns for depression and trauma to be contributors of drinking behaviors with presence of nightmares. Reports thoughts on coping currently with excessive work sharing to work upwards of 80 hours a week. " I think work has replaced the drinking." Shares presence of journaling as means of self-care. Appetite reported to be poor. Shares desire to work on substance use as well as mental health concerns. Notes to have liked CDIOP therapist and agrees to follow up with individual therapy with Remigio Eisenmenger LMFT LCAS. Denies safety concerns.   Suicidal/Homicidal: Nowithout intent/plan  Therapist Response: Therapist engaged [place] in therapy session. Assessed for current location and ability to hold secure confidential session. Reviewed hx of treatment with [facility] and assessed for current concerns. Explored factors contributing to ceasing CDIOP as well as factors that have supported in reducing usage in absence of CDIOP for the past month. Explored ways in which alcohol has effected her relationships and health. Explored presence of mental health related concerns. Supported in processing thoughts and feelings in regards to SA and MH concerns and working to explore ability to obtain balance in daily life and working to build a life and understanding of who she is without substances. Educated on ability to follow up with previous therapist individually to support in navigating SA and MH concerns. Reviewed session and educated on front desk contacting to schedule. No safety concerns reported.   Plan: Return again in  x 2 weeks.  Diagnosis: Major depressive disorder, single episode, severe (HCC)  PTSD (post-traumatic stress disorder)  Alcohol use disorder, severe, dependence (HCC)  Collaboration of Care: Other  provider involved in patient's care AEB Referred for individuals with Remigio Eisenmenger LMFT,  LCAS  Patient/Guardian was advised Release of Information must be obtained prior to any record release in order to collaborate their care with an outside provider. Patient/Guardian was advised if they have not already done so to contact the registration department to sign all necessary forms in order for [place] to release information regarding their care.   Consent: Patient/Guardian gives verbal consent for treatment and assignment of benefits for services provided during this visit. Patient/Guardian expressed understanding and agreed to proceed.   Stephan Minister [place], [facility] 12/05/2022

## 2022-12-14 ENCOUNTER — Ambulatory Visit (HOSPITAL_COMMUNITY): Payer: Medicaid Other | Admitting: Licensed Clinical Social Worker

## 2022-12-19 ENCOUNTER — Other Ambulatory Visit (HOSPITAL_COMMUNITY): Payer: Self-pay | Admitting: Psychiatry

## 2022-12-19 DIAGNOSIS — F411 Generalized anxiety disorder: Secondary | ICD-10-CM

## 2022-12-19 DIAGNOSIS — F331 Major depressive disorder, recurrent, moderate: Secondary | ICD-10-CM

## 2022-12-19 DIAGNOSIS — F1094 Alcohol use, unspecified with alcohol-induced mood disorder: Secondary | ICD-10-CM

## 2023-01-04 ENCOUNTER — Ambulatory Visit (HOSPITAL_COMMUNITY): Payer: Medicaid Other | Admitting: Licensed Clinical Social Worker

## 2023-01-10 ENCOUNTER — Ambulatory Visit (HOSPITAL_COMMUNITY): Payer: Medicaid Other | Admitting: Licensed Clinical Social Worker

## 2023-02-01 ENCOUNTER — Ambulatory Visit (HOSPITAL_COMMUNITY): Payer: Medicaid Other | Admitting: Mental Health

## 2023-02-03 ENCOUNTER — Encounter (HOSPITAL_COMMUNITY): Payer: Self-pay | Admitting: Psychiatry

## 2023-02-03 ENCOUNTER — Other Ambulatory Visit (HOSPITAL_COMMUNITY): Payer: Self-pay | Admitting: Psychiatry

## 2023-02-03 ENCOUNTER — Telehealth (HOSPITAL_COMMUNITY): Payer: MEDICAID | Admitting: Psychiatry

## 2023-02-03 ENCOUNTER — Telehealth (INDEPENDENT_AMBULATORY_CARE_PROVIDER_SITE_OTHER): Payer: MEDICAID | Admitting: Psychiatry

## 2023-02-03 ENCOUNTER — Encounter (HOSPITAL_COMMUNITY): Payer: Self-pay

## 2023-02-03 DIAGNOSIS — F331 Major depressive disorder, recurrent, moderate: Secondary | ICD-10-CM | POA: Diagnosis not present

## 2023-02-03 DIAGNOSIS — Z0000 Encounter for general adult medical examination without abnormal findings: Secondary | ICD-10-CM

## 2023-02-03 DIAGNOSIS — F411 Generalized anxiety disorder: Secondary | ICD-10-CM

## 2023-02-03 DIAGNOSIS — F17200 Nicotine dependence, unspecified, uncomplicated: Secondary | ICD-10-CM

## 2023-02-03 DIAGNOSIS — F17210 Nicotine dependence, cigarettes, uncomplicated: Secondary | ICD-10-CM

## 2023-02-03 DIAGNOSIS — F4310 Post-traumatic stress disorder, unspecified: Secondary | ICD-10-CM | POA: Diagnosis not present

## 2023-02-03 MED ORDER — NICOTINE 21 MG/24HR TD PT24: 21.0000 mg | MEDICATED_PATCH | TRANSDERMAL | 3 refills | Status: AC

## 2023-02-03 MED ORDER — HYDROXYZINE HCL 50 MG PO TABS: 50.0000 mg | ORAL_TABLET | Freq: Three times a day (TID) | ORAL | 2 refills | Status: AC | PRN

## 2023-02-03 MED ORDER — ESCITALOPRAM OXALATE 20 MG PO TABS: 20.0000 mg | ORAL_TABLET | Freq: Every day | ORAL | 3 refills | Status: DC

## 2023-02-03 MED ORDER — PRAZOSIN HCL 2 MG PO CAPS: ORAL_CAPSULE | ORAL | 3 refills | Status: AC

## 2023-02-03 MED ORDER — GABAPENTIN 300 MG PO CAPS: 300.0000 mg | ORAL_CAPSULE | Freq: Three times a day (TID) | ORAL | 3 refills | Status: AC

## 2023-02-03 MED ORDER — QUETIAPINE FUMARATE 150 MG PO TABS: 150.0000 mg | ORAL_TABLET | Freq: Every day | ORAL | 3 refills | Status: AC

## 2023-02-03 MED ORDER — BUPROPION HCL ER (XL) 150 MG PO TB24: 150.0000 mg | ORAL_TABLET | ORAL | 3 refills | Status: DC

## 2023-02-03 NOTE — Progress Notes (Signed)
BH MD/PA/NP OP Progress Note Virtual Visit via Video Note  I connected with Maureen Torres on 02/03/23 at 12:00 PM EST by a video enabled telemedicine application and verified that I am speaking with the correct person using two identifiers.  Location: Patient: Home Provider: Clinic   I discussed the limitations of evaluation and management by telemedicine and the availability of in person appointments. The patient expressed understanding and agreed to proceed.  I provided 30 minutes of non-face-to-face time during this encounter.   02/03/2023 10:19 AM Maureen Torres  MRN:  [id number]  Chief Complaint: "I think my baclofen is working and I am depressed because of my weight gain"  HPI: 37 year old female seen today for follow-up psychiatric evaluation.  She has a psychiatric history of depression, anxiety, alcohol induced mood disorder, insomnia, nicotine dependence, alcohol dependence, and SI.  Currently she is managed on trazodone 150 mg nightly as needed, Seroquel 100 mg nightly, prazosin 2 mg nightly, NicoDerm CQ 21 mg patches, hydroxyzine 50 mg 3 times daily as needed, gabapentin 300 mg 3 times daily, and Lexapro 20 mg.  She informed Clinical research associate that she discontinued trazodone and reports that her other medications are somewhat effective in managing her psychiatric issues.  Today she was well-groomed, pleasant, cooperative, and engaged in conversation.  She informed Clinical research associate that she believes her baclofen is working in preventing her relapse on alcohol.  Patient notes that she has been sober since September of alcohol.  She also notes that she no longer smokes tobacco.  Provider informed patient that she would be unable to refill baclofen.  Provider informed patient that naltrexone could be trialed pending a UDS.  She endorsed understanding and agreed.    Patient informed Clinical research associate that she finds Seroquel effective in managing her sleep and mood however notes that she has gained 30 pounds since her  last visit.  She informed Clinical research associate that when she was drinking she did not eat.  Provider informed patient that Seroquel does increase her appetite but also informed her that sustaining from alcohol may also increase her appetite.  She endorsed understanding and agreed.  Patient notes that she is concerned about her weight gain and notes that a lot of her family member thinks that she is 6 months pregnant.  Provider recommended patient following up with a nutrition to talk about her diet.  Provider also recommended patient following up with a PCP to discuss options for weight loss and her health.  She endorsed understanding and agreed. Patient reports that she would like to see a primary care doctor to address other concerns such as her joint pain.  Patient finds gabapentin somewhat effective for managing this pain which she quantifies as 6 out of 10.  Patient notes that the above exacerbates her anxiety and depression.  Today provider conducted a GAD-7 and patient scored a 16.  Provider also conducted PHQ-9 and patient scored a 13.  She endorses increased appetite.  Patient notes that her sleep is poor reporting that she sleeps 4 to 5 hours nightly.  At times she notes that she is irritable, distracted, and has racing thoughts.  She finds it difficult to concentrate on tasks.  Today patient is agreeable to increasing Seroquel 100 mg to 150 mg to help manage mood, anxiety, depression, and sleep.  She is also agreeable to starting Wellbutrin XL 150 mg to help manage depression, concentration, and potential weight loss.  She will was referred to a nutritionist and primary care.  At this time patient  does not wish to restart trazodone.  Potential side effects of medication and risks vs benefits of treatment vs non-treatment were explained and discussed. All questions were answered.  She will continue her other medication as prescribed.  No other concerns at this time.  Visit Diagnosis:    ICD-10-CM   1. Well adult  exam  Z00.00 Ambulatory referral to Nutrition and Diabetic Education    Ambulatory referral to Internal Medicine    2. GAD (generalized anxiety disorder)  F41.1 QUEtiapine 150 MG TABS    hydrOXYzine (ATARAX) 50 MG tablet    gabapentin (NEURONTIN) 300 MG capsule    escitalopram (LEXAPRO) 20 MG tablet    3. Moderate recurrent major depression (HCC)  F33.1 Lipid Profile    CBC w/Diff/Platelet    POCT Urine Drug Screen    Comprehensive Metabolic Panel (CMET)    HgB A1c    Thyroid Panel With TSH    QUEtiapine 150 MG TABS    escitalopram (LEXAPRO) 20 MG tablet    buPROPion (WELLBUTRIN XL) 150 MG 24 hr tablet    Hepatic function panel    4. PTSD (post-traumatic stress disorder)  F43.10 prazosin (MINIPRESS) 2 MG capsule    5. Tobacco dependence  F17.200 nicotine (NICODERM CQ - DOSED IN MG/24 HOURS) 21 mg/24hr patch    buPROPion (WELLBUTRIN XL) 150 MG 24 hr tablet      Past Psychiatric History: depression, anxiety, alcohol induced mood disorder, insomnia, nicotine dependence, alcohol dependence, and SI.    Past Medical History:  Past Medical History:  Diagnosis Date   Acid reflux     Past Surgical History:  Procedure Laterality Date   CHOLECYSTECTOMY     rous and y      Family Psychiatric History: Mother mood disorder   Family History:  Family History  Problem Relation Age of Onset   Hypertension Other    Pancreatitis Neg Hx     Social History:  Social History   Socioeconomic History   Marital status: Single    Spouse name: Not on file   Number of children: Not on file   Years of education: Not on file   Highest education level: Not on file  Occupational History   Not on file  Tobacco Use   Smoking status: Former    Types: Cigarettes   Smokeless tobacco: Never  Vaping Use   Vaping status: Every Day  Substance and Sexual Activity   Alcohol use: Yes    Alcohol/week: 4.0 standard drinks of alcohol    Types: 4 Glasses of wine per week   Drug use: Yes    Types:  Marijuana   Sexual activity: Yes  Other Topics Concern   Not on file  Social History Narrative   Not on file   Social Determinants of Health   Financial Resource Strain: Medium Risk (11/16/2022)   Overall Financial Resource Strain (CARDIA)    Difficulty of Paying Living Expenses: Somewhat hard  Food Insecurity: Food Insecurity Present (11/16/2022)   Hunger Vital Sign    Worried About Running Out of Food in the Last Year: Sometimes true    Ran Out of Food in the Last Year: Sometimes true  Transportation Needs: Unmet Transportation Needs (11/16/2022)   PRAPARE - Transportation    Lack of Transportation (Medical): No    Lack of Transportation (Non-Medical): Yes  Physical Activity: Sufficiently Active (11/16/2022)   Exercise Vital Sign    Days of Exercise per Week: 4 days    Minutes of  Exercise per Session: 120 min  Stress: Stress Concern Present (11/16/2022)   [employer] of Occupational Health - Occupational Stress Questionnaire    Feeling of Stress : Very much  Social Connections: Socially Isolated (11/16/2022)   Social Connection and Isolation Panel [NHANES]    Frequency of Communication with Friends and Family: Once a week    Frequency of Social Gatherings with Friends and Family: Never    Attends Religious Services: Never    Database administrator or Organizations: No    Attends Banker Meetings: Never    Marital Status: Living with partner    Allergies:  Allergies  Allergen Reactions   Aspirin Other (See Comments)    Stomach ulcer   Ibuprofen Other (See Comments)    Gastric Bypass--advised to avoid   Nsaids Other (See Comments)    Gastric bypass Hx gastric bypass     Metabolic Disorder Labs: Lab Results  Component Value Date   HGBA1C 4.6 (L) 09/17/2022   MPG 85.32 09/17/2022   No results found for: "PROLACTIN" Lab Results  Component Value Date   CHOL 200 09/17/2022   TRIG 78 09/17/2022   HDL 65 09/17/2022   CHOLHDL 3.1 09/17/2022   VLDL 16  09/17/2022   LDLCALC 119 (H) 09/17/2022   Lab Results  Component Value Date   TSH 0.867 09/10/2022    Therapeutic Level Labs: No results found for: "LITHIUM" No results found for: "VALPROATE" No results found for: "CBMZ"  Current Medications: Current Outpatient Medications  Medication Sig Dispense Refill   buPROPion (WELLBUTRIN XL) 150 MG 24 hr tablet Take 1 tablet (150 mg total) by mouth every morning. 30 tablet 3   acetaminophen (TYLENOL) 325 MG tablet Take 2 tablets (650 mg total) by mouth every 6 (six) hours as needed for mild pain.     baclofen (LIORESAL) 10 MG tablet Take 1 tablet (10 mg total) by mouth 3 (three) times daily. 90 tablet 2   escitalopram (LEXAPRO) 20 MG tablet Take 1 tablet (20 mg total) by mouth daily. 30 tablet 3   folic acid (FOLVITE) 1 MG tablet Take 1 tablet (1 mg total) by mouth daily.     gabapentin (NEURONTIN) 300 MG capsule Take 1 capsule (300 mg total) by mouth 3 (three) times daily. 90 capsule 3   hydrOXYzine (ATARAX) 50 MG tablet Take 1 tablet (50 mg total) by mouth 3 (three) times daily as needed for itching or anxiety. 270 tablet 2   medroxyPROGESTERone Acetate 150 MG/ML SUSY Inject 1 mL into the muscle every 3 (three) months.     nicotine (NICODERM CQ - DOSED IN MG/24 HOURS) 21 mg/24hr patch Place 1 patch (21 mg total) onto the skin daily. 28 patch 3   pantoprazole (PROTONIX) 40 MG tablet Take 1 tablet (40 mg total) by mouth daily. 30 tablet 0   prazosin (MINIPRESS) 2 MG capsule Take one tablet nightly 30 capsule 3   Probiotic Product (PROBIOTIC PO) Take 1 capsule by mouth daily.     QUEtiapine 150 MG TABS Take 150 mg by mouth at bedtime. 30 tablet 3   thiamine (VITAMIN B-1) 100 MG tablet Take 1 tablet (100 mg total) by mouth daily.     No current facility-administered medications for this visit.     Musculoskeletal: Strength & Muscle Tone: within normal limits and telehealth visit Gait & Station: normal, telehealth visit Patient leans:  N/A  Psychiatric Specialty Exam: Review of Systems  There were no vitals taken for this  visit.There is no height or weight on file to calculate BMI.  General Appearance: Well Groomed  Eye Contact:  Good  Speech:  Clear and Coherent and Normal Rate  Volume:  Normal  Mood:  Anxious and Depressed  Affect:  Appropriate and Congruent  Thought Process:  Coherent, Goal Directed, and Linear  Orientation:  Full (Time, Place, and Person)  Thought Content: Logical and Illogical   Suicidal Thoughts:  No  Homicidal Thoughts:  No  Memory:  Immediate;   Good Recent;   Good Remote;   Good  Judgement:  Good  Insight:  Good  Psychomotor Activity:  Normal  Concentration:  Concentration: Good and Attention Span: Good  Recall:  Good  Fund of Knowledge: Good  Language: Good  Akathisia:  No  Handed:  Left  AIMS (if indicated): not done  Assets:  Communication Skills Desire for Improvement Housing Leisure Time Physical Health Social Support Talents/Skills  ADL's:  Intact  Cognition: WNL  Sleep:  Poor   Screenings: AUDIT    Flowsheet Row Office Visit from 11/25/2022 in [facility]Dis[facility]  Alcohol Use Disorder Identification Test Final Score (AUDIT) 28 9      GAD-7    Flowsheet Row Video Visit from 02/03/2023 in [facility] Counselor from 11/16/2022 in [facility]  Total GAD-7 Score 16 15      PHQ2-9    Flowsheet Row Video Visit from 02/03/2023 in [facility] Counselor from 11/16/2022 in [facility]  PHQ-2 Total Score 2 4  PHQ-9 Total Score 13 16      Flowsheet Row Counselor from 11/16/2022 in [facility] Admission (Discharged) from [facility] to Hosp-Admission (Discharged) from 09/10/2022  in [facility] UN[facility]  C-SSRS RISK CATEGORY Moderate Risk High Risk High Risk        Assessment and Plan: Patient endorses increased anxiety, depression, and concerns about her weight gain.Today patient is agreeable to increasing Seroquel 100 mg to 150 mg to help manage mood, anxiety, depression, and sleep.  She is also agreeable to starting Wellbutrin XL 150 mg to help manage depression, concentration, and potential weight loss.  She will was referred to a nutritionist and primary care.  At this time patient does not wish to restart trazodone.  She will continue all other medications as prescribed. Provider ordered LFT, thyroid panel, lipid panel, HgbA1c, CBC, UDS, and thyroid panel 1. Well adult exam  - Ambulatory referral to Nutrition and Diabetic Education - Ambulatory referral to Internal Medicine  2. GAD (generalized anxiety disorder)  Increased- QUEtiapine 150 MG TABS; Take 150 mg by mouth at bedtime.  Dispense: 30 tablet; Refill: 3 Continue- hydrOXYzine (ATARAX) 50 MG tablet; Take 1 tablet (50 mg total) by mouth 3 (three) times daily as needed for itching or anxiety.  Dispense: 270 tablet; Refill: 2 Continue- gabapentin (NEURONTIN) 300 MG capsule; Take 1 capsule (300 mg total) by mouth 3 (three) times daily.  Dispense: 90 capsule; Refill: 3 Continue- escitalopram (LEXAPRO) 20 MG tablet; Take 1 tablet (20 mg total) by mouth daily.  Dispense: 30 tablet; Refill: 3  3. Moderate recurrent major depression (HCC)  -LFT - Lipid Profile - CBC w/Diff/Platelet - POCT Urine Drug Screen - Comprehensive Metabolic Panel (CMET) - HgB A1c - Thyroid Panel With TSH Increased- QUEtiapine  150 MG TABS; Take 150 mg by mouth at bedtime.  Dispense: 30 tablet; Refill: 3 Continue- escitalopram (LEXAPRO) 20 MG tablet; Take 1 tablet (20 mg total) by mouth daily.  Dispense: 30 tablet; Refill: 3 Start- buPROPion (WELLBUTRIN XL) 150 MG 24 hr tablet; Take 1 tablet (150 mg  total) by mouth every morning.  Dispense: 30 tablet; Refill: 3  4. PTSD (post-traumatic stress disorder)  Continue- prazosin (MINIPRESS) 2 MG capsule; Take one tablet nightly  Dispense: 30 capsule; Refill: 3  5. Tobacco dependence  Continue- nicotine (NICODERM CQ - DOSED IN MG/24 HOURS) 21 mg/24hr patch; Place 1 patch (21 mg total) onto the skin daily.  Dispense: 28 patch; Refill: 3 Start- buPROPion (WELLBUTRIN XL) 150 MG 24 hr tablet; Take 1 tablet (150 mg total) by mouth every morning.  Dispense: 30 tablet; Refill: 3   Collaboration of Care: Collaboration of Care: Other provider involved in patient's care AEB PCP, nutritionist, therapist  Patient/Guardian was advised Release of Information must be obtained prior to any record release in order to collaborate their care with an outside provider. Patient/Guardian was advised if they have not already done so to contact the registration department to sign all necessary forms in order for [place] to release information regarding their care.   Consent: Patient/Guardian gives verbal consent for treatment and assignment of benefits for services provided during this visit. Patient/Guardian expressed understanding and agreed to proceed.    Shanna Cisco, NP 02/03/2023, 10:19 AM

## 2023-02-25 ENCOUNTER — Other Ambulatory Visit (HOSPITAL_COMMUNITY): Payer: Self-pay | Admitting: Psychiatry

## 2023-02-25 DIAGNOSIS — F331 Major depressive disorder, recurrent, moderate: Secondary | ICD-10-CM

## 2023-02-25 DIAGNOSIS — F411 Generalized anxiety disorder: Secondary | ICD-10-CM

## 2023-02-25 DIAGNOSIS — F17200 Nicotine dependence, unspecified, uncomplicated: Secondary | ICD-10-CM

## 2023-04-13 ENCOUNTER — Telehealth (HOSPITAL_COMMUNITY): Payer: MEDICAID | Admitting: Psychiatry

## 2023-04-14 ENCOUNTER — Telehealth (HOSPITAL_COMMUNITY): Payer: MEDICAID | Admitting: Psychiatry

## 2023-04-14 ENCOUNTER — Encounter (HOSPITAL_COMMUNITY): Payer: Self-pay

## 2023-04-17 ENCOUNTER — Encounter: Payer: Self-pay | Admitting: Family

## 2023-04-17 ENCOUNTER — Telehealth: Payer: Self-pay

## 2023-04-17 NOTE — Telephone Encounter (Signed)
Called pt and left vm to call office back to rescheduled missed new patient appt

## 2023-04-17 NOTE — Progress Notes (Signed)
 Erroneous encounter-disregard

## 2023-06-26 ENCOUNTER — Telehealth (HOSPITAL_COMMUNITY): Payer: Self-pay

## 2023-06-26 NOTE — Telephone Encounter (Addendum)
 Hello,    Pt/ Pharmacy is requesting for a refill of Baclofen 10 MG tablets to be sent to pharmacy.   Last seen by provider: 02/03/2023.    Told Pharmacy that Patient needs to be seen by provider as she canceled and missed all APPS.

## 2023-06-27 NOTE — Telephone Encounter (Signed)
 Medication not filled by Clinical research associate. She needs to follow up for further assistance.

## 2023-10-25 ENCOUNTER — Ambulatory Visit: Admit: 2023-10-25 | Discharge: 2023-10-25 | Disposition: A | Discharge status: Home / Self Care | Payer: Self-pay

## 2023-10-25 ENCOUNTER — Ambulatory Visit
Admission: EM | Admit: 2023-10-25 | Discharge: 2023-10-25 | Disposition: A | Discharge status: Home / Self Care | Payer: Self-pay | Attending: Family Medicine | Admitting: Family Medicine

## 2023-10-25 ENCOUNTER — Encounter: Payer: Self-pay | Admitting: Emergency Medicine

## 2023-10-25 DIAGNOSIS — R600 Localized edema: Secondary | ICD-10-CM

## 2023-10-25 DIAGNOSIS — I83893 Varicose veins of bilateral lower extremities with other complications: Secondary | ICD-10-CM

## 2023-10-25 LAB — POCT URINE DIPSTICK
Bilirubin, UA: NEGATIVE
Blood, UA: NEGATIVE
Glucose, UA: NEGATIVE mg/dL
Ketones, POC UA: NEGATIVE mg/dL
Leukocytes, UA: NEGATIVE
Nitrite, UA: POSITIVE — AB
Protein Ur, POC: NEGATIVE mg/dL
Spec Grav, UA: 1.02 (ref 1.010–1.025)
Urobilinogen, UA: 2 U/dL — AB
pH, UA: 7 (ref 5.0–8.0)

## 2023-10-25 MED ORDER — FUROSEMIDE 20 MG PO TABS: ORAL_TABLET | ORAL | 0 refills | Status: AC

## 2023-10-25 NOTE — Discharge Instructions (Addendum)
 Swelling happens when fluid collects in small spaces around tissues and organs inside the body. Another word for swelling is edema. Some common parts of the body where people can have swelling are the lower legs or hands. This typically is worse in the areas of the body that are closest to the ground (because of gravity)  Symptoms of swelling can include puffiness of the skin, which can cause the skin to look stretched and shiny. This often occurs with swelling in the lower legs and can be worse after you sit or stand for a long time.  Treatment of edema includes several components: treatment of the underlying cause (if possible), reducing the amount of salt (sodium) in your diet, and, in many cases, use of a medication called a diuretic to eliminate excess fluid. Using compression stockings and elevating the legs may also be recommended.   You have had labs (blood tests) sent today. We will call you with any significant abnormalities or if there is need to begin or change treatment or pursue further follow up.  You may also review your test results online through MyChart. If you do not have a MyChart account, instructions to sign up should be on your discharge paperwork.

## 2023-10-25 NOTE — ED Triage Notes (Signed)
 Pt reports bilateral leg swelling and pain that started yesterday around 10am at work. 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 Pt has done RICE method with some improvement to swelling. Pain remains the same. Tender and warm to touch. Discoloration noted to both legs. Pt stated her legs looked shiny and taut when the swelling first started. Taken ibuprofen, tylenol , and gabapentin  with mild relief. Intermittent numbness and tingling present in both feet. Pain is 8/10 and does not radiate above knee. Pt reports burning sensation. Aggravated with any movement especially walking.

## 2023-10-26 LAB — CBC
Hematocrit: 32.8 % — ABNORMAL LOW (ref 34.0–46.6)
Hemoglobin: 10.2 g/dL — ABNORMAL LOW (ref 11.1–15.9)
MCH: 28.7 pg (ref 26.6–33.0)
MCHC: 31.1 g/dL — ABNORMAL LOW (ref 31.5–35.7)
MCV: 92 fL (ref 79–97)
Platelets: 256 x10E3/uL (ref 150–450)
RBC: 3.55 x10E6/uL — ABNORMAL LOW (ref 3.77–5.28)
RDW: 13.2 % (ref 11.7–15.4)
WBC: 4.5 x10E3/uL (ref 3.4–10.8)

## 2023-10-26 LAB — BRAIN NATRIURETIC PEPTIDE: BNP: 58.5 pg/mL (ref 0.0–100.0)

## 2023-10-26 LAB — COMPREHENSIVE METABOLIC PANEL WITH GFR
ALT: 17 IU/L (ref 0–32)
AST: 19 IU/L (ref 0–40)
Albumin: 3.9 g/dL (ref 3.9–4.9)
Alkaline Phosphatase: 131 IU/L — ABNORMAL HIGH (ref 44–121)
BUN/Creatinine Ratio: 10 (ref 9–23)
BUN: 7 mg/dL (ref 6–20)
Bilirubin Total: 0.3 mg/dL (ref 0.0–1.2)
CO2: 20 mmol/L (ref 20–29)
Calcium: 8.6 mg/dL — ABNORMAL LOW (ref 8.7–10.2)
Chloride: 104 mmol/L (ref 96–106)
Creatinine, Ser: 0.73 mg/dL (ref 0.57–1.00)
Globulin, Total: 2.8 g/dL (ref 1.5–4.5)
Glucose: 66 mg/dL — ABNORMAL LOW (ref 70–99)
Potassium: 4.4 mmol/L (ref 3.5–5.2)
Sodium: 139 mmol/L (ref 134–144)
Total Protein: 6.7 g/dL (ref 6.0–8.5)
eGFR: 107 mL/min/1.73 (ref >59)

## 2023-10-26 NOTE — ED Provider Notes (Signed)
 [facility]   [id number] 10/25/23 Arrival Time: 1103  ASSESSMENT & PLAN:  1. Bilateral lower extremity edema   2. Varicose veins of bilateral lower extremities with other complications    Unclear etiology. No susp for DVT.   Discharge Instructions      Swelling happens when fluid collects in small spaces around tissues and organs inside the body. Another word for swelling is edema. Some common parts of the body where people can have swelling are the lower legs or hands. This typically is worse in the areas of the body that are closest to the ground (because of gravity)  Symptoms of swelling can include puffiness of the skin, which can cause the skin to look stretched and shiny. This often occurs with swelling in the lower legs and can be worse after you sit or stand for a long time.  Treatment of edema includes several components: treatment of the underlying cause (if possible), reducing the amount of salt (sodium) in your diet, and, in many cases, use of a medication called a diuretic to eliminate excess fluid. Using compression stockings and elevating the legs may also be recommended.   You have had labs (blood tests) sent today. We will call you with any significant abnormalities or if there is need to begin or change treatment or pursue further follow up.  You may also review your test results online through MyChart. If you do not have a MyChart account, instructions to sign up should be on your discharge paperwork.     Trial of: Meds ordered this encounter  Medications   furosemide  (LASIX ) 20 MG tablet    Sig: Take 1-2 tablets in the morning as needed for leg swelling.    Dispense:  20 tablet    Refill:  0      Follow-up Information     [facility].   Specialty: Emergency Medicine Why: If symptoms worsen in any way. Contact information: [address] [place]  [phone]                 Reviewed expectations re: course of current medical issues. Questions answered. Outlined signs and symptoms indicating need for more acute intervention. Understanding verbalized. After Visit Summary given.   SUBJECTIVE: History from: Patient. Maureen Torres is a 38 y.o. female. Pt reports bilateral leg swelling and pain that started yesterday around 10am at work. 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 Pt has done RICE method with some improvement to swelling. Pain remains the same. Tender and warm to touch. Discoloration noted to both legs. Pt stated her legs looked shiny and taut when the swelling first started. Taken ibuprofen, tylenol , and gabapentin  with mild relief. Intermittent numbness and tingling present in both feet. Pain is 8/10 and does not radiate above knee. Pt reports burning sensation. Aggravated with any movement especially walking.   OBJECTIVE:  Vitals:   10/25/23 1204  BP: 98/61  Pulse: 75  Resp: 12  Temp: 97.9 F (36.6 C)  TempSrc: Oral  SpO2: 100%    General appearance: alert; no distress Eyes: PERRLA; EOMI; conjunctiva normal HENT: [place]; AT; without nasal congestion Neck: supple  Lungs: speaks full sentences without difficulty; unlabored Extremities: bilateral 1+ pitting edema Skin: warm and dry Neurologic: normal gait Psychological: alert and cooperative; normal mood and affect  Labs: Results for orders placed or performed during the hospital encounter of 10/25/23  POCT URINE DIPSTICK   Collection  Time: 10/25/23 12:48 PM  Result Value Ref Range   Color, UA straw (A) yellow   Clarity, UA cloudy (A) clear   Glucose, UA negative negative mg/dL   Bilirubin, UA negative negative   Ketones, POC UA negative negative mg/dL   Spec Grav, UA 8.979 8.989 - 1.025   Blood, UA negative negative   pH, UA 7.0 5.0 - 8.0   Protein Ur, POC negative negative mg/dL   Urobilinogen, UA 2.0 (A) 0.2 or 1.0 E.U./dL   Nitrite, UA  Positive (A) Negative   Leukocytes, UA Negative Negative   Labs Reviewed  POCT URINE DIPSTICK - Abnormal; Notable for the following components:      Result Value   Color, UA straw (*)    Clarity, UA cloudy (*)    Urobilinogen, UA 2.0 (*)    Nitrite, UA Positive (*)    All other components within normal limits  BRAIN NATRIURETIC PEPTIDE  CBC  COMPREHENSIVE METABOLIC PANEL WITH GFR    Imaging: No results found.  Allergies  Allergen Reactions   Aspirin Other (See Comments)    Stomach ulcer   Ibuprofen Other (See Comments)    Gastric Bypass--advised to avoid   Nsaids Other (See Comments)    Gastric bypass Hx gastric bypass     Past Medical History:  Diagnosis Date   Acid reflux    Social History   Socioeconomic History   Marital status: Single    Spouse name: Not on file   Number of children: Not on file   Years of education: Not on file   Highest education level: Not on file  Occupational History   Not on file  Tobacco Use   Smoking status: Former    Types: Cigarettes   Smokeless tobacco: Never  Vaping Use   Vaping status: Every Day   Substances: Nicotine , Flavoring  Substance and Sexual Activity   Alcohol  use: Not Currently    Alcohol /week: 4.0 standard drinks of alcohol     Types: 4 Glasses of wine per week   Drug use: Not Currently    Types: Marijuana   Sexual activity: Yes  Other Topics Concern   Not on file  Social History Narrative   Not on file   Social Drivers of Health   Financial Resource Strain: Medium Risk (11/16/2022)   Overall Financial Resource Strain (CARDIA)    Difficulty of Paying Living Expenses: Somewhat hard  Food Insecurity: Food Insecurity Present (11/16/2022)   Hunger Vital Sign    Worried About Running Out of Food in the Last Year: Sometimes true    Ran Out of Food in the Last Year: Sometimes true  Transportation Needs: Unmet Transportation Needs (11/16/2022)   PRAPARE - Transportation    Lack of Transportation (Medical): No     Lack of Transportation (Non-Medical): Yes  Physical Activity: Sufficiently Active (11/16/2022)   Exercise Vital Sign    Days of Exercise per Week: 4 days    Minutes of Exercise per Session: 120 min  Stress: Stress Concern Present (11/16/2022)   [employer] of Occupational Health - Occupational Stress Questionnaire    Feeling of Stress : Very much  Social Connections: Socially Isolated (11/16/2022)   Social Connection and Isolation Panel    Frequency of Communication with Friends and Family: Once a week    Frequency of Social Gatherings with Friends and Family: Never    Attends Religious Services: Never    Database administrator or Organizations: No    Attends [employer]  or Organization Meetings: Never    Marital Status: Living with partner  Intimate Partner Violence: Not At Risk (09/12/2022)   Humiliation, Afraid, Rape, and Kick questionnaire    Fear of Current or Ex-Partner: No    Emotionally Abused: No    Physically Abused: No    Sexually Abused: No   Family History  Problem Relation Age of Onset   Hypertension Other    Pancreatitis Neg Hx    Past Surgical History:  Procedure Laterality Date   CHOLECYSTECTOMY     rous and y       Rolinda Rogue, MD 10/26/23 [phone]

## 2023-10-27 ENCOUNTER — Ambulatory Visit: Payer: Self-pay
